# Patient Record
Sex: Female | Born: 1940
Health system: Southern US, Community
[De-identification: ages and names within clinical notes are randomized; demographics above are authoritative.]

## PROBLEM LIST (undated history)

## (undated) DIAGNOSIS — G43909 Migraine, unspecified, not intractable, without status migrainosus: Secondary | ICD-10-CM

## (undated) DIAGNOSIS — E039 Hypothyroidism, unspecified: Secondary | ICD-10-CM

## (undated) DIAGNOSIS — Z7901 Long term (current) use of anticoagulants: Secondary | ICD-10-CM

## (undated) DIAGNOSIS — F329 Major depressive disorder, single episode, unspecified: Secondary | ICD-10-CM

## (undated) DIAGNOSIS — K219 Gastro-esophageal reflux disease without esophagitis: Secondary | ICD-10-CM

## (undated) DIAGNOSIS — I351 Nonrheumatic aortic (valve) insufficiency: Secondary | ICD-10-CM

## (undated) DIAGNOSIS — F32A Depression, unspecified: Secondary | ICD-10-CM

## (undated) DIAGNOSIS — E669 Obesity, unspecified: Secondary | ICD-10-CM

## (undated) DIAGNOSIS — E785 Hyperlipidemia, unspecified: Secondary | ICD-10-CM

## (undated) DIAGNOSIS — Z9889 Other specified postprocedural states: Secondary | ICD-10-CM

## (undated) DIAGNOSIS — I5189 Other ill-defined heart diseases: Secondary | ICD-10-CM

## (undated) DIAGNOSIS — K635 Polyp of colon: Secondary | ICD-10-CM

## (undated) DIAGNOSIS — I10 Essential (primary) hypertension: Secondary | ICD-10-CM

## (undated) DIAGNOSIS — I480 Paroxysmal atrial fibrillation: Secondary | ICD-10-CM

## (undated) DIAGNOSIS — M1990 Unspecified osteoarthritis, unspecified site: Secondary | ICD-10-CM

## (undated) DIAGNOSIS — R296 Repeated falls: Secondary | ICD-10-CM

## (undated) HISTORY — DX: Obesity, unspecified: E66.9

## (undated) HISTORY — DX: Paroxysmal atrial fibrillation: I48.0

## (undated) HISTORY — DX: Hypothyroidism, unspecified: E03.9

## (undated) HISTORY — PX: TUBAL LIGATION: SHX77

## (undated) HISTORY — DX: Unspecified osteoarthritis, unspecified site: M19.90

## (undated) HISTORY — DX: Major depressive disorder, single episode, unspecified: F32.9

## (undated) HISTORY — DX: Polyp of colon: K63.5

## (undated) HISTORY — DX: Hyperlipidemia, unspecified: E78.5

## (undated) HISTORY — DX: Other specified postprocedural states: Z98.890

## (undated) HISTORY — DX: Gastro-esophageal reflux disease without esophagitis: K21.9

## (undated) HISTORY — DX: Other ill-defined heart diseases: I51.89

## (undated) HISTORY — PX: ABDOMINAL HYSTERECTOMY: SHX81

## (undated) HISTORY — DX: Nonrheumatic aortic (valve) insufficiency: I35.1

## (undated) HISTORY — DX: Long term (current) use of anticoagulants: Z79.01

## (undated) HISTORY — DX: Migraine, unspecified, not intractable, without status migrainosus: G43.909

## (undated) HISTORY — DX: Essential (primary) hypertension: I10

## (undated) HISTORY — DX: Depression, unspecified: F32.A

## (undated) HISTORY — PX: REPLACEMENT TOTAL KNEE: SUR1224

## (undated) HISTORY — PX: APPENDECTOMY: SHX54

---

## 1997-10-08 ENCOUNTER — Other Ambulatory Visit: Admission: RE | Admit: 1997-10-08 | Discharge: 1997-10-08 | Payer: Self-pay | Admitting: Obstetrics and Gynecology

## 1997-11-10 ENCOUNTER — Inpatient Hospital Stay (HOSPITAL_COMMUNITY): Admission: RE | Admit: 1997-11-10 | Discharge: 1997-11-13 | Payer: Self-pay | Admitting: Obstetrics and Gynecology

## 1998-04-30 ENCOUNTER — Other Ambulatory Visit: Admission: RE | Admit: 1998-04-30 | Discharge: 1998-04-30 | Payer: Self-pay | Admitting: Obstetrics and Gynecology

## 1998-08-25 ENCOUNTER — Other Ambulatory Visit: Admission: RE | Admit: 1998-08-25 | Discharge: 1998-08-25 | Payer: Self-pay | Admitting: Obstetrics and Gynecology

## 1998-11-12 ENCOUNTER — Other Ambulatory Visit: Admission: RE | Admit: 1998-11-12 | Discharge: 1998-11-12 | Payer: Self-pay | Admitting: Obstetrics and Gynecology

## 1999-03-10 ENCOUNTER — Other Ambulatory Visit: Admission: RE | Admit: 1999-03-10 | Discharge: 1999-03-10 | Payer: Self-pay | Admitting: Obstetrics and Gynecology

## 1999-07-21 ENCOUNTER — Other Ambulatory Visit: Admission: RE | Admit: 1999-07-21 | Discharge: 1999-07-21 | Payer: Self-pay | Admitting: Obstetrics and Gynecology

## 1999-11-18 ENCOUNTER — Other Ambulatory Visit: Admission: RE | Admit: 1999-11-18 | Discharge: 1999-11-18 | Payer: Self-pay | Admitting: Obstetrics and Gynecology

## 2000-05-31 ENCOUNTER — Other Ambulatory Visit: Admission: RE | Admit: 2000-05-31 | Discharge: 2000-05-31 | Payer: Self-pay | Admitting: Obstetrics and Gynecology

## 2000-06-13 ENCOUNTER — Ambulatory Visit (HOSPITAL_COMMUNITY): Admission: RE | Admit: 2000-06-13 | Discharge: 2000-06-13 | Payer: Self-pay | Admitting: Gastroenterology

## 2000-11-22 ENCOUNTER — Other Ambulatory Visit: Admission: RE | Admit: 2000-11-22 | Discharge: 2000-11-22 | Payer: Self-pay | Admitting: Obstetrics and Gynecology

## 2001-04-25 DIAGNOSIS — Z9889 Other specified postprocedural states: Secondary | ICD-10-CM

## 2001-04-25 HISTORY — DX: Other specified postprocedural states: Z98.890

## 2001-12-18 ENCOUNTER — Other Ambulatory Visit: Admission: RE | Admit: 2001-12-18 | Discharge: 2001-12-18 | Payer: Self-pay | Admitting: Obstetrics and Gynecology

## 2002-01-21 ENCOUNTER — Ambulatory Visit (HOSPITAL_COMMUNITY): Admission: RE | Admit: 2002-01-21 | Discharge: 2002-01-21 | Payer: Self-pay | Admitting: *Deleted

## 2003-01-02 ENCOUNTER — Other Ambulatory Visit: Admission: RE | Admit: 2003-01-02 | Discharge: 2003-01-02 | Payer: Self-pay | Admitting: Obstetrics and Gynecology

## 2005-04-25 HISTORY — PX: US ECHOCARDIOGRAPHY: HXRAD669

## 2005-09-06 ENCOUNTER — Ambulatory Visit: Payer: Self-pay | Admitting: Internal Medicine

## 2005-09-09 ENCOUNTER — Ambulatory Visit: Payer: Self-pay | Admitting: Internal Medicine

## 2005-09-16 ENCOUNTER — Ambulatory Visit: Payer: Self-pay | Admitting: Cardiology

## 2005-09-20 ENCOUNTER — Emergency Department (HOSPITAL_COMMUNITY): Admission: EM | Admit: 2005-09-20 | Discharge: 2005-09-20 | Payer: Self-pay | Admitting: Emergency Medicine

## 2005-09-21 ENCOUNTER — Ambulatory Visit (HOSPITAL_COMMUNITY): Admission: RE | Admit: 2005-09-21 | Discharge: 2005-09-21 | Payer: Self-pay | Admitting: Internal Medicine

## 2005-09-28 ENCOUNTER — Ambulatory Visit: Payer: Self-pay | Admitting: Gastroenterology

## 2005-10-12 ENCOUNTER — Ambulatory Visit: Payer: Self-pay | Admitting: Internal Medicine

## 2005-11-08 ENCOUNTER — Encounter (INDEPENDENT_AMBULATORY_CARE_PROVIDER_SITE_OTHER): Payer: Self-pay | Admitting: *Deleted

## 2005-11-08 ENCOUNTER — Ambulatory Visit: Payer: Self-pay | Admitting: Gastroenterology

## 2005-11-15 ENCOUNTER — Ambulatory Visit (HOSPITAL_BASED_OUTPATIENT_CLINIC_OR_DEPARTMENT_OTHER): Admission: RE | Admit: 2005-11-15 | Discharge: 2005-11-15 | Payer: Self-pay | Admitting: Otolaryngology

## 2005-11-15 ENCOUNTER — Encounter (INDEPENDENT_AMBULATORY_CARE_PROVIDER_SITE_OTHER): Payer: Self-pay | Admitting: *Deleted

## 2005-11-21 ENCOUNTER — Ambulatory Visit: Payer: Self-pay | Admitting: Internal Medicine

## 2005-11-25 ENCOUNTER — Ambulatory Visit: Payer: Self-pay | Admitting: Cardiology

## 2005-11-25 ENCOUNTER — Ambulatory Visit: Payer: Self-pay | Admitting: Internal Medicine

## 2005-12-01 ENCOUNTER — Ambulatory Visit: Payer: Self-pay | Admitting: Cardiology

## 2005-12-01 ENCOUNTER — Encounter: Payer: Self-pay | Admitting: Internal Medicine

## 2005-12-01 ENCOUNTER — Ambulatory Visit: Payer: Self-pay

## 2005-12-08 ENCOUNTER — Ambulatory Visit: Payer: Self-pay | Admitting: Cardiology

## 2005-12-08 ENCOUNTER — Ambulatory Visit: Payer: Self-pay | Admitting: Internal Medicine

## 2005-12-15 ENCOUNTER — Ambulatory Visit: Payer: Self-pay | Admitting: Cardiology

## 2005-12-21 ENCOUNTER — Ambulatory Visit: Payer: Self-pay | Admitting: Internal Medicine

## 2005-12-29 ENCOUNTER — Ambulatory Visit: Payer: Self-pay | Admitting: Cardiology

## 2006-01-11 ENCOUNTER — Ambulatory Visit: Payer: Self-pay | Admitting: Cardiology

## 2006-01-26 ENCOUNTER — Ambulatory Visit: Payer: Self-pay | Admitting: Cardiology

## 2006-02-09 ENCOUNTER — Ambulatory Visit: Payer: Self-pay | Admitting: Internal Medicine

## 2006-03-09 ENCOUNTER — Ambulatory Visit: Payer: Self-pay | Admitting: Cardiology

## 2006-03-22 ENCOUNTER — Ambulatory Visit: Payer: Self-pay | Admitting: Cardiovascular Disease

## 2006-04-21 ENCOUNTER — Ambulatory Visit: Payer: Self-pay | Admitting: Cardiology

## 2006-05-05 ENCOUNTER — Ambulatory Visit: Payer: Self-pay | Admitting: Internal Medicine

## 2006-05-26 ENCOUNTER — Ambulatory Visit: Payer: Self-pay | Admitting: Internal Medicine

## 2006-06-16 ENCOUNTER — Ambulatory Visit: Payer: Self-pay | Admitting: Internal Medicine

## 2006-06-22 ENCOUNTER — Ambulatory Visit: Payer: Self-pay | Admitting: Internal Medicine

## 2006-06-22 LAB — CONVERTED CEMR LAB
ALT: 32 units/L (ref 0–40)
AST: 30 units/L (ref 0–37)
Albumin: 3.8 g/dL (ref 3.5–5.2)
Alkaline Phosphatase: 92 units/L (ref 39–117)
BUN: 12 mg/dL (ref 6–23)
Basophils Absolute: 0 10*3/uL (ref 0.0–0.1)
Basophils Relative: 0.5 % (ref 0.0–1.0)
Bilirubin, Direct: 0.1 mg/dL (ref 0.0–0.3)
CO2: 30 meq/L (ref 19–32)
Cholesterol: 145 mg/dL (ref 0–200)
Creatinine, Ser: 0.8 mg/dL (ref 0.4–1.2)
Eosinophils Absolute: 0.2 10*3/uL (ref 0.0–0.6)
Eosinophils Relative: 1.9 % (ref 0.0–5.0)
GFR calc Af Amer: 92 mL/min
GFR calc non Af Amer: 76 mL/min
Glucose, Bld: 114 mg/dL — ABNORMAL HIGH (ref 70–99)
HCT: 37.8 % (ref 36.0–46.0)
HDL: 62.6 mg/dL (ref >39.0)
Hemoglobin: 13.1 g/dL (ref 12.0–15.0)
LDL Cholesterol: 65 mg/dL (ref 0–99)
Lymphocytes Relative: 7.1 % — ABNORMAL LOW (ref 12.0–46.0)
MCHC: 34.7 g/dL (ref 30.0–36.0)
MCV: 88.2 fL (ref 78.0–100.0)
Monocytes Absolute: 0.9 10*3/uL — ABNORMAL HIGH (ref 0.2–0.7)
Neutro Abs: 6.6 10*3/uL (ref 1.4–7.7)
Neutrophils Relative %: 80.2 % — ABNORMAL HIGH (ref 43.0–77.0)
Platelets: 333 10*3/uL (ref 150–400)
Potassium: 3.5 meq/L (ref 3.5–5.1)
RBC: 4.28 M/uL (ref 3.87–5.11)
RDW: 13.8 % (ref 11.5–14.6)
Sodium: 134 meq/L — ABNORMAL LOW (ref 135–145)
TSH: 1.37 microintl units/mL (ref 0.35–5.50)
Total Bilirubin: 0.6 mg/dL (ref 0.3–1.2)
Triglycerides: 85 mg/dL (ref 0–149)
VLDL: 17 mg/dL (ref 0–40)
WBC: 8.3 10*3/uL (ref 4.5–10.5)

## 2006-06-27 ENCOUNTER — Emergency Department (HOSPITAL_COMMUNITY): Admission: EM | Admit: 2006-06-27 | Discharge: 2006-06-27 | Payer: Self-pay | Admitting: Emergency Medicine

## 2006-06-29 ENCOUNTER — Encounter: Admission: RE | Admit: 2006-06-29 | Discharge: 2006-06-29 | Payer: Self-pay | Admitting: Orthopedic Surgery

## 2006-07-04 ENCOUNTER — Ambulatory Visit (HOSPITAL_BASED_OUTPATIENT_CLINIC_OR_DEPARTMENT_OTHER): Admission: RE | Admit: 2006-07-04 | Discharge: 2006-07-04 | Payer: Self-pay | Admitting: Orthopedic Surgery

## 2006-07-13 ENCOUNTER — Ambulatory Visit: Payer: Self-pay | Admitting: Cardiology

## 2006-07-14 ENCOUNTER — Encounter: Payer: Self-pay | Admitting: Vascular Surgery

## 2006-07-14 ENCOUNTER — Ambulatory Visit: Payer: Self-pay | Admitting: Vascular Surgery

## 2006-07-14 ENCOUNTER — Ambulatory Visit: Admission: RE | Admit: 2006-07-14 | Discharge: 2006-07-14 | Payer: Self-pay | Admitting: Orthopedic Surgery

## 2006-08-09 ENCOUNTER — Ambulatory Visit: Payer: Self-pay | Admitting: *Deleted

## 2006-08-23 ENCOUNTER — Ambulatory Visit: Payer: Self-pay | Admitting: Internal Medicine

## 2006-09-20 ENCOUNTER — Ambulatory Visit: Payer: Self-pay | Admitting: Cardiology

## 2006-09-27 ENCOUNTER — Ambulatory Visit (HOSPITAL_COMMUNITY): Admission: RE | Admit: 2006-09-27 | Discharge: 2006-09-27 | Payer: Self-pay | Admitting: Internal Medicine

## 2006-10-02 ENCOUNTER — Ambulatory Visit: Payer: Self-pay | Admitting: Internal Medicine

## 2006-10-17 ENCOUNTER — Ambulatory Visit: Payer: Self-pay | Admitting: Cardiology

## 2006-10-31 ENCOUNTER — Ambulatory Visit: Payer: Self-pay | Admitting: Cardiology

## 2006-11-17 ENCOUNTER — Ambulatory Visit: Payer: Self-pay | Admitting: Cardiology

## 2006-12-06 ENCOUNTER — Encounter: Payer: Self-pay | Admitting: Internal Medicine

## 2006-12-06 DIAGNOSIS — J309 Allergic rhinitis, unspecified: Secondary | ICD-10-CM

## 2006-12-06 DIAGNOSIS — J4520 Mild intermittent asthma, uncomplicated: Secondary | ICD-10-CM

## 2006-12-06 DIAGNOSIS — Z860100 Personal history of colon polyps, unspecified: Secondary | ICD-10-CM | POA: Insufficient documentation

## 2006-12-06 DIAGNOSIS — I10 Essential (primary) hypertension: Secondary | ICD-10-CM | POA: Insufficient documentation

## 2006-12-06 DIAGNOSIS — M1990 Unspecified osteoarthritis, unspecified site: Secondary | ICD-10-CM | POA: Insufficient documentation

## 2006-12-06 DIAGNOSIS — K219 Gastro-esophageal reflux disease without esophagitis: Secondary | ICD-10-CM | POA: Insufficient documentation

## 2006-12-06 DIAGNOSIS — F32A Depression, unspecified: Secondary | ICD-10-CM | POA: Insufficient documentation

## 2006-12-06 DIAGNOSIS — E039 Hypothyroidism, unspecified: Secondary | ICD-10-CM

## 2006-12-06 DIAGNOSIS — F329 Major depressive disorder, single episode, unspecified: Secondary | ICD-10-CM

## 2006-12-06 DIAGNOSIS — I359 Nonrheumatic aortic valve disorder, unspecified: Secondary | ICD-10-CM

## 2006-12-06 DIAGNOSIS — I4891 Unspecified atrial fibrillation: Secondary | ICD-10-CM | POA: Insufficient documentation

## 2006-12-13 ENCOUNTER — Ambulatory Visit: Payer: Self-pay | Admitting: Cardiology

## 2006-12-13 LAB — CONVERTED CEMR LAB
ALT: 21 units/L (ref 0–35)
AST: 22 units/L (ref 0–37)
Albumin: 3.6 g/dL (ref 3.5–5.2)
Alkaline Phosphatase: 95 units/L (ref 39–117)
BUN: 13 mg/dL (ref 6–23)
Basophils Absolute: 0.1 10*3/uL (ref 0.0–0.1)
Basophils Relative: 1.7 % — ABNORMAL HIGH (ref 0.0–1.0)
Bilirubin, Direct: 0.1 mg/dL (ref 0.0–0.3)
CO2: 30 meq/L (ref 19–32)
Chloride: 103 meq/L (ref 96–112)
Cholesterol: 160 mg/dL (ref 0–200)
Creatinine, Ser: 0.8 mg/dL (ref 0.4–1.2)
Eosinophils Absolute: 0.3 10*3/uL (ref 0.0–0.6)
Eosinophils Relative: 4.6 % (ref 0.0–5.0)
GFR calc Af Amer: 92 mL/min
GFR calc non Af Amer: 76 mL/min
Glucose, Bld: 91 mg/dL (ref 70–99)
HCT: 37.1 % (ref 36.0–46.0)
HDL: 61.5 mg/dL (ref >39.0)
Hemoglobin: 13 g/dL (ref 12.0–15.0)
LDL Cholesterol: 77 mg/dL (ref 0–99)
Lymphocytes Relative: 28.8 % (ref 12.0–46.0)
MCHC: 34.9 g/dL (ref 30.0–36.0)
MCV: 87.7 fL (ref 78.0–100.0)
Monocytes Absolute: 0.7 10*3/uL (ref 0.2–0.7)
Monocytes Relative: 9 % (ref 3.0–11.0)
Neutro Abs: 4.2 10*3/uL (ref 1.4–7.7)
Neutrophils Relative %: 55.9 % (ref 43.0–77.0)
Platelets: 355 10*3/uL (ref 150–400)
Potassium: 3.5 meq/L (ref 3.5–5.1)
RBC: 4.23 M/uL (ref 3.87–5.11)
Sodium: 139 meq/L (ref 135–145)
Total Bilirubin: 0.8 mg/dL (ref 0.3–1.2)
Total CHOL/HDL Ratio: 2.6
Total Protein: 6.9 g/dL (ref 6.0–8.3)
Triglycerides: 110 mg/dL (ref 0–149)
VLDL: 22 mg/dL (ref 0–40)
WBC: 7.5 10*3/uL (ref 4.5–10.5)

## 2007-01-10 ENCOUNTER — Ambulatory Visit: Payer: Self-pay | Admitting: Internal Medicine

## 2007-02-07 ENCOUNTER — Ambulatory Visit: Payer: Self-pay | Admitting: Cardiovascular Disease

## 2007-02-28 ENCOUNTER — Ambulatory Visit: Payer: Self-pay | Admitting: Cardiology

## 2007-03-12 ENCOUNTER — Encounter: Payer: Self-pay | Admitting: Internal Medicine

## 2007-03-28 ENCOUNTER — Ambulatory Visit: Payer: Self-pay | Admitting: Internal Medicine

## 2007-04-16 ENCOUNTER — Ambulatory Visit: Payer: Self-pay | Admitting: Cardiology

## 2007-05-07 ENCOUNTER — Ambulatory Visit: Payer: Self-pay | Admitting: Cardiology

## 2007-05-21 ENCOUNTER — Ambulatory Visit: Payer: Self-pay | Admitting: Cardiology

## 2007-05-24 ENCOUNTER — Encounter: Payer: Self-pay | Admitting: Internal Medicine

## 2007-06-01 ENCOUNTER — Encounter: Payer: Self-pay | Admitting: Internal Medicine

## 2007-06-07 ENCOUNTER — Encounter: Payer: Self-pay | Admitting: Internal Medicine

## 2007-06-13 ENCOUNTER — Ambulatory Visit: Payer: Self-pay | Admitting: Cardiology

## 2007-06-13 LAB — CONVERTED CEMR LAB
INR: 5.4 — ABNORMAL HIGH (ref 0.8–1.0)
Prothrombin Time: 30.1 s (ref 10.9–13.3)

## 2007-06-20 ENCOUNTER — Encounter: Payer: Self-pay | Admitting: Internal Medicine

## 2007-06-28 ENCOUNTER — Ambulatory Visit: Payer: Self-pay | Admitting: Cardiovascular Disease

## 2007-07-24 ENCOUNTER — Ambulatory Visit: Payer: Self-pay | Admitting: Internal Medicine

## 2007-08-01 ENCOUNTER — Ambulatory Visit: Payer: Self-pay | Admitting: Cardiovascular Disease

## 2007-08-15 ENCOUNTER — Ambulatory Visit: Payer: Self-pay | Admitting: Cardiology

## 2007-08-29 ENCOUNTER — Ambulatory Visit: Payer: Self-pay | Admitting: Internal Medicine

## 2007-08-29 ENCOUNTER — Encounter: Payer: Self-pay | Admitting: Internal Medicine

## 2007-08-30 ENCOUNTER — Ambulatory Visit: Payer: Self-pay | Admitting: Internal Medicine

## 2007-08-30 DIAGNOSIS — R5383 Other fatigue: Secondary | ICD-10-CM

## 2007-08-30 DIAGNOSIS — E785 Hyperlipidemia, unspecified: Secondary | ICD-10-CM | POA: Insufficient documentation

## 2007-08-30 DIAGNOSIS — R5381 Other malaise: Secondary | ICD-10-CM

## 2007-08-30 LAB — CONVERTED CEMR LAB
ALT: 30 U/L
AST: 25 U/L
Albumin: 3.8 g/dL
Alkaline Phosphatase: 79 U/L
BUN: 17 mg/dL
Basophils Absolute: 0 K/uL
Basophils Relative: 0 %
Bilirubin, Direct: 0.1 mg/dL
CO2: 29 meq/L
Calcium: 9.2 mg/dL
Chloride: 102 meq/L
Cholesterol: 172 mg/dL
Creatinine, Ser: 0.8 mg/dL
Eosinophils Absolute: 0 K/uL
Eosinophils Relative: 0.2 %
GFR calc Af Amer: 92 mL/min
GFR calc non Af Amer: 76 mL/min
Glucose, Bld: 112 mg/dL — ABNORMAL HIGH
HCT: 38.3 %
HDL: 68.7 mg/dL
Hemoglobin: 12.9 g/dL
LDL Cholesterol: 86 mg/dL
Lymphocytes Relative: 22.5 %
MCHC: 33.6 g/dL
MCV: 90 fL
Monocytes Absolute: 0.3 K/uL
Monocytes Relative: 2.2 % — ABNORMAL LOW
Neutro Abs: 8.6 K/uL — ABNORMAL HIGH
Neutrophils Relative %: 75.1 %
Platelets: 375 K/uL
Potassium: 3.6 meq/L
RBC: 4.25 M/uL
RDW: 14.3 %
Sodium: 138 meq/L
TSH: 2.64 u[IU]/mL
Total Bilirubin: 0.6 mg/dL
Total CHOL/HDL Ratio: 2.5
Total Protein: 7.1 g/dL
Triglycerides: 86 mg/dL
VLDL: 17 mg/dL
WBC: 11.5 10*3/microliter — ABNORMAL HIGH

## 2007-08-31 ENCOUNTER — Ambulatory Visit: Payer: Self-pay | Admitting: Internal Medicine

## 2007-08-31 ENCOUNTER — Encounter: Payer: Self-pay | Admitting: Internal Medicine

## 2007-09-03 ENCOUNTER — Ambulatory Visit: Payer: Self-pay | Admitting: Internal Medicine

## 2007-09-03 LAB — CONVERTED CEMR LAB
Basophils Relative: 0.7 % (ref 0.0–1.0)
Eosinophils Absolute: 0.3 10*3/uL (ref 0.0–0.7)
Eosinophils Relative: 2.4 % (ref 0.0–5.0)
HCT: 33.4 % — ABNORMAL LOW (ref 36.0–46.0)
Hemoglobin: 11.2 g/dL — ABNORMAL LOW (ref 12.0–15.0)
Lymphocytes Relative: 19.5 % (ref 12.0–46.0)
MCHC: 33.5 g/dL (ref 30.0–36.0)
MCV: 90.6 fL (ref 78.0–100.0)
Monocytes Absolute: 1.1 10*3/uL — ABNORMAL HIGH (ref 0.1–1.0)
Monocytes Relative: 9.6 % (ref 3.0–12.0)
Neutro Abs: 7.9 10*3/uL — ABNORMAL HIGH (ref 1.4–7.7)
Neutrophils Relative %: 67.8 % (ref 43.0–77.0)
RBC: 3.68 M/uL — ABNORMAL LOW (ref 3.87–5.11)
WBC: 11.7 10*3/uL — ABNORMAL HIGH (ref 4.5–10.5)

## 2007-09-05 ENCOUNTER — Telehealth (INDEPENDENT_AMBULATORY_CARE_PROVIDER_SITE_OTHER): Payer: Self-pay | Admitting: *Deleted

## 2007-09-12 ENCOUNTER — Ambulatory Visit: Payer: Self-pay | Admitting: Cardiology

## 2007-09-19 ENCOUNTER — Ambulatory Visit: Payer: Self-pay | Admitting: Cardiology

## 2007-09-24 ENCOUNTER — Ambulatory Visit: Payer: Self-pay | Admitting: Cardiovascular Disease

## 2007-09-27 ENCOUNTER — Telehealth: Payer: Self-pay | Admitting: Internal Medicine

## 2007-09-28 ENCOUNTER — Encounter: Payer: Self-pay | Admitting: Internal Medicine

## 2007-10-08 ENCOUNTER — Ambulatory Visit: Payer: Self-pay | Admitting: Internal Medicine

## 2007-10-09 ENCOUNTER — Encounter: Payer: Self-pay | Admitting: Internal Medicine

## 2007-10-22 ENCOUNTER — Ambulatory Visit: Payer: Self-pay | Admitting: Cardiology

## 2007-10-31 ENCOUNTER — Ambulatory Visit: Payer: Self-pay | Admitting: Cardiology

## 2007-11-05 ENCOUNTER — Ambulatory Visit: Payer: Self-pay | Admitting: Cardiology

## 2007-11-05 LAB — CONVERTED CEMR LAB
INR: 5.2 — ABNORMAL HIGH (ref 0.8–1.0)
Prothrombin Time: 50.9 s — ABNORMAL HIGH (ref 10.9–13.3)

## 2007-11-16 ENCOUNTER — Inpatient Hospital Stay (HOSPITAL_COMMUNITY): Admission: RE | Admit: 2007-11-16 | Discharge: 2007-11-20 | Payer: Self-pay | Admitting: Orthopedic Surgery

## 2007-11-21 ENCOUNTER — Telehealth (INDEPENDENT_AMBULATORY_CARE_PROVIDER_SITE_OTHER): Payer: Self-pay | Admitting: *Deleted

## 2007-11-21 ENCOUNTER — Ambulatory Visit: Payer: Self-pay | Admitting: Internal Medicine

## 2007-11-21 ENCOUNTER — Inpatient Hospital Stay (HOSPITAL_COMMUNITY): Admission: EM | Admit: 2007-11-21 | Discharge: 2007-11-25 | Payer: Self-pay | Admitting: Emergency Medicine

## 2007-11-29 ENCOUNTER — Encounter: Payer: Self-pay | Admitting: Internal Medicine

## 2007-11-30 ENCOUNTER — Ambulatory Visit: Payer: Self-pay | Admitting: Internal Medicine

## 2007-12-06 ENCOUNTER — Encounter: Payer: Self-pay | Admitting: Internal Medicine

## 2007-12-12 ENCOUNTER — Ambulatory Visit: Payer: Self-pay | Admitting: Cardiology

## 2007-12-21 ENCOUNTER — Ambulatory Visit: Payer: Self-pay | Admitting: Cardiology

## 2007-12-28 ENCOUNTER — Ambulatory Visit: Payer: Self-pay | Admitting: Cardiovascular Disease

## 2008-01-11 ENCOUNTER — Ambulatory Visit: Payer: Self-pay | Admitting: Cardiology

## 2008-01-15 ENCOUNTER — Telehealth: Payer: Self-pay | Admitting: Internal Medicine

## 2008-01-21 ENCOUNTER — Ambulatory Visit: Payer: Self-pay | Admitting: Internal Medicine

## 2008-01-30 ENCOUNTER — Ambulatory Visit: Payer: Self-pay | Admitting: Internal Medicine

## 2008-02-04 ENCOUNTER — Encounter: Payer: Self-pay | Admitting: Internal Medicine

## 2008-02-04 ENCOUNTER — Ambulatory Visit (HOSPITAL_COMMUNITY): Admission: RE | Admit: 2008-02-04 | Discharge: 2008-02-04 | Payer: Self-pay | Admitting: Internal Medicine

## 2008-02-06 ENCOUNTER — Encounter: Payer: Self-pay | Admitting: Internal Medicine

## 2008-02-18 ENCOUNTER — Ambulatory Visit: Payer: Self-pay | Admitting: Internal Medicine

## 2008-02-18 DIAGNOSIS — R609 Edema, unspecified: Secondary | ICD-10-CM

## 2008-02-22 ENCOUNTER — Encounter: Payer: Self-pay | Admitting: Internal Medicine

## 2008-02-22 LAB — CONVERTED CEMR LAB
AST: 24 units/L (ref 0–37)
Bacteria, UA: NEGATIVE
Basophils Absolute: 0.1 10*3/uL (ref 0.0–0.1)
Basophils Relative: 0.8 % (ref 0.0–3.0)
Bilirubin Urine: NEGATIVE
Bilirubin, Direct: 0.1 mg/dL (ref 0.0–0.3)
CO2: 30 meq/L (ref 19–32)
Calcium: 9.1 mg/dL (ref 8.4–10.5)
Cholesterol: 159 mg/dL (ref 0–200)
Creatinine, Ser: 0.7 mg/dL (ref 0.4–1.2)
Crystals: NEGATIVE
Eosinophils Relative: 5.9 % — ABNORMAL HIGH (ref 0.0–5.0)
GFR calc Af Amer: 107 mL/min
GFR calc non Af Amer: 88 mL/min
Glucose, Bld: 112 mg/dL — ABNORMAL HIGH (ref 70–99)
HDL: 59.5 mg/dL (ref >39.0)
Hemoglobin, Urine: NEGATIVE
Hemoglobin: 12.2 g/dL (ref 12.0–15.0)
Hgb A1c MFr Bld: 5.9 % (ref 4.6–6.0)
Ketones, ur: NEGATIVE mg/dL
LDL Cholesterol: 84 mg/dL (ref 0–99)
Leukocytes, UA: NEGATIVE
Lymphocytes Relative: 26.6 % (ref 12.0–46.0)
MCHC: 33.9 g/dL (ref 30.0–36.0)
MCV: 90 fL (ref 78.0–100.0)
Monocytes Absolute: 0.6 10*3/uL (ref 0.1–1.0)
Monocytes Relative: 7.7 % (ref 3.0–12.0)
Mucus, UA: NEGATIVE
Neutro Abs: 4.4 10*3/uL (ref 1.4–7.7)
Neutrophils Relative %: 59 % (ref 43.0–77.0)
Nitrite: NEGATIVE
Platelets: 345 10*3/uL (ref 150–400)
Potassium: 3.7 meq/L (ref 3.5–5.1)
RBC: 3.98 M/uL (ref 3.87–5.11)
RDW: 14.7 % — ABNORMAL HIGH (ref 11.5–14.6)
Sodium: 139 meq/L (ref 135–145)
Squamous Epithelial / HPF: NEGATIVE /lpf
TSH: 3.52 microintl units/mL (ref 0.35–5.50)
Total Bilirubin: 0.6 mg/dL (ref 0.3–1.2)
Total CHOL/HDL Ratio: 2.7
Total Protein, Urine: NEGATIVE mg/dL
Triglycerides: 79 mg/dL (ref 0–149)
Urine Glucose: NEGATIVE mg/dL
VLDL: 16 mg/dL (ref 0–40)
pH: 6.5 (ref 5.0–8.0)

## 2008-02-27 ENCOUNTER — Ambulatory Visit: Payer: Self-pay | Admitting: Cardiovascular Disease

## 2008-03-26 ENCOUNTER — Ambulatory Visit: Payer: Self-pay | Admitting: Internal Medicine

## 2008-04-23 ENCOUNTER — Ambulatory Visit: Payer: Self-pay | Admitting: Internal Medicine

## 2008-05-15 ENCOUNTER — Ambulatory Visit: Payer: Self-pay | Admitting: Cardiology

## 2008-05-21 ENCOUNTER — Ambulatory Visit: Payer: Self-pay | Admitting: Cardiology

## 2008-06-18 ENCOUNTER — Ambulatory Visit: Payer: Self-pay | Admitting: Cardiology

## 2008-07-08 ENCOUNTER — Ambulatory Visit: Payer: Self-pay | Admitting: Internal Medicine

## 2008-07-08 DIAGNOSIS — J0100 Acute maxillary sinusitis, unspecified: Secondary | ICD-10-CM

## 2008-07-08 LAB — CONVERTED CEMR LAB
ALT: 23 units/L (ref 0–35)
AST: 22 units/L (ref 0–37)
Albumin: 3.8 g/dL (ref 3.5–5.2)
Alkaline Phosphatase: 95 units/L (ref 39–117)
BUN: 15 mg/dL (ref 6–23)
Basophils Absolute: 0 10*3/uL (ref 0.0–0.1)
Basophils Relative: 0.2 % (ref 0.0–3.0)
Bilirubin Urine: NEGATIVE
Bilirubin, Direct: 0.1 mg/dL (ref 0.0–0.3)
CO2: 30 meq/L (ref 19–32)
Calcium: 9.3 mg/dL (ref 8.4–10.5)
Chloride: 102 meq/L (ref 96–112)
Cholesterol, target level: 200 mg/dL
Cholesterol: 174 mg/dL (ref 0–200)
Creatinine, Ser: 0.8 mg/dL (ref 0.4–1.2)
Eosinophils Absolute: 0.3 10*3/uL (ref 0.0–0.7)
Eosinophils Relative: 3.5 % (ref 0.0–5.0)
GFR calc non Af Amer: 75.41 mL/min (ref >60)
Glucose, Bld: 127 mg/dL — ABNORMAL HIGH (ref 70–99)
HCT: 37.1 % (ref 36.0–46.0)
HDL goal, serum: 40 mg/dL
HDL: 66.2 mg/dL (ref >39.00)
Hemoglobin, Urine: NEGATIVE
Hemoglobin: 12.7 g/dL (ref 12.0–15.0)
Ketones, ur: NEGATIVE mg/dL
LDL Cholesterol: 91 mg/dL (ref 0–99)
LDL Goal: 130 mg/dL
Leukocytes, UA: NEGATIVE
Lymphocytes Relative: 23.4 % (ref 12.0–46.0)
Lymphs Abs: 2 10*3/uL (ref 0.7–4.0)
MCHC: 34.2 g/dL (ref 30.0–36.0)
MCV: 89.8 fL (ref 78.0–100.0)
Magnesium: 2.3 mg/dL (ref 1.5–2.5)
Monocytes Absolute: 0.6 10*3/uL (ref 0.1–1.0)
Monocytes Relative: 6.6 % (ref 3.0–12.0)
Neutro Abs: 5.5 10*3/uL (ref 1.4–7.7)
Neutrophils Relative %: 66.3 % (ref 43.0–77.0)
Nitrite: NEGATIVE
Platelets: 335 10*3/uL (ref 150.0–400.0)
Potassium: 3.7 meq/L (ref 3.5–5.1)
RBC: 4.13 M/uL (ref 3.87–5.11)
RDW: 13.6 % (ref 11.5–14.6)
Sodium: 139 meq/L (ref 135–145)
Specific Gravity, Urine: 1.01 (ref 1.000–1.030)
TSH: 4.97 microintl units/mL (ref 0.35–5.50)
Total Bilirubin: 0.7 mg/dL (ref 0.3–1.2)
Total CHOL/HDL Ratio: 3
Total Protein, Urine: NEGATIVE mg/dL
Total Protein: 7.2 g/dL (ref 6.0–8.3)
Triglycerides: 84 mg/dL (ref 0.0–149.0)
Urine Glucose: NEGATIVE mg/dL
Urobilinogen, UA: 0.2 (ref 0.0–1.0)
VLDL: 16.8 mg/dL (ref 0.0–40.0)
WBC: 8.4 10*3/uL (ref 4.5–10.5)
pH: 7 (ref 5.0–8.0)

## 2008-07-09 ENCOUNTER — Encounter: Payer: Self-pay | Admitting: Internal Medicine

## 2008-07-16 ENCOUNTER — Ambulatory Visit: Payer: Self-pay | Admitting: Internal Medicine

## 2008-07-18 DIAGNOSIS — G43909 Migraine, unspecified, not intractable, without status migrainosus: Secondary | ICD-10-CM | POA: Insufficient documentation

## 2008-08-06 ENCOUNTER — Ambulatory Visit: Payer: Self-pay | Admitting: Internal Medicine

## 2008-08-20 ENCOUNTER — Ambulatory Visit: Payer: Self-pay | Admitting: Cardiology

## 2008-09-03 ENCOUNTER — Ambulatory Visit: Payer: Self-pay | Admitting: Cardiology

## 2008-09-10 ENCOUNTER — Ambulatory Visit: Payer: Self-pay | Admitting: Internal Medicine

## 2008-09-10 LAB — CONVERTED CEMR LAB
ALT: 22 units/L (ref 0–35)
Alkaline Phosphatase: 103 units/L (ref 39–117)
BUN: 15 mg/dL (ref 6–23)
Bilirubin Urine: NEGATIVE
Bilirubin, Direct: 0.1 mg/dL (ref 0.0–0.3)
CO2: 30 meq/L (ref 19–32)
Chloride: 107 meq/L (ref 96–112)
Creatinine, Ser: 0.8 mg/dL (ref 0.4–1.2)
Eosinophils Relative: 4.4 % (ref 0.0–5.0)
Glucose, Bld: 108 mg/dL — ABNORMAL HIGH (ref 70–99)
HCT: 37.4 % (ref 36.0–46.0)
Hemoglobin, Urine: NEGATIVE
Hemoglobin: 12.9 g/dL (ref 12.0–15.0)
Hgb A1c MFr Bld: 6.2 % (ref 4.6–6.5)
Leukocytes, UA: NEGATIVE
Lymphocytes Relative: 25.1 % (ref 12.0–46.0)
MCV: 88.8 fL (ref 78.0–100.0)
Monocytes Absolute: 0.4 10*3/uL (ref 0.1–1.0)
Monocytes Relative: 4.5 % (ref 3.0–12.0)
Neutrophils Relative %: 66 % (ref 43.0–77.0)
Nitrite: NEGATIVE
Platelets: 295 10*3/uL (ref 150.0–400.0)
Potassium: 3.6 meq/L (ref 3.5–5.1)
RBC: 4.21 M/uL (ref 3.87–5.11)
Sodium: 141 meq/L (ref 135–145)
Specific Gravity, Urine: <=1.005 (ref 1.000–1.030)
TSH: 3 microintl units/mL (ref 0.35–5.50)
Total Bilirubin: 0.5 mg/dL (ref 0.3–1.2)
Total Protein, Urine: NEGATIVE mg/dL
Total Protein: 7.3 g/dL (ref 6.0–8.3)
Urine Glucose: NEGATIVE mg/dL
Urobilinogen, UA: 0.2 (ref 0.0–1.0)
WBC: 8.5 10*3/uL (ref 4.5–10.5)
pH: 6 (ref 5.0–8.0)

## 2008-09-17 ENCOUNTER — Ambulatory Visit: Payer: Self-pay | Admitting: Internal Medicine

## 2008-09-17 LAB — CONVERTED CEMR LAB
INR: 5.4 (ref 0.8–1.0)
Prothrombin Time: 53 s (ref 10.9–13.3)

## 2008-09-23 ENCOUNTER — Encounter: Payer: Self-pay | Admitting: *Deleted

## 2008-10-01 ENCOUNTER — Telehealth: Payer: Self-pay | Admitting: Internal Medicine

## 2008-10-01 ENCOUNTER — Ambulatory Visit: Payer: Self-pay | Admitting: Cardiology

## 2008-10-01 LAB — CONVERTED CEMR LAB
POC INR: 2.2
Protime: 18.2

## 2008-10-22 ENCOUNTER — Encounter (INDEPENDENT_AMBULATORY_CARE_PROVIDER_SITE_OTHER): Payer: Self-pay | Admitting: Pharmacist

## 2008-10-22 ENCOUNTER — Ambulatory Visit: Payer: Self-pay | Admitting: Internal Medicine

## 2008-10-22 LAB — CONVERTED CEMR LAB: Prothrombin Time: 17.8 s

## 2008-10-29 ENCOUNTER — Encounter: Payer: Self-pay | Admitting: *Deleted

## 2008-11-19 ENCOUNTER — Ambulatory Visit: Payer: Self-pay | Admitting: Cardiology

## 2008-11-19 LAB — CONVERTED CEMR LAB
POC INR: 2.3
Prothrombin Time: 18.4 s

## 2008-11-24 ENCOUNTER — Ambulatory Visit: Payer: Self-pay | Admitting: Internal Medicine

## 2008-12-17 ENCOUNTER — Ambulatory Visit: Payer: Self-pay | Admitting: Internal Medicine

## 2008-12-17 LAB — CONVERTED CEMR LAB: POC INR: 3.9

## 2009-01-07 ENCOUNTER — Ambulatory Visit: Payer: Self-pay | Admitting: Internal Medicine

## 2009-01-07 LAB — CONVERTED CEMR LAB: POC INR: 2.6

## 2009-01-27 ENCOUNTER — Telehealth: Payer: Self-pay | Admitting: Internal Medicine

## 2009-02-04 ENCOUNTER — Ambulatory Visit: Payer: Self-pay | Admitting: Internal Medicine

## 2009-02-09 ENCOUNTER — Ambulatory Visit (HOSPITAL_COMMUNITY): Admission: RE | Admit: 2009-02-09 | Discharge: 2009-02-09 | Payer: Self-pay | Admitting: Internal Medicine

## 2009-03-04 ENCOUNTER — Ambulatory Visit: Payer: Self-pay | Admitting: Internal Medicine

## 2009-03-04 LAB — CONVERTED CEMR LAB: POC INR: 2.3

## 2009-04-01 ENCOUNTER — Ambulatory Visit: Payer: Self-pay | Admitting: Cardiology

## 2009-04-01 LAB — CONVERTED CEMR LAB: POC INR: 2.6

## 2009-04-29 ENCOUNTER — Ambulatory Visit: Payer: Self-pay | Admitting: Cardiovascular Disease

## 2009-05-19 ENCOUNTER — Encounter (INDEPENDENT_AMBULATORY_CARE_PROVIDER_SITE_OTHER): Payer: Self-pay | Admitting: *Deleted

## 2009-06-02 ENCOUNTER — Ambulatory Visit: Payer: Self-pay | Admitting: Cardiology

## 2009-06-18 ENCOUNTER — Ambulatory Visit: Payer: Self-pay | Admitting: Cardiology

## 2009-06-23 ENCOUNTER — Encounter (INDEPENDENT_AMBULATORY_CARE_PROVIDER_SITE_OTHER): Payer: Self-pay | Admitting: Cardiology

## 2009-06-23 ENCOUNTER — Ambulatory Visit: Payer: Self-pay | Admitting: Cardiology

## 2009-07-01 ENCOUNTER — Ambulatory Visit: Payer: Self-pay | Admitting: Internal Medicine

## 2009-07-01 LAB — CONVERTED CEMR LAB
ALT: 25 units/L (ref 0–35)
Albumin: 4 g/dL (ref 3.5–5.2)
BUN: 12 mg/dL (ref 6–23)
Basophils Relative: 0.6 % (ref 0.0–3.0)
Chloride: 106 meq/L (ref 96–112)
Cholesterol: 168 mg/dL (ref 0–200)
Eosinophils Relative: 5.3 % — ABNORMAL HIGH (ref 0.0–5.0)
HCT: 39.3 % (ref 36.0–46.0)
LDL Goal: 160 mg/dL
Lymphs Abs: 2 10*3/uL (ref 0.7–4.0)
MCV: 91.4 fL (ref 78.0–100.0)
Monocytes Absolute: 0.6 10*3/uL (ref 0.1–1.0)
Platelets: 302 10*3/uL (ref 150.0–400.0)
Potassium: 3.7 meq/L (ref 3.5–5.1)
RBC: 4.3 M/uL (ref 3.87–5.11)
TSH: 4.06 microintl units/mL (ref 0.35–5.50)
Total Protein: 7.7 g/dL (ref 6.0–8.3)
WBC: 8.7 10*3/uL (ref 4.5–10.5)

## 2009-07-02 ENCOUNTER — Encounter: Payer: Self-pay | Admitting: Internal Medicine

## 2009-07-21 ENCOUNTER — Ambulatory Visit: Payer: Self-pay | Admitting: Internal Medicine

## 2009-08-18 ENCOUNTER — Ambulatory Visit: Payer: Self-pay | Admitting: Cardiovascular Disease

## 2009-08-18 LAB — CONVERTED CEMR LAB: POC INR: 2.6

## 2009-09-15 ENCOUNTER — Ambulatory Visit: Payer: Self-pay | Admitting: Internal Medicine

## 2009-09-15 LAB — CONVERTED CEMR LAB: POC INR: 3

## 2009-10-13 ENCOUNTER — Ambulatory Visit: Payer: Self-pay | Admitting: Cardiology

## 2009-10-20 ENCOUNTER — Telehealth: Payer: Self-pay | Admitting: Internal Medicine

## 2009-11-10 ENCOUNTER — Ambulatory Visit: Payer: Self-pay | Admitting: Internal Medicine

## 2009-11-10 LAB — CONVERTED CEMR LAB: POC INR: 1.8

## 2009-12-08 ENCOUNTER — Ambulatory Visit: Payer: Self-pay | Admitting: Cardiology

## 2009-12-08 LAB — CONVERTED CEMR LAB: POC INR: 2

## 2010-01-06 ENCOUNTER — Ambulatory Visit: Payer: Self-pay | Admitting: Internal Medicine

## 2010-01-26 ENCOUNTER — Encounter: Payer: Self-pay | Admitting: Internal Medicine

## 2010-01-26 ENCOUNTER — Ambulatory Visit: Payer: Self-pay | Admitting: Internal Medicine

## 2010-02-02 ENCOUNTER — Ambulatory Visit: Payer: Self-pay | Admitting: Cardiology

## 2010-02-10 ENCOUNTER — Ambulatory Visit (HOSPITAL_COMMUNITY): Admission: RE | Admit: 2010-02-10 | Discharge: 2010-02-10 | Payer: Self-pay | Admitting: Internal Medicine

## 2010-02-10 LAB — HM MAMMOGRAPHY: HM Mammogram: NORMAL

## 2010-02-23 ENCOUNTER — Ambulatory Visit: Payer: Self-pay | Admitting: Cardiovascular Disease

## 2010-03-23 ENCOUNTER — Ambulatory Visit: Payer: Self-pay | Admitting: Cardiology

## 2010-04-20 ENCOUNTER — Ambulatory Visit: Payer: Self-pay | Admitting: Internal Medicine

## 2010-05-17 ENCOUNTER — Encounter: Payer: Self-pay | Admitting: Internal Medicine

## 2010-05-18 ENCOUNTER — Ambulatory Visit: Admission: RE | Admit: 2010-05-18 | Discharge: 2010-05-18 | Payer: Self-pay | Source: Home / Self Care

## 2010-05-27 NOTE — Medication Information (Signed)
Summary: [name] [name]   Anticoagulant Therapy  Managed by: Weston Brass, PharmD Referring MD: Olga Millers MD PCP: Etta Grandchild MD Supervising MD: Johney Frame MD, Fayrene Fearing Indication 1: Atrial Fibrillation (ICD-427.31) Lab Used: [facility] Site: [facility] INR POC 2.7 INR RANGE 2 - 3  Dietary changes: yes       Details: Less greens  Health status changes: no    Bleeding/hemorrhagic complications: no    Recent/future hospitalizations: no    Any changes in medication regimen? no    Recent/future dental: no  Any missed doses?: no       Is patient compliant with meds? yes       Allergies: 1)  ! Pcn 2)  ! Tetracycline 3)  ! Ceclor 4)  ! Zithromax 5)  ! Trovan 6)  ! Cephalexin 7)  ! * Clindamycin 8)  ! Ibuprofen 9)  ! * Simvastatin 10)  ! Pravachol 11)  ! Sulfa 12)  ! Levaquin (Levofloxacin)  Anticoagulation Management History:      The patient is taking warfarin and comes in today for a routine follow up visit.  Positive risk factors for bleeding include an age of 72 years or older.  Negative risk factors for bleeding include no history of CVA/TIA.  The bleeding index is 'intermediate risk'.  Positive CHADS2 values include History of HTN.  Negative CHADS2 values include Age > 59 years old, History of Diabetes, and Prior Stroke/CVA/TIA.  The start date was 11/15/2005.  Her last INR was 5.4 ratio.  Anticoagulation responsible Antania Hoefling: Allred MD, Fayrene Fearing.  INR POC: 2.7.  Cuvette Lot#: [id number].  Exp: 02/2011.    Anticoagulation Management Assessment/Plan:      The patient's current anticoagulation dose is Warfarin sodium 5 mg  tabs: Take as directed by coumadin clinic..  The target INR is 2 - 3.  The next INR is due 02/03/2010.  Anticoagulation instructions were given to patient.  Results were reviewed/authorized by Weston Brass, PharmD.  She was notified by Harrel Carina, PharmD candidate.         Prior Anticoagulation Instructions: INR 2.0  Continue taking 1.5 tablets (7.5mg )  every day except take 1 tablet (5mg ) on Mondays, Wednesdays, and Fridays.  Recheck in 4 weeks.   Current Anticoagulation Instructions: INR 2.7  Continue taking 1 1/2 tablets everyday except take 1 tablet on Mondays, Wednesdays, and Fridays. Re-check INR in 4 weeks. The patient is to hold four doses of coumadin.  The dosage to be resumed includes:

## 2010-05-27 NOTE — Progress Notes (Signed)
Summary: Refill--Omnaris  Phone Note Refill Request Message from:  Fax from Pharmacy on October 20, 2009 1:42 PM  Refills Requested: Medication #1:  OMNARIS 50 MCG/ACT SUSP 2 puffs each nostril Initial call taken by: Lucious Groves,  October 20, 2009 1:43 PM    Prescriptions: OMNARIS 50 MCG/ACT SUSP (CICLESONIDE) 2 puffs each nostril  #3 inhs x 0   Entered by:   Lucious Groves   Authorized by:   Etta Grandchild MD   Signed by:   Lucious Groves on 10/20/2009   Method used:   Faxed to ...       [facility]  [phone]* (retail)       [address] [place], [place]  [address]       Ph: [phone] or [phone]       Fax: [phone]   RxID:   [id number]

## 2010-05-27 NOTE — Letter (Signed)
Summary: Lipid Letter  [facility]  [address] [place], [place] [address]   Phone: [phone]  Fax: [phone]    07/02/2009  [name] [name] [address] [place], [place]  [address]  Dear Ms. Lozier-arps:  We have carefully reviewed your last lipid profile from 07/01/2009 and the results are noted below with a summary of recommendations for lipid management.    Cholesterol:       168     Goal: <200   HDL "good" Cholesterol:   96.29     Goal: >40   LDL "bad" Cholesterol:   76     Goal: <160   Triglycerides:       99.0     Goal: <150        TLC Diet (Therapeutic Lifestyle Change): Saturated Fats & Transfatty acids should be kept < 7% of total calories ***Reduce Saturated Fats Polyunstaurated Fat can be up to 10% of total calories Monounsaturated Fat Fat can be up to 20% of total calories Total Fat should be no greater than 25-35% of total calories Carbohydrates should be 50-60% of total calories Protein should be approximately 15% of total calories Fiber should be at least 20-30 grams a day ***Increased fiber may help lower LDL Total Cholesterol should be < 200mg /day Consider adding plant stanol/sterols to diet (example: Benacol spread) ***A higher intake of unsaturated fat may reduce Triglycerides and Increase HDL    Adjunctive Measures (may lower LIPIDS and reduce risk of Heart Attack) include: Aerobic Exercise (20-30 minutes 3-4 times a week) Limit Alcohol Consumption Weight Reduction Aspirin 75-81 mg a day by mouth (if not allergic or contraindicated) Dietary Fiber 20-30 grams a day by mouth     Current Medications: 1)    Cartia Xt 240 Mg  Cp24 (Diltiazem hcl coated beads) .Marland Kitchen.. 1 by mouth qd 2)    Advair Diskus 500-50 Mcg/dose  Misc (Fluticasone-salmeterol) .Marland Kitchen.. 1 puff bid 3)    Warfarin Sodium 5 Mg  Tabs (Warfarin sodium) .... Take as directed by coumadin clinic. 4)    Levothyroxine Sodium 25 Mcg Tabs (Levothyroxine sodium) .Marland Kitchen..  1 by mouth qd 5)    Nexium 40 Mg Cpdr (Esomeprazole magnesium) .... Take 1  by mouth bid 6)    Triamterene-hctz 37.5-25 Mg Caps (Triamterene-hctz) .Marland Kitchen.. 1 capsule by mouth once a day 7)    Glucosamine Chondr 1500 Complx   Caps (Glucosamine-chondroit-vit c-mn) .Marland Kitchen.. 1 by mouth bid 8)    Oscal 500/200 D-3 500-200 Mg-unit  Tabs (Calcium-vitamin d) .Marland Kitchen.. 1 by mouth bid 9)    Norco 5-325 Mg Tabs (Hydrocodone-acetaminophen) .Marland Kitchen.. 1 by mouth two times a day as needed for pain 10)    Astepro 0.15 % Soln (Azelastine hcl) .... Two times a day 11)    Crestor 10 Mg Tabs (Rosuvastatin calcium) .... One by mouth once daily 12)    Coenzyme Q10 200 Mg Caps (Coenzyme q10) .... Take 200 mg by mouth daily. 13)    Omnaris 50 Mcg/act Susp (Ciclesonide) .... 2 puffs each nostril 14)    Avelox 400 Mg Tabs (Moxifloxacin hcl) .... One by mouth once daily for 14 days 15)    Claritin 10 Mg Tabs (Loratadine) .... As needed 16)    Ventolin Hfa 108 (90 Base) Mcg/act Aers (Albuterol sulfate) .Marland Kitchen.. 1-2 puffs qid as needed for wheezing  If you have any questions, please call. We appreciate being able to work with you.   Sincerely,    [facility] Etta Grandchild MD

## 2010-05-27 NOTE — Medication Information (Signed)
Summary: [name] [name]   Anticoagulant Therapy  Managed by: Weston Brass, PharmD Referring MD: Olga Millers MD PCP: Etta Grandchild MD Supervising MD: Patty Sermons Indication 1: Atrial Fibrillation (ICD-427.31) Lab Used: [facility] Site: [facility] INR POC 2.5 INR RANGE 2 - 3  Dietary changes: no    Health status changes: yes       Details: Asthma flare typical for this time of year  Bleeding/hemorrhagic complications: no    Recent/future hospitalizations: no    Any changes in medication regimen? yes       Details: Robaxin for pain x2  Recent/future dental: no  Any missed doses?: no       Is patient compliant with meds? yes       Allergies: 1)  ! Pcn 2)  ! Tetracycline 3)  ! Ceclor 4)  ! Zithromax 5)  ! Trovan 6)  ! Cephalexin 7)  ! * Clindamycin 8)  ! Ibuprofen 9)  ! * Simvastatin 10)  ! Pravachol 11)  ! Sulfa 12)  ! Levaquin (Levofloxacin)  Anticoagulation Management History:      The patient is taking warfarin and comes in today for a routine follow up visit.  Positive risk factors for bleeding include an age of 56 years or older.  Negative risk factors for bleeding include no history of CVA/TIA.  The bleeding index is 'intermediate risk'.  Positive CHADS2 values include History of HTN.  Negative CHADS2 values include Age > 76 years old, History of Diabetes, and Prior Stroke/CVA/TIA.  The start date was 11/15/2005.  Her last INR was 5.4 ratio.  Anticoagulation responsible Kyel Purk: Brackbill.  INR POC: 2.5.  Cuvette Lot#: [id number].  Exp: 03/2011.    Anticoagulation Management Assessment/Plan:      The patient's current anticoagulation dose is Warfarin sodium 5 mg  tabs: Take as directed by coumadin clinic..  The target INR is 2 - 3.  The next INR is due 04/20/2010.  Anticoagulation instructions were given to patient.  Results were reviewed/authorized by Weston Brass, PharmD.  She was notified by Hoy Register, PharmD Candd[place].         Prior Anticoagulation  Instructions: INR 2.1  Continue on same dosage 1.5 tablets daily except 1 tablet on Mondays, Wednesdays, and Fridays.  Recheck in 4 weeks.    Current Anticoagulation Instructions: INR 2.5 Continue previous dose of 7.5 mg everyday except 5 mg on Monday, Wednesday, and Friday Recheck INR in 4 weeks

## 2010-05-27 NOTE — Medication Information (Signed)
Summary: ccr/jss  Anticoagulant Therapy  Managed by: Cloyde Reams, RN, BSN Referring MD: Olga Millers MD PCP: Etta Grandchild MD Supervising MD: Riley Kill MD, Maisie Fus Indication 1: Atrial Fibrillation (ICD-427.31) Lab Used: [facility] Site: [facility] INR POC 3.2 INR RANGE 2 - 3  Dietary changes: no    Health status changes: no    Bleeding/hemorrhagic complications: no    Recent/future hospitalizations: no    Any changes in medication regimen? no    Recent/future dental: no  Any missed doses?: no       Is patient compliant with meds? yes       Allergies: 1)  ! Pcn 2)  ! Tetracycline 3)  ! Ceclor 4)  ! Zithromax 5)  ! Trovan 6)  ! Cephalexin 7)  ! * Clindamycin 8)  ! Ibuprofen 9)  ! * Simvastatin 10)  ! Pravachol 11)  ! Sulfa  Anticoagulation Management History:      The patient is taking warfarin and comes in today for a routine follow up visit.  Positive risk factors for bleeding include an age of 72 years or older.  Negative risk factors for bleeding include no history of CVA/TIA.  The bleeding index is 'intermediate risk'.  Positive CHADS2 values include History of HTN.  Negative CHADS2 values include Age > 44 years old, History of Diabetes, and Prior Stroke/CVA/TIA.  The start date was 11/15/2005.  Her last INR was 5.4 ratio.  Anticoagulation responsible provider: Riley Kill MD, Maisie Fus.  INR POC: 3.2.  Cuvette Lot#: [id number].  Exp: 07/2010.    Anticoagulation Management Assessment/Plan:      The patient's current anticoagulation dose is Warfarin sodium 5 mg  tabs: Take as directed by coumadin clinic..  The target INR is 2 - 3.  The next INR is due 06/23/2009.  Anticoagulation instructions were given to patient.  Results were reviewed/authorized by Cloyde Reams, RN, BSN.  She was notified by Bethena Midget, RN, BSN.         Prior Anticoagulation Instructions: INR 3.0  Take 1 tablet tonight only then continue taking 1.5 tablets daily except 1 tablet Mondays and  Fridays. Recheck in 4 weeks.  Current Anticoagulation Instructions: INR 3.2  Start taking 1.5 tablets daily except 1 tablet on Mondays, Wednesdays, and Fridays.  Recheck in 3 weeks.

## 2010-05-27 NOTE — Medication Information (Signed)
Summary: [name] [name]  Anticoagulant Therapy  Managed by: Weston Brass, PharmD Referring MD: Olga Millers MD PCP: Etta Grandchild MD Supervising MD: Graciela Husbands MD, Viviann Spare Indication 1: Atrial Fibrillation (ICD-427.31) Lab Used: [facility] Site: [facility] INR POC 2.6 INR RANGE 2 - 3  Dietary changes: no    Health status changes: no    Bleeding/hemorrhagic complications: no    Recent/future hospitalizations: no    Any changes in medication regimen? yes       Details: Robutussin CF at least once daily   Recent/future dental: no  Any missed doses?: no       Is patient compliant with meds? yes       Allergies: 1)  ! Pcn 2)  ! Tetracycline 3)  ! Ceclor 4)  ! Zithromax 5)  ! Trovan 6)  ! Cephalexin 7)  ! * Clindamycin 8)  ! Ibuprofen 9)  ! * Simvastatin 10)  ! Pravachol 11)  ! Sulfa 12)  ! Levaquin (Levofloxacin)  Anticoagulation Management History:      The patient is taking warfarin and comes in today for a routine follow up visit.  Positive risk factors for bleeding include an age of 5 years or older.  Negative risk factors for bleeding include no history of CVA/TIA.  The bleeding index is 'intermediate risk'.  Positive CHADS2 values include History of HTN.  Negative CHADS2 values include Age > 46 years old, History of Diabetes, and Prior Stroke/CVA/TIA.  The start date was 11/15/2005.  Her last INR was 5.4 ratio.  Anticoagulation responsible provider: Graciela Husbands MD, Viviann Spare.  INR POC: 2.6.  Cuvette Lot#: [id number].  Exp: 04/2011.    Anticoagulation Management Assessment/Plan:      The patient's current anticoagulation dose is Warfarin sodium 5 mg  tabs: Take as directed by coumadin clinic..  The target INR is 2 - 3.  The next INR is due 06/15/2010.  Anticoagulation instructions were given to patient.  Results were reviewed/authorized by Weston Brass, PharmD.  She was notified by Stephannie Peters, PharmD Candidate .         Prior Anticoagulation Instructions: Cont with current  regimen Return to clinic on Jan 24th, at 1000  Current Anticoagulation Instructions: INR 2.6  Coumadin 5 mg tablets - Continue 1.5 tablets every day except 1 tablet on Mondays, Wednesdays and Fridays

## 2010-05-27 NOTE — Letter (Signed)
Summary: Results Follow-up Letter  [facility]  [address] [place], [place] [address]   Phone: [phone]  Fax: [phone]    07/02/2009  [address] [place], [place]  [address]  Dear Ms. LOZIER-ARPS,   The following are the results of your recent test(s):  Test     Result     Kidney/liver   normal CBC       normal Thyroid     normal Blood sugar     103   _________________________________________________________  Please call for an appointment as directed _________________________________________________________ _________________________________________________________ _________________________________________________________  Sincerely,  Sanda Linger MD [facility]

## 2010-05-27 NOTE — Medication Information (Signed)
Summary: rov/ewj   Anticoagulant Therapy  Managed by: Weston Brass, PharmD Referring MD: Olga Millers MD PCP: Etta Grandchild MD Supervising MD: Myrtis Ser MD, Tinnie Gens Indication 1: Atrial Fibrillation (ICD-427.31) Lab Used: [facility] Site: [facility] INR POC 2.0 INR RANGE 2 - 3  Dietary changes: no    Health status changes: no    Bleeding/hemorrhagic complications: no    Recent/future hospitalizations: no    Any changes in medication regimen? no    Recent/future dental: no  Any missed doses?: no       Is patient compliant with meds? yes       Allergies: 1)  ! Pcn 2)  ! Tetracycline 3)  ! Ceclor 4)  ! Zithromax 5)  ! Trovan 6)  ! Cephalexin 7)  ! * Clindamycin 8)  ! Ibuprofen 9)  ! * Simvastatin 10)  ! Pravachol 11)  ! Sulfa 12)  ! Levaquin (Levofloxacin)  Anticoagulation Management History:      The patient is taking warfarin and comes in today for a routine follow up visit.  Positive risk factors for bleeding include an age of 72 years or older.  Negative risk factors for bleeding include no history of CVA/TIA.  The bleeding index is 'intermediate risk'.  Positive CHADS2 values include History of HTN.  Negative CHADS2 values include Age > 61 years old, History of Diabetes, and Prior Stroke/CVA/TIA.  The start date was 11/15/2005.  Her last INR was 5.4 ratio.  Anticoagulation responsible Claborn Janusz: Myrtis Ser MD, Tinnie Gens.  INR POC: 2.0.  Cuvette Lot#: [id number].  Exp: 01/2011.    Anticoagulation Management Assessment/Plan:      The patient's current anticoagulation dose is Warfarin sodium 5 mg  tabs: Take as directed by coumadin clinic..  The target INR is 2 - 3.  The next INR is due 01/06/2010.  Anticoagulation instructions were given to patient.  Results were reviewed/authorized by Weston Brass, PharmD.  She was notified by Gweneth Fritter, PharmD Candidate.         Prior Anticoagulation Instructions: INR 1.8  Take 10mg  today, then resume same dosage 7.5mg  daily except 5mg  on  MOndays, Wednesdays, and Fridays.  Recheck in 3-4 weeks.     Current Anticoagulation Instructions: INR 2.0  Continue taking 1.5 tablets (7.5mg ) every day except take 1 tablet (5mg ) on Mondays, Wednesdays, and Fridays.  Recheck in 4 weeks.

## 2010-05-27 NOTE — Medication Information (Signed)
Summary: [email or web address].mp  Anticoagulant Therapy  Managed by: Cloyde Reams, RN, BSN Referring MD: Olga Millers MD PCP: Etta Grandchild MD Supervising MD: Gala Romney MD, Reuel Boom Indication 1: Atrial Fibrillation (ICD-427.31) Lab Used: [facility] Site: [facility] INR POC 2.3 INR RANGE 2 - 3  Dietary changes: no    Health status changes: no    Bleeding/hemorrhagic complications: yes       Details: Scant amt of hemmroidal bleeding.    Recent/future hospitalizations: no    Any changes in medication regimen? no    Recent/future dental: no  Any missed doses?: no       Is patient compliant with meds? yes       Allergies (verified): 1)  ! Pcn 2)  ! Tetracycline 3)  ! Ceclor 4)  ! Zithromax 5)  ! Trovan 6)  ! Cephalexin 7)  ! * Clindamycin 8)  ! Ibuprofen 9)  ! * Simvastatin 10)  ! Pravachol 11)  ! Sulfa 12)  ! Levaquin (Levofloxacin)  Anticoagulation Management History:      The patient is taking warfarin and comes in today for a routine follow up visit.  Positive risk factors for bleeding include an age of 72 years or older.  Negative risk factors for bleeding include no history of CVA/TIA.  The bleeding index is 'intermediate risk'.  Positive CHADS2 values include History of HTN.  Negative CHADS2 values include Age > 27 years old, History of Diabetes, and Prior Stroke/CVA/TIA.  The start date was 11/15/2005.  Her last INR was 5.4 ratio.  Anticoagulation responsible provider: Bronnie Vasseur MD, Reuel Boom.  INR POC: 2.3.  Cuvette Lot#: [id number].  Exp: 08/2010.    Anticoagulation Management Assessment/Plan:      The patient's current anticoagulation dose is Warfarin sodium 5 mg  tabs: Take as directed by coumadin clinic..  The target INR is 2 - 3.  The next INR is due 08/18/2009.  Anticoagulation instructions were given to patient.  Results were reviewed/authorized by Cloyde Reams, RN, BSN.  She was notified by Cloyde Reams RN.         Prior Anticoagulation Instructions: INR  2.2  Continue 1 tab Monday, Wednesday, Friday and 1.5 tabs on all other days.  Recheck in 4 weeks.   Current Anticoagulation Instructions: INR 2.3  Continue on same dosage 1.5 tablets daily except 1 tablet on Mondays, Wednesdays, and Fridays.  Recheck in 4 weeks.

## 2010-05-27 NOTE — Medication Information (Signed)
Summary: rov/ewj  Anticoagulant Therapy  Managed by: Shelby Dubin, PharmD Referring MD: Olga Millers MD PCP: Etta Grandchild MD Supervising MD: Clifton James MD, Cristal Deer Indication 1: Atrial Fibrillation (ICD-427.31) Lab Used: [facility] Site: [facility] INR POC 3.0 INR RANGE 2 - 3  Dietary changes: no    Health status changes: no    Bleeding/hemorrhagic complications: no    Recent/future hospitalizations: no    Any changes in medication regimen? no    Recent/future dental: no  Any missed doses?: no       Is patient compliant with meds? yes       Allergies (verified): 1)  ! Pcn 2)  ! Tetracycline 3)  ! Ceclor 4)  ! Zithromax 5)  ! Trovan 6)  ! Cephalexin 7)  ! * Clindamycin 8)  ! Ibuprofen 9)  ! * Simvastatin 10)  ! Pravachol 11)  ! Sulfa  Anticoagulation Management History:      The patient is taking warfarin and comes in today for a routine follow up visit.  Positive risk factors for bleeding include an age of 72 years or older.  Negative risk factors for bleeding include no history of CVA/TIA.  The bleeding index is 'intermediate risk'.  Positive CHADS2 values include History of HTN.  Negative CHADS2 values include Age > 72 years old, History of Diabetes, and Prior Stroke/CVA/TIA.  The start date was 11/15/2005.  Her last INR was 5.4 ratio.  Anticoagulation responsible provider: Clifton James MD, Cristal Deer.  INR POC: 3.0.  Cuvette Lot#: [id number].  Exp: 05/2010.    Anticoagulation Management Assessment/Plan:      The patient's current anticoagulation dose is Warfarin sodium 5 mg  tabs: Take as directed by coumadin clinic..  The target INR is 2 - 3.  The next INR is due 05/27/2009.  Anticoagulation instructions were given to patient.  Results were reviewed/authorized by Shelby Dubin, PharmD.  She was notified by Lew Dawes, PharmD Candidate.         Prior Anticoagulation Instructions: INR 2.6  Continue on same dosage 1.5 tablets daily except 1 tablet on Mondays  and Fridays.  Recheck in 4 weeks.    Current Anticoagulation Instructions: INR 3.0  Take 1 tablet tonight only then continue taking 1.5 tablets daily except 1 tablet Mondays and Fridays. Recheck in 4 weeks.

## 2010-05-27 NOTE — Medication Information (Signed)
Summary: rov/ewj  Anticoagulant Therapy  Managed by: Bethena Midget, RN, BSN Referring MD: Olga Millers MD PCP: Etta Grandchild MD Supervising MD: Eden Emms MD, Theron Arista Indication 1: Atrial Fibrillation (ICD-427.31) Lab Used: [facility] Site: [facility] INR POC 2.6 INR RANGE 2 - 3  Dietary changes: no    Health status changes: no    Bleeding/hemorrhagic complications: no    Recent/future hospitalizations: no    Any changes in medication regimen? no    Recent/future dental: no  Any missed doses?: no       Is patient compliant with meds? yes       Allergies: 1)  ! Pcn 2)  ! Tetracycline 3)  ! Ceclor 4)  ! Zithromax 5)  ! Trovan 6)  ! Cephalexin 7)  ! * Clindamycin 8)  ! Ibuprofen 9)  ! * Simvastatin 10)  ! Pravachol 11)  ! Sulfa 12)  ! Levaquin (Levofloxacin)  Anticoagulation Management History:      The patient is taking warfarin and comes in today for a routine follow up visit.  Positive risk factors for bleeding include an age of 33 years or older.  Negative risk factors for bleeding include no history of CVA/TIA.  The bleeding index is 'intermediate risk'.  Positive CHADS2 values include History of HTN.  Negative CHADS2 values include Age > 12 years old, History of Diabetes, and Prior Stroke/CVA/TIA.  The start date was 11/15/2005.  Her last INR was 5.4 ratio.  Anticoagulation responsible provider: Eden Emms MD, Theron Arista.  INR POC: 2.6.  Cuvette Lot#: [id number].  Exp: 09/2010.    Anticoagulation Management Assessment/Plan:      The patient's current anticoagulation dose is Warfarin sodium 5 mg  tabs: Take as directed by coumadin clinic..  The target INR is 2 - 3.  The next INR is due 09/15/2009.  Anticoagulation instructions were given to patient.  Results were reviewed/authorized by Bethena Midget, RN, BSN.  She was notified by Bethena Midget, RN, BSN.         Prior Anticoagulation Instructions: INR 2.3  Continue on same dosage 1.5 tablets daily except 1 tablet on Mondays,  Wednesdays, and Fridays.  Recheck in 4 weeks.    Current Anticoagulation Instructions: INR 2.6 Continue 7.5mg s daily except 5mg s on Mondays, Wednesdays and Fridays. Recheck in 4 weeks.

## 2010-05-27 NOTE — Letter (Signed)
Summary: Appointment - Missed  [facility]     [place], [place]    Phone:   Fax:      May 19, 2009 MRN: [id number]   [name] [name] [address] [place], [place]  [address]   Dear [name] [name],  Our records indicate you missed your appointment on 05-18-2009  with  Dr. Jens Som  It is very important that we reach you to reschedule this appointment. We look forward to participating in your health care needs. Please contact us at the number listed above at your earliest convenience to reschedule this appointment.     Sincerely,     Lorne Skeens  [facility] Scheduling Team

## 2010-05-27 NOTE — Medication Information (Signed)
Summary: rov/jaj  Anticoagulant Therapy  Managed by: Bethena Midget, RN, BSN Referring MD: Olga Millers MD PCP: Etta Grandchild MD Supervising MD: Juanda Chance MD, Bruce Indication 1: Atrial Fibrillation (ICD-427.31) Lab Used: [facility] Site: [facility] INR POC 3.1 INR RANGE 2 - 3  Dietary changes: no    Health status changes: no    Bleeding/hemorrhagic complications: no    Recent/future hospitalizations: no    Any changes in medication regimen? no    Recent/future dental: no  Any missed doses?: no       Is patient compliant with meds? yes       Allergies: 1)  ! Pcn 2)  ! Tetracycline 3)  ! Ceclor 4)  ! Zithromax 5)  ! Trovan 6)  ! Cephalexin 7)  ! * Clindamycin 8)  ! Ibuprofen 9)  ! * Simvastatin 10)  ! Pravachol 11)  ! Sulfa 12)  ! Levaquin (Levofloxacin)  Anticoagulation Management History:      The patient is taking warfarin and comes in today for a routine follow up visit.  Positive risk factors for bleeding include an age of 72 years or older.  Negative risk factors for bleeding include no history of CVA/TIA.  The bleeding index is 'intermediate risk'.  Positive CHADS2 values include History of HTN.  Negative CHADS2 values include Age > 34 years old, History of Diabetes, and Prior Stroke/CVA/TIA.  The start date was 11/15/2005.  Her last INR was 5.4 ratio.  Anticoagulation responsible provider: Juanda Chance MD, Smitty Cords.  INR POC: 3.1.  Cuvette Lot#: [id number].  Exp: 02/2011.    Anticoagulation Management Assessment/Plan:      The patient's current anticoagulation dose is Warfarin sodium 5 mg  tabs: Take as directed by coumadin clinic..  The target INR is 2 - 3.  The next INR is due 02/23/2010.  Anticoagulation instructions were given to patient.  Results were reviewed/authorized by Bethena Midget, RN, BSN.  She was notified by Bethena Midget, RN, BSN.         Prior Anticoagulation Instructions: INR 2.7  Continue taking 1 1/2 tablets everyday except take 1 tablet on  Mondays, Wednesdays, and Fridays. Re-check INR in 4 weeks. The patient is to hold four doses of coumadin.  The dosage to be resumed includes:   Current Anticoagulation Instructions: INR 3.1 Today take only 2.5mg  then resume 7.5mg s daily except 5mg s on Mondays, Wednesdays and Fridays. Recheck in 3 weeks.

## 2010-05-27 NOTE — Medication Information (Signed)
Summary: rov/tm  Anticoagulant Therapy  Managed by: Eda Keys, PharmD Referring MD: Olga Millers MD PCP: Etta Grandchild MD Supervising MD: Gala Romney MD, Reuel Boom Indication 1: Atrial Fibrillation (ICD-427.31) Lab Used: [facility] Site: [facility] INR POC 3.0 INR RANGE 2 - 3  Dietary changes: no    Health status changes: no    Bleeding/hemorrhagic complications: no    Recent/future hospitalizations: no    Any changes in medication regimen? no    Recent/future dental: no  Any missed doses?: no       Is patient compliant with meds? yes       Allergies: 1)  ! Pcn 2)  ! Tetracycline 3)  ! Ceclor 4)  ! Zithromax 5)  ! Trovan 6)  ! Cephalexin 7)  ! * Clindamycin 8)  ! Ibuprofen 9)  ! * Simvastatin 10)  ! Pravachol 11)  ! Sulfa 12)  ! Levaquin (Levofloxacin)  Anticoagulation Management History:      The patient is taking warfarin and comes in today for a routine follow up visit.  Positive risk factors for bleeding include an age of 72 years or older.  Negative risk factors for bleeding include no history of CVA/TIA.  The bleeding index is 'intermediate risk'.  Positive CHADS2 values include History of HTN.  Negative CHADS2 values include Age > 65 years old, History of Diabetes, and Prior Stroke/CVA/TIA.  The start date was 11/15/2005.  Her last INR was 5.4 ratio.  Anticoagulation responsible provider: Nimco Bivens MD, Reuel Boom.  INR POC: 3.0.  Cuvette Lot#: [id number].  Exp: 11/2010.    Anticoagulation Management Assessment/Plan:      The patient's current anticoagulation dose is Warfarin sodium 5 mg  tabs: Take as directed by coumadin clinic..  The target INR is 2 - 3.  The next INR is due 10/13/2009.  Anticoagulation instructions were given to patient.  Results were reviewed/authorized by Eda Keys, PharmD.  She was notified by Eda Keys.         Prior Anticoagulation Instructions: INR 2.6 Continue 7.5mg s daily except 5mg s on Mondays, Wednesdays and  Fridays. Recheck in 4 weeks.   Current Anticoagulation Instructions: INR 3.0  Continue taking 5 mg on Monday, wednesday, and Friday and 7.5 mg all other days.  Return to clinic in 4 weeks.

## 2010-05-27 NOTE — Medication Information (Signed)
Summary: rov/ewj  Anticoagulant Therapy  Managed by: Shelby Dubin, PharmD, BCPS, CPP Referring MD: Olga Millers MD PCP: Etta Grandchild MD Supervising MD: Clifton James MD, Cristal Deer Indication 1: Atrial Fibrillation (ICD-427.31) Lab Used: [facility] Site: [facility] INR POC 2.2 INR RANGE 2 - 3  Dietary changes: no    Health status changes: yes       Details: continues to have sinus infection symptoms  Bleeding/hemorrhagic complications: no    Recent/future hospitalizations: no    Any changes in medication regimen? no    Recent/future dental: no  Any missed doses?: no       Is patient compliant with meds? yes       Current Medications (verified): 1)  Cartia Xt 240 Mg  Cp24 (Diltiazem Hcl Coated Beads) .Marland Kitchen.. 1 By Mouth Qd 2)  Advair Diskus 500-50 Mcg/dose  Misc (Fluticasone-Salmeterol) .Marland Kitchen.. 1 Puff Bid 3)  Warfarin Sodium 5 Mg  Tabs (Warfarin Sodium) .... Take As Directed By Coumadin Clinic. 4)  Levothyroxine Sodium 25 Mcg Tabs (Levothyroxine Sodium) .Marland Kitchen.. 1 By Mouth Qd 5)  Nexium 40 Mg Cpdr (Esomeprazole Magnesium) .... Take 1  By Mouth Bid 6)  Proventil Hfa 108 (90 Base) Mcg/act Aers (Albuterol Sulfate) .... Inhale 2 Puff As Directed Four Times A Day Prn 7)  Triamterene-Hctz 37.5-25 Mg Caps (Triamterene-Hctz) .Marland Kitchen.. 1 Capsule By Mouth Once A Day 8)  Glucosamine Chondr 1500 Complx   Caps (Glucosamine-Chondroit-Vit C-Mn) .Marland Kitchen.. 1 By Mouth Bid 9)  Oscal 500/200 D-3 500-200 Mg-Unit  Tabs (Calcium-Vitamin D) .Marland Kitchen.. 1 By Mouth Bid 10)  Norco 5-325 Mg Tabs (Hydrocodone-Acetaminophen) .Marland Kitchen.. 1 By Mouth Two Times A Day As Needed For Pain 11)  Astepro 0.15 % Soln (Azelastine Hcl) .... Two Times A Day 12)  Crestor 10 Mg Tabs (Rosuvastatin Calcium) .... One By Mouth Once Daily 13)  Coenzyme Q10 200 Mg Caps (Coenzyme Q10) .... Take 200 Mg By Mouth Daily.  Allergies (verified): 1)  ! Pcn 2)  ! Tetracycline 3)  ! Ceclor 4)  ! Zithromax 5)  ! Trovan 6)  ! Cephalexin 7)  ! *  Clindamycin 8)  ! Ibuprofen 9)  ! * Simvastatin 10)  ! Pravachol 11)  ! Sulfa  Anticoagulation Management History:      The patient is taking warfarin and comes in today for a routine follow up visit.  Positive risk factors for bleeding include an age of 50 years or older.  Negative risk factors for bleeding include no history of CVA/TIA.  The bleeding index is 'intermediate risk'.  Positive CHADS2 values include History of HTN.  Negative CHADS2 values include Age > 29 years old, History of Diabetes, and Prior Stroke/CVA/TIA.  The start date was 11/15/2005.  Her last INR was 5.4 ratio.  Anticoagulation responsible provider: Clifton James MD, Cristal Deer.  INR POC: 2.2.  Cuvette Lot#: [id number].  Exp: 08/2010.    Anticoagulation Management Assessment/Plan:      The patient's current anticoagulation dose is Warfarin sodium 5 mg  tabs: Take as directed by coumadin clinic..  The target INR is 2 - 3.  The next INR is due 07/21/2009.  Anticoagulation instructions were given to patient.  Results were reviewed/authorized by Shelby Dubin, PharmD, BCPS, CPP.  She was notified by Shelby Dubin PharmD, BCPS, CPP.         Prior Anticoagulation Instructions: INR 3.2  Start taking 1.5 tablets daily except 1 tablet on Mondays, Wednesdays, and Fridays.  Recheck in 3 weeks.    Current Anticoagulation Instructions:  INR 2.2  Continue 1 tab Monday, Wednesday, Friday and 1.5 tabs on all other days.  Recheck in 4 weeks.

## 2010-05-27 NOTE — Medication Information (Signed)
Summary: rov/nb      Allergies Added:  Anticoagulant Therapy  Managed by: Samantha Crimes, PharmD Referring MD: Olga Millers MD PCP: Etta Grandchild MD Supervising MD: Graciela Husbands MD, Viviann Spare Indication 1: Atrial Fibrillation (ICD-427.31) Lab Used: [facility] Site: [facility] INR POC 2.1 INR RANGE 2 - 3  Dietary changes: no    Health status changes: no    Bleeding/hemorrhagic complications: no    Recent/future hospitalizations: no    Any changes in medication regimen? no    Recent/future dental: no  Any missed doses?: no       Is patient compliant with meds? yes       Current Medications (verified): 1)  Cartia Xt 240 Mg  Cp24 (Diltiazem Hcl Coated Beads) .Marland Kitchen.. 1 By Mouth Qd 2)  Advair Diskus 500-50 Mcg/dose  Misc (Fluticasone-Salmeterol) .Marland Kitchen.. 1 Puff Bid 3)  Warfarin Sodium 5 Mg  Tabs (Warfarin Sodium) .... Take As Directed By Coumadin Clinic. 4)  Levothyroxine Sodium 25 Mcg Tabs (Levothyroxine Sodium) .Marland Kitchen.. 1 By Mouth Qd 5)  Nexium 40 Mg Cpdr (Esomeprazole Magnesium) .... Take 1  By Mouth Bid 6)  Triamterene-Hctz 37.5-25 Mg Caps (Triamterene-Hctz) .Marland Kitchen.. 1 Capsule By Mouth Once A Day 7)  Glucosamine Chondr 1500 Complx   Caps (Glucosamine-Chondroit-Vit C-Mn) .Marland Kitchen.. 1 By Mouth Bid 8)  Oscal 500/200 D-3 500-200 Mg-Unit  Tabs (Calcium-Vitamin D) .Marland Kitchen.. 1 By Mouth Bid 9)  Norco 5-325 Mg Tabs (Hydrocodone-Acetaminophen) .Marland Kitchen.. 1 By Mouth Two Times A Day As Needed For Pain 10)  Astepro 0.15 % Soln (Azelastine Hcl) .... Two Times A Day 11)  Crestor 10 Mg Tabs (Rosuvastatin Calcium) .... One By Mouth Once Daily 12)  Coenzyme Q10 200 Mg Caps (Coenzyme Q10) .... Take 200 Mg By Mouth Daily. 13)  Omnaris 50 Mcg/act Susp (Ciclesonide) .... 2 Puffs Each Nostril 14)  Claritin 10 Mg Tabs (Loratadine) .... As Needed 15)  Ventolin Hfa 108 (90 Base) Mcg/act Aers (Albuterol Sulfate) .Marland Kitchen.. 1-2 Puffs Qid As Needed For Wheezing  Allergies (verified): 1)  ! Pcn 2)  ! Tetracycline 3)  ! Ceclor 4)  !  Zithromax 5)  ! Trovan 6)  ! Cephalexin 7)  ! * Clindamycin 8)  ! Ibuprofen 9)  ! * Simvastatin 10)  ! Pravachol 11)  ! Sulfa 12)  ! Levaquin (Levofloxacin)  Anticoagulation Management History:      Positive risk factors for bleeding include an age of 30 years or older.  Negative risk factors for bleeding include no history of CVA/TIA.  The bleeding index is 'intermediate risk'.  Positive CHADS2 values include History of HTN.  Negative CHADS2 values include Age > 48 years old, History of Diabetes, and Prior Stroke/CVA/TIA.  The start date was 11/15/2005.  Her last INR was 5.4 ratio.  Anticoagulation responsible Allye Hoyos: Graciela Husbands MD, Viviann Spare.  INR POC: 2.1.  Exp: 03/2011.    Anticoagulation Management Assessment/Plan:      The patient's current anticoagulation dose is Warfarin sodium 5 mg  tabs: Take as directed by coumadin clinic..  The target INR is 2 - 3.  The next INR is due 05/18/2010.  Anticoagulation instructions were given to patient.  Results were reviewed/authorized by Samantha Crimes, PharmD.         Prior Anticoagulation Instructions: INR 2.5 Continue previous dose of 7.5 mg everyday except 5 mg on Monday, Wednesday, and Friday Recheck INR in 4 weeks   Current Anticoagulation Instructions: Cont with current regimen Return to clinic on Jan 24th, at 1000

## 2010-05-27 NOTE — Medication Information (Signed)
Summary: rov/tm  Anticoagulant Therapy  Managed by: Cloyde Reams, RN, BSN Referring MD: Olga Millers MD PCP: Etta Grandchild MD Supervising MD: Graciela Husbands MD, Viviann Spare Indication 1: Atrial Fibrillation (ICD-427.31) Lab Used: [facility]  Site: [facility] INR POC 1.8 INR RANGE 2 - 3  Dietary changes: no    Health status changes: no    Bleeding/hemorrhagic complications: no    Recent/future hospitalizations: no    Any changes in medication regimen? no     Any missed doses?: yes     Details: May have missed 1 dose approx 1 week.   Is patient compliant with meds? yes       Allergies: 1)  ! Pcn 2)  ! Tetracycline 3)  ! Ceclor 4)  ! Zithromax 5)  ! Trovan 6)  ! Cephalexin 7)  ! * Clindamycin 8)  ! Ibuprofen 9)  ! * Simvastatin 10)  ! Pravachol 11)  ! Sulfa 12)  ! Levaquin (Levofloxacin)  Anticoagulation Management History:      The patient is taking warfarin and comes in today for a routine follow up visit.  Positive risk factors for bleeding include an age of 57 years or older.  Negative risk factors for bleeding include no history of CVA/TIA.  The bleeding index is 'intermediate risk'.  Positive CHADS2 values include History of HTN.  Negative CHADS2 values include Age > 60 years old, History of Diabetes, and Prior Stroke/CVA/TIA.  The start date was 11/15/2005.  Her last INR was 5.4 ratio.  Anticoagulation responsible provider: Graciela Husbands MD, Viviann Spare.  INR POC: 1.8.  Cuvette Lot#: [id number].  Exp: 01/2011.    Anticoagulation Management Assessment/Plan:      The patient's current anticoagulation dose is Warfarin sodium 5 mg  tabs: Take as directed by coumadin clinic..  The target INR is 2 - 3.  The next INR is due 12/08/2009.  Anticoagulation instructions were given to patient.  Results were reviewed/authorized by Cloyde Reams, RN, BSN.  She was notified by Cloyde Reams RN.         Prior Anticoagulation Instructions: INR 2.3 Continue 7.5mg s daily except 5mg s on Mondays,  Wednesdays, and Fridays. Recheck in 4 weeks.   Current Anticoagulation Instructions: INR 1.8  Take 10mg  today, then resume same dosage 7.5mg  daily except 5mg  on MOndays, Wednesdays, and Fridays.  Recheck in 3-4 weeks.

## 2010-05-27 NOTE — Miscellaneous (Signed)
Summary: BONE DENSITY  Clinical Lists Changes  Orders: Added new Test order of T-Bone Densitometry (77080) - Signed Added new Test order of T-Lumbar Vertebral Assessment (77082) - Signed 

## 2010-05-27 NOTE — Assessment & Plan Note (Signed)
Summary: SINUS INFECTION THAT WONT GO AWAY/CD   Vital Signs:  Patient profile:   70 year old female Height:      68 inches Weight:      217 pounds BMI:     33.11 O2 Sat:      94 % on Room air Temp:     98.0 degrees F oral Pulse rate:   68 / minute Pulse rhythm:   regular Resp:     16 per minute BP sitting:   138 / 84  (left arm) Cuff size:   large  Vitals Entered By: Bill Salinas CMA (July 01, 2009 1:53 PM)  Nutrition Counseling: Patient's BMI is greater than 25 and therefore counseled on weight management options.  O2 Flow:  Room air CC: pt here with complaint of sinus congestion/ ab, URI symptoms, Hypertension Management, Lipid Management   Primary Care Provider:  Etta Grandchild MD  CC:  pt here with complaint of sinus congestion/ ab, URI symptoms, Hypertension Management, and Lipid Management.  History of Present Illness:  URI Symptoms      This is a 70 year old woman who presents with URI symptoms.  The symptoms began 2 weeks ago.  The severity is described as moderate.  The patient reports nasal congestion, purulent nasal discharge, sore throat, and dry cough, but denies productive cough, earache, and sick contacts.  The patient denies fever, stiff neck, dyspnea, wheezing, rash, vomiting, diarrhea, use of an antipyretic, and response to antipyretic.  The patient also reports itchy throat, sneezing, seasonal symptoms, and response to antihistamine.  The patient denies headache, muscle aches, and severe fatigue.  Risk factors for Strep sinusitis include unilateral facial pain, unilateral nasal discharge, poor response to decongestant, and double sickening.  The patient denies the following risk factors for Strep sinusitis: tooth pain, Strep exposure, tender adenopathy, and absence of cough.    Asthma History    Asthma Control Assessment:    Age range: 12+ years    Symptoms: 0-2 days/week    Nighttime Awakenings: 0-2/month    Interferes w/ normal activity: no limitations  SABA use (not for EIB): >2 days/week    Asthma Control Assessment: Not Well Controlled  Hypertension History:      She denies headache, chest pain, palpitations, dyspnea with exertion, orthopnea, PND, peripheral edema, visual symptoms, neurologic problems, syncope, and side effects from treatment.  She notes no problems with any antihypertensive medication side effects.        Positive major cardiovascular risk factors include female age 72 years old or older, hyperlipidemia, and hypertension.  Negative major cardiovascular risk factors include no history of diabetes, negative family history for ischemic heart disease, and non-tobacco-user status.        Further assessment for target organ damage reveals no history of ASHD, cardiac end-organ damage (CHF/LVH), stroke/TIA, peripheral vascular disease, renal insufficiency, or hypertensive retinopathy.    Lipid Management History:      Positive NCEP/ATP III risk factors include female age 72 years old or older and hypertension.  Negative NCEP/ATP III risk factors include non-diabetic, HDL cholesterol greater than 60, no family history for ischemic heart disease, non-tobacco-user status, no ASHD (atherosclerotic heart disease), no prior stroke/TIA, no peripheral vascular disease, and no history of aortic aneurysm.        The patient states that she knows about the "Therapeutic Lifestyle Change" diet.  Her compliance with the TLC diet is not at all.  The patient expresses understanding of adjunctive measures for  cholesterol lowering.  Adjunctive measures started by the patient include fiber, ASA, limit alcohol consumpton, and weight reduction.  She expresses no side effects from her lipid-lowering medication.  The patient denies any symptoms to suggest myopathy or liver disease.       Preventive Screening-Counseling & Management  Alcohol-Tobacco     Alcohol drinks/day: <1     Smoking Status: never     Passive Smoke Exposure:  no  Hep-HIV-STD-Contraception     Hepatitis Risk: no risk noted     HIV Risk: no     STD Risk: no risk noted      Sexual History:  currently monogamous.        Drug Use:  never.        Blood Transfusions:  no.    Clinical Review Panels:  Lipid Management   Cholesterol:  174 (07/08/2008)   LDL (bad choesterol):  91 (07/08/2008)   HDL (good cholesterol):  66.20 (07/08/2008)  Diabetes Management   HgBA1C:  6.2 (09/10/2008)   Creatinine:  0.8 (09/10/2008)   Last Flu Vaccine:  Fluvax 3+ (01/21/2008)   Last Pneumovax:  Pneumovax (08/30/2007)  CBC   WBC:  8.5 (09/10/2008)   RBC:  4.21 (09/10/2008)   Hgb:  12.9 (09/10/2008)   Hct:  37.4 (09/10/2008)   Platelets:  295.0 (09/10/2008)   MCV  88.8 (09/10/2008)   MCHC  34.4 (09/10/2008)   RDW  13.7 (09/10/2008)   PMN:  66.0 (09/10/2008)   Lymphs:  25.1 (09/10/2008)   Monos:  4.5 (09/10/2008)   Eosinophils:  4.4 (09/10/2008)   Basophil:  0.0 (09/10/2008)  Complete Metabolic Panel   Glucose:  108 (09/10/2008)   Sodium:  141 (09/10/2008)   Potassium:  3.6 (09/10/2008)   Chloride:  107 (09/10/2008)   CO2:  30 (09/10/2008)   BUN:  15 (09/10/2008)   Creatinine:  0.8 (09/10/2008)   Albumin:  3.9 (09/10/2008)   Total Protein:  7.3 (09/10/2008)   Calcium:  9.3 (09/10/2008)   Total Bili:  0.5 (09/10/2008)   Alk Phos:  103 (09/10/2008)   SGPT (ALT):  22 (09/10/2008)   SGOT (AST):  22 (09/10/2008)   Medications Prior to Update: 1)  Cartia Xt 240 Mg  Cp24 (Diltiazem Hcl Coated Beads) .Marland Kitchen.. 1 By Mouth Qd 2)  Advair Diskus 500-50 Mcg/dose  Misc (Fluticasone-Salmeterol) .Marland Kitchen.. 1 Puff Bid 3)  Warfarin Sodium 5 Mg  Tabs (Warfarin Sodium) .... Take As Directed By Coumadin Clinic. 4)  Levothyroxine Sodium 25 Mcg Tabs (Levothyroxine Sodium) .Marland Kitchen.. 1 By Mouth Qd 5)  Nexium 40 Mg Cpdr (Esomeprazole Magnesium) .... Take 1  By Mouth Bid 6)  Proventil Hfa 108 (90 Base) Mcg/act Aers (Albuterol Sulfate) .... Inhale 2 Puff As Directed Four Times A  Day Prn 7)  Triamterene-Hctz 37.5-25 Mg Caps (Triamterene-Hctz) .Marland Kitchen.. 1 Capsule By Mouth Once A Day 8)  Glucosamine Chondr 1500 Complx   Caps (Glucosamine-Chondroit-Vit C-Mn) .Marland Kitchen.. 1 By Mouth Bid 9)  Oscal 500/200 D-3 500-200 Mg-Unit  Tabs (Calcium-Vitamin D) .Marland Kitchen.. 1 By Mouth Bid 10)  Norco 5-325 Mg Tabs (Hydrocodone-Acetaminophen) .Marland Kitchen.. 1 By Mouth Two Times A Day As Needed For Pain 11)  Astepro 0.15 % Soln (Azelastine Hcl) .... Two Times A Day 12)  Crestor 10 Mg Tabs (Rosuvastatin Calcium) .... One By Mouth Once Daily 13)  Coenzyme Q10 200 Mg Caps (Coenzyme Q10) .... Take 200 Mg By Mouth Daily.  Current Medications (verified): 1)  Cartia Xt 240 Mg  Cp24 (Diltiazem Hcl Coated Beads) .Marland KitchenMarland KitchenMarland Kitchen  1 By Mouth Qd 2)  Advair Diskus 500-50 Mcg/dose  Misc (Fluticasone-Salmeterol) .Marland Kitchen.. 1 Puff Bid 3)  Warfarin Sodium 5 Mg  Tabs (Warfarin Sodium) .... Take As Directed By Coumadin Clinic. 4)  Levothyroxine Sodium 25 Mcg Tabs (Levothyroxine Sodium) .Marland Kitchen.. 1 By Mouth Qd 5)  Nexium 40 Mg Cpdr (Esomeprazole Magnesium) .... Take 1  By Mouth Bid 6)  Proventil Hfa 108 (90 Base) Mcg/act Aers (Albuterol Sulfate) .... Inhale 2 Puff As Directed Four Times A Day Prn 7)  Triamterene-Hctz 37.5-25 Mg Caps (Triamterene-Hctz) .Marland Kitchen.. 1 Capsule By Mouth Once A Day 8)  Glucosamine Chondr 1500 Complx   Caps (Glucosamine-Chondroit-Vit C-Mn) .Marland Kitchen.. 1 By Mouth Bid 9)  Oscal 500/200 D-3 500-200 Mg-Unit  Tabs (Calcium-Vitamin D) .Marland Kitchen.. 1 By Mouth Bid 10)  Norco 5-325 Mg Tabs (Hydrocodone-Acetaminophen) .Marland Kitchen.. 1 By Mouth Two Times A Day As Needed For Pain 11)  Astepro 0.15 % Soln (Azelastine Hcl) .... Two Times A Day 12)  Crestor 10 Mg Tabs (Rosuvastatin Calcium) .... One By Mouth Once Daily 13)  Coenzyme Q10 200 Mg Caps (Coenzyme Q10) .... Take 200 Mg By Mouth Daily. 14)  Omnaris 50 Mcg/act Susp (Ciclesonide) .... 2 Puffs Each Nostril 15)  Avelox 400 Mg Tabs (Moxifloxacin Hcl) .... One By Mouth Once Daily For 14 Days 16)  Claritin 10 Mg Tabs  (Loratadine) .... As Needed  Allergies: 1)  ! Pcn 2)  ! Tetracycline 3)  ! Ceclor 4)  ! Zithromax 5)  ! Trovan 6)  ! Cephalexin 7)  ! * Clindamycin 8)  ! Ibuprofen 9)  ! * Simvastatin 10)  ! Pravachol 11)  ! Sulfa 12)  ! Levaquin (Levofloxacin)  Past History:  Past Medical History: Reviewed history from 06/18/2009 and no changes required. MIGRAINE HEADACHE (ICD-346.90) HYPERLIPIDEMIA (ICD-272.4) AORTIC INSUFFICIENCY, MILD (ICD-424.1) COUMADIN THERAPY (ICD-V58.61) COLONIC POLYPS, HX OF (ICD-V12.72) HYPOTHYROIDISM (ICD-244.9) GERD (ICD-530.81) OSTEOARTHRITIS (ICD-715.90) HYPERTENSION (ICD-401.9) DEPRESSION (ICD-311) ATRIAL FIBRILLATION (ICD-427.31) ASTHMA (ICD-493.90) ALLERGIC RHINITIS (ICD-477.9)    Past Surgical History: Reviewed history from 12/06/2006 and no changes required. Appendectomy Hysterectomy Tubal ligation  Social History: Hepatitis Risk:  no risk noted STD Risk:  no risk noted  Review of Systems       The patient complains of weight gain.  The patient denies anorexia, fever, weight loss, vision loss, decreased hearing, hoarseness, headaches, hemoptysis, abdominal pain, melena, hematochezia, severe indigestion/heartburn, hematuria, incontinence, difficulty walking, depression, enlarged lymph nodes, and angioedema.   Endo:  Complains of heat intolerance and weight change; denies cold intolerance, excessive hunger, excessive thirst, excessive urination, and polyuria.  Physical Exam  General:  alert, well-developed, well-nourished, well-hydrated, and overweight-appearing.   Head:  normocephalic, atraumatic, no abnormalities observed, and no abnormalities palpated.   Eyes:  vision grossly intact, pupils equal, pupils round, and pupils reactive to light.   Ears:  R ear normal and L ear normal.   Nose:  nasal dischargemucosal pallor, L maxillary sinus tenderness, and R maxillary sinus tenderness.  no airflow obstruction, no intranasal foreign body, no  nasal polyps, no nasal mucosal lesions, no mucosal friability, and no active bleeding or clots.   Mouth:  Oral mucosa and oropharynx without lesions or exudates.  Teeth in good repair. Neck:  No deformities, masses, or tenderness noted. Lungs:  Normal respiratory effort, chest expands symmetrically. Lungs are clear to auscultation, no crackles or wheezes. Heart:  Normal rate and regular rhythm. S1 and S2 normal without gallop, murmur, click, rub or other extra sounds. Abdomen:  Bowel sounds positive,abdomen soft  and non-tender without masses, organomegaly or hernias noted. Msk:  no joint tenderness, no joint swelling, no joint warmth, no redness over joints, enlarged MCP joints, enlarged PIP joints, and enlarged DIP joints.   Pulses:  R and L carotid,radial,femoral,dorsalis pedis and posterior tibial pulses are full and equal bilaterally Extremities:  No clubbing, cyanosis, edema, or deformity noted with normal full range of motion of all joints.   Neurologic:  No cranial nerve deficits noted. Station and gait are normal. Plantar reflexes are down-going bilaterally. DTRs are symmetrical throughout. Sensory, motor and coordinative functions appear intact. Skin:  Intact without suspicious lesions or rashes Cervical Nodes:  no anterior cervical adenopathy and no posterior cervical adenopathy.   Axillary Nodes:  no R axillary adenopathy and no L axillary adenopathy.   Inguinal Nodes:  no R inguinal adenopathy and no L inguinal adenopathy.   Psych:  Cognition and judgment appear intact. Alert and cooperative with normal attention span and concentration. No apparent delusions, illusions, hallucinations   Impression & Recommendations:  Problem # 1:  HYPOTHYROIDISM (ICD-244.9) Assessment Unchanged  Her updated medication list for this problem includes:    Levothyroxine Sodium 25 Mcg Tabs (Levothyroxine sodium) .Marland Kitchen... 1 by mouth qd  Orders: Venipuncture (13244) TLB-BMP (Basic Metabolic Panel-BMET)  (80048-METABOL) TLB-CBC Platelet - w/Differential (85025-CBCD) TLB-Hepatic/Liver Function Pnl (80076-HEPATIC) TLB-TSH (Thyroid Stimulating Hormone) (84443-TSH)  Problem # 2:  ACUTE MAXILLARY SINUSITIS (ICD-461.0)  Her updated medication list for this problem includes:    Astepro 0.15 % Soln (Azelastine hcl) .Marland Kitchen..Marland Kitchen Two times a day    Omnaris 50 Mcg/act Susp (Ciclesonide) .Marland Kitchen... 2 puffs each nostril    Avelox 400 Mg Tabs (Moxifloxacin hcl) ..... One by mouth once daily for 14 days  Problem # 3:  ALLERGIC RHINITIS (ICD-477.9)  Her updated medication list for this problem includes:    Astepro 0.15 % Soln (Azelastine hcl) .Marland Kitchen..Marland Kitchen Two times a day    Omnaris 50 Mcg/act Susp (Ciclesonide) .Marland Kitchen... 2 puffs each nostril    Claritin 10 Mg Tabs (Loratadine) .Marland Kitchen... As needed  Problem # 4:  HYPERLIPIDEMIA (ICD-272.4) Assessment: Unchanged  Her updated medication list for this problem includes:    Crestor 10 Mg Tabs (Rosuvastatin calcium) ..... One by mouth once daily  Labs Reviewed: SGOT: 22 (09/10/2008)   SGPT: 22 (09/10/2008)  Lipid Goals: Chol Goal: 200 (07/01/2009)   HDL Goal: 40 (07/01/2009)   LDL Goal: 160 (07/01/2009)   TG Goal: 150 (07/01/2009)  Prior 10 Yr Risk Heart Disease: 5 % (09/10/2008)   HDL:66.20 (07/08/2008), 59.5 (02/22/2008)  LDL:91 (07/08/2008), 84 (02/22/2008)  Chol:174 (07/08/2008), 159 (02/22/2008)  Trig:84.0 (07/08/2008), 79 (02/22/2008)  Problem # 5:  HYPERTENSION (ICD-401.9) Assessment: Improved  Her updated medication list for this problem includes:    Cartia Xt 240 Mg Cp24 (Diltiazem hcl coated beads) .Marland Kitchen... 1 by mouth qd    Triamterene-hctz 37.5-25 Mg Caps (Triamterene-hctz) .Marland Kitchen... 1 capsule by mouth once a day  Orders: Venipuncture (01027) TLB-BMP (Basic Metabolic Panel-BMET) (80048-METABOL) TLB-CBC Platelet - w/Differential (85025-CBCD) TLB-Hepatic/Liver Function Pnl (80076-HEPATIC) TLB-TSH (Thyroid Stimulating Hormone) (84443-TSH)  BP today: 138/84 Prior BP:  156/74 (06/18/2009)  Prior 10 Yr Risk Heart Disease: 5 % (09/10/2008)  Labs Reviewed: K+: 3.6 (09/10/2008) Creat: : 0.8 (09/10/2008)   Chol: 174 (07/08/2008)   HDL: 66.20 (07/08/2008)   LDL: 91 (07/08/2008)   TG: 84.0 (07/08/2008)  Problem # 6:  ASTHMA (ICD-493.90) Assessment: Unchanged  The following medications were removed from the medication list:    Proventil Hfa 108 (90 Base)  Mcg/act Aers (Albuterol sulfate) ..... Inhale 2 puff as directed four times a day prn Her updated medication list for this problem includes:    Advair Diskus 500-50 Mcg/dose Misc (Fluticasone-salmeterol) .Marland Kitchen... 1 puff bid    Ventolin Hfa 108 (90 Base) Mcg/act Aers (Albuterol sulfate) .Marland Kitchen... 1-2 puffs qid as needed for wheezing  Complete Medication List: 1)  Cartia Xt 240 Mg Cp24 (Diltiazem hcl coated beads) .Marland Kitchen.. 1 by mouth qd 2)  Advair Diskus 500-50 Mcg/dose Misc (Fluticasone-salmeterol) .Marland Kitchen.. 1 puff bid 3)  Warfarin Sodium 5 Mg Tabs (Warfarin sodium) .... Take as directed by coumadin clinic. 4)  Levothyroxine Sodium 25 Mcg Tabs (Levothyroxine sodium) .Marland Kitchen.. 1 by mouth qd 5)  Nexium 40 Mg Cpdr (Esomeprazole magnesium) .... Take 1  by mouth bid 6)  Triamterene-hctz 37.5-25 Mg Caps (Triamterene-hctz) .Marland Kitchen.. 1 capsule by mouth once a day 7)  Glucosamine Chondr 1500 Complx Caps (Glucosamine-chondroit-vit c-mn) .Marland Kitchen.. 1 by mouth bid 8)  Oscal 500/200 D-3 500-200 Mg-unit Tabs (Calcium-vitamin d) .Marland Kitchen.. 1 by mouth bid 9)  Norco 5-325 Mg Tabs (Hydrocodone-acetaminophen) .Marland Kitchen.. 1 by mouth two times a day as needed for pain 10)  Astepro 0.15 % Soln (Azelastine hcl) .... Two times a day 11)  Crestor 10 Mg Tabs (Rosuvastatin calcium) .... One by mouth once daily 12)  Coenzyme Q10 200 Mg Caps (Coenzyme q10) .... Take 200 mg by mouth daily. 13)  Omnaris 50 Mcg/act Susp (Ciclesonide) .... 2 puffs each nostril 14)  Avelox 400 Mg Tabs (Moxifloxacin hcl) .... One by mouth once daily for 14 days 15)  Claritin 10 Mg Tabs (Loratadine) ....  As needed 16)  Ventolin Hfa 108 (90 Base) Mcg/act Aers (Albuterol sulfate) .Marland Kitchen.. 1-2 puffs qid as needed for wheezing  Other Orders: TLB-Lipid Panel (80061-LIPID)  Hypertension Assessment/Plan:      The patient's hypertensive risk group is category B: At least one risk factor (excluding diabetes) with no target organ damage.  Her calculated 10 year risk of coronary heart disease is 5 %.  Today's blood pressure is 138/84.  Her blood pressure goal is < 140/90.  Lipid Assessment/Plan:      Based on NCEP/ATP III, the patient's risk factor category is "0-1 risk factors".  The patient's lipid goals are as follows: Total cholesterol goal is 200; LDL cholesterol goal is 160; HDL cholesterol goal is 40; Triglyceride goal is 150.    Patient Instructions: 1)  Please schedule a follow-up appointment in 1 month. 2)  It is important that you exercise regularly at least 20 minutes 5 times a week. If you develop chest pain, have severe difficulty breathing, or feel very tired , stop exercising immediately and seek medical attention. 3)  You need to lose weight. Consider a lower calorie diet and regular exercise.  4)  Check your Blood Pressure regularly. If it is above 130/80: you should make an appointment. 5)  Take your antibiotic as prescribed until ALL of it is gone, but stop if you develop a rash or swelling and contact our office as soon as possible. 6)  Acute sinusitis symptoms for less than 10 days are not helped by antibiotics.Use warm moist compresses, and over the counter decongestants ( only as directed). Call if no improvement in 5-7 days, sooner if increasing pain, fever, or new symptoms. Prescriptions: VENTOLIN HFA 108 (90 BASE) MCG/ACT AERS (ALBUTEROL SULFATE) 1-2 puffs QID as needed for wheezing  #3 inhs x 0   Entered and Authorized by:   Etta Grandchild MD  Signed by:   Etta Grandchild MD on 07/01/2009   Method used:   Samples Given   RxID:   [id number] AVELOX 400 MG TABS  (MOXIFLOXACIN HCL) one by mouth once daily for 14 days  #14 x 1   Entered and Authorized by:   Etta Grandchild MD   Signed by:   Etta Grandchild MD on 07/01/2009   Method used:   Print then Give to Patient   RxID:   [id number] OMNARIS 50 MCG/ACT SUSP (CICLESONIDE) 2 puffs each nostril  #3 inhs x 0   Entered and Authorized by:   Etta Grandchild MD   Signed by:   Etta Grandchild MD on 07/01/2009   Method used:   Samples Given   RxID:   [id number]

## 2010-05-27 NOTE — Assessment & Plan Note (Signed)
Summary: [id number]    Primary Provider:  Etta Grandchild MD  CC:  sob with exertion.  History of Present Illness: [name] [name] is a pleasant  female who has a history of paroxysmal atrial fibrillation, hypertension, and hyperlipidemia.  She had a catheterization performed by Meade Maw in October 2003.  There was no obstructive coronary disease and her ejection fraction was normal.  Her last echocardiogram was performed on December 01, 2005, which showed normal LV function.  There was no significant valvular abnormalities.  Since I last saw her in January of 2010, the patient has dyspnea with more extreme activities but not with routine activities. It is relieved with rest. It is not associated with chest pain. There is no orthopnea, PND or pedal edema. There is no syncope or palpitations. There is no exertional chest pain.   Current Medications (verified): 1)  Cartia Xt 240 Mg  Cp24 (Diltiazem Hcl Coated Beads) .Marland Kitchen.. 1 By Mouth Qd 2)  Advair Diskus 500-50 Mcg/dose  Misc (Fluticasone-Salmeterol) .Marland Kitchen.. 1 Puff Bid 3)  Warfarin Sodium 5 Mg  Tabs (Warfarin Sodium) .... Take As Directed By Coumadin Clinic. 4)  Levothyroxine Sodium 25 Mcg Tabs (Levothyroxine Sodium) .Marland Kitchen.. 1 By Mouth Qd 5)  Nexium 40 Mg Cpdr (Esomeprazole Magnesium) .... Take 1  By Mouth Bid 6)  Proventil Hfa 108 (90 Base) Mcg/act Aers (Albuterol Sulfate) .... Inhale 2 Puff As Directed Four Times A Day Prn 7)  Triamterene-Hctz 37.5-25 Mg Caps (Triamterene-Hctz) .Marland Kitchen.. 1 Capsule By Mouth Once A Day 8)  Glucosamine Chondr 1500 Complx   Caps (Glucosamine-Chondroit-Vit C-Mn) .Marland Kitchen.. 1 By Mouth Bid 9)  Oscal 500/200 D-3 500-200 Mg-Unit  Tabs (Calcium-Vitamin D) .Marland Kitchen.. 1 By Mouth Bid 10)  Norco 5-325 Mg Tabs (Hydrocodone-Acetaminophen) .Marland Kitchen.. 1 By Mouth Two Times A Day As Needed For Pain 11)  Astepro 0.15 % Soln (Azelastine Hcl) .... Two Times A Day 12)  Crestor 10 Mg Tabs (Rosuvastatin Calcium) .... One By Mouth Once Daily  Allergies: 1)  ! Pcn 2)   ! Tetracycline 3)  ! Ceclor 4)  ! Zithromax 5)  ! Trovan 6)  ! Cephalexin 7)  ! * Clindamycin 8)  ! Ibuprofen 9)  ! * Simvastatin 10)  ! Pravachol 11)  ! Sulfa  Past History:  Past Medical History: MIGRAINE HEADACHE (ICD-346.90) HYPERLIPIDEMIA (ICD-272.4) AORTIC INSUFFICIENCY, MILD (ICD-424.1) COUMADIN THERAPY (ICD-V58.61) COLONIC POLYPS, HX OF (ICD-V12.72) HYPOTHYROIDISM (ICD-244.9) GERD (ICD-530.81) OSTEOARTHRITIS (ICD-715.90) HYPERTENSION (ICD-401.9) DEPRESSION (ICD-311) ATRIAL FIBRILLATION (ICD-427.31) ASTHMA (ICD-493.90) ALLERGIC RHINITIS (ICD-477.9)    Past Surgical History: Reviewed history from 12/06/2006 and no changes required. Appendectomy Hysterectomy Tubal ligation  Social History: Reviewed history from 08/30/2007 and no changes required. 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 Remaining systems are negative.   Vital Signs:  Patient profile:   70 year old female Height:      68 inches Weight:      218 pounds BMI:     33.27 Pulse rate:   69 / minute Resp:     14 per minute BP sitting:   156 / 74  (left arm)  Vitals Entered By: Kem Parkinson (June 18, 2009 9:24 AM)  Physical Exam  General:  Well-developed well-nourished in no acute distress.  Skin is warm  and dry.  HEENT is normal.  Neck is supple. No thyromegaly.  Chest is clear to auscultation with normal expansion.  Cardiovascular exam is regular rate and rhythm.  Abdominal exam nontender or distended. No masses palpated. Extremities show no edema. neuro grossly intact    EKG  Procedure date:  06/18/2009  Findings:      normal sinus rhythm at a rate of 69. Axis normal. No ST  changes.  Impression & Recommendations:  Problem # 1:  HYPERLIPIDEMIA (ICD-272.4)  Continue statin. Lipids and liver followed by her primary care. Her updated medication list for this problem includes:    Crestor 10 Mg Tabs (Rosuvastatin calcium) ..... One by mouth once daily  Her updated medication list for this problem includes:    Crestor 10 Mg Tabs (Rosuvastatin calcium) ..... One by mouth once daily  Problem # 2:  AORTIC INSUFFICIENCY, MILD (ICD-424.1)  Her updated medication list for this problem includes:    Triamterene-hctz 37.5-25 Mg Caps (Triamterene-hctz) .Marland Kitchen... 1 capsule by mouth once a day  Problem # 3:  COUMADIN THERAPY (ICD-V58.61) Followed in the Coumadin clinic. Goal INR 2-3. Hemoglobin monitored by her primary care.  Problem # 4:  ATRIAL FIBRILLATION (ICD-427.31) Patient remains in sinus rhythm. Continue Cardizem and Coumadin. Her updated medication list for this problem includes:    Warfarin Sodium 5 Mg Tabs (Warfarin sodium) .Marland Kitchen... Take as directed by coumadin clinic.  Problem # 5:  HYPERTENSION (ICD-401.9) Blood pressure mildly elevated. I've asked her to follow this at home. If her systolic is over 161 in general then we will increase her Cardizem. Her updated medication list for this problem includes:    Cartia Xt 240 Mg Cp24 (Diltiazem hcl coated beads) .Marland Kitchen... 1 by mouth qd    Triamterene-hctz 37.5-25 Mg Caps (Triamterene-hctz) .Marland Kitchen... 1 capsule by mouth once a day  Problem # 6:  ASTHMA (ICD-493.90)  Her updated medication list for this problem includes:    Advair Diskus 500-50 Mcg/dose Misc (Fluticasone-salmeterol) .Marland Kitchen... 1 puff bid    Proventil Hfa 108 (90 Base) Mcg/act Aers (Albuterol sulfate) ..... Inhale 2 puff as directed four times a day prn  Her updated medication list for this problem includes:    Advair Diskus 500-50 Mcg/dose Misc (Fluticasone-salmeterol) .Marland Kitchen... 1 puff bid    Proventil Hfa 108 (90 Base) Mcg/act Aers (Albuterol sulfate) ..... Inhale 2  puff as directed four times a day prn  Problem # 7:  HYPOTHYROIDISM (ICD-244.9)  Her updated medication list for this problem includes:    Levothyroxine Sodium 25 Mcg Tabs (Levothyroxine sodium) .Marland Kitchen... 1 by mouth qd  Her updated medication list for this problem includes:    Levothyroxine Sodium 25 Mcg Tabs (Levothyroxine sodium) .Marland Kitchen... 1 by mouth qd  Patient Instructions: 1)  Your physician recommends that you schedule a follow-up appointment in: ONE YEAR

## 2010-05-27 NOTE — Letter (Signed)
Summary: Handout Printed  Printed Handout:  - Coumadin Instructions-w/out Meds 

## 2010-05-27 NOTE — Medication Information (Signed)
Summary: [name] [name]  Anticoagulant Therapy  Managed by: Bethena Midget, RN, BSN Referring MD: Olga Millers MD PCP: Etta Grandchild MD Supervising MD: Riley Kill MD, Maisie Fus Indication 1: Atrial Fibrillation (ICD-427.31) Lab Used: [facility] Site: [facility] INR POC 2.3 INR RANGE 2 - 3  Dietary changes: no    Health status changes: no    Bleeding/hemorrhagic complications: no    Recent/future hospitalizations: no    Any changes in medication regimen? no    Recent/future dental: no  Any missed doses?: no       Is patient compliant with meds? yes       Allergies: 1)  ! Pcn 2)  ! Tetracycline 3)  ! Ceclor 4)  ! Zithromax 5)  ! Trovan 6)  ! Cephalexin 7)  ! * Clindamycin 8)  ! Ibuprofen 9)  ! * Simvastatin 10)  ! Pravachol 11)  ! Sulfa 12)  ! Levaquin (Levofloxacin)  Anticoagulation Management History:      The patient is taking warfarin and comes in today for a routine follow up visit.  Positive risk factors for bleeding include an age of 49 years or older.  Negative risk factors for bleeding include no history of CVA/TIA.  The bleeding index is 'intermediate risk'.  Positive CHADS2 values include History of HTN.  Negative CHADS2 values include Age > 64 years old, History of Diabetes, and Prior Stroke/CVA/TIA.  The start date was 11/15/2005.  Her last INR was 5.4 ratio.  Anticoagulation responsible provider: Riley Kill MD, Maisie Fus.  INR POC: 2.3.  Cuvette Lot#: [id number].  Exp: 12/2010.    Anticoagulation Management Assessment/Plan:      The patient's current anticoagulation dose is Warfarin sodium 5 mg  tabs: Take as directed by coumadin clinic..  The target INR is 2 - 3.  The next INR is due 11/10/2009.  Anticoagulation instructions were given to patient.  Results were reviewed/authorized by Bethena Midget, RN, BSN.  She was notified by Bethena Midget, RN, BSN.         Prior Anticoagulation Instructions: INR 3.0  Continue taking 5 mg on Monday, wednesday, and Friday and 7.5 mg  all other days.  Return to clinic in 4 weeks.    Current Anticoagulation Instructions: INR 2.3 Continue 7.5mg s daily except 5mg s on Mondays, Wednesdays, and Fridays. Recheck in 4 weeks.

## 2010-05-27 NOTE — Medication Information (Signed)
Summary: rov/tm  Anticoagulant Therapy  Managed by: Cloyde Reams, RN, BSN Referring MD: Olga Millers MD PCP: Etta Grandchild MD Supervising MD: Clifton James MD, Cristal Deer Indication 1: Atrial Fibrillation (ICD-427.31) Lab Used: [facility] Site: [facility] INR POC 2.1 INR RANGE 2 - 3  Dietary changes: no    Health status changes: no    Bleeding/hemorrhagic complications: yes       Details: Scant amt hemmroidal bleeding  Recent/future hospitalizations: no    Any changes in medication regimen? no    Recent/future dental: no  Any missed doses?: no       Is patient compliant with meds? yes       Allergies: 1)  ! Pcn 2)  ! Tetracycline 3)  ! Ceclor 4)  ! Zithromax 5)  ! Trovan 6)  ! Cephalexin 7)  ! * Clindamycin 8)  ! Ibuprofen 9)  ! * Simvastatin 10)  ! Pravachol 11)  ! Sulfa 12)  ! Levaquin (Levofloxacin)  Anticoagulation Management History:      The patient is taking warfarin and comes in today for a routine follow up visit.  Positive risk factors for bleeding include an age of 72 years or older.  Negative risk factors for bleeding include no history of CVA/TIA.  The bleeding index is 'intermediate risk'.  Positive CHADS2 values include History of HTN.  Negative CHADS2 values include Age > 13 years old, History of Diabetes, and Prior Stroke/CVA/TIA.  The start date was 11/15/2005.  Her last INR was 5.4 ratio.  Anticoagulation responsible provider: Clifton James MD, Cristal Deer.  INR POC: 2.1.  Cuvette Lot#: [id number].  Exp: 02/2011.    Anticoagulation Management Assessment/Plan:      The patient's current anticoagulation dose is Warfarin sodium 5 mg  tabs: Take as directed by coumadin clinic..  The target INR is 2 - 3.  The next INR is due 03/23/2010.  Anticoagulation instructions were given to patient.  Results were reviewed/authorized by Cloyde Reams, RN, BSN.  She was notified by Cloyde Reams RN.         Prior Anticoagulation Instructions: INR 3.1 Today take only  2.5mg  then resume 7.5mg s daily except 5mg s on Mondays, Wednesdays and Fridays. Recheck in 3 weeks.   Current Anticoagulation Instructions: INR 2.1  Continue on same dosage 1.5 tablets daily except 1 tablet on Mondays, Wednesdays, and Fridays.  Recheck in 4 weeks.

## 2010-06-01 ENCOUNTER — Other Ambulatory Visit (HOSPITAL_COMMUNITY): Payer: Self-pay | Admitting: Orthopedic Surgery

## 2010-06-01 DIAGNOSIS — R52 Pain, unspecified: Secondary | ICD-10-CM

## 2010-06-07 ENCOUNTER — Other Ambulatory Visit (HOSPITAL_COMMUNITY): Payer: Self-pay

## 2010-06-07 ENCOUNTER — Encounter (HOSPITAL_COMMUNITY): Payer: Self-pay

## 2010-06-15 ENCOUNTER — Ambulatory Visit (HOSPITAL_COMMUNITY)
Admission: RE | Admit: 2010-06-15 | Discharge: 2010-06-15 | Disposition: A | Discharge status: Home / Self Care | Payer: Medicare Other | Source: Ambulatory Visit | Attending: Orthopedic Surgery | Admitting: Orthopedic Surgery

## 2010-06-15 DIAGNOSIS — M1710 Unilateral primary osteoarthritis, unspecified knee: Secondary | ICD-10-CM | POA: Insufficient documentation

## 2010-06-15 DIAGNOSIS — M25569 Pain in unspecified knee: Secondary | ICD-10-CM | POA: Insufficient documentation

## 2010-06-15 DIAGNOSIS — I4891 Unspecified atrial fibrillation: Secondary | ICD-10-CM

## 2010-06-15 DIAGNOSIS — IMO0002 Reserved for concepts with insufficient information to code with codable children: Secondary | ICD-10-CM | POA: Insufficient documentation

## 2010-06-15 DIAGNOSIS — R52 Pain, unspecified: Secondary | ICD-10-CM

## 2010-06-15 DIAGNOSIS — M7989 Other specified soft tissue disorders: Secondary | ICD-10-CM | POA: Insufficient documentation

## 2010-06-15 MED ORDER — TECHNETIUM TC 99M MEDRONATE IV KIT
25.0000 | PACK | Freq: Once | INTRAVENOUS | Status: AC | PRN
  Administered 2010-06-15: 25 via INTRAVENOUS

## 2010-06-17 ENCOUNTER — Encounter (INDEPENDENT_AMBULATORY_CARE_PROVIDER_SITE_OTHER): Payer: Medicare Other

## 2010-06-17 ENCOUNTER — Encounter: Payer: Self-pay | Admitting: Internal Medicine

## 2010-06-17 DIAGNOSIS — Z7901 Long term (current) use of anticoagulants: Secondary | ICD-10-CM

## 2010-06-17 DIAGNOSIS — I4891 Unspecified atrial fibrillation: Secondary | ICD-10-CM

## 2010-06-17 LAB — CONVERTED CEMR LAB: POC INR: 2

## 2010-06-22 NOTE — Medication Information (Signed)
Summary: rov/ewj pt rsa appt/mt  Anticoagulant Therapy  Managed by: Eda Keys, PharmD Referring MD: Olga Millers MD PCP: Etta Grandchild MD Supervising MD: Ladona Ridgel MD, Sharlot Gowda Indication 1: Atrial Fibrillation (ICD-427.31) Lab Used: [facility] Site: [facility] INR POC 2.0 INR RANGE 2 - 3  Dietary changes: yes       Details: possibly a few less servings of greens  Health status changes: no    Bleeding/hemorrhagic complications: no    Recent/future hospitalizations: no    Any changes in medication regimen? no    Recent/future dental: no  Any missed doses?: no       Is patient compliant with meds? yes       Current Medications (verified): 1)  Cartia Xt 240 Mg  Cp24 (Diltiazem Hcl Coated Beads) .Marland Kitchen.. 1 By Mouth Qd 2)  Advair Diskus 500-50 Mcg/dose  Misc (Fluticasone-Salmeterol) .Marland Kitchen.. 1 Puff Bid 3)  Warfarin Sodium 5 Mg  Tabs (Warfarin Sodium) .... Take As Directed By Coumadin Clinic. 4)  Levothyroxine Sodium 25 Mcg Tabs (Levothyroxine Sodium) .Marland Kitchen.. 1 By Mouth Qd 5)  Nexium 40 Mg Cpdr (Esomeprazole Magnesium) .... Take 1  By Mouth Bid 6)  Triamterene-Hctz 37.5-25 Mg Caps (Triamterene-Hctz) .Marland Kitchen.. 1 Capsule By Mouth Once A Day 7)  Glucosamine Chondr 1500 Complx   Caps (Glucosamine-Chondroit-Vit C-Mn) .Marland Kitchen.. 1 By Mouth Bid 8)  Oscal 500/200 D-3 500-200 Mg-Unit  Tabs (Calcium-Vitamin D) .Marland Kitchen.. 1 By Mouth Bid 9)  Norco 5-325 Mg Tabs (Hydrocodone-Acetaminophen) .Marland Kitchen.. 1 By Mouth Two Times A Day As Needed For Pain 10)  Astepro 0.15 % Soln (Azelastine Hcl) .... Two Times A Day 11)  Crestor 10 Mg Tabs (Rosuvastatin Calcium) .... One By Mouth Once Daily 12)  Coenzyme Q10 200 Mg Caps (Coenzyme Q10) .... Take 200 Mg By Mouth Daily. 13)  Omnaris 50 Mcg/act Susp (Ciclesonide) .... 2 Puffs Each Nostril 14)  Claritin 10 Mg Tabs (Loratadine) .... As Needed 15)  Ventolin Hfa 108 (90 Base) Mcg/act Aers (Albuterol Sulfate) .Marland Kitchen.. 1-2 Puffs Qid As Needed For Wheezing  Allergies (verified): 1)  !  Pcn 2)  ! Tetracycline 3)  ! Ceclor 4)  ! Zithromax 5)  ! Trovan 6)  ! Cephalexin 7)  ! * Clindamycin 8)  ! Ibuprofen 9)  ! * Simvastatin 10)  ! Pravachol 11)  ! Sulfa 12)  ! Levaquin (Levofloxacin)  Anticoagulation Management History:      The patient is taking warfarin and comes in today for a routine follow up visit.  Positive risk factors for bleeding include an age of 72 years or older.  Negative risk factors for bleeding include no history of CVA/TIA.  The bleeding index is 'intermediate risk'.  Positive CHADS2 values include History of HTN.  Negative CHADS2 values include Age > 59 years old, History of Diabetes, and Prior Stroke/CVA/TIA.  The start date was 11/15/2005.  Her last INR was 5.4 ratio.  Anticoagulation responsible provider: Ladona Ridgel MD, Sharlot Gowda.  INR POC: 2.0.  Cuvette Lot#: [id number].  Exp: 04/2011.    Anticoagulation Management Assessment/Plan:      The patient's current anticoagulation dose is Warfarin sodium 5 mg  tabs: Take as directed by coumadin clinic..  The target INR is 2 - 3.  The next INR is due 07/15/2010.  Anticoagulation instructions were given to patient.  Results were reviewed/authorized by Eda Keys, PharmD.  She was notified by Eda Keys.         Prior Anticoagulation Instructions: INR 2.6  Coumadin 5 mg  tablets - Continue 1.5 tablets every day except 1 tablet on Mondays, Wednesdays and Fridays   Current Anticoagulation Instructions: INR 2.0  Take 2 tablets today only, then return to normal dosing schedule of 1 tablet on Monday, Wednesda, and Friday and 1.5 tablets all other days.  Return to clinic in 4 weeks.

## 2010-07-06 ENCOUNTER — Encounter: Payer: Self-pay | Admitting: Cardiology

## 2010-07-16 ENCOUNTER — Ambulatory Visit (INDEPENDENT_AMBULATORY_CARE_PROVIDER_SITE_OTHER): Payer: Medicare Other | Admitting: *Deleted

## 2010-07-16 ENCOUNTER — Other Ambulatory Visit: Payer: Self-pay | Admitting: Internal Medicine

## 2010-07-16 DIAGNOSIS — Z7901 Long term (current) use of anticoagulants: Secondary | ICD-10-CM

## 2010-07-16 DIAGNOSIS — I4891 Unspecified atrial fibrillation: Secondary | ICD-10-CM

## 2010-07-16 NOTE — Patient Instructions (Signed)
INR 2.0 Continue taking 1 1/2 tablets (7.5 mg) daily, except take 1 tablet (5 mg) on Mondays, Wednesdays, and Fridays. Recheck in 4 weeks.

## 2010-07-20 ENCOUNTER — Ambulatory Visit: Payer: Self-pay | Admitting: Cardiology

## 2010-07-21 ENCOUNTER — Encounter: Payer: Self-pay | Admitting: Cardiology

## 2010-07-21 ENCOUNTER — Ambulatory Visit (INDEPENDENT_AMBULATORY_CARE_PROVIDER_SITE_OTHER): Payer: Medicare Other | Admitting: Cardiology

## 2010-07-21 DIAGNOSIS — I10 Essential (primary) hypertension: Secondary | ICD-10-CM

## 2010-07-21 DIAGNOSIS — J309 Allergic rhinitis, unspecified: Secondary | ICD-10-CM

## 2010-07-21 DIAGNOSIS — E7800 Pure hypercholesterolemia, unspecified: Secondary | ICD-10-CM

## 2010-07-21 DIAGNOSIS — I4891 Unspecified atrial fibrillation: Secondary | ICD-10-CM

## 2010-07-21 LAB — CBC WITH DIFFERENTIAL/PLATELET
Basophils Relative: 0.8 % (ref 0.0–3.0)
Eosinophils Absolute: 0.4 10*3/uL (ref 0.0–0.7)
Eosinophils Relative: 5.3 % — ABNORMAL HIGH (ref 0.0–5.0)
HCT: 38.1 % (ref 36.0–46.0)
Lymphs Abs: 1.8 10*3/uL (ref 0.7–4.0)
MCHC: 33.8 g/dL (ref 30.0–36.0)
MCV: 92.5 fl (ref 78.0–100.0)
Monocytes Absolute: 0.7 10*3/uL (ref 0.1–1.0)
Neutro Abs: 4.6 10*3/uL (ref 1.4–7.7)
RBC: 4.12 Mil/uL (ref 3.87–5.11)
WBC: 7.6 10*3/uL (ref 4.5–10.5)

## 2010-07-21 LAB — BASIC METABOLIC PANEL
BUN: 17 mg/dL (ref 6–23)
Calcium: 9.4 mg/dL (ref 8.4–10.5)
GFR: 76.06 mL/min (ref >60.00)
Potassium: 3.9 mEq/L (ref 3.5–5.1)
Sodium: 139 mEq/L (ref 135–145)

## 2010-07-21 MED ORDER — WARFARIN SODIUM 5 MG PO TABS: 5.0000 mg | ORAL_TABLET | ORAL | Status: DC

## 2010-07-21 MED ORDER — ALBUTEROL SULFATE HFA 108 (90 BASE) MCG/ACT IN AERS: 2.0000 | INHALATION_SPRAY | Freq: Four times a day (QID) | RESPIRATORY_TRACT | Status: DC | PRN

## 2010-07-21 MED ORDER — DILTIAZEM HCL ER COATED BEADS 240 MG PO CP24: 240.0000 mg | ORAL_CAPSULE | Freq: Every day | ORAL | Status: DC

## 2010-07-21 MED ORDER — ROSUVASTATIN CALCIUM 10 MG PO TABS: 10.0000 mg | ORAL_TABLET | Freq: Every day | ORAL | Status: DC

## 2010-07-21 MED ORDER — TRIAMTERENE-HCTZ 37.5-25 MG PO CAPS: 1.0000 | ORAL_CAPSULE | ORAL | Status: DC

## 2010-07-21 NOTE — Patient Instructions (Signed)
Your physician recommends that you schedule a follow-up appointment in: ONE YEAR LABS TODAY

## 2010-07-21 NOTE — Assessment & Plan Note (Signed)
Patient remains in sinus rhythm. Continue Cardizem and Coumadin. Check CBC.

## 2010-07-21 NOTE — Assessment & Plan Note (Signed)
Blood pressure controlled In a. Continue present medications. Check potassium and renal function.

## 2010-07-21 NOTE — Assessment & Plan Note (Signed)
We will most likely repeat her echocardiogram when she returns in one year.

## 2010-07-21 NOTE — Assessment & Plan Note (Addendum)
Management per primary care. 

## 2010-07-21 NOTE — Progress Notes (Signed)
HPI: Mrs. Kovich is a pleasant  female who has a history of paroxysmal atrial fibrillation, hypertension, and hyperlipidemia.  She had a catheterization performed by Meade Maw in October 2003.  There was no obstructive coronary disease and her ejection fraction was normal.  Her last echocardiogram was performed on December 01, 2005, which showed normal LV function.  There was no significant valvular abnormalities.  Since I last saw her in Feb 2011, the patient has dyspnea with more extreme activities but not with routine activities. It is relieved with rest. It is not associated with chest pain. There is no orthopnea, PND or pedal edema. There is no syncope or palpitations. There is no exertional chest pain.  Current Outpatient Prescriptions  Medication Sig Dispense Refill  . albuterol (VENTOLIN HFA) 108 (90 BASE) MCG/ACT inhaler Inhale 2 puffs into the lungs every 6 (six) hours as needed.        . calcium-vitamin D (OSCAL WITH D 500-200) 500-200 MG-UNIT per tablet Take 1 tablet by mouth 2 (two) times daily.        . ciclesonide (OMNARIS) 50 MCG/ACT nasal spray 2 sprays by Each Nare route daily.        Marland Kitchen diltiazem (CARDIZEM CD) 240 MG 24 hr capsule TAKE 1 CAPSULE BY MOUTH EVERY DAY  30 capsule  9  . esomeprazole (NEXIUM) 40 MG capsule Take 40 mg by mouth 2 (two) times daily.        . Fluticasone-Salmeterol (ADVAIR DISKUS) 500-50 MCG/DOSE AEPB Inhale 1 puff into the lungs every 12 (twelve) hours.        . Glucosamine-Chondroit-Vit C-Mn (GLUCOSAMINE 1500 COMPLEX PO) Take by mouth 2 (two) times daily.        Marland Kitchen levothyroxine (SYNTHROID, LEVOTHROID) 25 MCG tablet Take 25 mcg by mouth daily.        Marland Kitchen loratadine (CLARITIN) 10 MG tablet Take 10 mg by mouth daily.        Marland Kitchen triamterene-hydrochlorothiazide (DYAZIDE) 37.5-25 MG per capsule Take 1 capsule by mouth every morning.        . warfarin (COUMADIN) 5 MG tablet Take by mouth as directed.        Marland Kitchen DISCONTD: azelastine (ASTELIN) 137 MCG/SPRAY nasal  spray 1 spray by Nasal route 2 (two) times daily. Use in each nostril as directed       . DISCONTD: HYDROcodone-acetaminophen (NORCO) 5-325 MG per tablet Take 1 tablet by mouth 2 (two) times daily as needed.           Past Medical History  Diagnosis Date  . Migraine, unspecified, without mention of intractable migraine without mention of status migrainosus   . HLD (hyperlipidemia)   . Aortic insufficiency   . Anticoagulants causing adverse effect in therapeutic use   . Colon polyps   . Hypothyroidism   . GERD (gastroesophageal reflux disease)   . Osteoarthritis   . HTN (hypertension)   . Depression   . Atrial fibrillation   . Asthma   . Allergic rhinitis     Past Surgical History  Procedure Date  . Appendectomy   . Abdominal hysterectomy   . Tubal ligation     History   Social History  . Marital Status: Married    Spouse Name: N/A    Number of Children: 3  . Years of Education: N/A   Occupational History  . retired Charity fundraiser    Social History Main Topics  . Smoking status: Never Smoker   . Smokeless tobacco: Not on file  .  Alcohol Use: No  . Drug Use: Not on file  . Sexually Active: Not on file   Other Topics Concern  . Not on file   Social History Narrative  . No narrative on file    ROS: no fevers or chills, productive cough, hemoptysis, dysphasia, odynophagia, melena, hematochezia, dysuria, hematuria, rash, seizure activity, orthopnea, PND, pedal edema, claudication. Remaining systems are negative.  Physical Exam: Well-developed well-nourished in no acute distress.  Skin is warm and dry.  HEENT is normal.  Neck is supple. No thyromegaly.  Chest is clear to auscultation with normal expansion.  Cardiovascular exam is regular rate and rhythm.  Abdominal exam nontender or distended. No masses palpated. Extremities show no edema. neuro grossly intact  ECG Normal sinus rhythm at a rate of 67. No ST changes.

## 2010-07-22 ENCOUNTER — Telehealth: Payer: Self-pay | Admitting: *Deleted

## 2010-07-22 NOTE — Telephone Encounter (Signed)
pt aware of lab results  

## 2010-07-22 NOTE — Telephone Encounter (Signed)
Message copied by Deliah Goody on Thu Jul 22, 2010  5:27 PM ------      Message from: Olga Millers      Created: Wed Jul 21, 2010  2:03 PM       ok

## 2010-08-13 ENCOUNTER — Ambulatory Visit (INDEPENDENT_AMBULATORY_CARE_PROVIDER_SITE_OTHER): Payer: Medicare Other | Admitting: *Deleted

## 2010-08-13 DIAGNOSIS — I4891 Unspecified atrial fibrillation: Secondary | ICD-10-CM

## 2010-08-13 LAB — POCT INR: INR: 2

## 2010-09-07 NOTE — Assessment & Plan Note (Signed)
[facility]                            CARDIOLOGY OFFICE NOTE   [name] [name]               MRN:          [id number]  DATE:05/15/2008                            DOB:          06-21-1940    [name] [name] is a pleasant 70 year old female who I last saw on October 31, 2007.  She has a history of paroxysmal atrial fibrillation,  hypertension, and hyperlipidemia.  She had a catheterization performed  by Meade Maw in October 2003.  There was no obstructive coronary  disease and her ejection fraction was normal.  Her last echocardiogram  was performed on December 01, 2005, which showed normal LV function.  There  was no significant valvular abnormalities.  Since I last saw her, she  has had a knee replacement.  She does have recent sinusitis, but  otherwise has not had dyspnea, chest pain, syncope, or pedal edema.  She  occasionally has very brief palpitations that are not sustained.   MEDICATIONS:  1. Loratadine 10 mg p.o. daily.  2. She is on Coumadin as directed.  3. Multivitamin.  4. Triamterene and hydrochlorothiazide 37.5 mg p.o. daily.  5. Nexium 40 mg p.o. b.i.d.  6. Levothyroxine 25 mcg p.o. daily.  7. Cardia XT 240 mg p.o. daily.  8. Crestor 5 mg p.o. daily.  9. Calcium and vitamin D.  10.Glucosamine chondroitin.  11.Advair.  12.OsteoPro.  13.Omnaris.   PHYSICAL EXAMINATION:  VITAL SIGNS:  Today shows a blood pressure of  142/80 and pulse is 64.  HEENT:  Normal.  NECK:  Supple.  CHEST:  Clear.  CARDIOVASCULAR:  Regular rate and rhythm.  ABDOMEN:  No tenderness.  EXTREMITIES:  No edema.   Her electrocardiogram shows sinus rhythm at a rate of 64.  Prior septal  infarct cannot be excluded.   DIAGNOSES:  1. Paroxysmal atrial fibrillation - the patient remains in sinus      rhythm today.  She will continue on her Cardizem for rate control      if her atrial fibrillation recurs.  She also continue on Coumadin      with  goal INR of 2-3, as she has embolic risk factors of age      greater than 53 as well as hypertension.  She is also a female sex,      which would increase her risk.  2. Coumadin therapy - this is being monitored in our Coumadin Clinic.      Dr. Yetta Barre is following her CBCs.  3. History of mild aortic insufficiency.  4. Hypertension - her blood pressure is adequately controlled.  5. Hyperlipidemia - she will continue on her statin and this is being      monitored by Dr. Yetta Barre.  6. History of asthma.  7. Multiple medication intolerances.  8. Gastroesophageal reflux disease.   We will see her back in approximately 12 months.     [name] [name] Jens Som, MD, [facility]  Electronically Signed    BSC/MedQ  DD: 05/15/2008  DT: 05/16/2008  Job #: [id number]

## 2010-09-07 NOTE — Op Note (Signed)
[name] [name], NIZIOLEK        ACCOUNT NO.:  [id number]   MEDICAL RECORD NO.:  [id number]          PATIENT TYPE:  INP   LOCATION:  5011                         FACILITY:  [facility]   PHYSICIAN:  Dyke Brackett, M.D.    DATE OF BIRTH:  [birth year]   DATE OF PROCEDURE:  11/16/2007  DATE OF DISCHARGE:                               OPERATIVE REPORT   INDICATIONS:  This is a 70 year old with severe valgus osteoarthritic  right knee, thought be amenable to overnight hospitalization for  surgery.   PREOPERATIVE DIAGNOSIS:  Osteoarthritis, right knee with valgus  deformity.   POSTOPERATIVE DIAGNOSIS:  Osteoarthritis, right knee with valgus  deformity.   OPERATION:  1. Right total knee replacement.  2. Lateral release (LCS standard femurs, size 3 tibia, 12.5-mm bearing      standard three-peg patella).   SURGEON:  Dyke Brackett, MD   ASSISTANT:  Sharol Given, PA   TOURNIQUET TIME:  1 hour 20 minutes.   PROCEDURE:  Sterile prep and drape, exsanguination of the leg, inflation  to 350, straight skin incision medial parapatellar approach to the knee  made, identification of the most diseased valgus compartment with a cut  about 2.5 to 3 mm below the lateral compartment with external guide.  This was followed by anteroposterior femoral cut and flexion gap  eventually measured at 12.5 mm followed by 4 degree distal valgus cut  and a finishing guide cut as well.  Excess portion of the menisci were  removed as well as recessing release of the popliteus due to the valgus  deformity.  Attention was next directed to the tibia.  A key hole was  cut through the tibia and then it was sized before that to be a size 3  tibia, trial bearing placed.  Patella was cut leaving about 14-15 mm of  native three-peg patella.  Patellar tracking was noticeably lateralized,  and therefore lateral release was accomplished from inside the knee.  Eventually, the geniculate vessel after the tourniquet was  released was  seen as it was bleeding and was coagulated.  The trial components were  inserted.  Excellent stability was noted, full extension with slight  hyperextension, flexion with a slightly positive drawer, but good  bearing stability.  Trial components were removed.  Final components  inserted with 2 bags of cement without antibiotics due to multiple  antibiotic allergies on part of the patient.  Cement was allowed to  harden and trial bearing had been placed.  This was removed.  The  tourniquet was released.  No excess bleeding was noted at the posterior  aspect of the knee.  Small portions of cement were removed and the final  12.5 mm bearing was gunned and stability again deemed to be excellent.  Closure of the  capsule was effected with #1 Ethibond and then 2-0 Vicryl and skin  clips.  Hemovac drain was placed.  Marcaine with epinephrine infiltrated  in the skin.  Light  compressive sterile dressing, knee immobilizer  applied, taken to recovery room in stable condition.       Dyke Brackett, M.D.  Electronically Signed  WDC/MEDQ  D:  11/16/2007  T:  11/16/2007  Job:  [id number]

## 2010-09-07 NOTE — Assessment & Plan Note (Signed)
[facility]                            CARDIOLOGY OFFICE NOTE   NAME:[name] [name] HECKART               MRN:          [id number]  DATE:12/13/2006                            DOB:          1940-07-27    The patient is a very pleasant female who I have seen in the past for  paroxysmal atrial fibrillation.  When I last saw her, we did repeat her  echocardiogram secondary to aortic insufficiency.  Her left ventricular  function was normal and there was mild aortic insufficiency.  There was  no other abnormality noted.  Since I last saw her, she did fracture her  ankle, but has, otherwise, done well.  She denies any dyspnea, chest  pain, or syncope.  She has brief palpitations at times, but these are  not sustained.   Her medications include Coumadin as directed.  She also takes  glucosamine, a multivitamin, Astelin, Symbicort, triamterine  hydrochlorothiazide 37.5 tablets daily, Nexium, levothyroxine 25 mcg  p.o. daily, Cartia XT 240 mg p.o. daily, Clarinex, Crestor 10 mg p.o.  daily, calcium and vitamin D, glucosamine and Rhinocort.   PHYSICAL EXAMINATION:  Shows a blood pressure of 144/82 and the pulse is  67.  She weighs 210 pounds.  Her HEENT is normal.  Her neck is supple with no bruits.  Her chest is clear.  CARDIOVASCULAR EXAM:  Regular rate and rhythm.  ABDOMINAL EXAM:  Benign.  EXTREMITIES:  Show no edema.   Her electrocardiogram shows a sinus rhythm at a rate of 63.  The axis is  normal.  There are no ST changes noted.   DIAGNOSES:  1. History of paroxysmal atrial fibrillation:  The patient remains in      sinus rhythm today, and will continue with Cardizem with rate      control if her atrial fibrillation recurs.  She will also continue      on Coumadin with a goal INR of 2-3.  This is being followed in our      Coumadin clinic.  She has embolic risk factors of hypertension and      age greater than 23.  2. Coumadin therapy:  As per  above.  3. History of mild aortic insufficiency:  Her most recent      echocardiogram showed no change.  4. Hypertension:  Her blood pressure is well controlled.  5. Hyperlipidemia:  We will check lipids and liver today and adjust as      indicated.  I will also check a BMET and followup potassium and      renal function given her diuretic use.  We will check a CBC today      as well given her Coumadin use.  6. History of asthma.  7. Multiple medication allergies.  8. History of gastroesophageal reflux disease.   We will see her back in 12 months or sooner if necessary.     [name] Frieze Jens Som, MD, [facility]  Electronically Signed    BSC/MedQ  DD: 12/13/2006  DT: 12/13/2006  Job #: [id number]

## 2010-09-07 NOTE — Discharge Summary (Signed)
[name] [name], [name] [name]        ACCOUNT NO.:  [id number]   MEDICAL RECORD NO.:  [id number]          PATIENT TYPE:  INP   LOCATION:  6743                         FACILITY:  [facility]   PHYSICIAN:  Dyke Brackett, M.D.    DATE OF BIRTH:  [birth year]   DATE OF ADMISSION:  11/21/2007  DATE OF DISCHARGE:  11/25/2007                               DISCHARGE SUMMARY   ADMITTING DIAGNOSIS:  Pneumonia.   FINAL DIAGNOSIS:  Pneumonia.   HOSPITAL COURSE:  On November 21, 2007, [name] [name], a 70 year old  female, comes in and is admitted by Dr. Dion Saucier to Dr. Candise Bowens service  for elevated temperature following total knee replacement and concerns  for pneumonia.  The patient had internal medicine consult and started on  antibiotics.  She was continued on Coumadin per protocol following a  total knee replacement, started out on vancomycin for antibiotics by  Internal Medicine.  Second day in the hospital was November 22, 2007, the  patient's temperature max was 100 at that point, and vital signs  otherwise were stable.  Hemoglobin was 8, hematocrit 26, white blood  cell count 8, INR was at 1.5.  She is status post right total knee  replacement in the hospital for pneumonia, is currently on vancomycin  followed by Internal Medicine.  Third day in the hospital was November 23, 2007, continued to be followed by [facility].  The  patient was doing well.  INR was 1.4.  Vital signs were stable and  afebrile.  Oxygen saturation 93% on room air, doing well 0-90 on CPM  machine.  Recent chest x-ray showing pneumonia resolved.  Awaiting  discharge from Internal Medicine.  They will allow to go home possibly  on p.o. Avelox or if she can get a PICC line.  Internal Medicine did  determine the patient would be able to go home eventually on p.o. Avelox  400 mg daily pending a repeat chest x-ray and blood cultures.  November 24, 2007, was the eighth postoperative day from a right total knee  replacement.  Hemoglobin was 8.5, hematocrit 24.6, white blood cell  count 8.5.  Vital signs were stable.  She had hospital-acquired  pneumonia.  She was given 1 unit of packed red blood cells and  transfused to the lower hemoglobin and possibility of Internal Medicine  at that point to discharge when felt safe on p.o. Avelox.  Continue on  Coumadin protocol for postop right total knee replacement.  November 25, 2007, was postop day #9 from right total knee replacement.  The patient  still in the hospital for pneumonia.  Hemoglobin was 9.9, hematocrit 30,  white blood cell count 8.4, INR was 1.8.  Urinalysis was negative.  Lungs were clear to auscultation on exam, regular rate and rhythm.  Pneumonia was resolving, and Internal Medicine allowed discharge to home  on p.o. Avelox 400 mg daily where they expect her to be on 5 more days  of antibiotics.   DISCHARGE MEDICATIONS:  1. Multivitamin.  2. Calcium per day.  3. Nexium 40 mg daily.  4. Clarinex 5 mg daily.  5. Triamterene/hydrochlorothiazide 37.5/25  mg daily.  6. Crestor 10 mg at bedtime.  7. Levothyroxine 25 mcg daily.  8. Omnaris inhaler daily.  9. Astepro inhaler b.i.d.  10.Advair Diskus 500/50 b.i.d.  11.Cartia XT 240 mg daily.  12.Xopenex 2 puffs every 4-6 hours p.r.n.  13.Avelox 400 mg daily for another 5 days.  14.Percocet 5/325 take 1 tablet to 2 tablets every 4-6 hours p.r.n.      pain.  15.Robaxin 500 mg p.o. q.6-8 h. p.r.n. pain.   ASSESSMENT AND PLAN:  1. The patient's final diagnosis is with discharged home pneumonia,      p.o. Avelox.  2. Postoperative day #9, discharged home from right total knee      replacement.  Follow up in our office for staple removal in 5 days      and wound check.  The patient discharged home, 50% weightbearing on      the right lower extremity.  3. The patient's diet was regular.  4. The patient was in stable condition.      Sharol Given, PA      Dyke Brackett, M.D.   Electronically Signed    JBS/MEDQ  D:  01/02/2008  T:  01/03/2008  Job:  [id number]
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
## 2010-09-07 NOTE — H&P (Signed)
[name] [name], [name] [name]        ACCOUNT NO.:  [id number]   MEDICAL RECORD NO.:  [id number]          PATIENT TYPE:  INP   LOCATION:  6743                         FACILITY:  [facility]   PHYSICIAN:  Eulas Post, MD    DATE OF BIRTH:  04-Dec-1940   DATE OF ADMISSION:  11/21/2007  DATE OF DISCHARGE:                              HISTORY & PHYSICAL   CHIEF COMPLAINT:  Fevers and cough.   HISTORY OF PRESENT ILLNESS:  [name] [name] is a 70 year old woman who  is recently status post total knee arthroplasty performed by Dr. Madelon Lips  on November 16, 2007.  She is discharged on November 20, 2007.  She has a  history of multiple episodes of recurrent bronchitis as well as asthma.  She says that over the past 48 hours, she has had increasing cough that  has been productive with green sputum that has been somewhat thin.  She  also complains of a sore throat and chest congestion.  She has had  fevers of 102 degrees Fahrenheit.  She says that her knee has been doing  reasonably well and she has been using a CPM at home.  She came back to  the hospital because of the recurrent fevers.  She has also had some  shaking chills.   PAST MEDICAL HISTORY:  Asthma and recurrent bronchitis as well as a  total knee arthroplasty and a history of atrial fibrillation,  hypertension, and hyperlipidemia.   FAMILY HISTORY:  Significant for diabetes as well as coronary artery  disease in her mother and her mother also had multiple episodes of  pneumonia.   Allergies include PENICILLIN, TETRACYCLINE, CECLOR, ZITHROMAX,  CEPHALEXIN, CLINDAMYCIN, IBUPROFEN, ZOCOR, PRAVACHOL, TYLENOL, AND  TROVAN.   SOCIAL HISTORY:  She is a nonsmoker.   REVIEW OF SYSTEMS:  Positive for constipation, easy bruising, and recent  pulmonary infections as well as asthma.  A 14-point review of systems  was performed and was otherwise negative with the exception of  orthopedic musculoskeletal complaints around the knee as per normal  postoperative course.   PHYSICAL EXAMINATION:  GENERAL:  She is lying in no acute distress on a  gurney.  NECK:  Full range of motion.  She has some mild cervical  lymphadenopathy.  She has a midline trachea.  No masses.  CARDIOVASCULAR:  She has trace peripheral edema.  RESPIRATORY:  She has no increasing respiratory efforts, but she does  have a coarse voice.  She has no cyanosis.  GI:  Her abdomen is soft and nontender.  There is no hepatosplenomegaly.  PSYCHIATRIC:  Her mood and affect are appropriate and her judgment is  intact.  NEUROLOGIC:  Her sensation is intact throughout both feet.  MUSCULOSKELETAL:  Her right knee has an incision of her total knee  arthroplasty which is clean with no evidence of infection.  There is  some slight erythema around the skin edges, but this is consistent with  a normal postoperative appearance.   LABORATORY VALUES:  Laboratories on today's admission are pending at  this time.  On the November 20, 2007, she had a white count of 9.3 and an  INR of 1.8 with a creatinine of 0.7.   Per the emergency room report, she has a chest film that demonstrates a  small focal opacity in the left lower lobe that may be consistent with  pneumonia or aspiration.   IMPRESSION:  1. Acute postoperative pneumonia.  2. Status post right total knee arthroplasty.  3. Recurrent bronchitis and asthma.   PLAN:  We are going to admit her for management of her pneumonia.  We  have discussed the case with the [facility] Service, and we  would like her to be on the Orthopedic Service.  Therefore, she will be  admitted to Orthopedics with the [facility] assisting [place] with  the management of her pneumonia.  She will have the CPM for her knee and  will be resuming physical therapy per standard postop total knee  arthroplasty protocol.  Antibiotic management will be decided upon by  the [facility].  I will discuss her care with Dr. Madelon Lips in the   morning.      Eulas Post, MD  Electronically Signed     JPL/MEDQ  D:  11/22/2007  T:  11/22/2007  Job:  [id number]

## 2010-09-07 NOTE — Discharge Summary (Signed)
NAMELENIYAH, MARTELL        ACCOUNT NO.:  [id number]   MEDICAL RECORD NO.:  [id number]          PATIENT TYPE:  INP   LOCATION:  5507                         FACILITY:  [facility]   PHYSICIAN:  Dyke Brackett, M.D.    DATE OF BIRTH:  [birth year]   DATE OF ADMISSION:  11/16/2007  DATE OF DISCHARGE:  11/20/2007                               DISCHARGE SUMMARY   PROCEDURE TYPE:  Right total knee replacement.   ADMITTING DIAGNOSIS:  Right knee osteoarthritis.   DISCHARGE DIAGNOSIS:  Right knee osteoarthritis.   HOSPITAL COURSE:  Ms. Badalamenti is a 70 year old female who came to  the hospital with history of right knee osteoarthritis failed with  conservative treatment, therefore wanting to go forward with a right  total knee replacement.  When she is admitted to the hospital on November 16, 2007, for right total knee replacement, successfully went through  the procedure and then was transferred to the PACU in stable condition,  went up to 5000 before but she was later transferred to 5500 due to air  conditioning problems in 5000.   The patient's first postop day following right total knee replacement  was on November 17, 2007.  The patient was doing well on first postop day;  had 50% weightbearing.  Labs and vitals were within normal limits.  The  patient had some mild itching, so was given some Benadryl and the PCA  was discontinued at that point.  She was set to be in the CPM from 0 to  90 degrees.  Doing well on first postop day on November 17, 2007.  Second  postop day was November 18, 2007.  Vitals were still stable.  Lab work  within normal limit.  The patient's dressing was changed, continued  weightbearing at 50% on the right lower extremity.  Postoperative day  #3, was on November 19, 2007.  The patient passed gas.  No bowel movement.  Denied any issues with near syncopal episodes or dizziness.  Hemoglobin  was at 8.8, suspected from acute anemia blood loss.  Otherwise, exam was  completely  normal.  The patient may get up for physical therapy.  The  patient opted for physical therapy on November 19, 2007, and occupational  therapy one time during the day.  According to the nursing staff, has  refused second visit with physical therapy.  The patient was able to  ambulate 50 feet with a rolling walker and was able to transfer with  some cues for technique, and per patient did overall well with therapy.  Reviewed therapy note and noted the plan, the patient was progressing  towards goals.  Postoperative day #4 was on November 20, 2007.  The patient  still had not had a bowel movement, but passing gas and belly was not  hurting her.  Denied any chest pain or trouble breathing.  Hemoglobin  was at 9.3, and it was elevated from 8.8 and INR was at 1.8.  Vital  signs were within normal limits.  Wound looked good with no drainage;  mild redness surrounding the incision but no signs of infection.  There  was no  erythema or purulent drainage or red streaks.  The patient was in  CPM and wished to continue 0 to 90 degrees 6-8 hours per day.  Still 50%  weightbearing in the right lower extremity.  Plan on postop day #4 was  to discharge home following physical therapy.  Just wanted to get her  one more visit with physical therapy here in the hospital before  assuming she will be safe to go home.  Nursing staff instructed her to  call if she has any issues.   ASSESSMENT AND PLAN:  She is status post right total knee replacement 4  days postoperative:  1. She is in stable condition.  2. Diet should be as tolerated.  3. Plan for followup in the office in 10 days with Dr. Madelon Lips for      staple removal and wound check.  4. Lovenox will be discontinued after today's amount at the hospital,      use just the Coumadin.  5. Therapy will be held as directed by [place].  6. Monitor Coumadin level from her pharmacy and also do home health      physical therapy.  7. She will be still 50% weightbearing  in the right lower extremity.  8. Follow up in the office.   DISCHARGE MEDICATIONS:  1. Coumadin 7.5 mg currently, be dosed by pharmacy.  2. Robaxin 500 mg 1 tablet p.o. q.6-8 hours p.r.n. pain, spasm.  3. Percocet 5/325 mg 1-2 tablets p.o. q.4-6 hours p.r.n. pain.  4. On Spiriva inhaler.  5. Xopenex inhaler.  6. Advair inhaler, be done twice a day.  7. Aspira twice a day.  8. Fluconazole, be done daily inhaler.  9. Omnaris daily inhaler.  10.Levothyroxine 25 mcg daily in the morning.  11.Crestor 10 mg daily.  12.Triamterene and hydrochlorothiazide, which is 37.5/25 mg daily.  13.Cartia XT 250 mg daily.  14.Nexium 40 mg b.i.d.  15.Clarinex 5 mg daily.  16.Multivitamin daily.  17.Os-Cal 600 plus 500 vitamin D twice a day.      Sharol Given, PA      Dyke Brackett, M.D.  Electronically Signed    JBS/MEDQ  D:  11/20/2007  T:  11/20/2007  Job:  [id number]

## 2010-09-08 ENCOUNTER — Ambulatory Visit (INDEPENDENT_AMBULATORY_CARE_PROVIDER_SITE_OTHER): Payer: Medicare Other | Admitting: *Deleted

## 2010-09-08 DIAGNOSIS — I4891 Unspecified atrial fibrillation: Secondary | ICD-10-CM

## 2010-09-10 NOTE — Op Note (Signed)
[name], [name]        ACCOUNT NO.:  [id number]   MEDICAL RECORD NO.:  [id number]          PATIENT TYPE:  AMB   LOCATION:  DSC                          FACILITY:  [facility]   PHYSICIAN:  [name] [name], M.D.DATE OF BIRTH:  03-13-1941   DATE OF PROCEDURE:  11/15/2005  DATE OF DISCHARGE:                                 OPERATIVE REPORT   PREOPERATIVE DIAGNOSIS:  Chronic pan sinus disease.   POSTOPERATIVE DIAGNOSIS:  Chronic pan sinus disease.   PROCEDURE PERFORMED:  Functional endoscopic sinus surgery with bilateral  total ethmoidectomies, bilateral maxillary ostial enlargement, left  sphenoidotomy.  Bilateral inferior turbinate reductions.   SURGEON:  Kristine Garbe. Ezzard [name], M.D.   ANESTHESIA:  General endotracheal.   COMPLICATIONS:  None.   BRIEF CLINICAL NOTE:  The patient is a 70 year old female who has had a long  history of chronic sinus disease.  Her main symptoms seem to be chronic  nasal obstruction as well as chronic postnasal drainage.  She has been  treated with several rounds of antibiotics including Biaxin, Septra and  Levaquin.  She has had reaction to several different antibiotics, allergic  to penicillin, Ceclor, tetracycline, Z-Pak and clindamycin.  She has history  of intermittent A. Fib and has been on Coumadin.  She is recently undergoing  colonoscopy and had to stop her Coumadin and has elected to have the sinus  surgery following a colonoscopy as she needs to stay off Coumadin for the  sinus surgery.  Review of her CT scan shows complete opacification of both  maxillary sinuses and opacification of most of the ethmoid sinuses as well  as opacification of the left sphenoid sinus, partial opacification of the  right sphenoid sinus and opacification of the left frontal sinus.  She was  taken to the operating room at this time for functional endoscopic sinus  surgery and turb reduction to help improve her nasal airway.  She has  minimal septal  deformity.   PROCEDURE IN DETAIL:  After adequate endotracheal anesthesia, the patient  received one gram vancomycin IV preoperatively.  The nose was prepped with  Betadine solution, draped with sterile towels.  The nose was then injected  with Xylocaine with epinephrine for hemostasis.  First the right side was  approached.  The insinuate process was incised and removed.  The maxillary  ostia was identified on the right side.  There was mucopurulent discharge  from the maxillary sinus.  The maxillary sinus ostia was enlarged with  straight through cups and back-cutting forceps.  There was a large amount of  purulence within the right maxillary sinus.  This was evacuated with suction  and then irrigated.  The anterior and posterior ethmoid areas were opened up  with a microdebrider.  There was thickened polypoid tissue but minimal  mucopurulent discharge within the ethmoid area.  The sphenoid sinus on the  right side was not opened up as the sinus was mostly air containing although  there was some moderate amount of mucoperiosteal thickening.  Next, the left  side was approached in similar fashion.  The insinuate process was incised  and removed.  The maxillary ostia  was identified on the left side and  enlarged with the straight through cups and back-cutting forceps.  The left  maxillary sinus had some thickened mucosa but had minimal mucopurulent  discharge.  There was some polypoid disease within the ethmoid area. The  anterior and posterior ethmoid areas were opened up with straight through  cup forceps as well as a microdebrider.  On the left side because of the CT  scan showing complete opacification of left sphenoid sinus, sphenoidotomy  was performed through the posterior ethmoid area medially and inferiorly.  The sphenoid sinus had some thickened mucosa but no gross mucopurulent  discharge within the left sphenoid sinus.  Microdebrider was used to enlarge  the sphenoidotomy.   Following this bipolar cauterization was used to produce  submucosal cauterization of the inferior turbinates bilaterally and the  turbinates were out-fractured. This completed the procedure, Kennedy sinus  packs were placed within the ethmoid areas bilaterally and hydrated with  Xylocaine with epinephrine.  The patient was awakened from anesthesia and  transferred to the recovery room post-op doing well.   DISPOSITION:  The patient is discharged home, later this morning on Septra  DS.  She is able to tolerate this antibiotic without difficulty for one week  and also Tylenol and Vicodin prn pain.  I will have her follow up in my  office in three days to have her Kyung Rudd sinus packs removed and we will  restart her on Coumadin in three days.           ______________________________  Kristine Garbe Ezzard [name], M.D.     CEN/MEDQ  D:  11/15/2005  T:  11/16/2005  Job:  [id number]   cc:   Corwin Levins, M.D. [facility]  [address]  [place]  [place] [address]   Meade Maw, M.D.  Fax: [phone]

## 2010-09-10 NOTE — Op Note (Signed)
NAME:  [name] [name], [name] [name]        ACCOUNT NO.:  [id number]   MEDICAL RECORD NO.:  [id number]          PATIENT TYPE:  AMB   LOCATION:  DSC                          FACILITY:  [facility]   PHYSICIAN:  Dyke Brackett, M.D.    DATE OF BIRTH:  [birth year]   DATE OF PROCEDURE:  07/04/2006  DATE OF DISCHARGE:                               OPERATIVE REPORT   INDICATIONS:  This is a 70 year old female who fractured lateral ankle  from a fall, lateral malleolus, thought to be amenable to outpatient  surgery.   PREOPERATIVE DIAGNOSIS:  Displaced Weber type B lateral malleolus  fracture.   POSTOPERATIVE DIAGNOSIS:  Displaced Weber type B lateral malleolus  fracture.   OPERATION PERFORMED:  Open reduction internal fixation of lateral ankle  fracture with Accumed 7-hole plate.   SURGEON:  Dyke Brackett, M.D.   ASSISTANTChestine Spore, PA   TOURNIQUET TIME:  Approximately 40 minutes.   DESCRIPTION OF PROCEDURE:  Sterile prep and drape.  Exsanguination of  the leg placing the tourniquet to 375.  A longitudinal skin incision was  made over the fibula.  The fracture was identified, clean of early  callus, reduced with a clamp followed by fixation with a 7-hole  precontoured plate with excellent purchase of all screws.  Three screws  distally, three proximally with the proximal most screw left open.  X-  ray in AP, lateral and mortise views confirmed an excellent reduction.  The wound was closed with inverted Vicryl and skin clips.  Marcaine with  epinephrine was infiltrated into the skin.  Lightly compressive sterile  dressing and plaster splint applied.  Taken to recovery room in stable  condition.      Dyke Brackett, M.D.  Electronically Signed     WDC/MEDQ  D:  07/04/2006  T:  07/04/2006  Job:  [id number]

## 2010-09-10 NOTE — Assessment & Plan Note (Signed)
[facility]                              CARDIOLOGY OFFICE NOTE   JEARLENE, BRIDWELL               MRN:          [id number]  DATE:11/25/2005                            DOB:          10/26/1940    [name] is a 70 year old female with a past medical history of  atrial fibrillation, hypertension, hyperlipidemia who presents for  evaluation of her atrial fibrillation.  She has previously been followed by  Dr. Meade Maw at [facility], but is now transferring her care to  [facility].  She apparently has had two previous episodes of atrial  fibrillation, both occurring in 2003.  It was a sudden onset of palpitations  associated with a feeling of anxiety.  There was no shortness of breath,  nausea, vomiting, diaphoresis, syncope or chest pain.  She has been treated  with Cardizem and Coumadin.  She did have a previous nuclear study at [facility] that suggested the possibility of anterior ischemia; however,  follow-up catheterization was performed on January 21, 2002.  She was  found to have no significant coronary disease.  Her ejection fraction was  65%.  I do have an echocardiogram from September 2003 that showed normal LV  function, mild left atrial enlargement, and mild aortic insufficiency.  The  patient typically does have some dyspnea on exertion, but ordinarily there  is no orthopnea, PND, palpitations, presyncope, syncope or exertional chest  pain.  There can be occasional mild pedal edema.   MEDICATIONS:  Coumadin as directed, Cartia XT 240 mg p.o. daily, Advair  250/500 one puff b.i.d., triamterene/hydrochlorothiazide 37.5/25 mg tablets  one p.o. daily, Nexium 40 mg p.o. daily, albuterol inhaler as needed,  Rhinocort, calcium, multivitamins, Tylenol as needed, levothyroxine 25  microgram p.o. daily, and glucosamine.   ALLERGIES:  She has multiple allergies including PENICILLIN, TETRACYCLINE,  CECLOR, ZITHROMAX,  CEPHALEXIN, TROVAN, CLINDAMYCIN, IBUPROFEN, SIMVASTATIN,  PRAVACHOL, LEVAQUIN.   SOCIAL HISTORY:  She does not smoke and only rarely consumes alcohol.   FAMILY HISTORY:  Negative for coronary artery disease.  Her mother died of  esophageal varices.   PAST MEDICAL HISTORY:  Significant for hypertension and hyperlipidemia, but  there is no diabetes mellitus.  She has hypothyroidism.  She also has a  history of asthma.  She has a history of atrial fibrillation as described in  the HPI.  She also has gastroesophageal reflux disease.  She has had a prior  hysterectomy, tubal ligation, and appendectomy.  She has had recent polyp  removal at the time of colonoscopy.  There is also a history of arthritis  and depression.  She also has allergies and has previous sinus surgery.   REVIEW OF SYSTEMS:  She denies any headaches, fever, chills.  There is no  productive cough or hemoptysis.  There is no dysphagia, odynphagia, melena,  hematochezia.  There is no dysuria, hematuria.  There is no rash or seizure  activity.  There is no orthopnea or PND, but there is occasional mild pedal  edema.  There is no claudication.  The remainder of review of systems is  negative.   PHYSICAL EXAMINATION:  VITALS:  She weighs 206 pounds.  Blood pressure  152/86, pulse 85.  GENERAL:  She is well developed and well nourished in no acute distress.  She is somewhat anxious but does not appear to be depressed.  There is no  peripheral clubbing.Marland Kitchen  SKIN:  Warm and dry.  HEENT:  Unremarkable __________.  NECK:  Supple with normal upstroke bilaterally and I cannot appreciate  bruits.  There is no jugulovenous distention and no thyromegaly noted.  CHEST:  Clear to auscultation.  Normal expansion.Marland Kitchen  CARDIOVASCULAR:  There is a regular rate and rhythm with normal S1 and S2.  There is a soft 1/6 systolic ejection murmur at the left sternal border.  There is no diastolic murmur noted.  There is no S3 or S4.  ABDOMEN:   Nontender.  Positive bowel sounds noted.  No hepatosplenomegaly.  No mass appreciated.  There is no abdominal bruit.  She has 2+ femoral  pulses bilaterally.  No bruits.  EXTREMITIES:  No edema that I can palpate.  No cords.  She has 2+ dorsalis  pedis pulses bilaterally.  NEUROLOGIC:  Grossly intact.   I do have an electrocardiogram dated Sep 09, 2005, that showed a normal  sinus rhythm at a rate of 64.  There were no ST changes noted.   DIAGNOSES:  1.  History of paroxysmal atrial fibrillation.  2.  Coumadin therapy.  3.  History of mild aortic insufficiency.  4.  Hypertension.  5.  Hyperlipidemia.  6.  History of asthma.  7.  Multiple medication allergies.  8.  Gastroesophageal reflux disease.   PLAN:  Mrs. Pederson appears to be doing well from a symptomatic  standpoint.  She does have a history of atrial fibrillation and has embolic  risk factors of age greater than 46 and also hypertension.  We will plan to  continue with her Coumadin with a goal INR of  2 through 3.  She will be  followed in our Coumadin Clinic.  She will also continue on her Cardizem for  rate control of her atrial fibrillation should it recur.  Of note, her blood  pressure is mildly elevated today but we will continue to follow this and we  can adjust her medications as indicated.  Her most recent cholesterol panel  showed an LDL that was elevated at 161.  She was hesitant to begin the  Crestor.  She has had myalgias with previous statins, however, she  apparently is going to try this medication and she will follow up with Dr.  Jonny Ruiz concerning this issue.  I have instructed her to inform the Coumadin  Clinic as there is a potential of interaction between Crestor and Coumadin.  We will plan to schedule her to have an echocardiogram to requantify her  left  ventricular function, to follow up on her aortic insufficiency.  I will otherwise see her back in 12 months.                              Madolyn Frieze Jens Som, MD, [facility]    BSC/MedQ  DD:  11/25/2005  DT:  11/25/2005  Job #:  [id number]

## 2010-09-10 NOTE — Cardiovascular Report (Signed)
[name] [name], [name] [name]                    ACCOUNT NO.:  [id number]   MEDICAL RECORD NO.:  [id number]                   PATIENT TYPE:  OIB   LOCATION:  2899                                 FACILITY:  [facility]   PHYSICIAN:  Meade Maw, M.D.                 DATE OF BIRTH:  Jun 02, 1940   DATE OF PROCEDURE:  DATE OF DISCHARGE:                              CARDIAC CATHETERIZATION   PROCEDURE PERFORMED:  Left heart catheterization, coronary angiography.   INDICATIONS FOR PROCEDURE:  Reversible ischemia in the anterior and apical  region and atrial fibrillation.   DESCRIPTION OF PROCEDURE:  After obtaining written informed consent, the  patient was brought to the cardiac catheterization laboratory in the  postabsorptive state. Preoperative sedation was achieved using IV Versed.  The right groin was prepped and draped in the usual sterile fashion. Local  anesthesia was achieved using 1% Xylocaine. A 6 French hemostasis sheath was  placed into the right femoral artery using the modified Seldinger technique.  Selective coronary angiography was performed using JL4, JR4 Judkins  catheter. Single plane ventriculogram was performed in the RAO projection.  All catheter exchange were made over a guide wire.  The hemostasis sheath  was flushed following each engagement.   FINDINGS:  There was no gradient noted on pullback.  Aortic pressure 107/58,  LV pressure 106 with an EDP of 4,  normal wall motion, ejection fraction of  65%.   CORONARY ANGIOGRAPHY:  Left main coronary artery:  The left main coronary artery bifurcates into  the left anterior descending and circumflex vessel. There is no disease in  the left main coronary artery.   Left anterior descending:  The left anterior descending gives rise to a  large first diagonal, a small diagonal #2, small diagonal #3, goes on to end  as an apical recurrent branch.  There is no disease in the left anterior  descending nor its branches.   Circumflex vessel:  The circumflex vessel gives rise to a small OM-1, large  trifurcating OM-2 and a large OM-3.  There is no disease in the circumflex  or its branches.   Right coronary artery:  The right coronary artery gives rise to several  small RV marginals.  It is dominant for the posterior circumflex giving rise  to a PDA and a large PL branch.  There is no disease in the right coronary  artery or its branches.   FINAL IMPRESSION:  False-positive stress Cardiolite.   RECOMMENDATIONS:  Medical management of her atrial fibrillation.                                                                Meade Maw, M.D.    HP/MEDQ  D:  01/21/2002  T:  01/24/2002  Job:  [id number]   cc:   Oley Balm. Georgina Pillion, M.D.  [address]  [place]  [place] [address]  Fax: [phone]

## 2010-09-10 NOTE — Procedures (Signed)
[facility]  Patient:    [name] [name], [name] [name]                 MRN: [id number] Proc. Date: 06/13/00 Adm. Date:  [id number] Attending:  Louie Bun CC:         Lenoard Aden, M.D.   Procedure Report  PROCEDURE:  Esophagogastroduodenoscopy.  INDICATIONS FOR PROCEDURE:  A patient with chronic reflux symptoms of several years duration, with occasional breakthrough symptoms despite full dose of a proton pump inhibitor.  Given her age of 66 and no prior examination by endoscopy or upper GI barium study, it was elected to pursue EGD -- primarily to rule out Barretts esophagus.  DESCRIPTION OF PROCEDURE:  The patient was placed in the left lateral decubitus position and placed on the pulse monitor with continuous low-flow oxygen delivered by nasal cannula.  She was sedated with 70 mg IV Demerol and 7 mg IV Versed.  The Olympus video endoscope was advanced under direct vision into the oropharynx and esophagus.  The esophagus was straight and of normal caliber, with the squamocolumnar line at approximately 37 cm.  It was somewhat irregular, with a sawtoothed configuration, but no discrete erosions or exudate and no stricture or ring.  There appeared to be an approximately 1 cm sliding hiatal hernia with a somewhat patchulous gastroesophageal junction beyond the Z-line.  The stomach was entered and a small amount of liquid secretions were suctioned from the fundus.  Retroflexed view of the cardia was unremarkable.  The fundus body, antrum and pylorus all appeared normal.  Duodenum was entered in both the bulb and second portion were well inspected and appeared to be within normal limits.  The endoscope was withdrawn and the patient returned to the recovery room in stable condition.  She tolerated the procedure well and there were no immediate complications.  IMPRESSION:  Small hiatal hernia with mild esophagitis and patchulous gastroesophageal  junction.  PLAN:  Continue Nexium and dietary and lifestyle modifications for reflux. Will follow up in the office in six months. DD:  06/13/00 TD:  06/14/00 Job: [id number]

## 2010-09-27 ENCOUNTER — Other Ambulatory Visit: Payer: Self-pay | Admitting: Internal Medicine

## 2010-10-06 ENCOUNTER — Ambulatory Visit (INDEPENDENT_AMBULATORY_CARE_PROVIDER_SITE_OTHER): Payer: Medicare Other | Admitting: *Deleted

## 2010-10-06 DIAGNOSIS — I4891 Unspecified atrial fibrillation: Secondary | ICD-10-CM

## 2010-10-06 LAB — POCT INR: INR: 2

## 2010-10-22 ENCOUNTER — Encounter: Payer: Self-pay | Admitting: Gastroenterology

## 2010-11-03 ENCOUNTER — Ambulatory Visit (INDEPENDENT_AMBULATORY_CARE_PROVIDER_SITE_OTHER): Payer: Medicare Other | Admitting: *Deleted

## 2010-11-03 DIAGNOSIS — I4891 Unspecified atrial fibrillation: Secondary | ICD-10-CM

## 2010-11-03 LAB — POCT INR: INR: 2.5

## 2010-11-23 ENCOUNTER — Other Ambulatory Visit: Payer: Self-pay | Admitting: Internal Medicine

## 2010-11-29 ENCOUNTER — Other Ambulatory Visit: Payer: Self-pay | Admitting: Internal Medicine

## 2010-12-01 ENCOUNTER — Ambulatory Visit (INDEPENDENT_AMBULATORY_CARE_PROVIDER_SITE_OTHER): Payer: Medicare Other | Admitting: Internal Medicine

## 2010-12-01 ENCOUNTER — Other Ambulatory Visit (INDEPENDENT_AMBULATORY_CARE_PROVIDER_SITE_OTHER): Payer: Medicare Other

## 2010-12-01 ENCOUNTER — Encounter: Payer: Self-pay | Admitting: Internal Medicine

## 2010-12-01 ENCOUNTER — Ambulatory Visit (INDEPENDENT_AMBULATORY_CARE_PROVIDER_SITE_OTHER): Payer: Medicare Other | Admitting: *Deleted

## 2010-12-01 DIAGNOSIS — E785 Hyperlipidemia, unspecified: Secondary | ICD-10-CM

## 2010-12-01 DIAGNOSIS — E039 Hypothyroidism, unspecified: Secondary | ICD-10-CM

## 2010-12-01 DIAGNOSIS — J0100 Acute maxillary sinusitis, unspecified: Secondary | ICD-10-CM

## 2010-12-01 DIAGNOSIS — I4891 Unspecified atrial fibrillation: Secondary | ICD-10-CM

## 2010-12-01 DIAGNOSIS — J309 Allergic rhinitis, unspecified: Secondary | ICD-10-CM

## 2010-12-01 DIAGNOSIS — I10 Essential (primary) hypertension: Secondary | ICD-10-CM

## 2010-12-01 DIAGNOSIS — J45909 Unspecified asthma, uncomplicated: Secondary | ICD-10-CM

## 2010-12-01 LAB — CBC WITH DIFFERENTIAL/PLATELET
Eosinophils Relative: 7.8 % — ABNORMAL HIGH (ref 0.0–5.0)
Lymphocytes Relative: 23.6 % (ref 12.0–46.0)
Monocytes Absolute: 0.7 10*3/uL (ref 0.1–1.0)
Monocytes Relative: 7.9 % (ref 3.0–12.0)
Neutrophils Relative %: 60 % (ref 43.0–77.0)
Platelets: 328 10*3/uL (ref 150.0–400.0)
WBC: 8.5 10*3/uL (ref 4.5–10.5)

## 2010-12-01 LAB — COMPREHENSIVE METABOLIC PANEL
Albumin: 4 g/dL (ref 3.5–5.2)
BUN: 18 mg/dL (ref 6–23)
CO2: 29 mEq/L (ref 19–32)
GFR: 72.79 mL/min (ref >60.00)
Glucose, Bld: 107 mg/dL — ABNORMAL HIGH (ref 70–99)
Potassium: 3.6 mEq/L (ref 3.5–5.1)
Sodium: 140 mEq/L (ref 135–145)
Total Bilirubin: 0.4 mg/dL (ref 0.3–1.2)
Total Protein: 7.7 g/dL (ref 6.0–8.3)

## 2010-12-01 LAB — LIPID PANEL
Cholesterol: 166 mg/dL (ref 0–200)
LDL Cholesterol: 67 mg/dL (ref 0–99)
Triglycerides: 147 mg/dL (ref 0.0–149.0)
VLDL: 29.4 mg/dL (ref 0.0–40.0)

## 2010-12-01 LAB — POCT INR: INR: 2

## 2010-12-01 MED ORDER — BUDESONIDE-FORMOTEROL FUMARATE 160-4.5 MCG/ACT IN AERO: 2.0000 | INHALATION_SPRAY | Freq: Two times a day (BID) | RESPIRATORY_TRACT | Status: DC

## 2010-12-01 MED ORDER — CEFUROXIME AXETIL 500 MG PO TABS: 500.0000 mg | ORAL_TABLET | Freq: Two times a day (BID) | ORAL | Status: AC

## 2010-12-01 NOTE — Assessment & Plan Note (Signed)
BP is well controlled, I will check her lytes and renal function today 

## 2010-12-01 NOTE — Progress Notes (Signed)
Subjective:    Patient ID: [name] [name], female    DOB: May 09, 1940, 70 y.o.   MRN: [id number]  URI  This is a new problem. The current episode started 1 to 4 weeks ago. The problem has been gradually worsening. There has been no fever. Associated symptoms include congestion, rhinorrhea, sinus pain, sneezing, a sore throat and wheezing. Pertinent negatives include no abdominal pain, chest pain, coughing, diarrhea, dysuria, ear pain, headaches, joint swelling, nausea, neck pain, plugged ear sensation, rash, swollen glands or vomiting. She has tried inhaler use for the symptoms. The treatment provided mild relief.  Hypertension This is a chronic problem. The current episode started more than 1 year ago. The problem has been gradually improving since onset. The problem is controlled. Pertinent negatives include no anxiety, blurred vision, chest pain, headaches, malaise/fatigue, neck pain, palpitations, peripheral edema, PND, shortness of breath or sweats. There are no associated agents to hypertension. Past treatments include calcium channel blockers and diuretics. The current treatment provides moderate improvement. Compliance problems include exercise and diet.       Review of Systems  Constitutional: Negative for fever, chills, malaise/fatigue, diaphoresis, activity change, appetite change, fatigue and unexpected weight change.  HENT: Positive for congestion, sore throat, rhinorrhea, sneezing, postnasal drip and sinus pressure. Negative for hearing loss, ear pain, nosebleeds, facial swelling, drooling, mouth sores, trouble swallowing, neck pain, neck stiffness, dental problem, voice change, tinnitus and ear discharge.   Eyes: Negative for blurred vision, photophobia, pain, discharge, redness, itching and visual disturbance.  Respiratory: Positive for wheezing. Negative for apnea, cough, choking, chest tightness, shortness of breath and stridor.   Cardiovascular: Negative for chest pain,  palpitations, leg swelling and PND.  Gastrointestinal: Negative for nausea, vomiting, abdominal pain, diarrhea and blood in stool.  Genitourinary: Negative for dysuria, urgency, frequency, hematuria, flank pain, decreased urine volume, enuresis, difficulty urinating and dyspareunia.  Musculoskeletal: Positive for arthralgias (knees and hips). Negative for myalgias, back pain, joint swelling and gait problem.  Skin: Negative for color change, pallor and rash.  Neurological: Negative for dizziness, tremors, seizures, syncope, facial asymmetry, speech difficulty, weakness, light-headedness, numbness and headaches.  Hematological: Negative for adenopathy. Does not bruise/bleed easily.  Psychiatric/Behavioral: Negative.        Objective:   Physical Exam  Vitals reviewed. Constitutional: She is oriented to person, place, and time. She appears well-developed and well-nourished. No distress.  HENT:  Head: Normocephalic and atraumatic. No trismus in the jaw.  Right Ear: Hearing, tympanic membrane, external ear and ear canal normal. Tympanic membrane is not injected and not erythematous.  Left Ear: Hearing, tympanic membrane, external ear and ear canal normal. Tympanic membrane is not injected and not erythematous.  Nose: Nose normal. No mucosal edema, rhinorrhea, nose lacerations, sinus tenderness, nasal deformity, septal deviation or nasal septal hematoma. No epistaxis.  No foreign bodies. Right sinus exhibits no maxillary sinus tenderness and no frontal sinus tenderness. Left sinus exhibits no maxillary sinus tenderness and no frontal sinus tenderness.  Mouth/Throat: Oropharynx is clear and moist and mucous membranes are normal. Mucous membranes are not pale, not dry and not cyanotic. No uvula swelling. No oropharyngeal exudate, posterior oropharyngeal edema, posterior oropharyngeal erythema or tonsillar abscesses.  Eyes: Conjunctivae and EOM are normal. Pupils are equal, round, and reactive to light.  Right eye exhibits no discharge. Left eye exhibits no discharge. No scleral icterus.  Neck: Normal range of motion. Neck supple. No JVD present. No tracheal deviation present. No thyromegaly present.  Cardiovascular: Normal rate, regular rhythm,  normal heart sounds and intact distal pulses.  Exam reveals no gallop and no friction rub.   No murmur heard. Pulmonary/Chest: Effort normal. No stridor. Not tachypneic. No respiratory distress. She has no decreased breath sounds. She has wheezes in the right middle field and the left middle field. She has no rhonchi. She has no rales. She exhibits no tenderness.  Abdominal: Soft. Bowel sounds are normal. She exhibits no distension and no mass. There is no tenderness. There is no rebound and no guarding.  Musculoskeletal: Normal range of motion. She exhibits no edema and no tenderness.  Lymphadenopathy:    She has no cervical adenopathy.  Neurological: She is alert and oriented to person, place, and time. She has normal reflexes. She displays normal reflexes. No cranial nerve deficit. She exhibits normal muscle tone. Coordination normal.  Skin: Skin is warm and dry. No rash noted. She is not diaphoretic. No erythema. No pallor.  Psychiatric: She has a normal mood and affect. Her behavior is normal. Judgment and thought content normal.     Lab Results  Component Value Date   WBC 7.6 07/21/2010   HGB 12.9 07/21/2010   HCT 38.1 07/21/2010   PLT 285.0 07/21/2010   CHOL 168 07/01/2009   TRIG 99.0 07/01/2009   HDL 72.50 07/01/2009   ALT 25 07/01/2009   AST 25 07/01/2009   NA 139 07/21/2010   K 3.9 07/21/2010   CL 105 07/21/2010   CREATININE 0.8 07/21/2010   BUN 17 07/21/2010   CO2 30 07/21/2010   TSH 4.06 07/01/2009   INR 2.0 12/01/2010   HGBA1C 6.2 09/10/2008       Assessment & Plan:

## 2010-12-01 NOTE — Patient Instructions (Signed)
Hypertension (High Blood Pressure) As your heart beats, it forces blood through your arteries. This force is your blood pressure. If the pressure is too high, it is called hypertension (HTN) or high blood pressure. HTN is dangerous because you may have it and not know it. High blood pressure may mean that your heart has to work harder to pump blood. Your arteries may be narrow or stiff. The extra work puts you at risk for heart disease, stroke, and other problems.  Blood pressure consists of two numbers, a higher number over a lower, 110/72, for example. It is stated as "110 over 72." The ideal is below 120 for the top number (systolic) and under 80 for the bottom (diastolic). Write down your blood pressure today. You should pay close attention to your blood pressure if you have certain conditions such as:  Heart failure.  Prior heart attack.   Diabetes   Chronic kidney disease.   Prior stroke.   Multiple risk factors for heart disease.   To see if you have HTN, your blood pressure should be measured while you are seated with your arm held at the level of the heart. It should be measured at least twice. A one-time elevated blood pressure reading (especially in the Emergency Department) does not mean that you need treatment. There may be conditions in which the blood pressure is different between your right and left arms. It is important to see your caregiver soon for a recheck. Most people have essential hypertension which means that there is not a specific cause. This type of high blood pressure may be lowered by changing lifestyle factors such as:  Stress.  Smoking.   Lack of exercise.   Excessive weight.  Drug/tobacco/alcohol use.   Eating less salt.   Most people do not have symptoms from high blood pressure until it has caused damage to the body. Effective treatment can often prevent, delay or reduce that damage. TREATMENT Treatment for high blood pressure, when a cause has been  identified, is directed at the cause. There are a large number of medications to treat HTN. These fall into several categories, and your caregiver will help you select the medicines that are best for you. Medications may have side effects. You should review side effects with your caregiver. If your blood pressure stays high after you have made lifestyle changes or started on medicines,   Your medication(s) may need to be changed.   Other problems may need to be addressed.   Be certain you understand your prescriptions, and know how and when to take your medicine.   Be sure to follow up with your caregiver within the time frame advised (usually within two weeks) to have your blood pressure rechecked and to review your medications.   If you are taking more than one medicine to lower your blood pressure, make sure you know how and at what times they should be taken. Taking two medicines at the same time can result in blood pressure that is too low.  SEEK IMMEDIATE MEDICAL CARE IF YOU DEVELOP:  A severe headache, blurred or changing vision, or confusion.   Unusual weakness or numbness, or a faint feeling.   Severe chest or abdominal pain, vomiting, or breathing problems.  MAKE SURE YOU:   Understand these instructions.   Will watch your condition.   Will get help right away if you are not doing well or get worse.  Document Released: 04/11/2005 Document Re-Released: 09/29/2009 [employer] Patient Information 2011 [employer].Asthma, Adult Asthma is caused by narrowing of the air passages in the lungs. It may be triggered by pollen, dust, animal dander, molds, some foods, respiratory infections, exposure to smoke, exercise, emotional stress or other allergens (things that cause allergic reactions or allergies). Repeat attacks are common. HOME CARE INSTRUCTIONS  Use prescription medications as ordered by your caregiver.   Avoid pollen, dust, animal dander, molds, smoke and other things  that cause attacks at home and at work.   You may have fewer attacks if you decrease dust in your home. Electrostatic air cleaners may help.   It may help to replace your pillows or mattress with materials less likely to cause allergies.   Talk to your caregiver about an action plan for managing asthma attacks at home, including, the use of a peak flow meter which measures the severity of your asthma attack. An action plan can help minimize or stop the attack without having to seek medical care.   If you are not on a fluid restriction, drink 8 to 10 glasses of water each day.   Always have a plan prepared for seeking medical attention, including, calling your physician, accessing local emergency care, and calling 911 (in the [place] for a severe attack.   Discuss possible exercise routines with your caregiver.   If animal dander is the cause of asthma, you may need to get rid of pets.  SEEK MEDICAL CARE IF:  You have wheezing and shortness of breath even if taking medicine to prevent attacks.   An oral temperature above 100.5 develops.   You have muscle aches, chest pain or thickening of sputum.   Your sputum changes from clear or white to yellow, green, gray or bloody.   You have any problems that may be related to the medicine you are taking (such as a rash, itching, swelling or trouble breathing).  SEEK IMMEDIATE MEDICAL CARE IF:  Your usual medicines do not stop your wheezing or there is increased coughing and/or shortness of breath.   You have increased difficulty breathing.   You have an oral temperature above 100.5, not controlled by medicine.  MAKE SURE YOU:  Understand these instructions.   Will watch your condition.   Will get help right away if you are not doing well or get worse.  Document Released: 04/11/2005 Document Re-Released: 05/03/2009 [facility] [place] Patient Information 2011 [place], [place].

## 2010-12-01 NOTE — Assessment & Plan Note (Signed)
I will check her TSH level

## 2010-12-01 NOTE — Assessment & Plan Note (Signed)
Start ceftin for the infection 

## 2010-12-01 NOTE — Assessment & Plan Note (Signed)
She got an injection of depo-medrol IM for sympt relief and will try symbicort to see if it controls her symptoms better than the advair

## 2010-12-02 ENCOUNTER — Encounter: Payer: Self-pay | Admitting: Internal Medicine

## 2010-12-02 MED ORDER — METHYLPREDNISOLONE ACETATE 80 MG/ML IJ SUSP
120.0000 mg | Freq: Once | INTRAMUSCULAR | Status: AC
  Administered 2010-12-02: 120 mg via INTRAMUSCULAR

## 2010-12-02 NOTE — Progress Notes (Signed)
Addended by: Rock Nephew T on: 12/02/2010 08:49 AM   Modules accepted: Orders

## 2010-12-29 ENCOUNTER — Ambulatory Visit (INDEPENDENT_AMBULATORY_CARE_PROVIDER_SITE_OTHER): Payer: Medicare Other | Admitting: *Deleted

## 2010-12-29 DIAGNOSIS — I4891 Unspecified atrial fibrillation: Secondary | ICD-10-CM

## 2010-12-29 LAB — POCT INR: INR: 2.2

## 2011-01-21 LAB — DIFFERENTIAL
Basophils Absolute: 0
Basophils Absolute: 0
Basophils Relative: 0
Eosinophils Absolute: 0
Eosinophils Relative: 0
Lymphocytes Relative: 10 — ABNORMAL LOW
Monocytes Absolute: 0.6
Neutro Abs: 6
Neutrophils Relative %: 67

## 2011-01-21 LAB — CBC
HCT: 26 — ABNORMAL LOW
HCT: 26.3 — ABNORMAL LOW
HCT: 26.5 — ABNORMAL LOW
HCT: 27.6 — ABNORMAL LOW
HCT: 38.5
HCT: 30 — ABNORMAL LOW
Hemoglobin: 13.1
Hemoglobin: 8.8 — ABNORMAL LOW
Hemoglobin: 9.1 — ABNORMAL LOW
Hemoglobin: 9.8 — ABNORMAL LOW
Hemoglobin: 8.5 — ABNORMAL LOW
MCHC: 33.1
MCHC: 33.6
MCHC: 33.6
MCHC: 34
MCHC: 33.1
MCV: 89.1
MCV: 89.2
MCV: 90.6
MCV: 90.8
Platelets: 212
Platelets: 259
Platelets: 321
Platelets: 422 — ABNORMAL HIGH
RBC: 2.9 — ABNORMAL LOW
RBC: 2.91 — ABNORMAL LOW
RBC: 2.94 — ABNORMAL LOW
RBC: 2.97 — ABNORMAL LOW
RBC: 3.04 — ABNORMAL LOW
RBC: 3.22 — ABNORMAL LOW
RBC: 2.81 — ABNORMAL LOW
RDW: 14.8
RDW: 15.2
RDW: 14.9
WBC: 10
WBC: 7.9
WBC: 8.1
WBC: 9.3
WBC: 9.6
WBC: 8.5

## 2011-01-21 LAB — URINALYSIS, MICROSCOPIC ONLY
Glucose, UA: NEGATIVE
Hgb urine dipstick: NEGATIVE
Ketones, ur: >80 — AB
Leukocytes, UA: NEGATIVE
Nitrite: NEGATIVE
Protein, ur: 30 — AB
Specific Gravity, Urine: 1.029
Urobilinogen, UA: 1
pH: 6.5

## 2011-01-21 LAB — PROTIME-INR
INR: 1.1
INR: 1.3
INR: 1.4
INR: 1.6 — ABNORMAL HIGH
INR: 1.6 — ABNORMAL HIGH
Prothrombin Time: 17.7 — ABNORMAL HIGH
Prothrombin Time: 21.7 — ABNORMAL HIGH

## 2011-01-21 LAB — BASIC METABOLIC PANEL
BUN: 7
BUN: 8
BUN: 9
CO2: 25
CO2: 29
CO2: 29
Calcium: 8.2 — ABNORMAL LOW
Calcium: 8 — ABNORMAL LOW
Chloride: 101
Chloride: 102
Chloride: 102
Chloride: 96
Chloride: 98
Creatinine, Ser: 0.7
GFR calc Af Amer: >60
GFR calc Af Amer: >60
GFR calc Af Amer: >60
GFR calc non Af Amer: >60
GFR calc non Af Amer: >60
Glucose, Bld: 111 — ABNORMAL HIGH
Potassium: 3.1 — ABNORMAL LOW
Potassium: 3.5
Potassium: 3.5
Potassium: 3.7
Sodium: 131 — ABNORMAL LOW
Sodium: 134 — ABNORMAL LOW
Sodium: 135
Sodium: 134 — ABNORMAL LOW

## 2011-01-21 LAB — URINALYSIS, ROUTINE W REFLEX MICROSCOPIC
Bilirubin Urine: NEGATIVE
Glucose, UA: NEGATIVE
Ketones, ur: NEGATIVE
Ketones, ur: NEGATIVE
Nitrite: NEGATIVE
Protein, ur: NEGATIVE
Urobilinogen, UA: 0.2

## 2011-01-21 LAB — COMPREHENSIVE METABOLIC PANEL
ALT: 17
AST: 17
Albumin: 2.7 — ABNORMAL LOW
Alkaline Phosphatase: 61
Alkaline Phosphatase: 81
BUN: 12
CO2: 26
Chloride: 105
Chloride: 99
Creatinine, Ser: 0.72
GFR calc Af Amer: >60
GFR calc non Af Amer: >60
Glucose, Bld: 108 — ABNORMAL HIGH
Potassium: 3.7
Potassium: 4
Sodium: 133 — ABNORMAL LOW
Total Bilirubin: 0.8
Total Bilirubin: 0.9
Total Protein: 6.6

## 2011-01-21 LAB — APTT
aPTT: 23 — ABNORMAL LOW
aPTT: 41 — ABNORMAL HIGH

## 2011-01-21 LAB — CULTURE, BLOOD (ROUTINE X 2): Culture: NO GROWTH

## 2011-01-21 LAB — CROSSMATCH: Antibody Screen: NEGATIVE

## 2011-01-21 LAB — URINE CULTURE
Colony Count: 8000
Colony Count: NO GROWTH
Colony Count: NO GROWTH
Culture: NO GROWTH
Special Requests: NEGATIVE

## 2011-01-21 LAB — OCCULT BLOOD X 1 CARD TO LAB, STOOL: Fecal Occult Bld: NEGATIVE

## 2011-01-24 ENCOUNTER — Other Ambulatory Visit: Payer: Self-pay | Admitting: Cardiology

## 2011-01-24 DIAGNOSIS — I4891 Unspecified atrial fibrillation: Secondary | ICD-10-CM

## 2011-01-24 MED ORDER — WARFARIN SODIUM 5 MG PO TABS: ORAL_TABLET | ORAL | Status: DC

## 2011-01-26 ENCOUNTER — Ambulatory Visit (INDEPENDENT_AMBULATORY_CARE_PROVIDER_SITE_OTHER): Payer: Medicare Other | Admitting: *Deleted

## 2011-01-26 DIAGNOSIS — I4891 Unspecified atrial fibrillation: Secondary | ICD-10-CM

## 2011-02-02 ENCOUNTER — Ambulatory Visit (INDEPENDENT_AMBULATORY_CARE_PROVIDER_SITE_OTHER): Payer: Medicare Other | Admitting: Internal Medicine

## 2011-02-02 ENCOUNTER — Encounter: Payer: Self-pay | Admitting: Internal Medicine

## 2011-02-02 ENCOUNTER — Other Ambulatory Visit (INDEPENDENT_AMBULATORY_CARE_PROVIDER_SITE_OTHER): Payer: Medicare Other

## 2011-02-02 VITALS — BP 130/64 | HR 62 | Temp 98.6°F | Resp 16 | Wt 229.0 lb

## 2011-02-02 DIAGNOSIS — R739 Hyperglycemia, unspecified: Secondary | ICD-10-CM

## 2011-02-02 DIAGNOSIS — E039 Hypothyroidism, unspecified: Secondary | ICD-10-CM

## 2011-02-02 DIAGNOSIS — R7309 Other abnormal glucose: Secondary | ICD-10-CM

## 2011-02-02 DIAGNOSIS — I4891 Unspecified atrial fibrillation: Secondary | ICD-10-CM

## 2011-02-02 DIAGNOSIS — I10 Essential (primary) hypertension: Secondary | ICD-10-CM

## 2011-02-02 DIAGNOSIS — J45909 Unspecified asthma, uncomplicated: Secondary | ICD-10-CM

## 2011-02-02 DIAGNOSIS — J0100 Acute maxillary sinusitis, unspecified: Secondary | ICD-10-CM

## 2011-02-02 LAB — COMPREHENSIVE METABOLIC PANEL
Albumin: 4 g/dL (ref 3.5–5.2)
BUN: 19 mg/dL (ref 6–23)
CO2: 29 mEq/L (ref 19–32)
Calcium: 8.7 mg/dL (ref 8.4–10.5)
Chloride: 101 mEq/L (ref 96–112)
GFR: 95.12 mL/min (ref >60.00)
Glucose, Bld: 111 mg/dL — ABNORMAL HIGH (ref 70–99)
Potassium: 3.8 mEq/L (ref 3.5–5.1)

## 2011-02-02 LAB — TSH: TSH: 4.55 u[IU]/mL (ref 0.35–5.50)

## 2011-02-02 MED ORDER — CEFUROXIME AXETIL 500 MG PO TABS: 500.0000 mg | ORAL_TABLET | Freq: Two times a day (BID) | ORAL | Status: AC

## 2011-02-02 MED ORDER — CICLESONIDE 50 MCG/ACT NA SUSP: 2.0000 | Freq: Every day | NASAL | Status: DC

## 2011-02-02 MED ORDER — BUDESONIDE-FORMOTEROL FUMARATE 160-4.5 MCG/ACT IN AERO: 2.0000 | INHALATION_SPRAY | Freq: Two times a day (BID) | RESPIRATORY_TRACT | Status: DC

## 2011-02-02 NOTE — Assessment & Plan Note (Signed)
Restart ceftin

## 2011-02-02 NOTE — Assessment & Plan Note (Signed)
Her BP is well controlled 

## 2011-02-02 NOTE — Patient Instructions (Signed)
Asthma, Adult Asthma is caused by narrowing of the air passages in the lungs. It may be triggered by pollen, dust, animal dander, molds, some foods, respiratory infections, exposure to smoke, exercise, emotional stress or other allergens (things that cause allergic reactions or allergies). Repeat attacks are common. HOME CARE INSTRUCTIONS  Use prescription medications as ordered by your caregiver.   Avoid pollen, dust, animal dander, molds, smoke and other things that cause attacks at home and at work.   You may have fewer attacks if you decrease dust in your home. Electrostatic air cleaners may help.   It may help to replace your pillows or mattress with materials less likely to cause allergies.   Talk to your caregiver about an action plan for managing asthma attacks at home, including, the use of a peak flow meter which measures the severity of your asthma attack. An action plan can help minimize or stop the attack without having to seek medical care.   If you are not on a fluid restriction, drink 8 to 10 glasses of water each day.   Always have a plan prepared for seeking medical attention, including, calling your physician, accessing local emergency care, and calling 911 (in the [place] for a severe attack.   Discuss possible exercise routines with your caregiver.   If animal dander is the cause of asthma, you may need to get rid of pets.  SEEK MEDICAL CARE IF:  You have wheezing and shortness of breath even if taking medicine to prevent attacks.   An oral temperature above 100.5 develops.   You have muscle aches, chest pain or thickening of sputum.   Your sputum changes from clear or white to yellow, green, gray or bloody.   You have any problems that may be related to the medicine you are taking (such as a rash, itching, swelling or trouble breathing).  SEEK IMMEDIATE MEDICAL CARE IF:  Your usual medicines do not stop your wheezing or there is increased coughing and/or  shortness of breath.   You have increased difficulty breathing.   You have an oral temperature above 100.5, not controlled by medicine.  MAKE SURE YOU:  Understand these instructions.   Will watch your condition.   Will get help right away if you are not doing well or get worse.  Document Released: 04/11/2005 Document Re-Released: 05/03/2009 [facility] Patient Information 2011 [place], [place].Hypothyroidism The thyroid is a large gland located in the lower front of your neck. The thyroid gland helps control metabolism. Metabolism is how your body handles food. It controls metabolism with the hormone thyroxine. When this gland is underactive (hypothyroid), it produces too little hormone.  SYMPTOMS OF HYPOTHYROIDISM  Lethargy (feeling as though you have no energy)   Cold intolerance   Weight gain (in spite of normal food intake)   Dry skin   Coarse hair   Menstrual irregularity (if severe, may lead to infertility)   Slowing of thought processes  Cardiac problems are also caused by insufficient amounts of thyroid hormone. Hypothyroidism in the newborn is cretinism, and is an extreme form. It is important that this form be treated adequately and immediately or it will lead rapidly to retarded physical and mental development. CAUSES OF HYPOTHYROIDISM These include:   Absence or destruction of thyroid tissue.  Goiter due to iodine deficiency.   Goiter due to medications.   Congenital defects (since birth).  Problems with the pituitary. This causes a lack of TSH (thyroid stimulating hormone). This hormone tells the thyroid to  turn out more hormone.   DIAGNOSIS To prove hypothyroidism, your caregiver may do blood tests and ultrasound tests. Sometimes the signs are hidden. It may be necessary for your caregiver to watch this illness with blood tests either before or after diagnosis and treatment. TREATMENT  Low levels of thyroid hormone are increased by using synthetic thyroid  hormone. This is a safe, effective treatment. It usually takes about four weeks to gain the full effects of the medication. After you have the full effect of the medication, it will generally take another four weeks for problems to leave. Your caregiver may start you on low doses. If you have had heart problems the dose may be gradually increased. It is generally not an emergency to get rapidly to normal. HOME CARE INSTRUCTIONS  Take your medications as your caregiver suggests. Let your caregiver know of any medications you are taking or start taking. Your caregiver will help you with dosage schedules.   As your condition improves, your dosage needs may increase. It will be necessary to have continuing blood tests as suggested by your caregiver.   Report all suspected medication side effects to your caregiver.  SEEK MEDICAL CARE IF YOU DEVELOP:  Sweating.  Tremulousness (tremors).   Anxiety.   Rapid weight loss.   Heat intolerance.  Emotional swings.   Diarrhea.   Weakness.   SEEK IMMEDIATE MEDICAL CARE IF: You develop chest pain, an irregular heart beat (palpitations), or a rapid heart beat. MAKE SURE YOU:   Understand these instructions.   Will watch your condition.   Will get help right away if you are not doing well or get worse.  Document Released: 04/11/2005 Document Re-Released: 03/24/2008 [facility] Patient Information 2011 [place], [place].

## 2011-02-02 NOTE — Assessment & Plan Note (Signed)
She has good rate and rhythm control 

## 2011-02-02 NOTE — Assessment & Plan Note (Signed)
I will check her A1C, she may need meds for DM

## 2011-02-02 NOTE — Progress Notes (Signed)
Subjective:    Patient ID: [name] [name], female    DOB: September 24, 1940, 70 y.o.   MRN: [id number]  URI  This is a new problem. The current episode started 1 to 4 weeks ago. The problem has been gradually worsening. There has been no fever. Associated symptoms include congestion, coughing, sinus pain, sneezing and a sore throat. Pertinent negatives include no abdominal pain, chest pain, diarrhea, dysuria, ear pain, headaches, joint pain, joint swelling, nausea, neck pain, plugged ear sensation, rash, rhinorrhea, swollen glands, vomiting or wheezing. She has tried nothing for the symptoms. The treatment provided no relief.  Thyroid Problem Presents for follow-up visit. Symptoms include fatigue. Patient reports no anxiety, cold intolerance, constipation, depressed mood, diaphoresis, diarrhea, dry skin, hair loss, heat intolerance, hoarse voice, leg swelling, menstrual problem, nail problem, palpitations, tremors, visual change, weight gain or weight loss. The symptoms have been stable.      Review of Systems  Constitutional: Positive for fatigue. Negative for fever, chills, weight loss, weight gain, diaphoresis, activity change, appetite change and unexpected weight change.  HENT: Positive for congestion, sore throat, sneezing, postnasal drip and sinus pressure. Negative for hearing loss, ear pain, nosebleeds, hoarse voice, facial swelling, rhinorrhea, drooling, trouble swallowing, neck pain, neck stiffness, dental problem, voice change, tinnitus and ear discharge.   Eyes: Negative.   Respiratory: Positive for cough. Negative for apnea, choking, chest tightness, shortness of breath, wheezing and stridor.   Cardiovascular: Negative for chest pain, palpitations and leg swelling.  Gastrointestinal: Negative for nausea, vomiting, abdominal pain, diarrhea, constipation and blood in stool.  Genitourinary: Negative for dysuria, urgency, frequency, hematuria, enuresis, difficulty urinating, menstrual  problem and dyspareunia.  Musculoskeletal: Negative for myalgias, back pain, joint pain, joint swelling, arthralgias and gait problem.  Skin: Negative for color change, pallor, rash and wound.  Neurological: Negative.  Negative for tremors and headaches.  Hematological: Negative for cold intolerance, heat intolerance and adenopathy. Does not bruise/bleed easily.  Psychiatric/Behavioral: Negative.        Objective:   Physical Exam  Vitals reviewed. Constitutional: She is oriented to person, place, and time. She appears well-developed and well-nourished. No distress.  HENT:  Mouth/Throat: Oropharynx is clear and moist. No oropharyngeal exudate.  Eyes: Conjunctivae are normal. Right eye exhibits no discharge. Left eye exhibits no discharge. No scleral icterus.  Neck: Normal range of motion. Neck supple. No JVD present. No tracheal deviation present. No thyromegaly present.  Cardiovascular: Normal rate, regular rhythm, normal heart sounds and intact distal pulses.  Exam reveals no gallop and no friction rub.   No murmur heard. Pulmonary/Chest: Effort normal and breath sounds normal. No stridor. No respiratory distress. She has no wheezes. She has no rales. She exhibits no tenderness.  Abdominal: Soft. Bowel sounds are normal. She exhibits no distension and no mass. There is no tenderness. There is no rebound and no guarding.  Musculoskeletal: Normal range of motion. She exhibits no edema and no tenderness.  Lymphadenopathy:    She has no cervical adenopathy.  Neurological: She is oriented to person, place, and time. She displays normal reflexes. No cranial nerve deficit. She exhibits normal muscle tone. Coordination normal.  Skin: Skin is warm and dry. No rash noted. She is not diaphoretic. No erythema. No pallor.  Psychiatric: She has a normal mood and affect. Her behavior is normal. Judgment and thought content normal.     Lab Results  Component Value Date   WBC 8.5 12/01/2010   HGB 13.3  12/01/2010   HCT 39.2  12/01/2010   PLT 328.0 12/01/2010   GLUCOSE 107* 12/01/2010   CHOL 166 12/01/2010   TRIG 147.0 12/01/2010   HDL 69.90 12/01/2010   LDLCALC 67 12/01/2010   ALT 24 12/01/2010   AST 22 12/01/2010   NA 140 12/01/2010   K 3.6 12/01/2010   CL 102 12/01/2010   CREATININE 0.8 12/01/2010   BUN 18 12/01/2010   CO2 29 12/01/2010   TSH 4.19 12/01/2010   INR 1.9 01/26/2011   HGBA1C 6.2 09/10/2008       Assessment & Plan:

## 2011-02-02 NOTE — Assessment & Plan Note (Signed)
I will check her TSH today 

## 2011-02-02 NOTE — Assessment & Plan Note (Signed)
Continue symbicort 

## 2011-02-11 ENCOUNTER — Telehealth: Payer: Self-pay

## 2011-02-11 NOTE — Telephone Encounter (Signed)
Patient called [name] [name] c/o continued URI symptoms after completing first round of antibiotic. She would like to know if MD suggest she completes another round (1 refill left) or what he recommends. Thanks

## 2011-02-11 NOTE — Telephone Encounter (Signed)
yes

## 2011-02-11 NOTE — Telephone Encounter (Signed)
Patient notified per MD.

## 2011-02-23 ENCOUNTER — Ambulatory Visit (INDEPENDENT_AMBULATORY_CARE_PROVIDER_SITE_OTHER): Payer: Medicare Other | Admitting: *Deleted

## 2011-02-23 DIAGNOSIS — I4891 Unspecified atrial fibrillation: Secondary | ICD-10-CM

## 2011-02-23 LAB — POCT INR: INR: 2

## 2011-03-16 ENCOUNTER — Ambulatory Visit (INDEPENDENT_AMBULATORY_CARE_PROVIDER_SITE_OTHER): Payer: Medicare Other | Admitting: *Deleted

## 2011-03-16 DIAGNOSIS — I4891 Unspecified atrial fibrillation: Secondary | ICD-10-CM

## 2011-04-13 ENCOUNTER — Ambulatory Visit (INDEPENDENT_AMBULATORY_CARE_PROVIDER_SITE_OTHER): Payer: Medicare Other | Admitting: *Deleted

## 2011-04-13 DIAGNOSIS — I4891 Unspecified atrial fibrillation: Secondary | ICD-10-CM

## 2011-05-04 ENCOUNTER — Encounter: Payer: Self-pay | Admitting: Internal Medicine

## 2011-05-04 ENCOUNTER — Ambulatory Visit (INDEPENDENT_AMBULATORY_CARE_PROVIDER_SITE_OTHER): Payer: Medicare Other | Admitting: Internal Medicine

## 2011-05-04 ENCOUNTER — Other Ambulatory Visit (INDEPENDENT_AMBULATORY_CARE_PROVIDER_SITE_OTHER): Payer: Medicare Other

## 2011-05-04 DIAGNOSIS — E039 Hypothyroidism, unspecified: Secondary | ICD-10-CM

## 2011-05-04 DIAGNOSIS — I10 Essential (primary) hypertension: Secondary | ICD-10-CM

## 2011-05-04 DIAGNOSIS — J45909 Unspecified asthma, uncomplicated: Secondary | ICD-10-CM

## 2011-05-04 DIAGNOSIS — Z23 Encounter for immunization: Secondary | ICD-10-CM | POA: Diagnosis not present

## 2011-05-04 DIAGNOSIS — E118 Type 2 diabetes mellitus with unspecified complications: Secondary | ICD-10-CM | POA: Insufficient documentation

## 2011-05-04 DIAGNOSIS — E785 Hyperlipidemia, unspecified: Secondary | ICD-10-CM

## 2011-05-04 DIAGNOSIS — I4891 Unspecified atrial fibrillation: Secondary | ICD-10-CM

## 2011-05-04 DIAGNOSIS — IMO0001 Reserved for inherently not codable concepts without codable children: Secondary | ICD-10-CM | POA: Diagnosis not present

## 2011-05-04 LAB — CBC WITH DIFFERENTIAL/PLATELET
Basophils Absolute: 0 10*3/uL (ref 0.0–0.1)
Basophils Relative: 0.3 % (ref 0.0–3.0)
Eosinophils Absolute: 0.4 10*3/uL (ref 0.0–0.7)
Hemoglobin: 12.9 g/dL (ref 12.0–15.0)
Lymphocytes Relative: 22.1 % (ref 12.0–46.0)
MCHC: 33.9 g/dL (ref 30.0–36.0)
Monocytes Relative: 5.8 % (ref 3.0–12.0)
Neutro Abs: 5.6 10*3/uL (ref 1.4–7.7)
Neutrophils Relative %: 67.4 % (ref 43.0–77.0)
RBC: 4.15 Mil/uL (ref 3.87–5.11)
WBC: 8.3 10*3/uL (ref 4.5–10.5)

## 2011-05-04 LAB — COMPREHENSIVE METABOLIC PANEL
AST: 26 U/L (ref 0–37)
Albumin: 3.9 g/dL (ref 3.5–5.2)
Alkaline Phosphatase: 85 U/L (ref 39–117)
Calcium: 8.8 mg/dL (ref 8.4–10.5)
Chloride: 104 mEq/L (ref 96–112)
Glucose, Bld: 99 mg/dL (ref 70–99)
Potassium: 3.6 mEq/L (ref 3.5–5.1)
Sodium: 141 mEq/L (ref 135–145)
Total Protein: 7.3 g/dL (ref 6.0–8.3)

## 2011-05-04 MED ORDER — MONTELUKAST SODIUM 10 MG PO TABS: 10.0000 mg | ORAL_TABLET | Freq: Every day | ORAL | Status: DC

## 2011-05-04 MED ORDER — LEVOTHYROXINE SODIUM 25 MCG PO TABS: 25.0000 ug | ORAL_TABLET | Freq: Every day | ORAL | Status: DC

## 2011-05-04 NOTE — Assessment & Plan Note (Signed)
I will check her a1c today and see if she needs meds to treat DM II

## 2011-05-04 NOTE — Assessment & Plan Note (Signed)
She has not taken the synthroid for 3 months, she tells me that she ran out and did not inquire about a refill, I will check her TSH today

## 2011-05-04 NOTE — Assessment & Plan Note (Signed)
She has mild persistent symptoms so I have added singulair to her inhalers

## 2011-05-04 NOTE — Assessment & Plan Note (Signed)
She has good rate and rhythm control 

## 2011-05-04 NOTE — Assessment & Plan Note (Signed)
She is doing well on crestor, I will check her labs today 

## 2011-05-04 NOTE — Progress Notes (Signed)
Subjective:    Patient ID: [name] [name], female    DOB: 12-26-1940, 71 y.o.   MRN: [id number]  Asthma She complains of wheezing. There is no chest tightness, cough, difficulty breathing, frequent throat clearing, hemoptysis, hoarse voice, shortness of breath or sputum production. This is a chronic problem. The current episode started more than 1 year ago. The problem occurs intermittently. The problem has been unchanged. Pertinent negatives include no appetite change, chest pain, dyspnea on exertion, ear congestion, ear pain, fever, headaches, heartburn, malaise/fatigue, myalgias, nasal congestion, orthopnea, PND, postnasal drip, rhinorrhea, sneezing, sore throat, sweats, trouble swallowing or weight loss. Her symptoms are aggravated by nothing. Her symptoms are alleviated by beta-agonist and steroid inhaler. She reports significant improvement on treatment. Her past medical history is significant for asthma.  Hyperlipidemia This is a chronic problem. The current episode started more than 1 year ago. The problem is controlled. Recent lipid tests were reviewed and are variable. Exacerbating diseases include diabetes, hypothyroidism and obesity. She has no history of chronic renal disease, liver disease or nephrotic syndrome. Factors aggravating her hyperlipidemia include fatty foods. Pertinent negatives include no chest pain, focal sensory loss, focal weakness, leg pain, myalgias or shortness of breath. Current antihyperlipidemic treatment includes statins. Compliance problems include adherence to exercise and adherence to diet.       Review of Systems  Constitutional: Positive for unexpected weight change (weight gain). Negative for fever, chills, weight loss, malaise/fatigue, diaphoresis, activity change, appetite change and fatigue.  HENT: Negative for ear pain, congestion, sore throat, hoarse voice, facial swelling, rhinorrhea, sneezing, trouble swallowing, neck pain, neck stiffness,  postnasal drip and sinus pressure.   Eyes: Negative.   Respiratory: Positive for wheezing. Negative for apnea, cough, hemoptysis, sputum production, choking, chest tightness, shortness of breath and stridor.   Cardiovascular: Negative for chest pain, dyspnea on exertion, palpitations, leg swelling and PND.  Gastrointestinal: Negative.  Negative for heartburn.  Genitourinary: Negative for dysuria, urgency, frequency, hematuria, decreased urine volume, enuresis, difficulty urinating and dyspareunia.  Musculoskeletal: Negative for myalgias, back pain, joint swelling, arthralgias and gait problem.  Skin: Negative for color change, pallor, rash and wound.  Neurological: Negative for dizziness, tremors, focal weakness, seizures, syncope, facial asymmetry, speech difficulty, weakness, light-headedness, numbness and headaches.  Hematological: Negative for adenopathy.  Psychiatric/Behavioral: Negative.        Objective:   Physical Exam  Vitals reviewed. Constitutional: She is oriented to person, place, and time. She appears well-developed and well-nourished. No distress.  HENT:  Head: Normocephalic and atraumatic.  Mouth/Throat: Oropharynx is clear and moist. No oropharyngeal exudate.  Eyes: Conjunctivae are normal. Right eye exhibits no discharge. Left eye exhibits no discharge. No scleral icterus.  Neck: Normal range of motion. Neck supple. No JVD present. No tracheal deviation present. No thyromegaly present.  Cardiovascular: Normal rate, regular rhythm and intact distal pulses.  Exam reveals no gallop and no friction rub.   Murmur heard. Pulmonary/Chest: Effort normal and breath sounds normal. No stridor. No respiratory distress. She has no wheezes. She has no rales. She exhibits no tenderness.  Abdominal: Soft. Bowel sounds are normal. She exhibits no distension and no mass. There is no tenderness. There is no rebound and no guarding.  Musculoskeletal: Normal range of motion. She exhibits  edema (trace edema in both legs). She exhibits no tenderness.  Lymphadenopathy:    She has no cervical adenopathy.  Neurological: She is oriented to person, place, and time.  Skin: Skin is warm and dry. No rash noted.  She is not diaphoretic. No erythema. No pallor.  Psychiatric: She has a normal mood and affect. Her behavior is normal. Judgment and thought content normal.      Lab Results  Component Value Date   WBC 8.5 12/01/2010   HGB 13.3 12/01/2010   HCT 39.2 12/01/2010   PLT 328.0 12/01/2010   GLUCOSE 111* 02/02/2011   CHOL 166 12/01/2010   TRIG 147.0 12/01/2010   HDL 69.90 12/01/2010   LDLCALC 67 12/01/2010   ALT 21 02/02/2011   AST 21 02/02/2011   NA 138 02/02/2011   K 3.8 02/02/2011   CL 101 02/02/2011   CREATININE 0.7 02/02/2011   BUN 19 02/02/2011   CO2 29 02/02/2011   TSH 4.55 02/02/2011   INR 2.7 04/13/2011   HGBA1C 6.4 02/02/2011      Assessment & Plan:

## 2011-05-04 NOTE — Patient Instructions (Signed)
Diabetes, Type 2 Diabetes is a long-lasting (chronic) disease. In type 2 diabetes, the pancreas does not make enough insulin (a hormone), and the body does not respond normally to the insulin that is made. This type of diabetes was also previously called adult-onset diabetes. It usually occurs after the age of 91, but it can occur at any age.  CAUSES  Type 2 diabetes happens because the pancreasis not making enough insulin or your body has trouble using the insulin that your pancreas does make properly. SYMPTOMS   Drinking more than usual.   Urinating more than usual.   Blurred vision.   Dry, itchy skin.   Frequent infections.   Feeling more tired than usual (fatigue).  DIAGNOSIS The diagnosis of type 2 diabetes is usually made by one of the following tests:  Fasting blood glucose test. You will not eat for at least 8 hours and then take a blood test.   Random blood glucose test. Your blood glucose (sugar) is checked at any time of the day regardless of when you ate.   Oral glucose tolerance test (OGTT). Your blood glucose is measured after you have not eaten (fasted) and then after you drink a glucose containing beverage.  TREATMENT   Healthy eating.   Exercise.   Medicine, if needed.   Monitoring blood glucose.   Seeing your caregiver regularly.  HOME CARE INSTRUCTIONS   Check your blood glucose at least once a day. More frequent monitoring may be necessary, depending on your medicines and on how well your diabetes is controlled. Your caregiver will advise you.   Take your medicine as directed by your caregiver.   Do not smoke.   Make wise food choices. Ask your caregiver for information. Weight loss can improve your diabetes.   Learn about low blood glucose (hypoglycemia) and how to treat it.   Get your eyes checked regularly.   Have a yearly physical exam. Have your blood pressure checked and your blood and urine tested.   Wear a pendant or bracelet saying  that you have diabetes.   Check your feet every night for cuts, sores, blisters, and redness. Let your caregiver know if you have any problems.  SEEK MEDICAL CARE IF:   You have problems keeping your blood glucose in target range.   You have problems with your medicines.   You have symptoms of an illness that do not improve after 24 hours.   You have a sore or wound that is not healing.   You notice a change in vision or a new problem with your vision.   You have a fever.  MAKE SURE YOU:  Understand these instructions.   Will watch your condition.   Will get help right away if you are not doing well or get worse.  Document Released: 04/11/2005 Document Revised: 12/23/2010 Document Reviewed: 09/27/2010 [facility] Patient Information 2012 [place], [place].Asthma, Adult Asthma is caused by narrowing of the air passages in the lungs. It may be triggered by pollen, dust, animal dander, molds, some foods, respiratory infections, exposure to smoke, exercise, emotional stress or other allergens (things that cause allergic reactions or allergies). Repeat attacks are common. HOME CARE INSTRUCTIONS   Use prescription medications as ordered by your caregiver.   Avoid pollen, dust, animal dander, molds, smoke and other things that cause attacks at home and at work.   You may have fewer attacks if you decrease dust in your home. Electrostatic air cleaners may help.   It may help  to replace your pillows or mattress with materials less likely to cause allergies.   Talk to your caregiver about an action plan for managing asthma attacks at home, including, the use of a peak flow meter which measures the severity of your asthma attack. An action plan can help minimize or stop the attack without having to seek medical care.   If you are not on a fluid restriction, drink 8 to 10 glasses of water each day.   Always have a plan prepared for seeking medical attention, including, calling your physician,  accessing local emergency care, and calling 911 (in the [place] for a severe attack.   Discuss possible exercise routines with your caregiver.   If animal dander is the cause of asthma, you may need to get rid of pets.  SEEK MEDICAL CARE IF:   You have wheezing and shortness of breath even if taking medicine to prevent attacks.   You have muscle aches, chest pain or thickening of sputum.   Your sputum changes from clear or white to yellow, green, gray, or bloody.   You have any problems that may be related to the medicine you are taking (such as a rash, itching, swelling or trouble breathing).  SEEK IMMEDIATE MEDICAL CARE IF:   Your usual medicines do not stop your wheezing or there is increased coughing and/or shortness of breath.   You have increased difficulty breathing.   You have a fever.  MAKE SURE YOU:   Understand these instructions.   Will watch your condition.   Will get help right away if you are not doing well or get worse.  Document Released: 04/11/2005 Document Revised: 12/22/2010 Document Reviewed: 11/28/2007 [facility] Patient Information 2012 [place], [place].

## 2011-05-04 NOTE — Assessment & Plan Note (Signed)
Her BP is well controlled, I will check her lytes and renal function today 

## 2011-05-05 ENCOUNTER — Encounter: Payer: Self-pay | Admitting: Internal Medicine

## 2011-05-11 ENCOUNTER — Ambulatory Visit (INDEPENDENT_AMBULATORY_CARE_PROVIDER_SITE_OTHER): Payer: Medicare Other | Admitting: *Deleted

## 2011-05-11 DIAGNOSIS — I4891 Unspecified atrial fibrillation: Secondary | ICD-10-CM

## 2011-05-11 NOTE — Progress Notes (Signed)
Addended by: Weston Brass R on: 05/11/2011 11:33 AM   Modules accepted: Orders

## 2011-05-27 ENCOUNTER — Telehealth: Payer: Self-pay | Admitting: Cardiology

## 2011-05-27 NOTE — Telephone Encounter (Signed)
New Problem   Please return call to patient who is experiencing medical issues and would like to come in for ck up before 06/27/11 ROV.  Patient is extremely aggravated, she can be reached on hm#

## 2011-05-27 NOTE — Telephone Encounter (Signed)
SPOKE WITH PT HAS SEVERAL COMPLAINTS  INCREASE IN ASTHMA (SOB),PRODUCTIVE COUGH (YELLOW SPUTUM) , HX OF AFIB FEELS LIKE MAY BE GOING IN AND OUT MORE FREQUENTLY , AND PEDAL EDEMA  OF WHICH  PT STATED DR Yetta Barre  DID NOTE  AND MADE NO CHANGES AT THIS TIME PT WOULD LIKE EARLIER APPT .NOTHING AVAILABLE ANY EARLIER WITH DR CRENSHAW  APPT MADE WITH LORI GERHARDT NP ON MON 05-30-11 AT 9:15 AM. INFORMED PT IF S/S INCREASE OVER WEEKEND TO GO TO ER FOR EVAL AND TX.PT VERBALIZED UNDERSTANDING./CY

## 2011-05-29 NOTE — Telephone Encounter (Signed)
Ok  [name] [name]  

## 2011-05-30 ENCOUNTER — Encounter: Payer: Self-pay | Admitting: Nurse Practitioner

## 2011-05-30 ENCOUNTER — Ambulatory Visit (INDEPENDENT_AMBULATORY_CARE_PROVIDER_SITE_OTHER): Payer: Medicare Other | Admitting: Nurse Practitioner

## 2011-05-30 VITALS — BP 118/70 | HR 73 | Ht 68.0 in | Wt 227.0 lb

## 2011-05-30 DIAGNOSIS — I359 Nonrheumatic aortic valve disorder, unspecified: Secondary | ICD-10-CM

## 2011-05-30 DIAGNOSIS — R609 Edema, unspecified: Secondary | ICD-10-CM

## 2011-05-30 DIAGNOSIS — I10 Essential (primary) hypertension: Secondary | ICD-10-CM

## 2011-05-30 DIAGNOSIS — I4891 Unspecified atrial fibrillation: Secondary | ICD-10-CM

## 2011-05-30 DIAGNOSIS — R0609 Other forms of dyspnea: Secondary | ICD-10-CM | POA: Diagnosis not present

## 2011-05-30 DIAGNOSIS — R0600 Dyspnea, unspecified: Secondary | ICD-10-CM

## 2011-05-30 DIAGNOSIS — R0989 Other specified symptoms and signs involving the circulatory and respiratory systems: Secondary | ICD-10-CM | POA: Diagnosis not present

## 2011-05-30 LAB — BRAIN NATRIURETIC PEPTIDE: Pro B Natriuretic peptide (BNP): 18 pg/mL (ref 0.0–100.0)

## 2011-05-30 NOTE — Assessment & Plan Note (Signed)
Last echo was in 2007. She is now complaining of more arrhythmia and swelling. We will check a BNP today and update her echo. I have left her medicines as they are for now. She is to see Dr. Jens Som next month. Patient is agreeable to this plan and will call if any problems develop in the interim.

## 2011-05-30 NOTE — Assessment & Plan Note (Signed)
Has a history of PAF. She is on coumadin and her INR's have been satisfactory. Currently in sinus rhythm today.

## 2011-05-30 NOTE — Progress Notes (Signed)
[name] [name] Date of Birth: Jul 25, 1940 Medical Record #[id number]  History of Present Illness: Patient is seen today as a work in visit for Dr. Jens Som. She has a history of PAF and is on chronic coumadin therapy. Other problems include obesity,  HTN and hyperlipidemia. She comes in after requesting an earlier appointment due to multiple complaints. She notes that she has had more issues with her asthma. Cough is more productive. Dr. Yetta Barre has just added Singulair to her regimen. She notes that her feet are swelling and that she is having more atrial fibrillation. Her last echo dates back to 2007 and showed mild aortic regurgitation.   She comes in today. She notes some swelling in her left foot over the past month. It will go down overnight. She has used some support stockings. Salt use is questionable. She is on diuretic therapy. She has been eating canned soup. No chest pain. Heart feels "fluttery" at times. Rates in the 60's. Blood pressure has been ok at home. She is more wheezy and says she keeps a chronic sinusitis. She has multiple allergies reported. Not lightheaded or dizzy. Does have dyspnea with exertion that seems to be at her baseline. Does not exercise on a regular basis.   Current Outpatient Prescriptions on File Prior to Visit  Medication Sig Dispense Refill  . albuterol (VENTOLIN HFA) 108 (90 BASE) MCG/ACT inhaler Inhale 2 puffs into the lungs every 6 (six) hours as needed.  1 Inhaler  6  . budesonide-formoterol (SYMBICORT) 160-4.5 MCG/ACT inhaler Inhale 2 puffs into the lungs 2 (two) times daily.  1 Inhaler  11  . calcium-vitamin D (OSCAL WITH D 500-200) 500-200 MG-UNIT per tablet Take 2 tablets by mouth daily.       Marland Kitchen diltiazem (CARDIZEM CD) 240 MG 24 hr capsule Take 1 capsule (240 mg total) by mouth daily.  30 capsule  12  . Glucosamine-Chondroit-Vit C-Mn (GLUCOSAMINE 1500 COMPLEX PO) Take by mouth daily.       Marland Kitchen levothyroxine (SYNTHROID, LEVOTHROID) 25 MCG tablet  Take 1 tablet (25 mcg total) by mouth daily.  90 tablet  3  . loratadine (CLARITIN) 10 MG tablet Take 10 mg by mouth daily.        . montelukast (SINGULAIR) 10 MG tablet Take 1 tablet (10 mg total) by mouth at bedtime.  30 tablet  11  . NEXIUM 40 MG capsule TAKE 1 CAPSULE BY MOUTH TWICE A DAY  60 capsule  10  . triamterene-hydrochlorothiazide (DYAZIDE) 37.5-25 MG per capsule Take 1 capsule by mouth every morning.  30 capsule  12  . warfarin (COUMADIN) 5 MG tablet Take as directed by the Anticoagulation Clinic.  135 tablet  1  . DISCONTD: CRESTOR 10 MG tablet TAKE 1 TABLET EVERY DAY  30 tablet  5    Allergies  Allergen Reactions  . Azithromycin   . Cefaclor   . Cephalexin   . Clindamycin   . Ibuprofen   . Levofloxacin   . Nsaids     Respiratory-Asthma  . Penicillins   . Pravastatin Sodium   . Simvastatin   . Sulfonamide Derivatives   . Tetracycline     Past Medical History  Diagnosis Date  . Migraine, unspecified, without mention of intractable migraine without mention of status migrainosus   . HLD (hyperlipidemia)   . Aortic insufficiency   . Colon polyps   . Hypothyroidism   . GERD (gastroesophageal reflux disease)   . Osteoarthritis   . HTN (hypertension)   .  Depression   . PAF (paroxysmal atrial fibrillation)     on coumadin and Cardiazem  . Asthma   . Allergic rhinitis   . Chronic anticoagulation   . Obesity     Past Surgical History  Procedure Date  . Appendectomy   . Abdominal hysterectomy   . Tubal ligation   . US echocardiography 2007    History  Smoking status  . Never Smoker   Smokeless tobacco  . Not on file    History  Alcohol Use No    Family History  Problem Relation Age of Onset  . Heart disease    . Hypertension    . COPD    . Crohn's disease    . Diabetes    . Depression    . Dementia      Review of Systems: The review of systems is per the HP.  All other systems were reviewed and are negative.  Physical Exam: BP 118/70   Pulse 73  Ht 5\' 8"  (1.727 m)  Wt 227 lb (102.967 kg)  BMI 34.52 kg/m2  SpO2 95%  LMP 12/01/2010 Patient is pleasant and in no acute distress. She is obese. She does have a congested cough. Skin is warm and dry. Color is normal.  HEENT is unremarkable. Normocephalic/atraumatic. PERRL. Sclera are nonicteric. Neck is supple. No masses. No JVD. Lungs show scattered expiratory wheezes. Cardiac exam shows a regular rate and rhythm. No murmur. Heart tones are distant.  Abdomen is obese but soft. Extremities are with trace edema, left greater than right.  Gait and ROM are intact. No gross neurologic deficits noted.   LABORATORY DATA: EKG shows sinus rhythm with PAC's. Tracing was reviewed with Dr. Elease Hashimoto.    Assessment / Plan:

## 2011-05-30 NOTE — Assessment & Plan Note (Signed)
She does have some swelling in her feet. Salt use is questionable. I have encouraged her to keep using the support stockings, elevate her legs and minimize her salt. She is already taking Maxzide. I have left her medicines as they are.

## 2011-05-30 NOTE — Patient Instructions (Addendum)
We are going to get an ultrasound of your heart to look at your heart function.  We are going to check some labs today.  Keep your appointment with Dr. Jens Som for early next month.  Stay on your current medicines  Minimize your salt. Wear your support stockings. Elevate your legs.  Call the [facility] office at [phone] if you have any questions, problems or concerns.

## 2011-05-30 NOTE — Assessment & Plan Note (Signed)
Blood pressure is ok. No change in her current medicines.

## 2011-06-02 ENCOUNTER — Ambulatory Visit (HOSPITAL_COMMUNITY): Payer: Medicare Other | Attending: Cardiovascular Disease

## 2011-06-02 DIAGNOSIS — E785 Hyperlipidemia, unspecified: Secondary | ICD-10-CM | POA: Insufficient documentation

## 2011-06-02 DIAGNOSIS — R0600 Dyspnea, unspecified: Secondary | ICD-10-CM

## 2011-06-02 DIAGNOSIS — R0989 Other specified symptoms and signs involving the circulatory and respiratory systems: Secondary | ICD-10-CM | POA: Insufficient documentation

## 2011-06-02 DIAGNOSIS — R0609 Other forms of dyspnea: Secondary | ICD-10-CM | POA: Diagnosis not present

## 2011-06-02 DIAGNOSIS — R609 Edema, unspecified: Secondary | ICD-10-CM | POA: Diagnosis not present

## 2011-06-02 DIAGNOSIS — I4891 Unspecified atrial fibrillation: Secondary | ICD-10-CM | POA: Diagnosis not present

## 2011-06-02 DIAGNOSIS — I10 Essential (primary) hypertension: Secondary | ICD-10-CM | POA: Insufficient documentation

## 2011-06-02 DIAGNOSIS — I359 Nonrheumatic aortic valve disorder, unspecified: Secondary | ICD-10-CM

## 2011-06-02 DIAGNOSIS — E669 Obesity, unspecified: Secondary | ICD-10-CM | POA: Diagnosis not present

## 2011-06-17 ENCOUNTER — Ambulatory Visit
Admission: RE | Admit: 2011-06-17 | Discharge: 2011-06-17 | Disposition: A | Discharge status: Home / Self Care | Payer: Medicare Other | Source: Ambulatory Visit | Attending: Allergy | Admitting: Allergy

## 2011-06-17 ENCOUNTER — Other Ambulatory Visit: Payer: Self-pay | Admitting: Internal Medicine

## 2011-06-17 ENCOUNTER — Other Ambulatory Visit: Payer: Self-pay | Admitting: Allergy

## 2011-06-17 DIAGNOSIS — J309 Allergic rhinitis, unspecified: Secondary | ICD-10-CM | POA: Diagnosis not present

## 2011-06-17 DIAGNOSIS — J45909 Unspecified asthma, uncomplicated: Secondary | ICD-10-CM

## 2011-06-17 DIAGNOSIS — R05 Cough: Secondary | ICD-10-CM | POA: Diagnosis not present

## 2011-06-17 DIAGNOSIS — R0602 Shortness of breath: Secondary | ICD-10-CM | POA: Diagnosis not present

## 2011-06-17 DIAGNOSIS — R062 Wheezing: Secondary | ICD-10-CM | POA: Diagnosis not present

## 2011-06-20 ENCOUNTER — Encounter: Payer: Self-pay | Admitting: Cardiology

## 2011-06-27 ENCOUNTER — Ambulatory Visit (INDEPENDENT_AMBULATORY_CARE_PROVIDER_SITE_OTHER): Payer: Medicare Other | Admitting: Pharmacist

## 2011-06-27 ENCOUNTER — Ambulatory Visit (INDEPENDENT_AMBULATORY_CARE_PROVIDER_SITE_OTHER): Payer: Medicare Other | Admitting: Cardiology

## 2011-06-27 ENCOUNTER — Encounter: Payer: Self-pay | Admitting: Cardiology

## 2011-06-27 ENCOUNTER — Ambulatory Visit: Payer: Medicare Other | Admitting: Cardiology

## 2011-06-27 DIAGNOSIS — I4891 Unspecified atrial fibrillation: Secondary | ICD-10-CM

## 2011-06-27 DIAGNOSIS — I10 Essential (primary) hypertension: Secondary | ICD-10-CM

## 2011-06-27 DIAGNOSIS — E785 Hyperlipidemia, unspecified: Secondary | ICD-10-CM | POA: Diagnosis not present

## 2011-06-27 DIAGNOSIS — I359 Nonrheumatic aortic valve disorder, unspecified: Secondary | ICD-10-CM | POA: Diagnosis not present

## 2011-06-27 NOTE — Assessment & Plan Note (Signed)
Management per primary care. 

## 2011-06-27 NOTE — Assessment & Plan Note (Signed)
Patient remains in sinus rhythm on examination. Continue Cardizem and Coumadin.

## 2011-06-27 NOTE — Assessment & Plan Note (Signed)
Blood pressure controlled. Continue present medications. 

## 2011-06-27 NOTE — Assessment & Plan Note (Signed)
Mild aortic stenosis/aortic insufficiency on recent echo. Plan followup studies in the future.

## 2011-06-27 NOTE — Progress Notes (Signed)
HPI: [name] [name] is a pleasant female who has a history of paroxysmal atrial fibrillation, hypertension, and hyperlipidemia. She had a catheterization performed by Meade Maw in October 2003. There was no obstructive coronary disease and her ejection fraction was normal. Her last echocardiogram was performed on December 01, 2005, which showed normal LV function. There was no significant valvular abnormalities. Patient recently seen by Norma Fredrickson for edema and palpitations. Repeat echocardiogram in February of 2013 showed an ejection fraction of 55-65%, grade 1 diastolic dysfunction, mild aortic stenosis and regurgitation, mild left atrial enlargement. BNP 18. Also note TSH was normal in January 2013. Since that time, the patient has dyspnea with more extreme activities but not with routine activities. It is relieved with rest. It is not associated with chest pain. There is no orthopnea, PND or pedal edema. There is no syncope. There is no exertional chest pain. Palpitations have improved. She has seen an allergist for a cough.   Current Outpatient Prescriptions  Medication Sig Dispense Refill  . albuterol (PROAIR HFA) 108 (90 BASE) MCG/ACT inhaler Inhale 2 puffs into the lungs every 6 (six) hours as needed.      . calcium-vitamin D (OSCAL WITH D 500-200) 500-200 MG-UNIT per tablet Take 2 tablets by mouth daily.       . cetirizine (ZYRTEC) 10 MG tablet Take 10 mg by mouth daily.      Marland Kitchen diltiazem (CARDIZEM CD) 240 MG 24 hr capsule TAKE 1 CAPSULE BY MOUTH EVERY DAY  30 capsule  9  . Glucosamine-Chondroit-Vit C-Mn (GLUCOSAMINE 1500 COMPLEX PO) Take by mouth daily.       Marland Kitchen levothyroxine (SYNTHROID, LEVOTHROID) 25 MCG tablet Take 1 tablet (25 mcg total) by mouth daily.  90 tablet  3  . mometasone-formoterol (DULERA) 100-5 MCG/ACT AERO Inhale 2 puffs into the lungs as needed.      . montelukast (SINGULAIR) 10 MG tablet Take 1 tablet (10 mg total) by mouth at bedtime.  30 tablet  11  . NEXIUM 40 MG  capsule TAKE 1 CAPSULE BY MOUTH TWICE A DAY  60 capsule  10  . rosuvastatin (CRESTOR) 10 MG tablet Take 5 mg by mouth daily.       Marland Kitchen triamterene-hydrochlorothiazide (DYAZIDE) 37.5-25 MG per capsule Take 1 capsule by mouth every morning.  30 capsule  12  . warfarin (COUMADIN) 5 MG tablet Take as directed by the Anticoagulation Clinic.  135 tablet  1  . DISCONTD: diltiazem (CARDIZEM CD) 240 MG 24 hr capsule Take 1 capsule (240 mg total) by mouth daily.  30 capsule  12     Past Medical History  Diagnosis Date  . Migraine, unspecified, without mention of intractable migraine without mention of status migrainosus   . HLD (hyperlipidemia)   . Aortic insufficiency   . Colon polyps   . Hypothyroidism   . GERD (gastroesophageal reflux disease)   . Osteoarthritis   . HTN (hypertension)   . Depression   . PAF (paroxysmal atrial fibrillation)     on coumadin and Cardiazem  . Asthma   . Allergic rhinitis   . Chronic anticoagulation   . Obesity     Past Surgical History  Procedure Date  . Appendectomy   . Abdominal hysterectomy   . Tubal ligation   . US echocardiography 2007    History   Social History  . Marital Status: Married    Spouse Name: N/A    Number of Children: 3  . Years of Education: N/A  Occupational History  . retired [employer] fundraiser    Social History Main Topics  . Smoking status: Never Smoker   . Smokeless tobacco: Not on file  . Alcohol Use: No  . Drug Use: No  . Sexually Active: Not Currently   Other Topics Concern  . Not on file   Social History Narrative  . No narrative on file    ROS: no fevers or chills, productive cough, hemoptysis, dysphasia, odynophagia, melena, hematochezia, dysuria, hematuria, rash, seizure activity, orthopnea, PND, pedal edema, claudication. Remaining systems are negative.  Physical Exam: Well-developed well-nourished in no acute distress.  Skin is warm and dry.  HEENT is normal.  Neck is supple. No thyromegaly.  Chest is clear to  auscultation with normal expansion.  Cardiovascular exam is regular rate and rhythm. 2/6 systolic murmur left sternal border. No diastolic murmur. S2 is not diminished. Abdominal exam nontender or distended. No masses palpated. Extremities show trace ankle edema. neuro grossly intact

## 2011-06-27 NOTE — Patient Instructions (Signed)
Your physician wants you to follow-up in: 1 year. You will receive a reminder letter in the mail two months in advance. If you don't receive a letter, please call our office to schedule the follow-up appointment.  

## 2011-07-06 LAB — HM COLONOSCOPY: HM Colonoscopy: NORMAL

## 2011-07-23 ENCOUNTER — Other Ambulatory Visit: Payer: Self-pay | Admitting: Cardiology

## 2011-07-25 ENCOUNTER — Ambulatory Visit (INDEPENDENT_AMBULATORY_CARE_PROVIDER_SITE_OTHER): Payer: Medicare Other | Admitting: *Deleted

## 2011-07-25 DIAGNOSIS — I4891 Unspecified atrial fibrillation: Secondary | ICD-10-CM

## 2011-08-03 ENCOUNTER — Other Ambulatory Visit: Payer: Self-pay | Admitting: Cardiology

## 2011-08-15 ENCOUNTER — Ambulatory Visit (INDEPENDENT_AMBULATORY_CARE_PROVIDER_SITE_OTHER): Payer: Medicare Other | Admitting: *Deleted

## 2011-08-15 DIAGNOSIS — I4891 Unspecified atrial fibrillation: Secondary | ICD-10-CM | POA: Diagnosis not present

## 2011-09-01 ENCOUNTER — Ambulatory Visit (INDEPENDENT_AMBULATORY_CARE_PROVIDER_SITE_OTHER): Payer: Medicare Other | Admitting: Internal Medicine

## 2011-09-01 ENCOUNTER — Other Ambulatory Visit (INDEPENDENT_AMBULATORY_CARE_PROVIDER_SITE_OTHER): Payer: Medicare Other

## 2011-09-01 ENCOUNTER — Encounter: Payer: Self-pay | Admitting: Internal Medicine

## 2011-09-01 ENCOUNTER — Other Ambulatory Visit: Payer: Self-pay

## 2011-09-01 VITALS — BP 138/56 | HR 80 | Temp 97.4°F | Resp 16 | Wt 234.0 lb

## 2011-09-01 DIAGNOSIS — I10 Essential (primary) hypertension: Secondary | ICD-10-CM | POA: Diagnosis not present

## 2011-09-01 DIAGNOSIS — E039 Hypothyroidism, unspecified: Secondary | ICD-10-CM | POA: Diagnosis not present

## 2011-09-01 DIAGNOSIS — E785 Hyperlipidemia, unspecified: Secondary | ICD-10-CM

## 2011-09-01 DIAGNOSIS — E876 Hypokalemia: Secondary | ICD-10-CM

## 2011-09-01 DIAGNOSIS — R609 Edema, unspecified: Secondary | ICD-10-CM

## 2011-09-01 DIAGNOSIS — Z1231 Encounter for screening mammogram for malignant neoplasm of breast: Secondary | ICD-10-CM

## 2011-09-01 DIAGNOSIS — I4891 Unspecified atrial fibrillation: Secondary | ICD-10-CM

## 2011-09-01 DIAGNOSIS — I359 Nonrheumatic aortic valve disorder, unspecified: Secondary | ICD-10-CM

## 2011-09-01 DIAGNOSIS — IMO0001 Reserved for inherently not codable concepts without codable children: Secondary | ICD-10-CM

## 2011-09-01 LAB — CBC WITH DIFFERENTIAL/PLATELET
Basophils Absolute: 0 10*3/uL (ref 0.0–0.1)
Eosinophils Absolute: 0.4 10*3/uL (ref 0.0–0.7)
HCT: 37.3 % (ref 36.0–46.0)
Hemoglobin: 12.5 g/dL (ref 12.0–15.0)
Lymphs Abs: 2 10*3/uL (ref 0.7–4.0)
MCHC: 33.5 g/dL (ref 30.0–36.0)
MCV: 91.4 fl (ref 78.0–100.0)
Monocytes Absolute: 0.6 10*3/uL (ref 0.1–1.0)
Neutro Abs: 6.2 10*3/uL (ref 1.4–7.7)
Platelets: 338 10*3/uL (ref 150.0–400.0)
RDW: 15.6 % — ABNORMAL HIGH (ref 11.5–14.6)

## 2011-09-01 LAB — COMPREHENSIVE METABOLIC PANEL
ALT: 20 U/L (ref 0–35)
AST: 25 U/L (ref 0–37)
Albumin: 3.6 g/dL (ref 3.5–5.2)
CO2: 27 mEq/L (ref 19–32)
Calcium: 9 mg/dL (ref 8.4–10.5)
Chloride: 102 mEq/L (ref 96–112)
Creatinine, Ser: 0.8 mg/dL (ref 0.4–1.2)
GFR: 70.64 mL/min (ref >60.00)
Potassium: 3.2 mEq/L — ABNORMAL LOW (ref 3.5–5.1)

## 2011-09-01 LAB — TSH: TSH: 3.95 u[IU]/mL (ref 0.35–5.50)

## 2011-09-01 MED ORDER — OLMESARTAN MEDOXOMIL 40 MG PO TABS: 40.0000 mg | ORAL_TABLET | Freq: Every day | ORAL | Status: DC

## 2011-09-01 MED ORDER — ESOMEPRAZOLE MAGNESIUM 40 MG PO CPDR: DELAYED_RELEASE_CAPSULE | ORAL | Status: DC

## 2011-09-01 MED ORDER — FUROSEMIDE 40 MG PO TABS: 40.0000 mg | ORAL_TABLET | Freq: Two times a day (BID) | ORAL | Status: DC

## 2011-09-01 MED ORDER — POTASSIUM CHLORIDE CRYS ER 20 MEQ PO TBCR: 20.0000 meq | EXTENDED_RELEASE_TABLET | Freq: Two times a day (BID) | ORAL | Status: DC

## 2011-09-01 NOTE — Patient Instructions (Signed)
Edema Edema is an abnormal build-up of fluids in tissues. Because this is partly dependent on gravity (water flows to the lowest place), it is more common in the leg sand thighs (lower extremities). It is also common in the looser tissues, like around the eyes. Painless swelling of the feet and ankles is common and increases as a person ages. It may affect both legs and may include the calves or even thighs. When squeezed, the fluid may move out of the affected area and may leave a dent for a few moments. CAUSES   Prolonged standing or sitting in one place for extended periods of time. Movement helps pump tissue fluid into the veins, and absence of movement prevents this, resulting in edema.   Varicose veins. The valves in the veins do not work as well as they should. This causes fluid to leak into the tissues.   Fluid and salt overload.   Injury, burn, or surgery to the leg, ankle, or foot, may damage veins and allow fluid to leak out.   Sunburn damages vessels. Leaky vessels allow fluid to go out into the sunburned tissues.   Allergies (from insect bites or stings, medications or chemicals) cause swelling by allowing vessels to become leaky.   Protein in the blood helps keep fluid in your vessels. Low protein, as in malnutrition, allows fluid to leak out.   Hormonal changes, including pregnancy and menstruation, cause fluid retention. This fluid may leak out of vessels and cause edema.   Medications that cause fluid retention. Examples are sex hormones, blood pressure medications, steroid treatment, or anti-depressants.   Some illnesses cause edema, especially heart failure, kidney disease, or liver disease.   Surgery that cuts veins or lymph nodes, such as surgery done for the heart or for breast cancer, may result in edema.  DIAGNOSIS  Your caregiver is usually easily able to determine what is causing your swelling (edema) by simply asking what is wrong (getting a history) and examining  you (doing a physical). Sometimes x-rays, EKG (electrocardiogram or heart tracing), and blood work may be done to evaluate for underlying medical illness. TREATMENT  General treatment includes:  Leg elevation (or elevation of the affected body part).   Restriction of fluid intake.   Prevention of fluid overload.   Compression of the affected body part. Compression with elastic bandages or support stockings squeezes the tissues, preventing fluid from entering and forcing it back into the blood vessels.   Diuretics (also called water pills or fluid pills) pull fluid out of your body in the form of increased urination. These are effective in reducing the swelling, but can have side effects and must be used only under your caregiver's supervision. Diuretics are appropriate only for some types of edema.  The specific treatment can be directed at any underlying causes discovered. Heart, liver, or kidney disease should be treated appropriately. HOME CARE INSTRUCTIONS   Elevate the legs (or affected body part) above the level of the heart, while lying down.   Avoid sitting or standing still for prolonged periods of time.   Avoid putting anything directly under the knees when lying down, and do not wear constricting clothing or garters on the upper legs.   Exercising the legs causes the fluid to work back into the veins and lymphatic channels. This may help the swelling go down.   The pressure applied by elastic bandages or support stockings can help reduce ankle swelling.   A low-salt diet may help reduce fluid  retention and decrease the ankle swelling.   Take any medications exactly as prescribed.  SEEK MEDICAL CARE IF:  Your edema is not responding to recommended treatments. SEEK IMMEDIATE MEDICAL CARE IF:   You develop shortness of breath or chest pain.   You cannot breathe when you lay down; or if, while lying down, you have to get up and go to the window to get your breath.   You  are having increasing swelling without relief from treatment.   You develop a fever over 102 F (38.9 C).   You develop pain or redness in the areas that are swollen.   Tell your caregiver right away if you have gained 3 lb/1.4 kg in 1 day or 5 lb/2.3 kg in a week.  MAKE SURE YOU:   Understand these instructions.   Will watch your condition.   Will get help right away if you are not doing well or get worse.  Document Released: 04/11/2005 Document Revised: 03/31/2011 Document Reviewed: 11/28/2007 [facility] Patient Information 2012 [place], [place].Diabetes, Type 2 Diabetes is a long-lasting (chronic) disease. In type 2 diabetes, the pancreas does not make enough insulin (a hormone), and the body does not respond normally to the insulin that is made. This type of diabetes was also previously called adult-onset diabetes. It usually occurs after the age of 46, but it can occur at any age.  CAUSES  Type 2 diabetes happens because the pancreasis not making enough insulin or your body has trouble using the insulin that your pancreas does make properly. SYMPTOMS   Drinking more than usual.   Urinating more than usual.   Blurred vision.   Dry, itchy skin.   Frequent infections.   Feeling more tired than usual (fatigue).  DIAGNOSIS The diagnosis of type 2 diabetes is usually made by one of the following tests:  Fasting blood glucose test. You will not eat for at least 8 hours and then take a blood test.   Random blood glucose test. Your blood glucose (sugar) is checked at any time of the day regardless of when you ate.   Oral glucose tolerance test (OGTT). Your blood glucose is measured after you have not eaten (fasted) and then after you drink a glucose containing beverage.  TREATMENT   Healthy eating.   Exercise.   Medicine, if needed.   Monitoring blood glucose.   Seeing your caregiver regularly.  HOME CARE INSTRUCTIONS   Check your blood glucose at least once a day.  More frequent monitoring may be necessary, depending on your medicines and on how well your diabetes is controlled. Your caregiver will advise you.   Take your medicine as directed by your caregiver.   Do not smoke.   Make wise food choices. Ask your caregiver for information. Weight loss can improve your diabetes.   Learn about low blood glucose (hypoglycemia) and how to treat it.   Get your eyes checked regularly.   Have a yearly physical exam. Have your blood pressure checked and your blood and urine tested.   Wear a pendant or bracelet saying that you have diabetes.   Check your feet every night for cuts, sores, blisters, and redness. Let your caregiver know if you have any problems.  SEEK MEDICAL CARE IF:   You have problems keeping your blood glucose in target range.   You have problems with your medicines.   You have symptoms of an illness that do not improve after 24 hours.   You have a sore  or wound that is not healing.   You notice a change in vision or a new problem with your vision.   You have a fever.  MAKE SURE YOU:  Understand these instructions.   Will watch your condition.   Will get help right away if you are not doing well or get worse.  Document Released: 04/11/2005 Document Revised: 03/31/2011 Document Reviewed: 09/27/2010 [facility] Patient Information 2012 [place], [place].

## 2011-09-01 NOTE — Assessment & Plan Note (Signed)
She is doing well on crestor, I will check her CK today

## 2011-09-01 NOTE — Progress Notes (Signed)
Subjective:    Patient ID: [name] [name], female    DOB: 06/22/1940, 71 y.o.   MRN: [id number]  Hypertension This is a chronic problem. The current episode started more than 1 year ago. The problem is unchanged. The problem is controlled. Associated symptoms include malaise/fatigue and peripheral edema. Pertinent negatives include no anxiety, blurred vision, chest pain, headaches, neck pain, orthopnea, palpitations, PND, shortness of breath or sweats. Past treatments include calcium channel blockers and diuretics. The current treatment provides moderate improvement. Compliance problems include exercise, diet and medication side effects.  Hypertensive end-organ damage includes a thyroid problem. Identifiable causes of hypertension include sleep apnea.      Review of Systems  Constitutional: Positive for malaise/fatigue, activity change, fatigue and unexpected weight change (weight gain). Negative for fever, chills, diaphoresis and appetite change.  HENT: Negative.  Negative for neck pain.   Eyes: Negative.  Negative for blurred vision.  Respiratory: Negative for apnea, cough, choking, chest tightness, shortness of breath, wheezing and stridor.   Cardiovascular: Positive for leg swelling. Negative for chest pain, palpitations, orthopnea and PND.  Gastrointestinal: Negative for nausea, vomiting, abdominal pain, diarrhea, constipation, abdominal distention and anal bleeding.  Genitourinary: Positive for urgency. Negative for dysuria, frequency, flank pain, decreased urine volume, enuresis, difficulty urinating and dyspareunia.  Musculoskeletal: Positive for arthralgias (both knees). Negative for myalgias, back pain, joint swelling and gait problem.  Skin: Negative.   Neurological: Negative for dizziness, tremors, seizures, syncope, facial asymmetry, speech difficulty, weakness, light-headedness, numbness and headaches.  Hematological: Negative for adenopathy. Does not bruise/bleed easily.    Psychiatric/Behavioral: Negative.        Objective:   Physical Exam  Vitals reviewed. Constitutional: She is oriented to person, place, and time. She appears well-developed and well-nourished. No distress.  HENT:  Head: Normocephalic and atraumatic.  Mouth/Throat: Oropharynx is clear and moist. No oropharyngeal exudate.  Eyes: Conjunctivae are normal. Right eye exhibits no discharge. Left eye exhibits no discharge. No scleral icterus.  Neck: Normal range of motion. Neck supple. No JVD present. No tracheal deviation present. No thyromegaly present.  Cardiovascular: Normal rate, regular rhythm, S1 normal and S2 normal.  PMI is not displaced.  Exam reveals no gallop.   Murmur heard.  Decrescendo diastolic murmur is present with a grade of 1/6  Pulses:      Carotid pulses are 1+ on the right side, and 1+ on the left side.      Radial pulses are 1+ on the right side, and 1+ on the left side.       Femoral pulses are 1+ on the right side, and 1+ on the left side.      Popliteal pulses are 1+ on the right side, and 1+ on the left side.       Dorsalis pedis pulses are 1+ on the right side, and 1+ on the left side.       Posterior tibial pulses are 1+ on the right side, and 1+ on the left side.  Pulmonary/Chest: Effort normal and breath sounds normal. No stridor. No respiratory distress. She has no wheezes. She has no rales. She exhibits no tenderness.  Abdominal: Soft. Bowel sounds are normal. She exhibits no distension and no mass. There is no tenderness. There is no rebound and no guarding.  Musculoskeletal: Normal range of motion. She exhibits edema (3+ edema in BLE). She exhibits no tenderness.  Lymphadenopathy:    She has no cervical adenopathy.  Neurological: She is oriented to person, place, and  time.  Skin: Skin is warm and dry. No rash noted. She is not diaphoretic. No erythema. No pallor.  Psychiatric: She has a normal mood and affect. Her behavior is normal. Judgment and thought  content normal.      Lab Results  Component Value Date   WBC 8.3 05/04/2011   HGB 12.9 05/04/2011   HCT 38.0 05/04/2011   PLT 311.0 05/04/2011   GLUCOSE 99 05/04/2011   CHOL 166 12/01/2010   TRIG 147.0 12/01/2010   HDL 69.90 12/01/2010   LDLCALC 67 12/01/2010   ALT 24 05/04/2011   AST 26 05/04/2011   NA 141 05/04/2011   K 3.6 05/04/2011   CL 104 05/04/2011   CREATININE 0.7 05/04/2011   BUN 17 05/04/2011   CO2 29 05/04/2011   TSH 4.76 05/04/2011   INR 2.1 08/15/2011   HGBA1C 6.2 05/04/2011      Assessment & Plan:

## 2011-09-01 NOTE — Assessment & Plan Note (Signed)
I will check her TSH today 

## 2011-09-01 NOTE — Assessment & Plan Note (Addendum)
She will stop the CCB, I will change her diuretic to lasix for fluid mobilization, I tried to order a BNP to assess for CHF but the software refused the request, I will check for other causes of edema, she will see cardiology

## 2011-09-01 NOTE — Assessment & Plan Note (Signed)
I have asked her to have a card f/up about this

## 2011-09-01 NOTE — Assessment & Plan Note (Signed)
Due to edema she has been asked to stop the CCB, I have asked her to start Benicar

## 2011-09-01 NOTE — Assessment & Plan Note (Signed)
She needs to start a K+ replacement

## 2011-09-01 NOTE — Assessment & Plan Note (Signed)
I will check her a1c and her renal function 

## 2011-09-01 NOTE — Assessment & Plan Note (Signed)
I have asked her to have a f/up with cardiology

## 2011-09-07 ENCOUNTER — Ambulatory Visit (INDEPENDENT_AMBULATORY_CARE_PROVIDER_SITE_OTHER): Payer: Medicare Other | Admitting: Nurse Practitioner

## 2011-09-07 ENCOUNTER — Encounter: Payer: Self-pay | Admitting: Nurse Practitioner

## 2011-09-07 VITALS — BP 120/70 | HR 82 | Ht 68.0 in | Wt 231.0 lb

## 2011-09-07 DIAGNOSIS — R609 Edema, unspecified: Secondary | ICD-10-CM

## 2011-09-07 DIAGNOSIS — I10 Essential (primary) hypertension: Secondary | ICD-10-CM | POA: Diagnosis not present

## 2011-09-07 DIAGNOSIS — I519 Heart disease, unspecified: Secondary | ICD-10-CM

## 2011-09-07 DIAGNOSIS — E876 Hypokalemia: Secondary | ICD-10-CM | POA: Diagnosis not present

## 2011-09-07 DIAGNOSIS — I4891 Unspecified atrial fibrillation: Secondary | ICD-10-CM

## 2011-09-07 DIAGNOSIS — I5189 Other ill-defined heart diseases: Secondary | ICD-10-CM

## 2011-09-07 LAB — BASIC METABOLIC PANEL
BUN: 16 mg/dL (ref 6–23)
CO2: 28 mEq/L (ref 19–32)
Calcium: 9 mg/dL (ref 8.4–10.5)
Chloride: 101 mEq/L (ref 96–112)
Creatinine, Ser: 0.7 mg/dL (ref 0.4–1.2)
GFR: 85.77 mL/min (ref >60.00)
Glucose, Bld: 108 mg/dL — ABNORMAL HIGH (ref 70–99)
Potassium: 3.6 mEq/L (ref 3.5–5.1)
Sodium: 139 mEq/L (ref 135–145)

## 2011-09-07 MED ORDER — FUROSEMIDE 40 MG PO TABS: 40.0000 mg | ORAL_TABLET | Freq: Every day | ORAL | Status: DC

## 2011-09-07 NOTE — Patient Instructions (Addendum)
Cut the Lasix back to just one a day  We are going to recheck some lab today  Keep wearing the support stockings; elevate your legs and minimize your salt  Get back to the pool.  I want to see you in 3 months.  Call the [facility] office at [phone] if you have any questions, problems or concerns.

## 2011-09-07 NOTE — Progress Notes (Signed)
[name] [name] Date of Birth: [birth year] Medical Record #[id number]  History of Present Illness: [name] [name] is seen back today for a work in visit. She is seen for Dr. Jens Som. She has been referred back here from her PCP. She has PAF, HTN, HLD, obesity and diastolic dysfunction. She remains on chronic coumadin. She was seen back in February for edema and palpitations.  She had an echo at that time in February of 2013 which showed an EF of 55 to 65% with grade 1 diastolic dysfunction, mild AS and regurgitation, mild LAD. She had a follow up visit with Dr. Jens Som in March and was felt to be stable. She has no known CAD. She has had remote cath by Dr. Fraser Din back in 2003 showing no obstructive disease. Last week she was at her primary care's office. She continued to complain of edema. Her CCB was stopped. Benicar was added. She comes here for follow up.  She is seen today. She is here alone. She is now on Lasix. CCB has been switched to Benicar. She is wearing support stockings. Salt use is questionable. She is not exercising due to a painful knee. She used to go to the pool. No chest pain. She says her rhythm has been ok. She admits to feeling tense and anxious. She doesn't like taking all the Lasix. Potassium was low last week and she is now on replacement. This will need to be rechecked. Her BNP was 18.   Current Outpatient Prescriptions on File Prior to Visit  Medication Sig Dispense Refill  . albuterol (PROAIR HFA) 108 (90 BASE) MCG/ACT inhaler Inhale 2 puffs into the lungs every 6 (six) hours as needed.      . calcium-vitamin D (OSCAL WITH D 500-200) 500-200 MG-UNIT per tablet Take 2 tablets by mouth daily.       Marland Kitchen esomeprazole (NEXIUM) 40 MG capsule TAKE 1 CAPSULE BY MOUTH TWICE A DAY  60 capsule  11  . furosemide (LASIX) 40 MG tablet Take 1 tablet (40 mg total) by mouth 2 (two) times daily.  60 tablet  3  . Glucosamine-Chondroit-Vit C-Mn (GLUCOSAMINE 1500 COMPLEX PO) Take by mouth  daily.       Marland Kitchen levothyroxine (SYNTHROID, LEVOTHROID) 25 MCG tablet Take 1 tablet (25 mcg total) by mouth daily.  90 tablet  3  . Loratadine (CLARITIN PO) Take by mouth.      . mometasone-formoterol (DULERA) 100-5 MCG/ACT AERO Inhale 2 puffs into the lungs as needed.      . montelukast (SINGULAIR) 10 MG tablet Take 1 tablet (10 mg total) by mouth at bedtime.  30 tablet  11  . olmesartan (BENICAR) 40 MG tablet Take 1 tablet (40 mg total) by mouth daily.  56 tablet  0  . potassium chloride SA (K-DUR,KLOR-CON) 20 MEQ tablet Take 1 tablet (20 mEq total) by mouth 2 (two) times daily.  60 tablet  3  . rosuvastatin (CRESTOR) 10 MG tablet Take 5 mg by mouth daily.       Marland Kitchen warfarin (COUMADIN) 5 MG tablet TAKE AS DIRECTED BY THE ANTICOAGULATION CLINIC.  135 tablet  1    Allergies  Allergen Reactions  . Cardizem (Diltiazem Hcl)     edema  . Acetaminophen   . Azithromycin   . Cefaclor   . Cephalexin   . Clindamycin   . Ibuprofen   . Levofloxacin   . Nsaids     Respiratory-Asthma  . Penicillins   . Pravastatin Sodium   .  Simvastatin   . Sulfonamide Derivatives   . Tetracycline     Past Medical History  Diagnosis Date  . Migraine, unspecified, without mention of intractable migraine without mention of status migrainosus   . HLD (hyperlipidemia)   . Aortic insufficiency   . Colon polyps   . Hypothyroidism   . GERD (gastroesophageal reflux disease)   . Osteoarthritis   . HTN (hypertension)   . Depression   . PAF (paroxysmal atrial fibrillation)     on coumadin and Cardiazem  . Asthma   . Allergic rhinitis   . Chronic anticoagulation   . Obesity     Past Surgical History  Procedure Date  . Appendectomy   . Abdominal hysterectomy   . Tubal ligation   . US echocardiography 2007    History  Smoking status  . Never Smoker   Smokeless tobacco  . Not on file    History  Alcohol Use No    Family History  Problem Relation Age of Onset  . Heart disease    . Hypertension     . COPD    . Crohn's disease    . Diabetes    . Depression    . Dementia      Review of Systems: The review of systems is per the HPI.  All other systems were reviewed and are negative.  Physical Exam: BP 120/70  Pulse 82  Ht 5\' 8"  (1.727 m)  Wt 231 lb (104.781 kg)  BMI 35.12 kg/m2  LMP 12/01/2010 Patient is very pleasant and in no acute distress. She is obese. Her weight is up another 10 pounds since her visit in March. Skin is warm and dry. Color is normal.  HEENT is unremarkable. Normocephalic/atraumatic. PERRL. Sclera are nonicteric. Neck is supple. No masses. No JVD. Lungs are clear. Cardiac exam shows a regular rate and rhythm. Abdomen is soft. Extremities are with 1+ edema. Gait and ROM are intact. No gross neurologic deficits noted.   LABORATORY DATA: Repeat BMET is pending.   Lab Results  Component Value Date   WBC 9.3 09/01/2011   HGB 12.5 09/01/2011   HCT 37.3 09/01/2011   PLT 338.0 09/01/2011   GLUCOSE 133* 09/01/2011   CHOL 166 12/01/2010   TRIG 147.0 12/01/2010   HDL 69.90 12/01/2010   LDLCALC 67 12/01/2010   ALT 20 09/01/2011   AST 25 09/01/2011   NA 139 09/01/2011   K 3.2* 09/01/2011   CL 102 09/01/2011   CREATININE 0.8 09/01/2011   BUN 13 09/01/2011   CO2 27 09/01/2011   TSH 3.95 09/01/2011   INR 2.1 08/15/2011   HGBA1C 6.2 09/01/2011     Assessment / Plan:

## 2011-09-07 NOTE — Assessment & Plan Note (Signed)
She remains in sinus by exam. She is on coumadin as well for stroke prevention.

## 2011-09-07 NOTE — Assessment & Plan Note (Signed)
Patient has grade 1 diastolic dysfunction. She remains obese as well. Salt use is questionable. She is now on Lasix for her swelling. She is not short of breath. Her last BNP was only 18. She has normal LV function. I have tried to emphasize the need for weight loss and exercise. A water program would be beneficial for her. We have cut the Lasix back to just one a day. We are rechecking labs today. She is to minimize her salt, continue with support stockings and elevation of her legs. Would like to see her back in 3 months. Weight loss will be crucial for her long term prognosis. Patient is agreeable to this plan and will call if any problems develop in the interim.

## 2011-09-07 NOTE — Assessment & Plan Note (Signed)
Rechecking labs today.

## 2011-09-08 ENCOUNTER — Ambulatory Visit: Payer: Medicare Other | Admitting: Cardiology

## 2011-09-12 ENCOUNTER — Ambulatory Visit (INDEPENDENT_AMBULATORY_CARE_PROVIDER_SITE_OTHER): Payer: Medicare Other | Admitting: Pharmacist

## 2011-09-12 DIAGNOSIS — I4891 Unspecified atrial fibrillation: Secondary | ICD-10-CM

## 2011-09-23 ENCOUNTER — Ambulatory Visit (INDEPENDENT_AMBULATORY_CARE_PROVIDER_SITE_OTHER): Payer: Medicare Other | Admitting: Internal Medicine

## 2011-09-23 ENCOUNTER — Encounter: Payer: Self-pay | Admitting: Internal Medicine

## 2011-09-23 ENCOUNTER — Other Ambulatory Visit (INDEPENDENT_AMBULATORY_CARE_PROVIDER_SITE_OTHER): Payer: Medicare Other

## 2011-09-23 VITALS — BP 128/70 | HR 88 | Temp 98.1°F | Resp 16 | Wt 224.0 lb

## 2011-09-23 DIAGNOSIS — I10 Essential (primary) hypertension: Secondary | ICD-10-CM

## 2011-09-23 DIAGNOSIS — E876 Hypokalemia: Secondary | ICD-10-CM | POA: Diagnosis not present

## 2011-09-23 DIAGNOSIS — I519 Heart disease, unspecified: Secondary | ICD-10-CM

## 2011-09-23 DIAGNOSIS — I4891 Unspecified atrial fibrillation: Secondary | ICD-10-CM | POA: Diagnosis not present

## 2011-09-23 DIAGNOSIS — I872 Venous insufficiency (chronic) (peripheral): Secondary | ICD-10-CM

## 2011-09-23 DIAGNOSIS — I8310 Varicose veins of unspecified lower extremity with inflammation: Secondary | ICD-10-CM | POA: Diagnosis not present

## 2011-09-23 DIAGNOSIS — E039 Hypothyroidism, unspecified: Secondary | ICD-10-CM | POA: Diagnosis not present

## 2011-09-23 DIAGNOSIS — I5189 Other ill-defined heart diseases: Secondary | ICD-10-CM

## 2011-09-23 DIAGNOSIS — R609 Edema, unspecified: Secondary | ICD-10-CM

## 2011-09-23 DIAGNOSIS — IMO0001 Reserved for inherently not codable concepts without codable children: Secondary | ICD-10-CM

## 2011-09-23 LAB — BASIC METABOLIC PANEL
BUN: 15 mg/dL (ref 6–23)
CO2: 29 mEq/L (ref 19–32)
Calcium: 9 mg/dL (ref 8.4–10.5)
Creatinine, Ser: 0.8 mg/dL (ref 0.4–1.2)
GFR: 79.28 mL/min (ref >60.00)
Glucose, Bld: 109 mg/dL — ABNORMAL HIGH (ref 70–99)
Sodium: 140 mEq/L (ref 135–145)

## 2011-09-23 NOTE — Assessment & Plan Note (Signed)
Her recent TSH was normal 

## 2011-09-23 NOTE — Assessment & Plan Note (Signed)
Unchanged to maybe worse, she is not compliant

## 2011-09-23 NOTE — Assessment & Plan Note (Signed)
Her BP is well controlled 

## 2011-09-23 NOTE — Assessment & Plan Note (Signed)
She still has signs of excess fluid

## 2011-09-23 NOTE — Assessment & Plan Note (Signed)
I will recheck her K+ level today 

## 2011-09-23 NOTE — Assessment & Plan Note (Signed)
She will start diabetic education

## 2011-09-23 NOTE — Assessment & Plan Note (Signed)
She was advised on measures to reduce the edema, rash, and itching, she will try benadryl po and cortaid cream as needed for rash and itching

## 2011-09-23 NOTE — Progress Notes (Signed)
  Subjective:    Patient ID: [name] [name], female    DOB: March 03, 1941, 71 y.o.   MRN: [id number]  HPI She returns c/o rash and itching on both legs, her edema is not much better. She is only taking lasix once a day because she saw someone in cardiology and they agreed that twice a day "was just too much for her."   Review of Systems  Constitutional: Negative for fever, chills, diaphoresis, activity change, appetite change, fatigue and unexpected weight change.  HENT: Negative.   Eyes: Negative.   Respiratory: Negative for apnea, cough, chest tightness, shortness of breath, wheezing and stridor.   Cardiovascular: Positive for leg swelling. Negative for chest pain and palpitations.  Gastrointestinal: Negative for abdominal pain, diarrhea, constipation, blood in stool and abdominal distention.  Genitourinary: Negative.   Musculoskeletal: Negative for myalgias, back pain, joint swelling, arthralgias and gait problem.  Skin: Positive for rash. Negative for color change, pallor and wound.  Neurological: Negative.  Negative for dizziness, tremors, seizures, syncope, facial asymmetry, speech difficulty, weakness, light-headedness, numbness and headaches.  Hematological: Negative for adenopathy. Does not bruise/bleed easily.  Psychiatric/Behavioral: Negative.        Objective:   Physical Exam  Vitals reviewed. Constitutional: She is oriented to person, place, and time. She appears well-developed and well-nourished. No distress.  HENT:  Head: Normocephalic and atraumatic.  Mouth/Throat: Oropharynx is clear and moist. No oropharyngeal exudate.  Eyes: Conjunctivae are normal. Right eye exhibits no discharge. Left eye exhibits no discharge. No scleral icterus.  Neck: Normal range of motion. Neck supple. No JVD present. No tracheal deviation present. No thyromegaly present.  Cardiovascular: Normal rate, regular rhythm, normal heart sounds and intact distal pulses.  Exam reveals no gallop  and no friction rub.   No murmur heard. Pulmonary/Chest: Effort normal and breath sounds normal. No stridor. No respiratory distress. She has no wheezes. She has no rales. She exhibits no tenderness.  Abdominal: Soft. Bowel sounds are normal. She exhibits no distension and no mass. There is no tenderness. There is no rebound and no guarding.  Musculoskeletal: Normal range of motion. She exhibits edema (3+ pitting edema in BLE). She exhibits no tenderness.  Lymphadenopathy:    She has no cervical adenopathy.  Neurological: She is oriented to person, place, and time.  Skin: Skin is warm and dry. Rash (over BLE she has scattered areas of papular erythema and scale) noted. She is not diaphoretic. No erythema. No pallor.  Psychiatric: She has a normal mood and affect. Her behavior is normal. Judgment and thought content normal.      Lab Results  Component Value Date   WBC 9.3 09/01/2011   HGB 12.5 09/01/2011   HCT 37.3 09/01/2011   PLT 338.0 09/01/2011   GLUCOSE 108* 09/07/2011   CHOL 166 12/01/2010   TRIG 147.0 12/01/2010   HDL 69.90 12/01/2010   LDLCALC 67 12/01/2010   ALT 20 09/01/2011   AST 25 09/01/2011   NA 139 09/07/2011   K 3.6 09/07/2011   CL 101 09/07/2011   CREATININE 0.7 09/07/2011   BUN 16 09/07/2011   CO2 28 09/07/2011   TSH 3.95 09/01/2011   INR 2.3 09/12/2011   HGBA1C 6.2 09/01/2011      Assessment & Plan:

## 2011-09-23 NOTE — Patient Instructions (Signed)
Edema Edema is an abnormal build-up of fluids in tissues. Because this is partly dependent on gravity (water flows to the lowest place), it is more common in the leg sand thighs (lower extremities). It is also common in the looser tissues, like around the eyes. Painless swelling of the feet and ankles is common and increases as a person ages. It may affect both legs and may include the calves or even thighs. When squeezed, the fluid may move out of the affected area and may leave a dent for a few moments. CAUSES   Prolonged standing or sitting in one place for extended periods of time. Movement helps pump tissue fluid into the veins, and absence of movement prevents this, resulting in edema.   Varicose veins. The valves in the veins do not work as well as they should. This causes fluid to leak into the tissues.   Fluid and salt overload.   Injury, burn, or surgery to the leg, ankle, or foot, may damage veins and allow fluid to leak out.   Sunburn damages vessels. Leaky vessels allow fluid to go out into the sunburned tissues.   Allergies (from insect bites or stings, medications or chemicals) cause swelling by allowing vessels to become leaky.   Protein in the blood helps keep fluid in your vessels. Low protein, as in malnutrition, allows fluid to leak out.   Hormonal changes, including pregnancy and menstruation, cause fluid retention. This fluid may leak out of vessels and cause edema.   Medications that cause fluid retention. Examples are sex hormones, blood pressure medications, steroid treatment, or anti-depressants.   Some illnesses cause edema, especially heart failure, kidney disease, or liver disease.   Surgery that cuts veins or lymph nodes, such as surgery done for the heart or for breast cancer, may result in edema.  DIAGNOSIS  Your caregiver is usually easily able to determine what is causing your swelling (edema) by simply asking what is wrong (getting a history) and examining  you (doing a physical). Sometimes x-rays, EKG (electrocardiogram or heart tracing), and blood work may be done to evaluate for underlying medical illness. TREATMENT  General treatment includes:  Leg elevation (or elevation of the affected body part).   Restriction of fluid intake.   Prevention of fluid overload.   Compression of the affected body part. Compression with elastic bandages or support stockings squeezes the tissues, preventing fluid from entering and forcing it back into the blood vessels.   Diuretics (also called water pills or fluid pills) pull fluid out of your body in the form of increased urination. These are effective in reducing the swelling, but can have side effects and must be used only under your caregiver's supervision. Diuretics are appropriate only for some types of edema.  The specific treatment can be directed at any underlying causes discovered. Heart, liver, or kidney disease should be treated appropriately. HOME CARE INSTRUCTIONS   Elevate the legs (or affected body part) above the level of the heart, while lying down.   Avoid sitting or standing still for prolonged periods of time.   Avoid putting anything directly under the knees when lying down, and do not wear constricting clothing or garters on the upper legs.   Exercising the legs causes the fluid to work back into the veins and lymphatic channels. This may help the swelling go down.   The pressure applied by elastic bandages or support stockings can help reduce ankle swelling.   A low-salt diet may help reduce fluid  retention and decrease the ankle swelling.   Take any medications exactly as prescribed.  SEEK MEDICAL CARE IF:  Your edema is not responding to recommended treatments. SEEK IMMEDIATE MEDICAL CARE IF:   You develop shortness of breath or chest pain.   You cannot breathe when you lay down; or if, while lying down, you have to get up and go to the window to get your breath.   You  are having increasing swelling without relief from treatment.   You develop a fever over 102 F (38.9 C).   You develop pain or redness in the areas that are swollen.   Tell your caregiver right away if you have gained 3 lb/1.4 kg in 1 day or 5 lb/2.3 kg in a week.  MAKE SURE YOU:   Understand these instructions.   Will watch your condition.   Will get help right away if you are not doing well or get worse.  Document Released: 04/11/2005 Document Revised: 03/31/2011 Document Reviewed: 11/28/2007 [facility] Patient Information 2012 [place], [place].Stasis Dermatitis Stasis dermatitis occurs when veins lose the ability to pump blood back to the heart (poor venous circulation). It causes a reddish-purple to brownish scaly, itchy rash on the legs. The rash comes from pooling of blood (stasis). CAUSES  This occurs because the veins do not work very well anymore or because pressure may be increased in the veins due to other conditions. With blood pooling, the increased pressure in the tiny blood vessels (capillaries) causes fluid to leak out of the capillaries into the tissue. The extra fluid makes it harder for the blood to feed the cells and get rid of waste products. SYMPTOMS  Stasis dermatitis appears as red, scaly, itchy patches on the legs. A yellowish or light brown discoloration is also present. Due to scratching or other injury, these patches can become an ulcer. This ulcer may remain for long periods of time. The ulcer can also become infected. Swelling of the legs is often present with stasis dermatitis. If the leg is swollen, this increases the risk of infection and further damage to the skin. Sometimes, intense itching, tingling, and burning occurs before signs of stasis dermatitis appear. You may find yourself scratching the insides of your ankles or rubbing your ankles together before the rash appears. After healing, there are often brown spots on the affected skin. DIAGNOSIS  Your  caregiver makes this diagnosis based on an exam. Other tests may be done to better understand the cause. TREATMENT  If underlying conditions are present, they must be treated. Some of these conditions are heart failure, thyroid problems, poor nutrition, and varicose veins.  Cortisone creams and ointments applied to the skin (topically) may be needed, as well as medicine to reduce swelling in the legs (diuretics).   Compression stockings or an elastic wrap may also be needed to reduce swelling.   If there is an infection, antibiotic medicines may also be used.  HOME CARE INSTRUCTIONS   Try to rest and raise (elevate) the affected leg above the level of the heart, if possible.   Burow's solution wet packs applied for 30 minutes, 3 times daily, will help the weepy rash. Stop using the packs before your skin gets too dry. You can also use a mixture of 3 parts white vinegar to 1 quart water.   Grease your legs daily with ointments, such as petroleum jelly, to fight dryness.   Avoid scratching or injuring the area.  SEEK IMMEDIATE MEDICAL CARE IF:  Your rash gets worse.   An ulcer forms.   You have an oral temperature above 102 F (38.9 C), not controlled by medicine.   You have any other severe symptoms.  Document Released: 07/21/2005 Document Revised: 03/31/2011 Document Reviewed: 08/31/2009 [facility] Patient Information 2012 [place], [place].

## 2011-10-04 ENCOUNTER — Encounter: Payer: Self-pay | Admitting: Endocrinology

## 2011-10-04 ENCOUNTER — Ambulatory Visit (INDEPENDENT_AMBULATORY_CARE_PROVIDER_SITE_OTHER): Payer: Medicare Other | Admitting: Endocrinology

## 2011-10-04 ENCOUNTER — Ambulatory Visit (HOSPITAL_COMMUNITY): Payer: Medicare Other

## 2011-10-04 VITALS — BP 112/64 | HR 73 | Temp 98.2°F | Ht 68.0 in | Wt 223.0 lb

## 2011-10-04 MED ORDER — METFORMIN HCL ER 500 MG PO TB24: 500.0000 mg | ORAL_TABLET | Freq: Every day | ORAL | Status: DC

## 2011-10-04 NOTE — Patient Instructions (Addendum)
good diet and exercise habits significanly improve the control of your diabetes.  please let me know if you wish to be referred to a dietician.  high blood sugar is very risky to your health.  you should see an eye doctor every year. controlling your blood pressure and cholesterol drastically reduces the damage diabetes does to your body.  this also applies to quitting smoking.  please discuss these with your doctor.  you should take an aspirin every day, unless you have been advised by a doctor not to. check your blood sugar once a day.  vary the time of day when you check, between before the 3 meals, and at bedtime.  also check if you have symptoms of your blood sugar being too high or too low.  please keep a record of the readings and bring it to your next appointment here.  please call us sooner if your blood sugar goes below 70, or if it stays over 200. Please come back for a follow-up appointment in 6 months. i have sent a prescription to your pharmacy, for "metformin."

## 2011-10-04 NOTE — Progress Notes (Signed)
Subjective:    Patient ID: [name] [name], female    DOB: [birth year], 71 y.o.   MRN: [id number]  HPI pt has had elev a1c x 4 years.  she is unaware of any chronic complications of dm. she has never been on medication for dm.  pt says her diet is improved, and exercise is limited by health probs. Pt states 1 year of moderate edema of the legs, and assoc weight gain.   Past Medical History  Diagnosis Date  . Migraine, unspecified, without mention of intractable migraine without mention of status migrainosus   . HLD (hyperlipidemia)   . Aortic insufficiency   . Colon polyps   . Hypothyroidism   . GERD (gastroesophageal reflux disease)   . Osteoarthritis   . HTN (hypertension)   . Depression   . PAF (paroxysmal atrial fibrillation)     on coumadin and Cardiazem  . Asthma   . Allergic rhinitis   . Chronic anticoagulation   . Obesity     Past Surgical History  Procedure Date  . Appendectomy   . Abdominal hysterectomy   . Tubal ligation   . US echocardiography 2007    History   Social History  . Marital Status: Married    Spouse Name: N/A    Number of Children: 3  . Years of Education: N/A   Occupational History  . retired [employer] fundraiser    Social History Main Topics  . Smoking status: Never Smoker   . Smokeless tobacco: Never Used  . Alcohol Use: Not on file  . Drug Use: No  . Sexually Active: Not Currently   Other Topics Concern  . Not on file   Social History Narrative  . No narrative on file    Current Outpatient Prescriptions on File Prior to Visit  Medication Sig Dispense Refill  . albuterol (PROAIR HFA) 108 (90 BASE) MCG/ACT inhaler Inhale 2 puffs into the lungs every 6 (six) hours as needed.      . calcium-vitamin D (OSCAL WITH D 500-200) 500-200 MG-UNIT per tablet Take 2 tablets by mouth daily.       Marland Kitchen esomeprazole (NEXIUM) 40 MG capsule TAKE 1 CAPSULE BY MOUTH TWICE A DAY  60 capsule  11  . furosemide (LASIX) 40 MG tablet Take 1 tablet (40 mg total) by  mouth daily.  60 tablet  3  . Glucosamine-Chondroit-Vit C-Mn (GLUCOSAMINE 1500 COMPLEX PO) Take by mouth daily.       Marland Kitchen levothyroxine (SYNTHROID, LEVOTHROID) 25 MCG tablet Take 1 tablet (25 mcg total) by mouth daily.  90 tablet  3  . Loratadine (CLARITIN PO) Take by mouth.      . mometasone-formoterol (DULERA) 100-5 MCG/ACT AERO Inhale 2 puffs into the lungs as needed.      . montelukast (SINGULAIR) 10 MG tablet Take 1 tablet (10 mg total) by mouth at bedtime.  30 tablet  11  . olmesartan (BENICAR) 40 MG tablet Take 1 tablet (40 mg total) by mouth daily.  56 tablet  0  . potassium chloride SA (K-DUR,KLOR-CON) 20 MEQ tablet Take 20 mEq by mouth daily.      . rosuvastatin (CRESTOR) 10 MG tablet Take 5 mg by mouth daily.       Marland Kitchen warfarin (COUMADIN) 5 MG tablet TAKE AS DIRECTED BY THE ANTICOAGULATION CLINIC.  135 tablet  1    Allergies  Allergen Reactions  . Cardizem (Diltiazem Hcl)     edema  . Acetaminophen   . Azithromycin   .  Cefaclor   . Cephalexin   . Clindamycin   . Ibuprofen   . Levofloxacin   . Nsaids     Respiratory-Asthma  . Penicillins   . Pravastatin Sodium   . Simvastatin   . Sulfonamide Derivatives   . Tetracycline     Family History  Problem Relation Age of Onset  . Heart disease    . Hypertension    . COPD    . Crohn's disease    . Diabetes    . Depression    . Dementia    DM: mother. BP 112/64  Pulse 73  Temp(Src) 98.2 F (36.8 C) (Oral)  Ht 5\' 8"  (1.727 m)  Wt 223 lb (101.152 kg)  BMI 33.91 kg/m2  SpO2 94%  LMP 12/01/2010  Review of Systems denies fever, blurry vision, headache, chest pain, n/v, urinary frequency, cramps, excessive diaphoresis, memory loss, depression, and hypoglycemia. She attributes doe to asthma, easy bruising, rhinorrhea, and chronic arthralgias.     Objective:   Physical Exam VS: see vs page GEN: no distress.  obese HEAD: head: no deformity eyes: no periorbital swelling, no proptosis external nose and ears are  normal mouth: no lesion seen NECK: supple, thyroid is not enlarged CHEST WALL: no deformity LUNGS:  Clear to auscultation. CV: reg rate and rhythm, no murmur ABD: abdomen is soft, nontender.  no hepatosplenomegaly.  not distended.  no hernia MUSCULOSKELETAL: muscle bulk and strength are grossly normal.  no obvious joint swelling.  gait is normal and steady EXTEMITIES: no deformity.  no ulcer on the feet.  feet are of normal color and temp.  1+ bilat leg edema. There is bilateral onychomycosis PULSES: dorsalis pedis intact bilat.  no carotid bruit NEURO:  cn 2-12 grossly intact.   readily moves all 4's.  sensation is intact to touch on the feet SKIN:  Normal texture and temperature.  No rash or suspicious lesion is visible.   NODES:  None palpable at the neck PSYCH: alert, oriented x3.  Does not appear anxious nor depressed. Lab Results  Component Value Date   HGBA1C 6.2 09/01/2011      Assessment & Plan:  DM.  Metformin has been found to slow the progression of this dz Edema.  This is a relative contraindication to actos.  Depression.  This complicates the rx of DM. HTN.  well-controlled

## 2011-10-05 ENCOUNTER — Other Ambulatory Visit: Payer: Self-pay

## 2011-10-05 DIAGNOSIS — IMO0001 Reserved for inherently not codable concepts without codable children: Secondary | ICD-10-CM

## 2011-10-05 DIAGNOSIS — I10 Essential (primary) hypertension: Secondary | ICD-10-CM

## 2011-10-05 MED ORDER — OLMESARTAN MEDOXOMIL 40 MG PO TABS: 40.0000 mg | ORAL_TABLET | Freq: Every day | ORAL | Status: DC

## 2011-10-07 ENCOUNTER — Ambulatory Visit (HOSPITAL_COMMUNITY)
Admission: RE | Admit: 2011-10-07 | Discharge: 2011-10-07 | Disposition: A | Discharge status: Home / Self Care | Payer: Medicare Other | Source: Ambulatory Visit | Attending: Internal Medicine | Admitting: Internal Medicine

## 2011-10-07 DIAGNOSIS — Z1231 Encounter for screening mammogram for malignant neoplasm of breast: Secondary | ICD-10-CM

## 2011-10-10 ENCOUNTER — Ambulatory Visit (INDEPENDENT_AMBULATORY_CARE_PROVIDER_SITE_OTHER): Payer: Medicare Other | Admitting: *Deleted

## 2011-10-10 DIAGNOSIS — I4891 Unspecified atrial fibrillation: Secondary | ICD-10-CM | POA: Diagnosis not present

## 2011-10-10 LAB — HM MAMMOGRAPHY
HM Mammogram: NORMAL
HM Mammogram: NORMAL

## 2011-10-31 ENCOUNTER — Other Ambulatory Visit: Payer: Self-pay

## 2011-10-31 MED ORDER — ROSUVASTATIN CALCIUM 10 MG PO TABS: 5.0000 mg | ORAL_TABLET | Freq: Every day | ORAL | Status: DC

## 2011-11-04 ENCOUNTER — Other Ambulatory Visit: Payer: Self-pay | Admitting: Internal Medicine

## 2011-11-04 MED ORDER — ROSUVASTATIN CALCIUM 10 MG PO TABS: 5.0000 mg | ORAL_TABLET | Freq: Every day | ORAL | Status: DC

## 2011-11-07 ENCOUNTER — Ambulatory Visit (INDEPENDENT_AMBULATORY_CARE_PROVIDER_SITE_OTHER): Payer: Medicare Other | Admitting: *Deleted

## 2011-11-07 DIAGNOSIS — I4891 Unspecified atrial fibrillation: Secondary | ICD-10-CM | POA: Diagnosis not present

## 2011-11-07 LAB — POCT INR: INR: 2.2

## 2011-12-08 ENCOUNTER — Ambulatory Visit (INDEPENDENT_AMBULATORY_CARE_PROVIDER_SITE_OTHER): Payer: Medicare Other

## 2011-12-08 ENCOUNTER — Encounter: Payer: Self-pay | Admitting: Nurse Practitioner

## 2011-12-08 ENCOUNTER — Ambulatory Visit (INDEPENDENT_AMBULATORY_CARE_PROVIDER_SITE_OTHER): Payer: Medicare Other | Admitting: Nurse Practitioner

## 2011-12-08 VITALS — BP 132/70 | HR 73 | Ht 68.0 in | Wt 208.0 lb

## 2011-12-08 DIAGNOSIS — R609 Edema, unspecified: Secondary | ICD-10-CM

## 2011-12-08 DIAGNOSIS — I4891 Unspecified atrial fibrillation: Secondary | ICD-10-CM

## 2011-12-08 DIAGNOSIS — I519 Heart disease, unspecified: Secondary | ICD-10-CM | POA: Diagnosis not present

## 2011-12-08 DIAGNOSIS — I5189 Other ill-defined heart diseases: Secondary | ICD-10-CM

## 2011-12-08 LAB — POCT INR: INR: 1.5

## 2011-12-08 NOTE — Assessment & Plan Note (Signed)
Her swelling has improved. I have given her the ok to cut her Lasix back to a half a tablet. She is to continue with her support stockings.

## 2011-12-08 NOTE — Progress Notes (Signed)
[name] [name] Date of Birth: 1940/08/26 Medical Record #[id number]  History of Present Illness: [name] [name] is seen back today for a 3 month check. She is seen for Dr. Jens Som. She has HTN, PAF, HLD, obesity and diastolic dysfunction. She is on chronic coumadin. Her EF is normal at 55 to 65% but she does have grade 1 diastolic dysfunction. She has no known CAD and has had remote cath by Dr. Fraser Din back in 2003 showing no obstructive disease. I saw her back in May with complaints of edema and being anxious. Her medicines had been adjusted. Exercise was strongly encouraged.   She comes in today. She is here alone. She is doing pretty good. She has lost weight. She is walking more. Not going to the pool but is more active. Her swelling is under control with support stockings. She will sometime skip her Lasix. No chest pain. Not short of breath.   Current Outpatient Prescriptions on File Prior to Visit  Medication Sig Dispense Refill  . albuterol (PROAIR HFA) 108 (90 BASE) MCG/ACT inhaler Inhale 2 puffs into the lungs every 6 (six) hours as needed.      . calcium-vitamin D (OSCAL WITH D 500-200) 500-200 MG-UNIT per tablet Take 2 tablets by mouth daily.       Marland Kitchen esomeprazole (NEXIUM) 40 MG capsule TAKE 1 CAPSULE BY MOUTH TWICE A DAY  60 capsule  11  . furosemide (LASIX) 40 MG tablet Take 1 tablet (40 mg total) by mouth daily.  60 tablet  3  . Glucosamine-Chondroit-Vit C-Mn (GLUCOSAMINE 1500 COMPLEX PO) Take by mouth daily.       Marland Kitchen levothyroxine (SYNTHROID, LEVOTHROID) 25 MCG tablet Take 1 tablet (25 mcg total) by mouth daily.  90 tablet  3  . Loratadine (CLARITIN PO) Take by mouth.      . metFORMIN (GLUCOPHAGE-XR) 500 MG 24 hr tablet Take 1 tablet (500 mg total) by mouth daily with breakfast.  30 tablet  11  . mometasone-formoterol (DULERA) 100-5 MCG/ACT AERO Inhale 2 puffs into the lungs as needed.      . montelukast (SINGULAIR) 10 MG tablet Take 1 tablet (10 mg total) by mouth at bedtime.   30 tablet  11  . olmesartan (BENICAR) 40 MG tablet Take 1 tablet (40 mg total) by mouth daily.  90 tablet  1  . potassium chloride SA (K-DUR,KLOR-CON) 20 MEQ tablet Take 20 mEq by mouth daily.      . rosuvastatin (CRESTOR) 10 MG tablet Take 0.5 tablets (5 mg total) by mouth daily.  30 tablet  11  . warfarin (COUMADIN) 5 MG tablet TAKE AS DIRECTED BY THE ANTICOAGULATION CLINIC.  135 tablet  1    Allergies  Allergen Reactions  . Cardizem (Diltiazem Hcl)     edema  . Acetaminophen   . Azithromycin   . Cefaclor   . Cephalexin   . Clindamycin   . Ibuprofen   . Levofloxacin   . Nsaids     Respiratory-Asthma  . Penicillins   . Pravastatin Sodium   . Simvastatin   . Sulfonamide Derivatives   . Tetracycline     Past Medical History  Diagnosis Date  . Migraine, unspecified, without mention of intractable migraine without mention of status migrainosus   . HLD (hyperlipidemia)   . Aortic insufficiency   . Colon polyps   . Hypothyroidism   . GERD (gastroesophageal reflux disease)   . Osteoarthritis   . HTN (hypertension)   . Depression   .  PAF (paroxysmal atrial fibrillation)     on coumadin and Cardiazem  . Asthma   . Allergic rhinitis   . Chronic anticoagulation     on coumadin  . Obesity   . Diastolic dysfunction   . S/P cardiac cath 2003    no obstructive disease     Past Surgical History  Procedure Date  . Appendectomy   . Abdominal hysterectomy   . Tubal ligation   . US echocardiography 2007    History  Smoking status  . Never Smoker   Smokeless tobacco  . Never Used    History  Alcohol Use: Not on file    Family History  Problem Relation Age of Onset  . Heart disease    . Hypertension    . COPD    . Crohn's disease    . Diabetes    . Depression    . Dementia      Review of Systems: The review of systems is per the HPI.  All other systems were reviewed and are negative.  Physical Exam: BP 132/70  Pulse 73  Ht 5\' 8"  (1.727 m)  Wt 208 lb  (94.348 kg)  BMI 31.63 kg/m2  SpO2 95%  LMP 12/01/2010 Her weight is down 23 pounds.  Patient is very pleasant and in no acute distress. She is obese but thinner today. Skin is warm and dry. Color is normal.  HEENT is unremarkable. Normocephalic/atraumatic. PERRL. Sclera are nonicteric. Neck is supple. No masses. No JVD. Lungs are clear. Cardiac exam shows a regular rate and rhythm. Abdomen is soft. Extremities are with just trace edema. She has her support stockings in place. Gait and ROM are intact. No gross neurologic deficits noted.   LABORATORY DATA:  Lab Results  Component Value Date   WBC 9.3 09/01/2011   HGB 12.5 09/01/2011   HCT 37.3 09/01/2011   PLT 338.0 09/01/2011   GLUCOSE 109* 09/23/2011   CHOL 166 12/01/2010   TRIG 147.0 12/01/2010   HDL 69.90 12/01/2010   LDLCALC 67 12/01/2010   ALT 20 09/01/2011   AST 25 09/01/2011   NA 140 09/23/2011   K 3.6 09/23/2011   CL 104 09/23/2011   CREATININE 0.8 09/23/2011   BUN 15 09/23/2011   CO2 29 09/23/2011   TSH 3.95 09/01/2011   INR 2.2 11/07/2011   HGBA1C 6.2 09/01/2011   Lab Results  Component Value Date   INR 2.2 11/07/2011   INR 2.1 10/10/2011   INR 2.3 09/12/2011   PROTIME 18.2 10/01/2008     Assessment / Plan:

## 2011-12-08 NOTE — Patient Instructions (Addendum)
You can cut your Lasix in half. May increase it back if your swelling.  I think you are doing well.  Come see me in 4 months  Stay active  Call the [facility] office at [phone] if you have any questions, problems or concerns.

## 2011-12-08 NOTE — Assessment & Plan Note (Addendum)
She is doing better. Swelling has improved. BP looks good. She has lost weight. Rhythm has been stable. I am quite pleased with her progress. I have encouraged her to stay on track with her weight and exercise. I think if she continues with this kind of progress, then she may be able to get off some medicines in the future.  We will see her back in about 4 months. Patient is agreeable to this plan and will call if any problems develop in the interim.

## 2011-12-27 ENCOUNTER — Ambulatory Visit (INDEPENDENT_AMBULATORY_CARE_PROVIDER_SITE_OTHER): Payer: Medicare Other | Admitting: Pharmacist

## 2011-12-27 DIAGNOSIS — I4891 Unspecified atrial fibrillation: Secondary | ICD-10-CM | POA: Diagnosis not present

## 2011-12-27 LAB — POCT INR: INR: 1.5

## 2011-12-28 ENCOUNTER — Encounter: Payer: Self-pay | Admitting: Pharmacist

## 2012-01-10 ENCOUNTER — Ambulatory Visit (INDEPENDENT_AMBULATORY_CARE_PROVIDER_SITE_OTHER): Payer: Medicare Other | Admitting: Pharmacist

## 2012-01-10 DIAGNOSIS — I4891 Unspecified atrial fibrillation: Secondary | ICD-10-CM

## 2012-01-20 ENCOUNTER — Encounter: Payer: Self-pay | Admitting: Gastroenterology

## 2012-01-24 ENCOUNTER — Ambulatory Visit (INDEPENDENT_AMBULATORY_CARE_PROVIDER_SITE_OTHER): Payer: Medicare Other | Admitting: General Practice

## 2012-01-24 ENCOUNTER — Ambulatory Visit (INDEPENDENT_AMBULATORY_CARE_PROVIDER_SITE_OTHER): Payer: Medicare Other | Admitting: Internal Medicine

## 2012-01-24 ENCOUNTER — Other Ambulatory Visit (INDEPENDENT_AMBULATORY_CARE_PROVIDER_SITE_OTHER): Payer: Medicare Other

## 2012-01-24 ENCOUNTER — Encounter: Payer: Self-pay | Admitting: Internal Medicine

## 2012-01-24 VITALS — BP 130/70 | HR 71 | Temp 98.6°F | Resp 16 | Wt 205.5 lb

## 2012-01-24 DIAGNOSIS — IMO0001 Reserved for inherently not codable concepts without codable children: Secondary | ICD-10-CM

## 2012-01-24 DIAGNOSIS — I10 Essential (primary) hypertension: Secondary | ICD-10-CM | POA: Diagnosis not present

## 2012-01-24 DIAGNOSIS — N318 Other neuromuscular dysfunction of bladder: Secondary | ICD-10-CM | POA: Diagnosis not present

## 2012-01-24 DIAGNOSIS — E039 Hypothyroidism, unspecified: Secondary | ICD-10-CM

## 2012-01-24 DIAGNOSIS — I4891 Unspecified atrial fibrillation: Secondary | ICD-10-CM | POA: Diagnosis not present

## 2012-01-24 DIAGNOSIS — Z23 Encounter for immunization: Secondary | ICD-10-CM

## 2012-01-24 DIAGNOSIS — E785 Hyperlipidemia, unspecified: Secondary | ICD-10-CM | POA: Diagnosis not present

## 2012-01-24 DIAGNOSIS — J45909 Unspecified asthma, uncomplicated: Secondary | ICD-10-CM

## 2012-01-24 DIAGNOSIS — N3281 Overactive bladder: Secondary | ICD-10-CM

## 2012-01-24 LAB — COMPREHENSIVE METABOLIC PANEL
ALT: 19 U/L (ref 0–35)
AST: 23 U/L (ref 0–37)
Alkaline Phosphatase: 81 U/L (ref 39–117)
Potassium: 3.9 mEq/L (ref 3.5–5.1)
Sodium: 137 mEq/L (ref 135–145)
Total Bilirubin: 0.7 mg/dL (ref 0.3–1.2)
Total Protein: 7.2 g/dL (ref 6.0–8.3)

## 2012-01-24 LAB — LIPID PANEL
HDL: 58.6 mg/dL (ref >39.00)
LDL Cholesterol: 73 mg/dL (ref 0–99)
Total CHOL/HDL Ratio: 3
VLDL: 22.8 mg/dL (ref 0.0–40.0)

## 2012-01-24 LAB — TSH: TSH: 5.08 u[IU]/mL (ref 0.35–5.50)

## 2012-01-24 LAB — HEMOGLOBIN A1C: Hgb A1c MFr Bld: 6 % (ref 4.6–6.5)

## 2012-01-24 MED ORDER — BUDESONIDE-FORMOTEROL FUMARATE 160-4.5 MCG/ACT IN AERO: 2.0000 | INHALATION_SPRAY | Freq: Two times a day (BID) | RESPIRATORY_TRACT | Status: DC

## 2012-01-24 MED ORDER — MIRABEGRON ER 25 MG PO TB24: 25.0000 mg | ORAL_TABLET | Freq: Every day | ORAL | Status: DC

## 2012-01-24 MED ORDER — ALBUTEROL SULFATE HFA 108 (90 BASE) MCG/ACT IN AERS: 2.0000 | INHALATION_SPRAY | Freq: Four times a day (QID) | RESPIRATORY_TRACT | Status: DC | PRN

## 2012-01-24 NOTE — Assessment & Plan Note (Signed)
I will check her a1c today and will monitor her renal function 

## 2012-01-24 NOTE — Assessment & Plan Note (Signed)
FLP today 

## 2012-01-24 NOTE — Progress Notes (Signed)
  Subjective:    Patient ID: [name] [name], female    DOB: 01-28-1941, 71 y.o.   MRN: [id number]  Hypertension This is a chronic problem. The current episode started more than 1 year ago. The problem has been gradually improving since onset. The problem is controlled. Pertinent negatives include no anxiety, blurred vision, chest pain, headaches, malaise/fatigue, neck pain, orthopnea, palpitations, peripheral edema, PND, shortness of breath or sweats. There are no associated agents to hypertension. Risk factors for coronary artery disease include obesity. Past treatments include angiotensin blockers. The current treatment provides moderate improvement. Compliance problems include exercise.       Review of Systems  Constitutional: Negative for fever, chills, malaise/fatigue, diaphoresis, activity change, appetite change, fatigue and unexpected weight change.  HENT: Negative.  Negative for neck pain.   Eyes: Negative.  Negative for blurred vision.  Respiratory: Negative for cough, choking, chest tightness, shortness of breath, wheezing and stridor.   Cardiovascular: Negative for chest pain, palpitations, orthopnea, leg swelling and PND.  Gastrointestinal: Negative for nausea, vomiting, abdominal pain, diarrhea, constipation and blood in stool.  Genitourinary: Positive for urgency and difficulty urinating (frequent urinations with urgency, leaking, accidents, incontinence). Negative for dysuria, frequency, hematuria, flank pain, decreased urine volume and enuresis.  Musculoskeletal: Negative.   Skin: Negative.   Neurological: Negative for headaches.  Hematological: Negative for adenopathy. Does not bruise/bleed easily.  Psychiatric/Behavioral: Negative.        Objective:   Physical Exam  Vitals reviewed. Constitutional: She is oriented to person, place, and time. She appears well-developed and well-nourished. No distress.  HENT:  Head: Normocephalic and atraumatic.  Mouth/Throat:  Oropharynx is clear and moist. No oropharyngeal exudate.  Eyes: Conjunctivae normal are normal. Right eye exhibits no discharge. Left eye exhibits no discharge. No scleral icterus.  Neck: Normal range of motion. Neck supple. No JVD present. No tracheal deviation present. No thyromegaly present.  Cardiovascular: Normal rate, regular rhythm, normal heart sounds and intact distal pulses.  Exam reveals no gallop and no friction rub.   No murmur heard. Pulmonary/Chest: Effort normal and breath sounds normal. No stridor. No respiratory distress. She has no wheezes. She has no rales. She exhibits no tenderness.  Abdominal: Soft. Bowel sounds are normal. She exhibits no distension and no mass. There is no tenderness. There is no rebound and no guarding.  Musculoskeletal: Normal range of motion. She exhibits no edema and no tenderness.  Lymphadenopathy:    She has no cervical adenopathy.  Neurological: She is oriented to person, place, and time.  Skin: Skin is warm and dry. No rash noted. She is not diaphoretic. No erythema. No pallor.  Psychiatric: She has a normal mood and affect. Her behavior is normal. Judgment and thought content normal.      Lab Results  Component Value Date   WBC 9.3 09/01/2011   HGB 12.5 09/01/2011   HCT 37.3 09/01/2011   PLT 338.0 09/01/2011   GLUCOSE 109* 09/23/2011   CHOL 166 12/01/2010   TRIG 147.0 12/01/2010   HDL 69.90 12/01/2010   LDLCALC 67 12/01/2010   ALT 20 09/01/2011   AST 25 09/01/2011   NA 140 09/23/2011   K 3.6 09/23/2011   CL 104 09/23/2011   CREATININE 0.8 09/23/2011   BUN 15 09/23/2011   CO2 29 09/23/2011   TSH 3.95 09/01/2011   INR 2.1 01/24/2012   HGBA1C 6.2 09/01/2011      Assessment & Plan:

## 2012-01-24 NOTE — Assessment & Plan Note (Signed)
She will try myrbetriq 

## 2012-01-24 NOTE — Assessment & Plan Note (Signed)
She has good rate and rhythm control 

## 2012-01-24 NOTE — Assessment & Plan Note (Signed)
I will check her TSH today and will adjust the dose if needed 

## 2012-01-24 NOTE — Telephone Encounter (Signed)
This encounter was created in error - please disregard.

## 2012-01-24 NOTE — Patient Instructions (Signed)

## 2012-01-24 NOTE — Assessment & Plan Note (Signed)
She tells me that symbicort works better than dulera for her

## 2012-01-24 NOTE — Assessment & Plan Note (Signed)
Her BP is well controlled 

## 2012-01-25 ENCOUNTER — Encounter: Payer: Self-pay | Admitting: Internal Medicine

## 2012-02-08 LAB — HM DIABETES EYE EXAM: HM Diabetic Eye Exam: NORMAL

## 2012-02-14 ENCOUNTER — Ambulatory Visit (INDEPENDENT_AMBULATORY_CARE_PROVIDER_SITE_OTHER): Payer: Medicare Other | Admitting: General Practice

## 2012-02-14 DIAGNOSIS — I4891 Unspecified atrial fibrillation: Secondary | ICD-10-CM

## 2012-02-15 DIAGNOSIS — H2510 Age-related nuclear cataract, unspecified eye: Secondary | ICD-10-CM | POA: Diagnosis not present

## 2012-02-27 ENCOUNTER — Other Ambulatory Visit: Payer: Self-pay | Admitting: General Practice

## 2012-02-27 ENCOUNTER — Other Ambulatory Visit: Payer: Self-pay | Admitting: Cardiology

## 2012-02-27 MED ORDER — WARFARIN SODIUM 5 MG PO TABS: 5.0000 mg | ORAL_TABLET | Freq: Every day | ORAL | Status: DC

## 2012-03-06 ENCOUNTER — Other Ambulatory Visit: Payer: Self-pay | Admitting: Cardiology

## 2012-03-13 ENCOUNTER — Ambulatory Visit (INDEPENDENT_AMBULATORY_CARE_PROVIDER_SITE_OTHER): Payer: Medicare Other | Admitting: General Practice

## 2012-03-13 DIAGNOSIS — I4891 Unspecified atrial fibrillation: Secondary | ICD-10-CM

## 2012-04-04 ENCOUNTER — Ambulatory Visit (INDEPENDENT_AMBULATORY_CARE_PROVIDER_SITE_OTHER): Payer: Medicare Other | Admitting: Endocrinology

## 2012-04-04 ENCOUNTER — Encounter: Payer: Self-pay | Admitting: Endocrinology

## 2012-04-04 VITALS — BP 136/78 | HR 78 | Temp 98.5°F | Wt 204.0 lb

## 2012-04-04 NOTE — Progress Notes (Signed)
Subjective:    Patient ID: [name] [name], female    DOB: 09-21-1940, 71 y.o.   MRN: [id number]  HPI pt has had elev a1c x 4 years.  she is unaware of any chronic complications of dm.  pt states she feels well in general. Past Medical History  Diagnosis Date  . Migraine, unspecified, without mention of intractable migraine without mention of status migrainosus   . HLD (hyperlipidemia)   . Aortic insufficiency   . Colon polyps   . Hypothyroidism   . GERD (gastroesophageal reflux disease)   . Osteoarthritis   . HTN (hypertension)   . Depression   . PAF (paroxysmal atrial fibrillation)     on coumadin and Cardiazem  . Asthma   . Allergic rhinitis   . Chronic anticoagulation     on coumadin  . Obesity   . Diastolic dysfunction   . S/P cardiac cath 2003    no obstructive disease     Past Surgical History  Procedure Date  . Appendectomy   . Abdominal hysterectomy   . Tubal ligation   . US echocardiography 2007    History   Social History  . Marital Status: Married    Spouse Name: N/A    Number of Children: 3  . Years of Education: N/A   Occupational History  . retired Charity fundraiser    Social History Main Topics  . Smoking status: Never Smoker   . Smokeless tobacco: Never Used  . Alcohol Use: 3.0 oz/week    5 Glasses of wine per week  . Drug Use: No  . Sexually Active: Not Currently   Other Topics Concern  . Not on file   Social History Narrative  . No narrative on file    Current Outpatient Prescriptions on File Prior to Visit  Medication Sig Dispense Refill  . albuterol (PROAIR HFA) 108 (90 BASE) MCG/ACT inhaler Inhale 2 puffs into the lungs every 6 (six) hours as needed.  1 Inhaler  11  . budesonide-formoterol (SYMBICORT) 160-4.5 MCG/ACT inhaler Inhale 2 puffs into the lungs 2 (two) times daily.  1 Inhaler  11  . calcium-vitamin D (OSCAL WITH D 500-200) 500-200 MG-UNIT per tablet Take 2 tablets by mouth daily.       Marland Kitchen esomeprazole (NEXIUM) 40 MG capsule  TAKE 1 CAPSULE BY MOUTH TWICE A DAY  60 capsule  11  . Glucosamine-Chondroit-Vit C-Mn (GLUCOSAMINE 1500 COMPLEX PO) Take by mouth daily.       Marland Kitchen levothyroxine (SYNTHROID, LEVOTHROID) 25 MCG tablet Take 1 tablet (25 mcg total) by mouth daily.  90 tablet  3  . Loratadine (CLARITIN PO) Take by mouth.      . metFORMIN (GLUCOPHAGE-XR) 500 MG 24 hr tablet Take 1 tablet (500 mg total) by mouth daily with breakfast.  30 tablet  11  . mirabegron ER (MYRBETRIQ) 25 MG TB24 Take 1 tablet (25 mg total) by mouth daily.  30 tablet  11  . montelukast (SINGULAIR) 10 MG tablet Take 1 tablet (10 mg total) by mouth at bedtime.  30 tablet  11  . olmesartan (BENICAR) 40 MG tablet Take 1 tablet (40 mg total) by mouth daily.  90 tablet  1  . rosuvastatin (CRESTOR) 10 MG tablet Take 0.5 tablets (5 mg total) by mouth daily.  30 tablet  11  . warfarin (COUMADIN) 5 MG tablet Take 1 tablet (5 mg total) by mouth daily. Take as directed by coumadin clinic.  135 tablet  0  Allergies  Allergen Reactions  . Cardizem (Diltiazem Hcl)     edema  . Acetaminophen   . Azithromycin   . Cefaclor   . Cephalexin   . Clindamycin   . Ibuprofen   . Levofloxacin   . Nsaids     Respiratory-Asthma  . Penicillins   . Pravastatin Sodium   . Simvastatin   . Sulfonamide Derivatives   . Tetracycline     Family History  Problem Relation Age of Onset  . Heart disease    . Hypertension    . COPD    . Crohn's disease    . Diabetes    . Depression    . Dementia      BP 136/78  Pulse 78  Temp 98.5 F (36.9 C) (Oral)  Wt 204 lb (92.534 kg)  SpO2 93%  LMP 12/01/2010   Review of Systems She has lost weight, due to her efforts.      Objective:   Physical Exam VITAL SIGNS:  See vs page GENERAL: no distress Ext: trace bilat leg edema   Lab Results  Component Value Date   HGBA1C 6.0 01/24/2012      Assessment & Plan:  DM: well-controlled

## 2012-04-04 NOTE — Patient Instructions (Addendum)
Please continue the same medications Please return in 6 months.

## 2012-04-10 ENCOUNTER — Ambulatory Visit (INDEPENDENT_AMBULATORY_CARE_PROVIDER_SITE_OTHER): Payer: Medicare Other | Admitting: General Practice

## 2012-04-10 DIAGNOSIS — I4891 Unspecified atrial fibrillation: Secondary | ICD-10-CM | POA: Diagnosis not present

## 2012-04-11 ENCOUNTER — Ambulatory Visit (INDEPENDENT_AMBULATORY_CARE_PROVIDER_SITE_OTHER): Payer: Medicare Other | Admitting: Nurse Practitioner

## 2012-04-11 ENCOUNTER — Encounter: Payer: Self-pay | Admitting: Nurse Practitioner

## 2012-04-11 VITALS — BP 178/74 | HR 60 | Ht 68.0 in | Wt 201.8 lb

## 2012-04-11 DIAGNOSIS — I10 Essential (primary) hypertension: Secondary | ICD-10-CM | POA: Diagnosis not present

## 2012-04-11 NOTE — Patient Instructions (Addendum)
Keep up the good work with your diet and weight loss  Use Coricidin HBP for cold symptoms  Monitor your blood pressure at home especially for the next few days - let us know if your BP is staying above 140 to 150 consistently because if so, we will need to add more medicines.  See Dr. Jens Som in 6 months  Call the [facility] office at [phone] if you have any questions, problems or concerns.

## 2012-04-11 NOTE — Progress Notes (Signed)
[name] [name] Date of Birth: 11-Feb-1941 Medical Record #[id number]  History of Present Illness: [name] [name] is seen back today for a 4 month check. She is seen for Dr. Jens Som. She has HTN, PAF, HLD, obesity and diastolic dysfunction. She is on chronic coumadin. EF is normal at 55 to 65% but she does have grade 1 diastolic dysfunction. Has no known CAD and has had remote cath by Dr. Fraser Din dating back to 2003 showing no obstructive disease.   She was last seen in August. Felt to be doing well. Was losing weight. We cut her Lasix back at that visit.   She comes in today. She is here alone. She is doing ok. She continues to lose weight. Is down to 197 on her scales at home. BP yesterday at her coumadin check was 123/80. Today it is high. She has been taking pseudophed products today. Has had chest congestion/sinusitis/asthma for 6 weeks and is frustrated. She has multiple antibiotic allergies/sensitivities. Says she is not able to take SBE (but does not have an indication for SBE). No chest pain. Rhythm has been ok she thinks. Coumadin has been going well. More stress with an unemployed son. Not short of breath.   Current Outpatient Prescriptions on File Prior to Visit  Medication Sig Dispense Refill  . albuterol (PROAIR HFA) 108 (90 BASE) MCG/ACT inhaler Inhale 2 puffs into the lungs every 6 (six) hours as needed.  1 Inhaler  11  . budesonide-formoterol (SYMBICORT) 160-4.5 MCG/ACT inhaler Inhale 2 puffs into the lungs 2 (two) times daily.  1 Inhaler  11  . calcium-vitamin D (OSCAL WITH D 500-200) 500-200 MG-UNIT per tablet Take 2 tablets by mouth daily.       Marland Kitchen esomeprazole (NEXIUM) 40 MG capsule TAKE 1 CAPSULE BY MOUTH TWICE A DAY  60 capsule  11  . Glucosamine-Chondroit-Vit C-Mn (GLUCOSAMINE 1500 COMPLEX PO) Take by mouth daily.       Marland Kitchen levothyroxine (SYNTHROID, LEVOTHROID) 25 MCG tablet Take 1 tablet (25 mcg total) by mouth daily.  90 tablet  3  . Loratadine (CLARITIN PO) Take by  mouth.      . metFORMIN (GLUCOPHAGE-XR) 500 MG 24 hr tablet Take 1 tablet (500 mg total) by mouth daily with breakfast.  30 tablet  11  . mirabegron ER (MYRBETRIQ) 25 MG TB24 Take 1 tablet (25 mg total) by mouth daily.  30 tablet  11  . montelukast (SINGULAIR) 10 MG tablet Take 1 tablet (10 mg total) by mouth at bedtime.  30 tablet  11  . olmesartan (BENICAR) 40 MG tablet Take 1 tablet (40 mg total) by mouth daily.  90 tablet  1  . rosuvastatin (CRESTOR) 10 MG tablet Take 0.5 tablets (5 mg total) by mouth daily.  30 tablet  11  . warfarin (COUMADIN) 5 MG tablet Take 1 tablet (5 mg total) by mouth daily. Take as directed by coumadin clinic.  135 tablet  0    Allergies  Allergen Reactions  . Cardizem (Diltiazem Hcl)     edema  . Acetaminophen   . Azithromycin   . Cefaclor   . Cephalexin   . Clindamycin   . Ibuprofen   . Levofloxacin   . Nsaids     Respiratory-Asthma  . Penicillins   . Pravastatin Sodium   . Simvastatin   . Sulfonamide Derivatives   . Tetracycline     Past Medical History  Diagnosis Date  . Migraine, unspecified, without mention of intractable migraine without mention of  status migrainosus   . HLD (hyperlipidemia)   . Aortic insufficiency   . Colon polyps   . Hypothyroidism   . GERD (gastroesophageal reflux disease)   . Osteoarthritis   . HTN (hypertension)   . Depression   . PAF (paroxysmal atrial fibrillation)     on coumadin and Cardiazem  . Asthma   . Allergic rhinitis   . Chronic anticoagulation     on coumadin  . Obesity   . Diastolic dysfunction   . S/P cardiac cath 2003    no obstructive disease     Past Surgical History  Procedure Date  . Appendectomy   . Abdominal hysterectomy   . Tubal ligation   . US echocardiography 2007    History  Smoking status  . Never Smoker   Smokeless tobacco  . Never Used    History  Alcohol Use  . 3.0 oz/week  . 5 Glasses of wine per week    Family History  Problem Relation Age of Onset  .  Heart disease    . Hypertension    . COPD    . Crohn's disease    . Diabetes    . Depression    . Dementia      Review of Systems: The review of systems is per the HPI.  All other systems were reviewed and are negative.  Physical Exam: BP 178/74  Pulse 60  Ht 5\' 8"  (1.727 m)  Wt 201 lb 12.8 oz (91.536 kg)  BMI 30.68 kg/m2  LMP 12/01/2010 Weight is down 7 more pounds. Repeat BP by me is still elevated at 180/80.  Patient is very pleasant and in no acute distress. She is obese. Skin is warm and dry. Color is normal.  HEENT is unremarkable. Normocephalic/atraumatic. PERRL. Sclera are nonicteric. Neck is supple. No masses. No JVD. Lungs are clear. Cardiac exam shows a regular rate and rhythm today. Abdomen is soft. Extremities are without edema. Gait and ROM are intact. No gross neurologic deficits noted.  LABORATORY DATA: N/A  Lab Results  Component Value Date   WBC 9.3 09/01/2011   HGB 12.5 09/01/2011   HCT 37.3 09/01/2011   PLT 338.0 09/01/2011   GLUCOSE 105* 01/24/2012   CHOL 154 01/24/2012   TRIG 114.0 01/24/2012   HDL 58.60 01/24/2012   LDLCALC 73 01/24/2012   ALT 19 01/24/2012   AST 23 01/24/2012   NA 137 01/24/2012   K 3.9 01/24/2012   CL 104 01/24/2012   CREATININE 0.8 01/24/2012   BUN 16 01/24/2012   CO2 27 01/24/2012   TSH 5.08 01/24/2012   INR 2.4 04/10/2012   HGBA1C 6.0 01/24/2012    Assessment / Plan: 1. HTN - blood pressure is up and I suspect it is due to the cold medicine she took earlier this morning. I have asked her to monitor her blood pressure especially over the next few days and let us know if she is staying above 140-150 consistently.   2. PAF - in sinus on physical exam today.   3. Obesity - actively losing weight. I have encouraged her to continue.   4. Diastolic dysfunction - looks compensated.   5. Sinusitis - I have suggested Coricidin HBP for her cold symptoms. I was reluctant to give her any antibiotics. I asked her to follow up with her PCP as  needed.  Patient is agreeable to this plan and will call if any problems develop in the interim.

## 2012-04-19 ENCOUNTER — Other Ambulatory Visit: Payer: Self-pay | Admitting: Internal Medicine

## 2012-05-11 ENCOUNTER — Other Ambulatory Visit: Payer: Self-pay | Admitting: Internal Medicine

## 2012-05-22 ENCOUNTER — Ambulatory Visit (INDEPENDENT_AMBULATORY_CARE_PROVIDER_SITE_OTHER): Payer: Medicare Other | Admitting: General Practice

## 2012-05-22 DIAGNOSIS — Z7901 Long term (current) use of anticoagulants: Secondary | ICD-10-CM

## 2012-05-22 DIAGNOSIS — I4891 Unspecified atrial fibrillation: Secondary | ICD-10-CM

## 2012-05-22 LAB — POCT INR: INR: 2.2

## 2012-05-30 ENCOUNTER — Encounter: Payer: Self-pay | Admitting: Internal Medicine

## 2012-05-30 ENCOUNTER — Ambulatory Visit (INDEPENDENT_AMBULATORY_CARE_PROVIDER_SITE_OTHER): Payer: Medicare Other | Admitting: Internal Medicine

## 2012-05-30 ENCOUNTER — Other Ambulatory Visit (INDEPENDENT_AMBULATORY_CARE_PROVIDER_SITE_OTHER): Payer: Medicare Other

## 2012-05-30 VITALS — BP 148/72 | HR 72 | Temp 98.6°F | Resp 16 | Wt 198.0 lb

## 2012-05-30 DIAGNOSIS — I10 Essential (primary) hypertension: Secondary | ICD-10-CM

## 2012-05-30 DIAGNOSIS — E039 Hypothyroidism, unspecified: Secondary | ICD-10-CM

## 2012-05-30 DIAGNOSIS — J309 Allergic rhinitis, unspecified: Secondary | ICD-10-CM | POA: Diagnosis not present

## 2012-05-30 DIAGNOSIS — IMO0001 Reserved for inherently not codable concepts without codable children: Secondary | ICD-10-CM

## 2012-05-30 LAB — COMPREHENSIVE METABOLIC PANEL
BUN: 15 mg/dL (ref 6–23)
CO2: 29 mEq/L (ref 19–32)
Calcium: 9.1 mg/dL (ref 8.4–10.5)
Creatinine, Ser: 0.7 mg/dL (ref 0.4–1.2)
GFR: 82.89 mL/min (ref >60.00)
Glucose, Bld: 104 mg/dL — ABNORMAL HIGH (ref 70–99)
Total Bilirubin: 0.7 mg/dL (ref 0.3–1.2)

## 2012-05-30 LAB — TSH: TSH: 3.56 u[IU]/mL (ref 0.35–5.50)

## 2012-05-30 MED ORDER — FLUTICASONE PROPIONATE 50 MCG/ACT NA SUSP: 2.0000 | Freq: Every day | NASAL | Status: DC

## 2012-05-30 NOTE — Patient Instructions (Signed)

## 2012-05-30 NOTE — Progress Notes (Signed)
Subjective:    Patient ID: [name] [name], female    DOB: [birth year], 72 y.o.   MRN: [id number]  Thyroid Problem Presents for follow-up visit. Symptoms include fatigue and weight gain. Patient reports no anxiety, cold intolerance, constipation, depressed mood, diaphoresis, diarrhea, dry skin, heat intolerance, hoarse voice, leg swelling, nail problem, palpitations, tremors, visual change or weight loss.      Review of Systems  Constitutional: Positive for weight gain and fatigue. Negative for fever, chills, weight loss, diaphoresis, activity change, appetite change and unexpected weight change.  HENT: Positive for congestion, rhinorrhea, sneezing and postnasal drip. Negative for ear pain, nosebleeds, sore throat, hoarse voice, facial swelling, trouble swallowing, neck pain, voice change, sinus pressure and tinnitus.   Eyes: Negative.   Respiratory: Negative for cough, chest tightness, shortness of breath, wheezing and stridor.   Cardiovascular: Negative for chest pain, palpitations and leg swelling.  Gastrointestinal: Negative for nausea, vomiting, abdominal pain, diarrhea, constipation and blood in stool.  Genitourinary: Negative.   Musculoskeletal: Negative.  Negative for myalgias, back pain, joint swelling, arthralgias and gait problem.  Skin: Negative for color change, pallor, rash and wound.  Neurological: Negative for dizziness, tremors, speech difficulty, weakness and light-headedness.  Hematological: Negative for cold intolerance, heat intolerance and adenopathy. Does not bruise/bleed easily.  Psychiatric/Behavioral: Negative.        Objective:   Physical Exam  Vitals reviewed. Constitutional: She is oriented to person, place, and time. She appears well-developed and well-nourished. No distress.  HENT:  Head: Normocephalic and atraumatic.  Right Ear: Hearing, tympanic membrane, external ear and ear canal normal.  Left Ear: Hearing, tympanic membrane, external ear and  ear canal normal.  Nose: Mucosal edema and rhinorrhea present. No nose lacerations, sinus tenderness, nasal deformity, septal deviation or nasal septal hematoma. No epistaxis.  No foreign bodies. Right sinus exhibits no maxillary sinus tenderness and no frontal sinus tenderness. Left sinus exhibits no maxillary sinus tenderness and no frontal sinus tenderness.  Mouth/Throat: Oropharynx is clear and moist and mucous membranes are normal. Mucous membranes are not pale, not dry and not cyanotic. No oropharyngeal exudate, posterior oropharyngeal edema, posterior oropharyngeal erythema or tonsillar abscesses.  Eyes: Conjunctivae normal are normal. Right eye exhibits no discharge. Left eye exhibits no discharge. No scleral icterus.  Neck: Normal range of motion. Neck supple. No JVD present. No tracheal deviation present. No thyromegaly present.  Cardiovascular: Normal rate, regular rhythm, normal heart sounds and intact distal pulses.  Exam reveals no gallop and no friction rub.   No murmur heard. Pulmonary/Chest: Effort normal and breath sounds normal. No stridor. No respiratory distress. She has no wheezes. She has no rales. She exhibits no tenderness.  Abdominal: Soft. Bowel sounds are normal. She exhibits no distension and no mass. There is no tenderness. There is no rebound and no guarding.  Musculoskeletal: Normal range of motion. She exhibits no edema and no tenderness.  Lymphadenopathy:    She has no cervical adenopathy.  Neurological: She is oriented to person, place, and time.  Skin: Skin is warm and dry. No rash noted. She is not diaphoretic. No erythema. No pallor.  Psychiatric: She has a normal mood and affect. Her behavior is normal. Judgment and thought content normal.      Lab Results  Component Value Date   WBC 9.3 09/01/2011   HGB 12.5 09/01/2011   HCT 37.3 09/01/2011   PLT 338.0 09/01/2011   GLUCOSE 105* 01/24/2012   CHOL 154 01/24/2012   TRIG 114.0 01/24/2012  HDL 58.60 01/24/2012    LDLCALC 73 01/24/2012   ALT 19 01/24/2012   AST 23 01/24/2012   NA 137 01/24/2012   K 3.9 01/24/2012   CL 104 01/24/2012   CREATININE 0.8 01/24/2012   BUN 16 01/24/2012   CO2 27 01/24/2012   TSH 5.08 01/24/2012   INR 2.2 05/22/2012   HGBA1C 6.0 01/24/2012      Assessment & Plan:

## 2012-05-31 NOTE — Assessment & Plan Note (Signed)
She has adequate BP control 

## 2012-05-31 NOTE — Assessment & Plan Note (Signed)
I will check her TSH and will change her dose if needed

## 2012-05-31 NOTE — Assessment & Plan Note (Signed)
She will restart flonase

## 2012-05-31 NOTE — Assessment & Plan Note (Signed)
I will check her A1C and will address if needed I will also monitor her renal function

## 2012-06-04 ENCOUNTER — Other Ambulatory Visit: Payer: Self-pay | Admitting: Internal Medicine

## 2012-06-05 ENCOUNTER — Telehealth: Payer: Self-pay

## 2012-06-05 MED ORDER — WARFARIN SODIUM 5 MG PO TABS: 5.0000 mg | ORAL_TABLET | Freq: Every day | ORAL | Status: DC

## 2012-06-05 NOTE — Telephone Encounter (Signed)
Received refill request for warfarin 5mg   1 tab daily as directed by coumadin clinic.

## 2012-06-12 ENCOUNTER — Ambulatory Visit: Payer: Medicare Other | Admitting: Cardiology

## 2012-07-03 ENCOUNTER — Ambulatory Visit (INDEPENDENT_AMBULATORY_CARE_PROVIDER_SITE_OTHER): Payer: Medicare Other | Admitting: General Practice

## 2012-07-03 DIAGNOSIS — I4891 Unspecified atrial fibrillation: Secondary | ICD-10-CM | POA: Diagnosis not present

## 2012-08-13 ENCOUNTER — Other Ambulatory Visit: Payer: Self-pay | Admitting: Internal Medicine

## 2012-08-14 ENCOUNTER — Ambulatory Visit (INDEPENDENT_AMBULATORY_CARE_PROVIDER_SITE_OTHER): Payer: Medicare Other | Admitting: General Practice

## 2012-08-14 DIAGNOSIS — I4891 Unspecified atrial fibrillation: Secondary | ICD-10-CM | POA: Diagnosis not present

## 2012-08-14 LAB — POCT INR: INR: 2.3

## 2012-09-26 ENCOUNTER — Encounter: Payer: Self-pay | Admitting: Endocrinology

## 2012-09-26 ENCOUNTER — Ambulatory Visit (INDEPENDENT_AMBULATORY_CARE_PROVIDER_SITE_OTHER): Payer: Medicare Other | Admitting: Endocrinology

## 2012-09-26 VITALS — BP 136/74 | HR 70 | Ht 66.0 in | Wt 199.0 lb

## 2012-09-26 NOTE — Progress Notes (Signed)
Subjective:    Patient ID: [name] [name], female    DOB: 1941-04-20, 72 y.o.   MRN: [id number]  HPI pt returns for f/u of type 2 DM (dx'ed 2010; she has mild if any neuropathy of the lower extremities; she has no associated chronic complications of dm).  pt states she feels well in general.  no cbg record, but states cbg's are well-controlled. Past Medical History  Diagnosis Date  . Migraine, unspecified, without mention of intractable migraine without mention of status migrainosus   . HLD (hyperlipidemia)   . Aortic insufficiency   . Colon polyps   . Hypothyroidism   . GERD (gastroesophageal reflux disease)   . Osteoarthritis   . HTN (hypertension)   . Depression   . PAF (paroxysmal atrial fibrillation)     on coumadin and Cardiazem  . Asthma   . Allergic rhinitis   . Chronic anticoagulation     on coumadin  . Obesity   . Diastolic dysfunction   . S/P cardiac cath 2003    no obstructive disease     Past Surgical History  Procedure Laterality Date  . Appendectomy    . Abdominal hysterectomy    . Tubal ligation    . US echocardiography  2007    History   Social History  . Marital Status: Married    Spouse Name: N/A    Number of Children: 3  . Years of Education: N/A   Occupational History  . retired Char[employer] fundraiser    Social History Main Topics  . Smoking status: Never Smoker   . Smokeless tobacco: Never Used  . Alcohol Use: 3.0 oz/week    5 Glasses of wine per week  . Drug Use: No  . Sexually Active: Not Currently   Other Topics Concern  . Not on file   Social History Narrative  . No narrative on file    Current Outpatient Prescriptions on File Prior to Visit  Medication Sig Dispense Refill  . albuterol (PROAIR HFA) 108 (90 BASE) MCG/ACT inhaler Inhale 2 puffs into the lungs every 6 (six) hours as needed.  1 Inhaler  11  . BENICAR 40 MG tablet TAKE 1 TABLET BY MOUTH EVERY DAY  90 tablet  1  . budesonide-formoterol (SYMBICORT) 160-4.5 MCG/ACT inhaler  Inhale 2 puffs into the lungs 2 (two) times daily.  1 Inhaler  11  . calcium-vitamin D (OSCAL WITH D 500-200) 500-200 MG-UNIT per tablet Take 2 tablets by mouth daily.       Marland Kitchen esomeprazole (NEXIUM) 40 MG capsule TAKE 1 CAPSULE BY MOUTH TWICE A DAY  60 capsule  11  . fluticasone (FLONASE) 50 MCG/ACT nasal spray Place 2 sprays into the nose daily.  16 g  11  . Glucosamine-Chondroit-Vit C-Mn (GLUCOSAMINE 1500 COMPLEX PO) Take by mouth daily.       Marland Kitchen levothyroxine (SYNTHROID, LEVOTHROID) 25 MCG tablet TAKE 1 TABLET BY MOUTH EVERY DAY  90 tablet  0  . Loratadine (CLARITIN PO) Take by mouth.      . metFORMIN (GLUCOPHAGE-XR) 500 MG 24 hr tablet Take 1 tablet (500 mg total) by mouth daily with breakfast.  30 tablet  11  . mirabegron ER (MYRBETRIQ) 25 MG TB24 Take 1 tablet (25 mg total) by mouth daily.  30 tablet  11  . montelukast (SINGULAIR) 10 MG tablet TAKE 1 TABLET BY MOUTH EVERY DAY AT BEDTIME  90 tablet  3  . rosuvastatin (CRESTOR) 10 MG tablet Take 0.5 tablets (5 mg total)  by mouth daily.  30 tablet  11  . warfarin (COUMADIN) 5 MG tablet TAKE 1 TABLET (5 MG TOTAL) BY MOUTH DAILY. TAKE AS DIRECTED BY COUMADIN CLINIC.  140 tablet  0  . montelukast (SINGULAIR) 10 MG tablet Take 10 mg by mouth at bedtime.       No current facility-administered medications on file prior to visit.    Allergies  Allergen Reactions  . Cardizem (Diltiazem Hcl)     edema  . Acetaminophen   . Azithromycin   . Cefaclor   . Cephalexin   . Clindamycin   . Ibuprofen   . Levofloxacin   . Nsaids     Respiratory-Asthma  . Penicillins   . Pravastatin Sodium   . Simvastatin   . Sulfonamide Derivatives   . Tetracycline     Family History  Problem Relation Age of Onset  . Heart disease    . Hypertension    . COPD    . Crohn's disease    . Diabetes    . Depression    . Dementia     BP 136/74  Pulse 70  Ht 5\' 6"  (1.676 m)  Wt 199 lb (90.266 kg)  BMI 32.13 kg/m2  SpO2 98%  LMP 12/01/2010  Review of  Systems She has lost a few lbs, due to her efforts.  She denies nausea.    Objective:   Physical Exam VITAL SIGNS:  See vs page GENERAL: no distress  Lab Results  Component Value Date   HGBA1C 5.9 05/30/2012      Assessment & Plan:  DM: this is the best of the nine oral agents available for type 2 diabetes.  metformin gives the best risk-benefit ratio.

## 2012-09-26 NOTE — Patient Instructions (Addendum)
blood tests are being requested for you today.  We'll contact you with results. Please come back for a follow-up appointment in 6 months  

## 2012-09-28 ENCOUNTER — Ambulatory Visit (INDEPENDENT_AMBULATORY_CARE_PROVIDER_SITE_OTHER): Payer: Medicare Other | Admitting: Internal Medicine

## 2012-09-28 ENCOUNTER — Encounter: Payer: Self-pay | Admitting: Internal Medicine

## 2012-09-28 ENCOUNTER — Other Ambulatory Visit: Payer: Self-pay

## 2012-09-28 ENCOUNTER — Ambulatory Visit (INDEPENDENT_AMBULATORY_CARE_PROVIDER_SITE_OTHER): Payer: Medicare Other | Admitting: Family Medicine

## 2012-09-28 ENCOUNTER — Other Ambulatory Visit (INDEPENDENT_AMBULATORY_CARE_PROVIDER_SITE_OTHER): Payer: Medicare Other

## 2012-09-28 ENCOUNTER — Ambulatory Visit: Payer: Medicare Other

## 2012-09-28 VITALS — BP 132/78 | HR 70 | Temp 98.7°F | Resp 16 | Wt 201.2 lb

## 2012-09-28 DIAGNOSIS — E039 Hypothyroidism, unspecified: Secondary | ICD-10-CM

## 2012-09-28 DIAGNOSIS — I10 Essential (primary) hypertension: Secondary | ICD-10-CM

## 2012-09-28 DIAGNOSIS — I4891 Unspecified atrial fibrillation: Secondary | ICD-10-CM

## 2012-09-28 DIAGNOSIS — Z23 Encounter for immunization: Secondary | ICD-10-CM | POA: Diagnosis not present

## 2012-09-28 DIAGNOSIS — E119 Type 2 diabetes mellitus without complications: Secondary | ICD-10-CM

## 2012-09-28 DIAGNOSIS — J45909 Unspecified asthma, uncomplicated: Secondary | ICD-10-CM | POA: Diagnosis not present

## 2012-09-28 DIAGNOSIS — J4520 Mild intermittent asthma, uncomplicated: Secondary | ICD-10-CM

## 2012-09-28 LAB — BASIC METABOLIC PANEL
Calcium: 9 mg/dL (ref 8.4–10.5)
GFR: 91.43 mL/min (ref >60.00)
Glucose, Bld: 109 mg/dL — ABNORMAL HIGH (ref 70–99)
Potassium: 4.2 mEq/L (ref 3.5–5.1)
Sodium: 139 mEq/L (ref 135–145)

## 2012-09-28 LAB — MICROALBUMIN / CREATININE URINE RATIO
Creatinine,U: 30.2 mg/dL
Microalb Creat Ratio: 4 mg/g (ref 0.0–30.0)
Microalb, Ur: 1.2 mg/dL (ref 0.0–1.9)

## 2012-09-28 LAB — HEMOGLOBIN A1C
Hgb A1c MFr Bld: 5.8 % (ref 4.6–6.5)
Hgb A1c MFr Bld: 5.9 % (ref 4.6–6.5)

## 2012-09-28 NOTE — Patient Instructions (Signed)

## 2012-09-28 NOTE — Assessment & Plan Note (Signed)
I will recheck her TSH today and will adjust her dose if needed 

## 2012-09-28 NOTE — Assessment & Plan Note (Signed)
I will check her A1C and renal function and will make adjustments if needed

## 2012-09-28 NOTE — Assessment & Plan Note (Signed)
Cont symbicort  

## 2012-09-28 NOTE — Progress Notes (Signed)
  Subjective:    Patient ID: [name] [name], female    DOB: 1940-09-26, 72 y.o.   MRN: [id number]  Thyroid Problem Presents for follow-up visit. Symptoms include fatigue. Patient reports no anxiety, cold intolerance, constipation, depressed mood, diaphoresis, diarrhea, dry skin, hair loss, heat intolerance, hoarse voice, leg swelling, nail problem, palpitations, tremors, visual change, weight gain or weight loss. The symptoms have been stable.      Review of Systems  Constitutional: Positive for fatigue. Negative for fever, chills, weight loss, weight gain, diaphoresis, activity change, appetite change and unexpected weight change.  HENT: Negative.  Negative for hoarse voice.   Eyes: Negative.   Respiratory: Positive for shortness of breath (rarely) and wheezing (rarely). Negative for apnea, cough, choking, chest tightness and stridor.   Cardiovascular: Negative.  Negative for chest pain, palpitations and leg swelling.  Gastrointestinal: Negative.  Negative for nausea, vomiting, abdominal pain, diarrhea and constipation.  Endocrine: Negative.  Negative for cold intolerance and heat intolerance.  Genitourinary: Negative.   Musculoskeletal: Negative.  Negative for myalgias, back pain, joint swelling and gait problem.  Skin: Negative.   Allergic/Immunologic: Negative.   Neurological: Negative.  Negative for dizziness, tremors, weakness and light-headedness.  Hematological: Negative.  Negative for adenopathy. Does not bruise/bleed easily.  Psychiatric/Behavioral: Negative.        Objective:   Physical Exam  Vitals reviewed. Constitutional: She is oriented to person, place, and time. She appears well-developed and well-nourished. No distress.  HENT:  Head: Normocephalic and atraumatic.  Mouth/Throat: Oropharynx is clear and moist. No oropharyngeal exudate.  Eyes: Conjunctivae are normal. Right eye exhibits no discharge. Left eye exhibits no discharge. No scleral icterus.  Neck:  Normal range of motion. Neck supple. No JVD present. No tracheal deviation present. No thyromegaly present.  Cardiovascular: Normal rate, regular rhythm, normal heart sounds and intact distal pulses.  Exam reveals no gallop and no friction rub.   No murmur heard. Pulmonary/Chest: Effort normal. No accessory muscle usage or stridor. Not tachypneic. No respiratory distress. She has no decreased breath sounds. She has wheezes in the right lower field. She has no rhonchi. She has no rales. She exhibits no tenderness.  Abdominal: Soft. Bowel sounds are normal. She exhibits no distension and no mass. There is no tenderness. There is no rebound and no guarding.  Musculoskeletal: Normal range of motion. She exhibits no edema and no tenderness.  Lymphadenopathy:    She has no cervical adenopathy.  Neurological: She is oriented to person, place, and time.  Skin: Skin is warm and dry. No rash noted. She is not diaphoretic. No erythema. No pallor.  Psychiatric: She has a normal mood and affect. Her behavior is normal. Judgment and thought content normal.     Lab Results  Component Value Date   WBC 9.3 09/01/2011   HGB 12.5 09/01/2011   HCT 37.3 09/01/2011   PLT 338.0 09/01/2011   GLUCOSE 104* 05/30/2012   CHOL 154 01/24/2012   TRIG 114.0 01/24/2012   HDL 58.60 01/24/2012   LDLCALC 73 01/24/2012   ALT 19 05/30/2012   AST 20 05/30/2012   NA 139 05/30/2012   K 3.9 05/30/2012   CL 104 05/30/2012   CREATININE 0.7 05/30/2012   BUN 15 05/30/2012   CO2 29 05/30/2012   TSH 3.56 05/30/2012   INR 2.6 09/28/2012   HGBA1C 5.9 05/30/2012       Assessment & Plan:

## 2012-10-01 ENCOUNTER — Encounter: Payer: Self-pay | Admitting: Internal Medicine

## 2012-10-17 ENCOUNTER — Other Ambulatory Visit: Payer: Self-pay | Admitting: Internal Medicine

## 2012-10-25 ENCOUNTER — Other Ambulatory Visit: Payer: Self-pay | Admitting: *Deleted

## 2012-10-25 MED ORDER — METFORMIN HCL ER 500 MG PO TB24: 500.0000 mg | ORAL_TABLET | Freq: Every day | ORAL | Status: DC

## 2012-11-09 ENCOUNTER — Ambulatory Visit (INDEPENDENT_AMBULATORY_CARE_PROVIDER_SITE_OTHER): Payer: Medicare Other | Admitting: General Practice

## 2012-11-09 DIAGNOSIS — I4891 Unspecified atrial fibrillation: Secondary | ICD-10-CM

## 2012-11-10 ENCOUNTER — Other Ambulatory Visit: Payer: Self-pay | Admitting: Internal Medicine

## 2012-11-27 ENCOUNTER — Other Ambulatory Visit: Payer: Self-pay | Admitting: Internal Medicine

## 2012-11-27 DIAGNOSIS — Z1231 Encounter for screening mammogram for malignant neoplasm of breast: Secondary | ICD-10-CM

## 2012-11-29 ENCOUNTER — Ambulatory Visit (HOSPITAL_COMMUNITY)
Admission: RE | Admit: 2012-11-29 | Discharge: 2012-11-29 | Disposition: A | Discharge status: Home / Self Care | Payer: Medicare Other | Source: Ambulatory Visit | Attending: Internal Medicine | Admitting: Internal Medicine

## 2012-11-29 DIAGNOSIS — Z1231 Encounter for screening mammogram for malignant neoplasm of breast: Secondary | ICD-10-CM | POA: Diagnosis not present

## 2012-12-05 ENCOUNTER — Other Ambulatory Visit: Payer: Self-pay

## 2012-12-05 MED ORDER — METFORMIN HCL ER 500 MG PO TB24: 500.0000 mg | ORAL_TABLET | Freq: Every day | ORAL | Status: DC

## 2012-12-06 ENCOUNTER — Other Ambulatory Visit: Payer: Self-pay

## 2012-12-06 DIAGNOSIS — J309 Allergic rhinitis, unspecified: Secondary | ICD-10-CM

## 2012-12-06 MED ORDER — FLUTICASONE PROPIONATE 50 MCG/ACT NA SUSP: 2.0000 | Freq: Every day | NASAL | Status: DC

## 2012-12-07 ENCOUNTER — Ambulatory Visit (INDEPENDENT_AMBULATORY_CARE_PROVIDER_SITE_OTHER): Payer: Medicare Other | Admitting: General Practice

## 2012-12-07 DIAGNOSIS — I4891 Unspecified atrial fibrillation: Secondary | ICD-10-CM

## 2012-12-22 ENCOUNTER — Other Ambulatory Visit: Payer: Self-pay | Admitting: Endocrinology

## 2012-12-25 ENCOUNTER — Other Ambulatory Visit: Payer: Self-pay | Admitting: *Deleted

## 2012-12-25 MED ORDER — METFORMIN HCL ER 500 MG PO TB24: 500.0000 mg | ORAL_TABLET | Freq: Every day | ORAL | Status: DC

## 2013-01-11 ENCOUNTER — Ambulatory Visit (INDEPENDENT_AMBULATORY_CARE_PROVIDER_SITE_OTHER): Payer: Medicare Other | Admitting: General Practice

## 2013-01-11 DIAGNOSIS — I4891 Unspecified atrial fibrillation: Secondary | ICD-10-CM

## 2013-01-29 ENCOUNTER — Ambulatory Visit (INDEPENDENT_AMBULATORY_CARE_PROVIDER_SITE_OTHER): Payer: Medicare Other | Admitting: Internal Medicine

## 2013-01-29 ENCOUNTER — Other Ambulatory Visit (INDEPENDENT_AMBULATORY_CARE_PROVIDER_SITE_OTHER): Payer: Medicare Other

## 2013-01-29 ENCOUNTER — Encounter: Payer: Self-pay | Admitting: Internal Medicine

## 2013-01-29 VITALS — BP 132/84 | HR 78 | Temp 98.3°F | Resp 16 | Ht 66.0 in | Wt 201.0 lb

## 2013-01-29 DIAGNOSIS — E039 Hypothyroidism, unspecified: Secondary | ICD-10-CM

## 2013-01-29 DIAGNOSIS — I10 Essential (primary) hypertension: Secondary | ICD-10-CM

## 2013-01-29 DIAGNOSIS — IMO0001 Reserved for inherently not codable concepts without codable children: Secondary | ICD-10-CM

## 2013-01-29 DIAGNOSIS — E785 Hyperlipidemia, unspecified: Secondary | ICD-10-CM | POA: Diagnosis not present

## 2013-01-29 LAB — BASIC METABOLIC PANEL
BUN: 15 mg/dL (ref 6–23)
Calcium: 9 mg/dL (ref 8.4–10.5)
GFR: 77.8 mL/min (ref >60.00)
Glucose, Bld: 87 mg/dL (ref 70–99)

## 2013-01-29 LAB — LIPID PANEL
Cholesterol: 144 mg/dL (ref 0–200)
HDL: 63.5 mg/dL (ref >39.00)
VLDL: 12.2 mg/dL (ref 0.0–40.0)

## 2013-01-29 LAB — HEMOGLOBIN A1C: Hgb A1c MFr Bld: 6.2 % (ref 4.6–6.5)

## 2013-01-29 NOTE — Patient Instructions (Signed)

## 2013-01-29 NOTE — Progress Notes (Signed)
  Subjective:    Patient ID: [name] [name], female    DOB: [birth year], 72 y.o.   MRN: [id number]  Thyroid Problem Presents for follow-up visit. Symptoms include fatigue. Patient reports no anxiety, cold intolerance, constipation, depressed mood, diaphoresis, diarrhea, dry skin, hair loss, heat intolerance, hoarse voice, leg swelling, nail problem, palpitations, tremors, visual change, weight gain or weight loss. The symptoms have been stable. Past treatments include levothyroxine. The treatment provided moderate relief. Her past medical history is significant for atrial fibrillation and obesity.      Review of Systems  Constitutional: Positive for fatigue. Negative for fever, chills, weight loss, weight gain, diaphoresis, activity change, appetite change and unexpected weight change.  HENT: Positive for postnasal drip and rhinorrhea. Negative for congestion, facial swelling, hoarse voice, nosebleeds, sinus pressure, sneezing, sore throat, tinnitus and voice change.   Eyes: Negative.   Respiratory: Negative.  Negative for apnea, cough, choking, chest tightness, shortness of breath, wheezing and stridor.   Cardiovascular: Negative.  Negative for chest pain, palpitations and leg swelling.  Gastrointestinal: Negative.  Negative for nausea, vomiting, abdominal pain, diarrhea and constipation.  Endocrine: Negative.  Negative for cold intolerance and heat intolerance.  Genitourinary: Negative.   Musculoskeletal: Negative.   Allergic/Immunologic: Negative.   Neurological: Negative.  Negative for dizziness, tremors, weakness and light-headedness.  Hematological: Negative.  Negative for adenopathy. Does not bruise/bleed easily.  Psychiatric/Behavioral: Negative.        Objective:   Physical Exam  Vitals reviewed. Constitutional: She is oriented to person, place, and time. She appears well-developed and well-nourished. No distress.  HENT:  Head: Normocephalic and atraumatic.   Mouth/Throat: Oropharynx is clear and moist. No oropharyngeal exudate.  Eyes: Conjunctivae are normal. Right eye exhibits no discharge. Left eye exhibits no discharge. No scleral icterus.  Neck: Normal range of motion. Neck supple. No JVD present. No tracheal deviation present. No thyromegaly present.  Cardiovascular: Normal rate, regular rhythm, normal heart sounds and intact distal pulses.  Exam reveals no gallop and no friction rub.   No murmur heard. Pulmonary/Chest: Effort normal and breath sounds normal. No stridor. No respiratory distress. She has no wheezes. She has no rales. She exhibits no tenderness.  Abdominal: Soft. Bowel sounds are normal. She exhibits no distension and no mass. There is no tenderness. There is no rebound and no guarding.  Musculoskeletal: Normal range of motion. She exhibits no edema and no tenderness.  Lymphadenopathy:    She has no cervical adenopathy.  Neurological: She is oriented to person, place, and time.  Skin: Skin is warm and dry. No rash noted. She is not diaphoretic. No erythema. No pallor.  Psychiatric: She has a normal mood and affect. Her behavior is normal. Judgment and thought content normal.     Lab Results  Component Value Date   WBC 9.3 09/01/2011   HGB 12.5 09/01/2011   HCT 37.3 09/01/2011   PLT 338.0 09/01/2011   GLUCOSE 109* 09/28/2012   CHOL 154 01/24/2012   TRIG 114.0 01/24/2012   HDL 58.60 01/24/2012   LDLCALC 73 01/24/2012   ALT 19 05/30/2012   AST 20 05/30/2012   NA 139 09/28/2012   K 4.2 09/28/2012   CL 105 09/28/2012   CREATININE 0.7 09/28/2012   BUN 17 09/28/2012   CO2 29 09/28/2012   TSH 2.53 09/28/2012   INR 1.7 01/11/2013   HGBA1C 5.9 09/28/2012   MICROALBUR 1.2 09/28/2012       Assessment & Plan:

## 2013-02-01 ENCOUNTER — Encounter: Payer: Self-pay | Admitting: Internal Medicine

## 2013-02-01 NOTE — Assessment & Plan Note (Signed)
Her BP is well controlled I will check her lytes and renal function 

## 2013-02-01 NOTE — Assessment & Plan Note (Signed)
Repeat FLP today.

## 2013-02-01 NOTE — Assessment & Plan Note (Signed)
Her blood sugars are well controlled 

## 2013-02-01 NOTE — Assessment & Plan Note (Signed)
TSh is in the normal range, no dose adjustment needed

## 2013-02-08 ENCOUNTER — Ambulatory Visit (INDEPENDENT_AMBULATORY_CARE_PROVIDER_SITE_OTHER): Payer: Medicare Other | Admitting: General Practice

## 2013-02-08 DIAGNOSIS — I4891 Unspecified atrial fibrillation: Secondary | ICD-10-CM

## 2013-02-13 ENCOUNTER — Other Ambulatory Visit: Payer: Self-pay | Admitting: Internal Medicine

## 2013-02-19 DIAGNOSIS — Z23 Encounter for immunization: Secondary | ICD-10-CM | POA: Diagnosis not present

## 2013-02-28 DIAGNOSIS — H2510 Age-related nuclear cataract, unspecified eye: Secondary | ICD-10-CM | POA: Diagnosis not present

## 2013-03-07 ENCOUNTER — Other Ambulatory Visit: Payer: Self-pay | Admitting: Internal Medicine

## 2013-03-08 ENCOUNTER — Ambulatory Visit (INDEPENDENT_AMBULATORY_CARE_PROVIDER_SITE_OTHER): Payer: Medicare Other | Admitting: General Practice

## 2013-03-08 DIAGNOSIS — I4891 Unspecified atrial fibrillation: Secondary | ICD-10-CM

## 2013-03-08 LAB — POCT INR: INR: 2.7

## 2013-03-08 NOTE — Progress Notes (Signed)
Pre-visit discussion using our clinic review tool. No additional management support is needed unless otherwise documented below in the visit note.  

## 2013-03-25 ENCOUNTER — Other Ambulatory Visit: Payer: Self-pay | Admitting: Internal Medicine

## 2013-04-03 ENCOUNTER — Ambulatory Visit (INDEPENDENT_AMBULATORY_CARE_PROVIDER_SITE_OTHER): Payer: Medicare Other | Admitting: Endocrinology

## 2013-04-03 ENCOUNTER — Encounter: Payer: Self-pay | Admitting: Endocrinology

## 2013-04-03 VITALS — BP 122/70 | HR 88 | Temp 97.8°F | Ht 66.0 in | Wt 206.0 lb

## 2013-04-03 NOTE — Patient Instructions (Addendum)
Please continue the same metformin. I would be happy to see you back here whenever you want.

## 2013-04-03 NOTE — Progress Notes (Signed)
Subjective:    Patient ID: [name] [name], female    DOB: Jan 19, 1941, 72 y.o.   MRN: [id number]  HPI pt returns for f/u of type 2 DM (dx'ed 2010, on a routine blood test; she has mild if any neuropathy of the lower extremities; she has no associated chronic complications of dm; she has never been on insulin).  pt states she feels well in general.  no cbg record, but states cbg's are well-controlled. Past Medical History  Diagnosis Date  . Migraine, unspecified, without mention of intractable migraine without mention of status migrainosus   . HLD (hyperlipidemia)   . Aortic insufficiency   . Colon polyps   . Hypothyroidism   . GERD (gastroesophageal reflux disease)   . Osteoarthritis   . HTN (hypertension)   . Depression   . PAF (paroxysmal atrial fibrillation)     on coumadin and Cardiazem  . Asthma   . Allergic rhinitis   . Chronic anticoagulation     on coumadin  . Obesity   . Diastolic dysfunction   . S/P cardiac cath 2003    no obstructive disease     Past Surgical History  Procedure Laterality Date  . Appendectomy    . Abdominal hysterectomy    . Tubal ligation    . US echocardiography  2007    History   Social History  . Marital Status: Married    Spouse Name: N/A    Number of Children: 3  . Years of Education: N/A   Occupational History  . retired [employer] fundraiser    Social History Main Topics  . Smoking status: Never Smoker   . Smokeless tobacco: Never Used  . Alcohol Use: 3.0 oz/week    5 Glasses of wine per week  . Drug Use: No  . Sexual Activity: Not Currently   Other Topics Concern  . Not on file   Social History Narrative  . No narrative on file    Current Outpatient Prescriptions on File Prior to Visit  Medication Sig Dispense Refill  . BENICAR 40 MG tablet TAKE 1 TABLET BY MOUTH EVERY DAY  90 tablet  3  . calcium-vitamin D (OSCAL WITH D 500-200) 500-200 MG-UNIT per tablet Take 2 tablets by mouth daily.       . CRESTOR 10 MG tablet TAKE 1/2  TABLET ONCE DAILY  30 tablet  5  . esomeprazole (NEXIUM) 40 MG capsule TAKE 1 CAPSULE BY MOUTH TWICE A DAY  60 capsule  11  . fluticasone (FLONASE) 50 MCG/ACT nasal spray Place 2 sprays into the nose daily.  48 g  4  . Glucosamine-Chondroit-Vit C-Mn (GLUCOSAMINE 1500 COMPLEX PO) Take by mouth daily.       Marland Kitchen levothyroxine (SYNTHROID, LEVOTHROID) 25 MCG tablet TAKE 1 TABLET BY MOUTH EVERY DAY  90 tablet  1  . Loratadine (CLARITIN PO) Take by mouth.      . metFORMIN (GLUCOPHAGE-XR) 500 MG 24 hr tablet Take 1 tablet (500 mg total) by mouth daily with breakfast.  90 tablet  1  . montelukast (SINGULAIR) 10 MG tablet TAKE 1 TABLET BY MOUTH EVERY DAY AT BEDTIME  90 tablet  3  . MYRBETRIQ 25 MG TB24 tablet TAKE 1 TABLET EVERY DAY  30 tablet  9  . PROAIR HFA 108 (90 BASE) MCG/ACT inhaler INHALE 2 PUFFS EVERY 6 HOURS AS NEEDED  8.5 each  11  . SYMBICORT 160-4.5 MCG/ACT inhaler INHALE 2 PUFFS TWICE DAILY  10.2 g  11  .  warfarin (COUMADIN) 5 MG tablet TAKE 1 TABLET BY MOUTH ONCE DAILY OR AS DIRECTED BY COUMADIN CLINIC  140 tablet  0   No current facility-administered medications on file prior to visit.    Allergies  Allergen Reactions  . Cardizem [Diltiazem Hcl]     edema  . Acetaminophen   . Azithromycin   . Cefaclor   . Cephalexin   . Clindamycin   . Ibuprofen   . Levofloxacin   . Nsaids     Respiratory-Asthma  . Penicillins   . Pravastatin Sodium   . Simvastatin   . Sulfonamide Derivatives   . Tetracycline     Family History  Problem Relation Age of Onset  . Heart disease    . Hypertension    . COPD    . Crohn's disease    . Diabetes    . Depression    . Dementia      BP 122/70  Pulse 88  Temp(Src) 97.8 F (36.6 C) (Oral)  Ht 5\' 6"  (1.676 m)  Wt 206 lb (93.441 kg)  BMI 33.27 kg/m2  SpO2 97%  LMP 12/01/2010  Review of Systems denies hypoglycemia.  She has lost a few lbs.      Objective:   Physical Exam VITAL SIGNS:  See vs page GENERAL: no distress  Lab Results    Component Value Date   HGBA1C 6.2 01/29/2013      Assessment & Plan:  DM: well-controlled AF: in this context, i agree with the medication that does not cause hypoglycemia.

## 2013-04-05 ENCOUNTER — Ambulatory Visit (INDEPENDENT_AMBULATORY_CARE_PROVIDER_SITE_OTHER): Payer: Medicare Other | Admitting: General Practice

## 2013-04-05 DIAGNOSIS — I4891 Unspecified atrial fibrillation: Secondary | ICD-10-CM

## 2013-04-05 LAB — POCT INR: INR: 2.1

## 2013-04-05 NOTE — Progress Notes (Signed)
Pre-visit discussion using our clinic review tool. No additional management support is needed unless otherwise documented below in the visit note.  

## 2013-04-10 ENCOUNTER — Encounter: Payer: Self-pay | Admitting: Internal Medicine

## 2013-04-10 ENCOUNTER — Ambulatory Visit (INDEPENDENT_AMBULATORY_CARE_PROVIDER_SITE_OTHER): Payer: Medicare Other | Admitting: Internal Medicine

## 2013-04-10 VITALS — BP 140/96 | HR 79 | Temp 98.1°F | Resp 16 | Ht 66.0 in | Wt 202.0 lb

## 2013-04-10 DIAGNOSIS — J45901 Unspecified asthma with (acute) exacerbation: Secondary | ICD-10-CM | POA: Diagnosis not present

## 2013-04-10 DIAGNOSIS — J45909 Unspecified asthma, uncomplicated: Secondary | ICD-10-CM | POA: Diagnosis not present

## 2013-04-10 DIAGNOSIS — J209 Acute bronchitis, unspecified: Secondary | ICD-10-CM

## 2013-04-10 DIAGNOSIS — J4520 Mild intermittent asthma, uncomplicated: Secondary | ICD-10-CM

## 2013-04-10 MED ORDER — METHYLPREDNISOLONE ACETATE 80 MG/ML IJ SUSP
120.0000 mg | Freq: Once | INTRAMUSCULAR | Status: AC
  Administered 2013-04-10: 120 mg via INTRAMUSCULAR

## 2013-04-10 MED ORDER — HYDROCODONE-HOMATROPINE 5-1.5 MG/5ML PO SYRP: 5.0000 mL | ORAL_SOLUTION | Freq: Three times a day (TID) | ORAL | Status: DC | PRN

## 2013-04-10 MED ORDER — MOXIFLOXACIN HCL 400 MG PO TABS: 400.0000 mg | ORAL_TABLET | Freq: Every day | ORAL | Status: DC

## 2013-04-10 NOTE — Progress Notes (Signed)
Pre visit review using our clinic review tool, if applicable. No additional management support is needed unless otherwise documented below in the visit note. 

## 2013-04-10 NOTE — Progress Notes (Signed)
Subjective:    Patient ID: [name] [name], female    DOB: Dec 25, 1940, 72 y.o.   MRN: [id number]  Cough This is a recurrent problem. The current episode started 1 to 4 weeks ago. The problem has been gradually worsening. The problem occurs every few hours. The cough is productive of purulent sputum. Associated symptoms include sweats and wheezing. Pertinent negatives include no chest pain, chills, ear congestion, ear pain, fever, headaches, heartburn, hemoptysis, myalgias, nasal congestion, postnasal drip, rash, rhinorrhea, sore throat or shortness of breath. She has tried a beta-agonist inhaler and steroid inhaler for the symptoms. The treatment provided moderate relief. Her past medical history is significant for asthma and environmental allergies. There is no history of bronchiectasis, bronchitis, COPD, emphysema or pneumonia.      Review of Systems  Constitutional: Positive for fatigue. Negative for fever, chills, activity change, appetite change and unexpected weight change.  HENT: Positive for sinus pressure. Negative for congestion, ear pain, facial swelling, nosebleeds, postnasal drip, rhinorrhea, sore throat, tinnitus, trouble swallowing and voice change.   Eyes: Negative.   Respiratory: Positive for cough and wheezing. Negative for hemoptysis and shortness of breath.   Cardiovascular: Negative.  Negative for chest pain, palpitations and leg swelling.  Gastrointestinal: Negative.  Negative for heartburn.  Endocrine: Negative.   Genitourinary: Negative.   Musculoskeletal: Negative.  Negative for myalgias.  Skin: Negative.  Negative for rash.  Allergic/Immunologic: Positive for environmental allergies.  Neurological: Negative.  Negative for headaches.  Hematological: Negative.  Negative for adenopathy. Does not bruise/bleed easily.  Psychiatric/Behavioral: Negative.        Objective:   Physical Exam  Vitals reviewed. Constitutional: She is oriented to person, place, and  time. She appears well-developed and well-nourished.  Non-toxic appearance. She does not have a sickly appearance. She does not appear ill. No distress.  HENT:  Head: Normocephalic and atraumatic.  Right Ear: Tympanic membrane normal.  Left Ear: Tympanic membrane normal.  Nose: No mucosal edema or rhinorrhea. Right sinus exhibits no maxillary sinus tenderness and no frontal sinus tenderness. Left sinus exhibits no maxillary sinus tenderness and no frontal sinus tenderness.  Mouth/Throat: Oropharynx is clear and moist and mucous membranes are normal. Mucous membranes are not pale, not dry and not cyanotic. No oral lesions. No trismus in the jaw. No oropharyngeal exudate, posterior oropharyngeal edema or posterior oropharyngeal erythema.  Eyes: Conjunctivae are normal. Right eye exhibits no discharge. Left eye exhibits no discharge. No scleral icterus.  Neck: Normal range of motion. Neck supple. No JVD present. No tracheal deviation present. No thyromegaly present.  Cardiovascular: Normal rate, regular rhythm, normal heart sounds and intact distal pulses.  Exam reveals no gallop and no friction rub.   No murmur heard. Pulmonary/Chest: Effort normal. No accessory muscle usage or stridor. Not tachypneic. No respiratory distress. She has no decreased breath sounds. She has wheezes in the right middle field and the left middle field. She has no rhonchi. She has no rales. She exhibits no tenderness.  Abdominal: Soft. Bowel sounds are normal. She exhibits no distension and no mass. There is no tenderness. There is no rebound and no guarding.  Musculoskeletal: Normal range of motion. She exhibits no edema and no tenderness.  Lymphadenopathy:    She has no cervical adenopathy.  Neurological: She is oriented to person, place, and time.  Skin: Skin is warm and dry. No rash noted. She is not diaphoretic. No erythema. No pallor.     Lab Results  Component Value Date  WBC 9.3 09/01/2011   HGB 12.5 09/01/2011     HCT 37.3 09/01/2011   PLT 338.0 09/01/2011   GLUCOSE 87 01/29/2013   CHOL 144 01/29/2013   TRIG 61.0 01/29/2013   HDL 63.50 01/29/2013   LDLCALC 68 01/29/2013   ALT 19 05/30/2012   AST 20 05/30/2012   NA 139 01/29/2013   K 3.9 01/29/2013   CL 103 01/29/2013   CREATININE 0.8 01/29/2013   BUN 15 01/29/2013   CO2 31 01/29/2013   TSH 4.34 01/29/2013   INR 2.1 04/05/2013   HGBA1C 6.2 01/29/2013   MICROALBUR 1.2 09/28/2012       Assessment & Plan:

## 2013-04-10 NOTE — Patient Instructions (Signed)
Acute Bronchitis Bronchitis is inflammation of the airways that extend from the windpipe into the lungs (bronchi). The inflammation often causes mucus to develop. This leads to a cough, which is the most common symptom of bronchitis.  In acute bronchitis, the condition usually develops suddenly and goes away over time, usually in a couple weeks. Smoking, allergies, and asthma can make bronchitis worse. Repeated episodes of bronchitis may cause further lung problems.  CAUSES Acute bronchitis is most often caused by the same virus that causes a cold. The virus can spread from person to person (contagious).  SIGNS AND SYMPTOMS   Cough.   Fever.   Coughing up mucus.   Body aches.   Chest congestion.   Chills.   Shortness of breath.   Sore throat.  DIAGNOSIS  Acute bronchitis is usually diagnosed through a physical exam. Tests, such as chest X-rays, are sometimes done to rule out other conditions.  TREATMENT  Acute bronchitis usually goes away in a couple weeks. Often times, no medical treatment is necessary. Medicines are sometimes given for relief of fever or cough. Antibiotics are usually not needed but may be prescribed in certain situations. In some cases, an inhaler may be recommended to help reduce shortness of breath and control the cough. A cool mist vaporizer may also be used to help thin bronchial secretions and make it easier to clear the chest.  HOME CARE INSTRUCTIONS  Get plenty of rest.   Drink enough fluids to keep your urine clear or pale yellow (unless you have a medical condition that requires fluid restriction). Increasing fluids may help thin your secretions and will prevent dehydration.   Only take over-the-counter or prescription medicines as directed by your health care provider.   Avoid smoking and secondhand smoke. Exposure to cigarette smoke or irritating chemicals will make bronchitis worse. If you are a smoker, consider using nicotine gum or skin  patches to help control withdrawal symptoms. Quitting smoking will help your lungs heal faster.   Reduce the chances of another bout of acute bronchitis by washing your hands frequently, avoiding people with cold symptoms, and trying not to touch your hands to your mouth, nose, or eyes.   Follow up with your health care provider as directed.  SEEK MEDICAL CARE IF: Your symptoms do not improve after 1 week of treatment.  SEEK IMMEDIATE MEDICAL CARE IF:  You develop an increased fever or chills.   You have chest pain.   You have severe shortness of breath.  You have bloody sputum.   You develop dehydration.  You develop fainting.  You develop repeated vomiting.  You develop a severe headache. MAKE SURE YOU:   Understand these instructions.  Will watch your condition.  Will get help right away if you are not doing well or get worse. Document Released: 05/19/2004 Document Revised: 12/12/2012 Document Reviewed: 10/02/2012 [employer] Patient Information 2014 [employer].  

## 2013-04-11 ENCOUNTER — Telehealth: Payer: Self-pay | Admitting: *Deleted

## 2013-04-11 NOTE — Telephone Encounter (Signed)
Pt called states as per the pharmacy there is an interaction between the Avelox and Coumadin.  Please advise alternative.

## 2013-04-11 NOTE — Telephone Encounter (Signed)
Spoke with pt advised of MDs message 

## 2013-04-11 NOTE — Telephone Encounter (Signed)
She can come in for coumadin check if she is concerned about that

## 2013-04-12 NOTE — Assessment & Plan Note (Signed)
She is having a flare of her symptoms so I gave her an injection of depo-medrol IM 

## 2013-04-12 NOTE — Assessment & Plan Note (Signed)
Steroids today

## 2013-04-12 NOTE — Assessment & Plan Note (Addendum)
I will treat the infection with avelox and will control the cough with hycodan 

## 2013-05-01 ENCOUNTER — Encounter: Payer: Self-pay | Admitting: Internal Medicine

## 2013-05-01 ENCOUNTER — Other Ambulatory Visit (INDEPENDENT_AMBULATORY_CARE_PROVIDER_SITE_OTHER): Payer: Medicare Other

## 2013-05-01 ENCOUNTER — Ambulatory Visit (INDEPENDENT_AMBULATORY_CARE_PROVIDER_SITE_OTHER): Payer: Medicare Other | Admitting: Internal Medicine

## 2013-05-01 VITALS — BP 150/70 | HR 80 | Temp 98.1°F | Resp 16 | Wt 204.0 lb

## 2013-05-01 DIAGNOSIS — J45909 Unspecified asthma, uncomplicated: Secondary | ICD-10-CM | POA: Diagnosis not present

## 2013-05-01 DIAGNOSIS — I10 Essential (primary) hypertension: Secondary | ICD-10-CM

## 2013-05-01 DIAGNOSIS — IMO0001 Reserved for inherently not codable concepts without codable children: Secondary | ICD-10-CM

## 2013-05-01 DIAGNOSIS — I4891 Unspecified atrial fibrillation: Secondary | ICD-10-CM

## 2013-05-01 DIAGNOSIS — J209 Acute bronchitis, unspecified: Secondary | ICD-10-CM

## 2013-05-01 DIAGNOSIS — E1165 Type 2 diabetes mellitus with hyperglycemia: Secondary | ICD-10-CM

## 2013-05-01 DIAGNOSIS — J4520 Mild intermittent asthma, uncomplicated: Secondary | ICD-10-CM

## 2013-05-01 DIAGNOSIS — E039 Hypothyroidism, unspecified: Secondary | ICD-10-CM

## 2013-05-01 DIAGNOSIS — E119 Type 2 diabetes mellitus without complications: Secondary | ICD-10-CM

## 2013-05-01 LAB — CBC WITH DIFFERENTIAL/PLATELET
BASOS PCT: 0.4 % (ref 0.0–3.0)
Basophils Absolute: 0 10*3/uL (ref 0.0–0.1)
EOS PCT: 4.4 % (ref 0.0–5.0)
Eosinophils Absolute: 0.4 10*3/uL (ref 0.0–0.7)
HCT: 39 % (ref 36.0–46.0)
HEMOGLOBIN: 13.3 g/dL (ref 12.0–15.0)
Lymphocytes Relative: 19.7 % (ref 12.0–46.0)
Lymphs Abs: 1.9 10*3/uL (ref 0.7–4.0)
MCHC: 34 g/dL (ref 30.0–36.0)
MCV: 89.2 fl (ref 78.0–100.0)
MONOS PCT: 6.4 % (ref 3.0–12.0)
Monocytes Absolute: 0.6 10*3/uL (ref 0.1–1.0)
NEUTROS ABS: 6.7 10*3/uL (ref 1.4–7.7)
NEUTROS PCT: 69.1 % (ref 43.0–77.0)
Platelets: 319 10*3/uL (ref 150.0–400.0)
RBC: 4.37 Mil/uL (ref 3.87–5.11)
RDW: 14.9 % — ABNORMAL HIGH (ref 11.5–14.6)
WBC: 9.7 10*3/uL (ref 4.5–10.5)

## 2013-05-01 LAB — TSH: TSH: 4.21 u[IU]/mL (ref 0.35–5.50)

## 2013-05-01 LAB — HEMOGLOBIN A1C: Hgb A1c MFr Bld: 6.1 % (ref 4.6–6.5)

## 2013-05-01 LAB — BASIC METABOLIC PANEL
BUN: 14 mg/dL (ref 6–23)
CO2: 30 meq/L (ref 19–32)
CREATININE: 0.8 mg/dL (ref 0.4–1.2)
Calcium: 9.1 mg/dL (ref 8.4–10.5)
Chloride: 104 mEq/L (ref 96–112)
GFR: 75.48 mL/min (ref >60.00)
GLUCOSE: 94 mg/dL (ref 70–99)
Potassium: 4 mEq/L (ref 3.5–5.1)
Sodium: 140 mEq/L (ref 135–145)

## 2013-05-01 MED ORDER — HYDROCODONE-HOMATROPINE 5-1.5 MG/5ML PO SYRP: 5.0000 mL | ORAL_SOLUTION | Freq: Three times a day (TID) | ORAL | Status: DC | PRN

## 2013-05-01 NOTE — Progress Notes (Signed)
Subjective:    Patient ID: [name] [name], female    DOB: Jun 04, 1940, 73 y.o.   MRN: [id number]  Asthma She complains of wheezing. There is no chest tightness, cough, difficulty breathing, frequent throat clearing, hemoptysis, hoarse voice, shortness of breath or sputum production. This is a chronic problem. The current episode started more than 1 year ago. The problem occurs intermittently. The problem has been gradually improving. Associated symptoms include rhinorrhea. Pertinent negatives include no appetite change, chest pain, dyspnea on exertion, ear congestion, ear pain, fever, headaches, heartburn, malaise/fatigue, myalgias, nasal congestion, orthopnea, PND, postnasal drip, sneezing, sore throat, sweats, trouble swallowing or weight loss. Her symptoms are alleviated by steroid inhaler and beta-agonist. She reports significant improvement on treatment. Her past medical history is significant for asthma.      Review of Systems  Constitutional: Negative.  Negative for fever, chills, weight loss, malaise/fatigue, diaphoresis, appetite change and fatigue.  HENT: Positive for rhinorrhea. Negative for congestion, ear pain, facial swelling, hoarse voice, nosebleeds, postnasal drip, sinus pressure, sneezing, sore throat, tinnitus, trouble swallowing and voice change.   Eyes: Negative.   Respiratory: Positive for wheezing. Negative for apnea, cough, hemoptysis, sputum production, choking, chest tightness, shortness of breath and stridor.   Cardiovascular: Negative.  Negative for chest pain, dyspnea on exertion, palpitations, leg swelling and PND.  Gastrointestinal: Negative.  Negative for heartburn, nausea, vomiting, abdominal pain, diarrhea, constipation and blood in stool.  Endocrine: Negative.   Genitourinary: Negative.   Musculoskeletal: Negative.  Negative for myalgias.  Skin: Negative.   Allergic/Immunologic: Negative.   Neurological: Negative.  Negative for headaches.    Hematological: Negative.  Negative for adenopathy. Does not bruise/bleed easily.  Psychiatric/Behavioral: Negative.        Objective:   Physical Exam  Vitals reviewed. Constitutional: She is oriented to person, place, and time. She appears well-developed and well-nourished. No distress.  HENT:  Head: Normocephalic and atraumatic.  Mouth/Throat: Oropharynx is clear and moist. No oropharyngeal exudate.  Eyes: Conjunctivae are normal. Right eye exhibits no discharge. Left eye exhibits no discharge. No scleral icterus.  Neck: Normal range of motion. Neck supple. No JVD present. No tracheal deviation present. No thyromegaly present.  Cardiovascular: Normal rate, regular rhythm, normal heart sounds and intact distal pulses.  Exam reveals no gallop and no friction rub.   No murmur heard. Pulmonary/Chest: No accessory muscle usage or stridor. Not tachypneic. No respiratory distress. She has no decreased breath sounds. She has wheezes in the right upper field and the left upper field. She has no rhonchi. She has no rales. She exhibits no tenderness.  Abdominal: Soft. Bowel sounds are normal. She exhibits no distension and no mass. There is no tenderness. There is no rebound and no guarding.  Musculoskeletal: Normal range of motion. She exhibits no edema and no tenderness.  Lymphadenopathy:    She has no cervical adenopathy.  Neurological: She is oriented to person, place, and time.  Skin: Skin is warm and dry. No rash noted. She is not diaphoretic. No erythema. No pallor.     Lab Results  Component Value Date   WBC 9.3 09/01/2011   HGB 12.5 09/01/2011   HCT 37.3 09/01/2011   PLT 338.0 09/01/2011   GLUCOSE 87 01/29/2013   CHOL 144 01/29/2013   TRIG 61.0 01/29/2013   HDL 63.50 01/29/2013   LDLCALC 68 01/29/2013   ALT 19 05/30/2012   AST 20 05/30/2012   NA 139 01/29/2013   K 3.9 01/29/2013   CL 103  01/29/2013   CREATININE 0.8 01/29/2013   BUN 15 01/29/2013   CO2 31 01/29/2013   TSH 4.34 01/29/2013   INR  2.1 04/05/2013   HGBA1C 6.2 01/29/2013   MICROALBUR 1.2 09/28/2012       Assessment & Plan:

## 2013-05-01 NOTE — Patient Instructions (Signed)
Type 2 Diabetes Mellitus, Adult Type 2 diabetes mellitus, often simply referred to as type 2 diabetes, is a long-lasting (chronic) disease. In type 2 diabetes, the pancreas does not make enough insulin (a hormone), the cells are less responsive to the insulin that is made (insulin resistance), or both. Normally, insulin moves sugars from food into the tissue cells. The tissue cells use the sugars for energy. The lack of insulin or the lack of normal response to insulin causes excess sugars to build up in the blood instead of going into the tissue cells. As a result, high blood sugar (hyperglycemia) develops. The effect of high sugar (glucose) levels can cause many complications. Type 2 diabetes was also previously called adult-onset diabetes but it can occur at any age.  RISK FACTORS  A person is predisposed to developing type 2 diabetes if someone in the family has the disease and also has one or more of the following primary risk factors:  Overweight.  An inactive lifestyle.  A history of consistently eating high-calorie foods. Maintaining a normal weight and regular physical activity can reduce the chance of developing type 2 diabetes. SYMPTOMS  A person with type 2 diabetes may not show symptoms initially. The symptoms of type 2 diabetes appear slowly. The symptoms include:  Increased thirst (polydipsia).  Increased urination (polyuria).  Increased urination during the night (nocturia).  Weight loss. This weight loss may be rapid.  Frequent, recurring infections.  Tiredness (fatigue).  Weakness.  Vision changes, such as blurred vision.  Fruity smell to your breath.  Abdominal pain.  Nausea or vomiting.  Cuts or bruises which are slow to heal.  Tingling or numbness in the hands or feet. DIAGNOSIS Type 2 diabetes is frequently not diagnosed until complications of diabetes are present. Type 2 diabetes is diagnosed when symptoms or complications are present and when blood  glucose levels are increased. Your blood glucose level may be checked by one or more of the following blood tests:  A fasting blood glucose test. You will not be allowed to eat for at least 8 hours before a blood sample is taken.  A random blood glucose test. Your blood glucose is checked at any time of the day regardless of when you ate.  A hemoglobin A1c blood glucose test. A hemoglobin A1c test provides information about blood glucose control over the previous 3 months.  An oral glucose tolerance test (OGTT). Your blood glucose is measured after you have not eaten (fasted) for 2 hours and then after you drink a glucose-containing beverage. TREATMENT   You may need to take insulin or diabetes medicine daily to keep blood glucose levels in the desired range.  You will need to match insulin dosing with exercise and healthy food choices. The treatment goal is to maintain the before meal blood sugar (preprandial glucose) level at 70 130 mg/dL. HOME CARE INSTRUCTIONS   Have your hemoglobin A1c level checked twice a year.  Perform daily blood glucose monitoring as directed by your caregiver.  Monitor urine ketones when you are ill and as directed by your caregiver.  Take your diabetes medicine or insulin as directed by your caregiver to maintain your blood glucose levels in the desired range.  Never run out of diabetes medicine or insulin. It is needed every day.  Adjust insulin based on your intake of carbohydrates. Carbohydrates can raise blood glucose levels but need to be included in your diet. Carbohydrates provide vitamins, minerals, and fiber which are an essential part of   a healthy diet. Carbohydrates are found in fruits, vegetables, whole grains, dairy products, legumes, and foods containing added sugars.    Eat healthy foods. Alternate 3 meals with 3 snacks.  Lose weight if overweight.  Carry a medical alert card or wear your medical alert jewelry.  Carry a 15 gram  carbohydrate snack with you at all times to treat low blood glucose (hypoglycemia). Some examples of 15 gram carbohydrate snacks include:  Glucose tablets, 3 or 4   Glucose gel, 15 gram tube  Raisins, 2 tablespoons (24 grams)  Jelly beans, 6  Animal crackers, 8  Regular pop, 4 ounces (120 mL)  Gummy treats, 9  Recognize hypoglycemia. Hypoglycemia occurs with blood glucose levels of 70 mg/dL and below. The risk for hypoglycemia increases when fasting or skipping meals, during or after intense exercise, and during sleep. Hypoglycemia symptoms can include:  Tremors or shakes.  Decreased ability to concentrate.  Sweating.  Increased heart rate.  Headache.  Dry mouth.  Hunger.  Irritability.  Anxiety.  Restless sleep.  Altered speech or coordination.  Confusion.  Treat hypoglycemia promptly. If you are alert and able to safely swallow, follow the 15:15 rule:  Take 15 20 grams of rapid-acting glucose or carbohydrate. Rapid-acting options include glucose gel, glucose tablets, or 4 ounces (120 mL) of fruit juice, regular soda, or low fat milk.  Check your blood glucose level 15 minutes after taking the glucose.  Take 15 20 grams more of glucose if the repeat blood glucose level is still 70 mg/dL or below.  Eat a meal or snack within 1 hour once blood glucose levels return to normal.    Be alert to polyuria and polydipsia which are early signs of hyperglycemia. An early awareness of hyperglycemia allows for prompt treatment. Treat hyperglycemia as directed by your caregiver.  Engage in at least 150 minutes of moderate-intensity physical activity a week, spread over at least 3 days of the week or as directed by your caregiver. In addition, you should engage in resistance exercise at least 2 times a week or as directed by your caregiver.  Adjust your medicine and food intake as needed if you start a new exercise or sport.  Follow your sick day plan at any time you  are unable to eat or drink as usual.  Avoid tobacco use.  Limit alcohol intake to no more than 1 drink per day for nonpregnant women and 2 drinks per day for men. You should drink alcohol only when you are also eating food. Talk with your caregiver whether alcohol is safe for you. Tell your caregiver if you drink alcohol several times a week.  Follow up with your caregiver regularly.  Schedule an eye exam soon after the diagnosis of type 2 diabetes and then annually.  Perform daily skin and foot care. Examine your skin and feet daily for cuts, bruises, redness, nail problems, bleeding, blisters, or sores. A foot exam by a caregiver should be done annually.  Brush your teeth and gums at least twice a day and floss at least once a day. Follow up with your dentist regularly.  Share your diabetes management plan with your workplace or school.  Stay up-to-date with immunizations.  Learn to manage stress.  Obtain ongoing diabetes education and support as needed.  Participate in, or seek rehabilitation as needed to maintain or improve independence and quality of life. Request a physical or occupational therapy referral if you are having foot or hand numbness or difficulties with grooming,   dressing, eating, or physical activity. SEEK MEDICAL CARE IF:   You are unable to eat food or drink fluids for more than 6 hours.  You have nausea and vomiting for more than 6 hours.  Your blood glucose level is over 240 mg/dL.  There is a change in mental status.  You develop an additional serious illness.  You have diarrhea for more than 6 hours.  You have been sick or have had a fever for a couple of days and are not getting better.  You have pain during any physical activity.  SEEK IMMEDIATE MEDICAL CARE IF:  You have difficulty breathing.  You have moderate to large ketone levels. MAKE SURE YOU:  Understand these instructions.  Will watch your condition.  Will get help right away if  you are not doing well or get worse. Document Released: 04/11/2005 Document Revised: 01/04/2012 Document Reviewed: 11/08/2011 [employer] Patient Information 2014 [employer].  

## 2013-05-01 NOTE — Progress Notes (Signed)
Pre visit review using our clinic review tool, if applicable. No additional management support is needed unless otherwise documented below in the visit note. 

## 2013-05-03 ENCOUNTER — Ambulatory Visit (INDEPENDENT_AMBULATORY_CARE_PROVIDER_SITE_OTHER): Payer: Medicare Other | Admitting: General Practice

## 2013-05-03 DIAGNOSIS — I4891 Unspecified atrial fibrillation: Secondary | ICD-10-CM | POA: Diagnosis not present

## 2013-05-03 LAB — POCT INR: INR: 4.6

## 2013-05-03 NOTE — Assessment & Plan Note (Signed)
She has good rate and rhythm control today 

## 2013-05-03 NOTE — Assessment & Plan Note (Signed)
Her BP is well controlled 

## 2013-05-03 NOTE — Progress Notes (Signed)
Pre-visit discussion using our clinic review tool. No additional management support is needed unless otherwise documented below in the visit note.  

## 2013-05-03 NOTE — Assessment & Plan Note (Signed)
Improvement noted 

## 2013-05-06 ENCOUNTER — Encounter: Payer: Self-pay | Admitting: Internal Medicine

## 2013-05-17 ENCOUNTER — Ambulatory Visit (INDEPENDENT_AMBULATORY_CARE_PROVIDER_SITE_OTHER): Payer: Medicare Other | Admitting: General Practice

## 2013-05-17 DIAGNOSIS — Z5181 Encounter for therapeutic drug level monitoring: Secondary | ICD-10-CM

## 2013-05-17 DIAGNOSIS — I4891 Unspecified atrial fibrillation: Secondary | ICD-10-CM | POA: Diagnosis not present

## 2013-05-17 LAB — POCT INR: INR: 2.1

## 2013-05-17 NOTE — Progress Notes (Signed)
Pre-visit discussion using our clinic review tool. No additional management support is needed unless otherwise documented below in the visit note.  

## 2013-05-21 ENCOUNTER — Other Ambulatory Visit: Payer: Self-pay | Admitting: Internal Medicine

## 2013-06-04 ENCOUNTER — Encounter: Payer: Self-pay | Admitting: Internal Medicine

## 2013-06-04 ENCOUNTER — Ambulatory Visit (INDEPENDENT_AMBULATORY_CARE_PROVIDER_SITE_OTHER): Payer: Medicare Other | Admitting: Internal Medicine

## 2013-06-04 VITALS — BP 124/78 | HR 72 | Temp 97.5°F | Resp 16 | Ht 66.0 in | Wt 200.8 lb

## 2013-06-04 DIAGNOSIS — J45909 Unspecified asthma, uncomplicated: Secondary | ICD-10-CM | POA: Diagnosis not present

## 2013-06-04 DIAGNOSIS — J4520 Mild intermittent asthma, uncomplicated: Secondary | ICD-10-CM

## 2013-06-04 DIAGNOSIS — J209 Acute bronchitis, unspecified: Secondary | ICD-10-CM | POA: Diagnosis not present

## 2013-06-04 DIAGNOSIS — J45901 Unspecified asthma with (acute) exacerbation: Secondary | ICD-10-CM

## 2013-06-04 MED ORDER — ALBUTEROL SULFATE HFA 108 (90 BASE) MCG/ACT IN AERS: 2.0000 | INHALATION_SPRAY | Freq: Four times a day (QID) | RESPIRATORY_TRACT | Status: DC | PRN

## 2013-06-04 MED ORDER — MONTELUKAST SODIUM 10 MG PO TABS: 10.0000 mg | ORAL_TABLET | Freq: Every day | ORAL | Status: DC

## 2013-06-04 MED ORDER — METHYLPREDNISOLONE ACETATE 80 MG/ML IJ SUSP
120.0000 mg | Freq: Once | INTRAMUSCULAR | Status: AC
  Administered 2013-06-04: 120 mg via INTRAMUSCULAR

## 2013-06-04 MED ORDER — BUDESONIDE-FORMOTEROL FUMARATE 160-4.5 MCG/ACT IN AERO: 2.0000 | INHALATION_SPRAY | Freq: Two times a day (BID) | RESPIRATORY_TRACT | Status: DC

## 2013-06-04 MED ORDER — MOXIFLOXACIN HCL 400 MG PO TABS: 400.0000 mg | ORAL_TABLET | Freq: Every day | ORAL | Status: DC

## 2013-06-04 NOTE — Assessment & Plan Note (Signed)
She was given an injection of depo-medrol IM

## 2013-06-04 NOTE — Assessment & Plan Note (Signed)
I have asked her to restart singulair and to be more compliant with the symbicort use She is having a flare of s/s so I gave her an injection of depo-medrol IM

## 2013-06-04 NOTE — Assessment & Plan Note (Signed)
She appears to have a recurrence of infection so I have asked her to take another course of avelox

## 2013-06-04 NOTE — Patient Instructions (Signed)
Asthma, Adult Asthma is a recurring condition in which the airways tighten and narrow. Asthma can make it difficult to breathe. It can cause coughing, wheezing, and shortness of breath. Asthma episodes (also called asthma attacks) range from minor to life-threatening. Asthma cannot be cured, but medicines and lifestyle changes can help control it. CAUSES Asthma is believed to be caused by inherited (genetic) and environmental factors, but its exact cause is unknown. Asthma may be triggered by allergens, lung infections, or irritants in the air. Asthma triggers are different for each person. Common triggers include:   Animal dander.  Dust mites.  Cockroaches.  Pollen from trees or grass.  Mold.  Smoke.  Air pollutants such as dust, household cleaners, hair sprays, aerosol sprays, paint fumes, strong chemicals, or strong odors.  Cold air, weather changes, and winds (which increase molds and pollens in the air).  Strong emotional expressions such as crying or laughing hard.  Stress.  Certain medicines (such as aspirin) or types of drugs (such as beta-blockers).  Sulfites in foods and drinks. Foods and drinks that may contain sulfites include dried fruit, potato chips, and sparkling grape juice.  Infections or inflammatory conditions such as the flu, a cold, or an inflammation of the nasal membranes (rhinitis).  Gastroesophageal reflux disease (GERD).  Exercise or strenuous activity. SYMPTOMS Symptoms may occur immediately after asthma is triggered or many hours later. Symptoms include:  Wheezing.  Excessive nighttime or early morning coughing.  Frequent or severe coughing with a common cold.  Chest tightness.  Shortness of breath. DIAGNOSIS  The diagnosis of asthma is made by a review of your medical history and a physical exam. Tests may also be performed. These may include:  Lung function studies. These tests show how much air you breath in and out.  Allergy  tests.  Imaging tests such as X-rays. TREATMENT  Asthma cannot be cured, but it can usually be controlled. Treatment involves identifying and avoiding your asthma triggers. It also involves medicines. There are 2 classes of medicine used for asthma treatment:   Controller medicines. These prevent asthma symptoms from occurring. They are usually taken every day.  Reliever or rescue medicines. These quickly relieve asthma symptoms. They are used as needed and provide short-term relief. Your health care provider will help you create an asthma action plan. An asthma action plan is a written plan for managing and treating your asthma attacks. It includes a list of your asthma triggers and how they may be avoided. It also includes information on when medicines should be taken and when their dosage should be changed. An action plan may also involve the use of a device called a peak flow meter. A peak flow meter measures how well the lungs are working. It helps you monitor your condition. HOME CARE INSTRUCTIONS   Take medicine as directed by your health care provider. Speak with your health care provider if you have questions about how or when to take the medicines.  Use a peak flow meter as directed by your health care provider. Record and keep track of readings.  Understand and use the action plan to help minimize or stop an asthma attack without needing to seek medical care.  Control your home environment in the following ways to help prevent asthma attacks:  Do not smoke. Avoid being exposed to secondhand smoke.  Change your heating and air conditioning filter regularly.  Limit your use of fireplaces and wood stoves.  Get rid of pests (such as roaches and   mice) and their droppings.  Throw away plants if you see mold on them.  Clean your floors and dust regularly. Use unscented cleaning products.  Try to have someone else vacuum for you regularly. Stay out of rooms while they are being  vacuumed and for a short while afterward. If you vacuum, use a dust mask from a hardware store, a double-layered or microfilter vacuum cleaner bag, or a vacuum cleaner with a HEPA filter.  Replace carpet with wood, tile, or vinyl flooring. Carpet can trap dander and dust.  Use allergy-proof pillows, mattress covers, and box spring covers.  Wash bed sheets and blankets every week in hot water and dry them in a dryer.  Use blankets that are made of polyester or cotton.  Clean bathrooms and kitchens with bleach. If possible, have someone repaint the walls in these rooms with mold-resistant paint. Keep out of the rooms that are being cleaned and painted.  Wash hands frequently. SEEK MEDICAL CARE IF:   You have wheezing, shortness of breath, or a cough even if taking medicine to prevent attacks.  The colored mucus you cough up (sputum) is thicker than usual.  Your sputum changes from clear or white to yellow, green, gray, or bloody.  You have any problems that may be related to the medicines you are taking (such as a rash, itching, swelling, or trouble breathing).  You are using a reliever medicine more than 2 3 times per week.  Your peak flow is still at 50 79% of you personal best after following your action plan for 1 hour. SEEK IMMEDIATE MEDICAL CARE IF:   You seem to be getting worse and are unresponsive to treatment during an asthma attack.  You are short of breath even at rest.  You get short of breath when doing very little physical activity.  You have difficulty eating, drinking, or talking due to asthma symptoms.  You develop chest pain.  You develop a fast heartbeat.  You have a bluish color to your lips or fingernails.  You are lightheaded, dizzy, or faint.  Your peak flow is less than 50% of your personal best.  You have a fever or persistent symptoms for more than 2 3 days.  You have a fever and symptoms suddenly get worse. MAKE SURE YOU:   Understand these  instructions.  Will watch your condition.  Will get help right away if you are not doing well or get worse. Document Released: 04/11/2005 Document Revised: 12/12/2012 Document Reviewed: 11/08/2012 [employer] Patient Information 2014 [employer].  

## 2013-06-04 NOTE — Progress Notes (Signed)
   Subjective:    Patient ID: [name] [name], female    DOB: 1940-08-14, 73 y.o.   MRN: [id number]  Cough This is a recurrent problem. The current episode started more than 1 month ago. The problem has been gradually worsening. The problem occurs every few hours. The cough is productive of purulent sputum. Associated symptoms include rhinorrhea, shortness of breath and wheezing. Pertinent negatives include no chest pain, chills, ear congestion, ear pain, fever, headaches, heartburn, hemoptysis, myalgias, nasal congestion, postnasal drip, rash, sore throat, sweats or weight loss. The symptoms are aggravated by cold air. She has tried a beta-agonist inhaler, prescription cough suppressant and steroid inhaler (she has not been using the symbicort inh very consistently) for the symptoms. The treatment provided moderate relief. Her past medical history is significant for asthma and bronchitis. There is no history of bronchiectasis, COPD, emphysema, environmental allergies or pneumonia.      Review of Systems  Constitutional: Negative.  Negative for fever, chills, weight loss, diaphoresis, activity change, appetite change, fatigue and unexpected weight change.  HENT: Positive for rhinorrhea and sinus pressure. Negative for ear pain, postnasal drip, sneezing, sore throat and trouble swallowing.   Eyes: Negative.   Respiratory: Positive for cough, shortness of breath and wheezing. Negative for apnea, hemoptysis, choking, chest tightness and stridor.   Cardiovascular: Negative.  Negative for chest pain, palpitations and leg swelling.  Gastrointestinal: Negative.  Negative for heartburn and abdominal pain.  Endocrine: Negative.   Genitourinary: Negative.   Musculoskeletal: Negative.  Negative for arthralgias, back pain, myalgias and neck stiffness.  Skin: Negative.  Negative for rash.  Allergic/Immunologic: Negative.  Negative for environmental allergies, food allergies and immunocompromised state.   Neurological: Negative.  Negative for headaches.  Hematological: Negative.  Negative for adenopathy. Does not bruise/bleed easily.  Psychiatric/Behavioral: Negative.        Objective:   Physical Exam  Vitals reviewed. Constitutional: She is oriented to person, place, and time. She appears well-developed and well-nourished.  Non-toxic appearance. She does not have a sickly appearance. She does not appear ill. No distress.  HENT:  Head: Normocephalic and atraumatic.  Mouth/Throat: Oropharynx is clear and moist. No oropharyngeal exudate.  Eyes: Conjunctivae are normal. Right eye exhibits no discharge. Left eye exhibits no discharge. No scleral icterus.  Neck: Normal range of motion. Neck supple. No JVD present. No tracheal deviation present. No thyromegaly present.  Cardiovascular: Normal rate, regular rhythm, normal heart sounds and intact distal pulses.  Exam reveals no gallop and no friction rub.   No murmur heard. Pulmonary/Chest: Accessory muscle usage present. No stridor. Tachypnea noted. No respiratory distress. She has no decreased breath sounds. She has wheezes in the right upper field, the right middle field, the right lower field, the left upper field, the left middle field and the left lower field. She has no rhonchi. She has no rales. She exhibits no tenderness.  Abdominal: Soft. Bowel sounds are normal. She exhibits no distension and no mass. There is no tenderness. There is no rebound and no guarding.  Musculoskeletal: Normal range of motion. She exhibits no edema and no tenderness.  Lymphadenopathy:    She has no cervical adenopathy.  Neurological: She is oriented to person, place, and time.  Skin: Skin is warm and dry. No rash noted. She is not diaphoretic. No erythema. No pallor.          Assessment & Plan:

## 2013-06-04 NOTE — Progress Notes (Signed)
Pre visit review using our clinic review tool, if applicable. No additional management support is needed unless otherwise documented below in the visit note. 

## 2013-06-05 ENCOUNTER — Institutional Professional Consult (permissible substitution): Payer: Medicare Other | Admitting: Emergency Medicine

## 2013-06-12 ENCOUNTER — Encounter: Payer: Self-pay | Admitting: Emergency Medicine

## 2013-06-12 ENCOUNTER — Ambulatory Visit (INDEPENDENT_AMBULATORY_CARE_PROVIDER_SITE_OTHER): Payer: Medicare Other | Admitting: Emergency Medicine

## 2013-06-12 VITALS — BP 122/80 | HR 76 | Ht 66.0 in | Wt 202.8 lb

## 2013-06-12 DIAGNOSIS — J45909 Unspecified asthma, uncomplicated: Secondary | ICD-10-CM

## 2013-06-12 DIAGNOSIS — J4520 Mild intermittent asthma, uncomplicated: Secondary | ICD-10-CM

## 2013-06-12 NOTE — Progress Notes (Signed)
Subjective:    Patient ID: [name] [name], female    DOB: 05/27/1940, 73 y.o.   MRN: [id number]  HPI 73 yo woman, never smoker, hx of migraines, GERD, A Fib, allergic rhinitis. She has frequent sinusitis, bronchitis.  Carries the dx of asthma that was made 2-3 yrs ago. She has DOE, some wheeze, cough. She has a lot of drug allergies but has been able to tolerate avelox. She completed two courses avelox recently for productive cough with clear to green mucous. She was started on symbicort 1 year ago. She is on nexium bid. She is on loratadine and fluticasone nasal spray bid. She does nasal washes rarely. She believes the Symbicort is helpful.    Review of Systems  Constitutional: Negative for fever and unexpected weight change.  HENT: Positive for congestion and postnasal drip. Negative for dental problem, ear pain, nosebleeds, rhinorrhea, sinus pressure, sneezing, sore throat and trouble swallowing.   Eyes: Negative for redness and itching.  Respiratory: Positive for cough, chest tightness, shortness of breath and wheezing.   Cardiovascular: Negative for palpitations and leg swelling.  Gastrointestinal: Negative for nausea and vomiting.  Genitourinary: Negative for dysuria.  Musculoskeletal: Negative for joint swelling.  Skin: Negative for rash.  Neurological: Negative for headaches.  Hematological: Does not bruise/bleed easily.  Psychiatric/Behavioral: Negative for dysphoric mood. The patient is not nervous/anxious.    Past Medical History  Diagnosis Date  . Migraine, unspecified, without mention of intractable migraine without mention of status migrainosus   . HLD (hyperlipidemia)   . Aortic insufficiency   . Colon polyps   . Hypothyroidism   . GERD (gastroesophageal reflux disease)   . Osteoarthritis   . HTN (hypertension)   . Depression   . PAF (paroxysmal atrial fibrillation)     on coumadin and Cardiazem  . Asthma   . Allergic rhinitis   . Chronic anticoagulation      on coumadin  . Obesity   . Diastolic dysfunction   . S/P cardiac cath 2003    no obstructive disease      Family History  Problem Relation Age of Onset  . Heart disease    . Hypertension    . COPD    . Crohn's disease    . Diabetes    . Depression    . Dementia       History   Social History  . Marital Status: Married    Spouse Name: N/A    Number of Children: 3  . Years of Education: N/A   Occupational History  . retired [employer] fundraiser    Social History Main Topics  . Smoking status: Never Smoker   . Smokeless tobacco: Never Used  . Alcohol Use: 3.0 oz/week    5 Glasses of wine per week  . Drug Use: No  . Sexual Activity: Not Currently   Other Topics Concern  . Not on file   Social History Narrative  . No narrative on file  She has lived in [place] and [place].   Allergies  Allergen Reactions  . Cardizem [Diltiazem Hcl]     edema  . Acetaminophen   . Azithromycin   . Cefaclor   . Cephalexin   . Clindamycin   . Ibuprofen   . Levofloxacin   . Nsaids     Respiratory-Asthma  . Penicillins   . Pravastatin Sodium   . Simvastatin   . Sulfonamide Derivatives   . Tetracycline      Outpatient Prescriptions Prior to  Visit  Medication Sig Dispense Refill  . albuterol (PROAIR HFA) 108 (90 BASE) MCG/ACT inhaler Inhale 2 puffs into the lungs every 6 (six) hours as needed for wheezing or shortness of breath.  8.5 each  11  . BENICAR 40 MG tablet TAKE 1 TABLET BY MOUTH EVERY DAY  90 tablet  3  . budesonide-formoterol (SYMBICORT) 160-4.5 MCG/ACT inhaler Inhale 2 puffs into the lungs 2 (two) times daily.  10.2 g  11  . calcium-vitamin D (OSCAL WITH D 500-200) 500-200 MG-UNIT per tablet Take 2 tablets by mouth daily.       . CRESTOR 10 MG tablet TAKE 1/2 TABLET ONCE DAILY  30 tablet  5  . esomeprazole (NEXIUM) 40 MG capsule TAKE 1 CAPSULE BY MOUTH TWICE A DAY  60 capsule  11  . fluticasone (FLONASE) 50 MCG/ACT nasal spray Place 2 sprays into the nose daily.  48 g  4  .  Glucosamine-Chondroit-Vit C-Mn (GLUCOSAMINE 1500 COMPLEX PO) Take by mouth daily.       Marland Kitchen. levothyroxine (SYNTHROID, LEVOTHROID) 25 MCG tablet TAKE 1 TABLET BY MOUTH ONCE DAILY  90 tablet  1  . Loratadine (CLARITIN PO) Take by mouth.      . metFORMIN (GLUCOPHAGE-XR) 500 MG 24 hr tablet Take 1 tablet (500 mg total) by mouth daily with breakfast.  90 tablet  1  . montelukast (SINGULAIR) 10 MG tablet Take 1 tablet (10 mg total) by mouth at bedtime.  90 tablet  3  . warfarin (COUMADIN) 5 MG tablet TAKE 1 TABLET BY MOUTH ONCE DAILY OR AS DIRECTED BY COUMADIN CLINIC  140 tablet  0  . HYDROcodone-homatropine (HYCODAN) 5-1.5 MG/5ML syrup Take 5 mLs by mouth every 8 (eight) hours as needed for cough.  120 mL  0  . moxifloxacin (AVELOX) 400 MG tablet Take 1 tablet (400 mg total) by mouth daily.  7 tablet  0   No facility-administered medications prior to visit.         Objective:   Physical Exam Filed Vitals:   06/12/13 1506  BP: 122/80  Pulse: 76  Height: 5\' 6"  (1.676 m)  Weight: 202 lb 12.8 oz (91.989 kg)  SpO2: 95%   Gen: Pleasant, well-nourished, in no distress,  normal affect  ENT: No lesions, narrow posterior pharynx with some erythema, oropharynx clear, no postnasal drip  Neck: No JVD, no TMG, no carotid bruits  Lungs: No use of accessory muscles, clear without rales or rhonchi  Cardiovascular: RRR, heart sounds normal, no murmur or gallops, no peripheral edema  Musculoskeletal: No deformities, no cyanosis or clubbing  Neuro: alert, non focal  Skin: Warm, no lesions or rashes     Assessment & Plan:  Asthma, mild intermittent Characterized primarily by cough but also with dyspnea and some wheeze. She believes that she has responded to symbicort and certsainly could have an asthma-like syndrome due to allergies, rhinitis and GERD - will treat allergies and GERD aggressively - full PFT - continue symbicort for now.  - albuterol prn - decide about continuing the symbicort  depending on the results of PFT, improvement etc

## 2013-06-12 NOTE — Assessment & Plan Note (Signed)
Characterized primarily by cough but also with dyspnea and some wheeze. She believes that she has responded to symbicort and certsainly could have an asthma-like syndrome due to allergies, rhinitis and GERD - will treat allergies and GERD aggressively - full PFT - continue symbicort for now.  - albuterol prn - decide about continuing the symbicort depending on the results of PFT, improvement etc

## 2013-06-12 NOTE — Patient Instructions (Signed)
Please continue your loratadine daily Continue your fluticasone nasal spray twice a day Start doing nasal saline washes once a day until next visit Continue nexium twice a day Continue Symbicort twice a day We will perform pulmonary function testing at your next office visit Follow with Dr Delton CoombesByrum in 1 month with full PFT's

## 2013-06-22 ENCOUNTER — Other Ambulatory Visit: Payer: Self-pay | Admitting: Endocrinology

## 2013-06-26 ENCOUNTER — Ambulatory Visit (INDEPENDENT_AMBULATORY_CARE_PROVIDER_SITE_OTHER): Payer: Medicare Other | Admitting: Family Medicine

## 2013-06-26 DIAGNOSIS — Z5181 Encounter for therapeutic drug level monitoring: Secondary | ICD-10-CM | POA: Diagnosis not present

## 2013-06-26 DIAGNOSIS — I4891 Unspecified atrial fibrillation: Secondary | ICD-10-CM

## 2013-06-26 LAB — POCT INR: INR: 4.6

## 2013-07-03 ENCOUNTER — Ambulatory Visit: Payer: Medicare Other | Admitting: Internal Medicine

## 2013-07-10 ENCOUNTER — Ambulatory Visit (INDEPENDENT_AMBULATORY_CARE_PROVIDER_SITE_OTHER): Payer: Medicare Other | Admitting: General Practice

## 2013-07-10 DIAGNOSIS — Z5181 Encounter for therapeutic drug level monitoring: Secondary | ICD-10-CM | POA: Diagnosis not present

## 2013-07-10 DIAGNOSIS — I4891 Unspecified atrial fibrillation: Secondary | ICD-10-CM

## 2013-07-10 LAB — POCT INR: INR: 3.8

## 2013-07-10 NOTE — Progress Notes (Signed)
Pre visit review using our clinic review tool, if applicable. No additional management support is needed unless otherwise documented below in the visit note. 

## 2013-07-19 ENCOUNTER — Other Ambulatory Visit: Payer: Self-pay | Admitting: Emergency Medicine

## 2013-07-19 DIAGNOSIS — J45909 Unspecified asthma, uncomplicated: Secondary | ICD-10-CM

## 2013-07-22 ENCOUNTER — Encounter: Payer: Self-pay | Admitting: Emergency Medicine

## 2013-07-22 ENCOUNTER — Ambulatory Visit (INDEPENDENT_AMBULATORY_CARE_PROVIDER_SITE_OTHER): Payer: Medicare Other | Admitting: Emergency Medicine

## 2013-07-22 VITALS — BP 128/78 | HR 69 | Ht 66.0 in | Wt 209.0 lb

## 2013-07-22 DIAGNOSIS — J4520 Mild intermittent asthma, uncomplicated: Secondary | ICD-10-CM

## 2013-07-22 DIAGNOSIS — J309 Allergic rhinitis, unspecified: Secondary | ICD-10-CM

## 2013-07-22 DIAGNOSIS — J45909 Unspecified asthma, uncomplicated: Secondary | ICD-10-CM | POA: Diagnosis not present

## 2013-07-22 NOTE — Progress Notes (Signed)
PFT done today. 

## 2013-07-22 NOTE — Assessment & Plan Note (Signed)
-   verified on PFT  - continue symbicort

## 2013-07-22 NOTE — Progress Notes (Signed)
   Subjective:    Patient ID: [name] [name], female    DOB: December 31, 1940, 73 y.o.   MRN: [id number]  HPI 73 yo woman, never smoker, hx of migraines, GERD, A Fib, allergic rhinitis. She has frequent sinusitis, bronchitis.  Carries the dx of asthma that was made 2-3 yrs ago. She has DOE, some wheeze, cough. She has a lot of drug allergies but has been able to tolerate avelox. She completed two courses avelox recently for productive cough with clear to green mucous. She was started on symbicort 1 year ago. She is on nexium bid. She is on loratadine and fluticasone nasal spray bid. She does nasal washes rarely. She believes the Symbicort is helpful.   ROV 07/22/13 -- follows up for allergies, cough/wheeze dx as asthma. Returns today after PFT > shows moderate AFL with a BD response consistent w dx asthma. She is on fluticasone qd, loratadine, singulair. She doesn't do NSW right now. She uses symbicort bid, rare albuterol use.    Review of Systems  Constitutional: Negative for fever and unexpected weight change.  HENT: Positive for congestion and postnasal drip. Negative for dental problem, ear pain, nosebleeds, rhinorrhea, sinus pressure, sneezing, sore throat and trouble swallowing.   Eyes: Negative for redness and itching.  Respiratory: Positive for cough, chest tightness, shortness of breath and wheezing.   Cardiovascular: Negative for palpitations and leg swelling.  Gastrointestinal: Negative for nausea and vomiting.  Genitourinary: Negative for dysuria.  Musculoskeletal: Negative for joint swelling.  Skin: Negative for rash.  Neurological: Negative for headaches.  Hematological: Does not bruise/bleed easily.  Psychiatric/Behavioral: Negative for dysphoric mood. The patient is not nervous/anxious.        Objective:   Physical Exam Filed Vitals:   07/22/13 1429  BP: 128/78  Pulse: 69  Height: 5\' 6"  (1.676 m)  Weight: 209 lb (94.802 kg)  SpO2: 97%   Gen: Pleasant,  well-nourished, in no distress,  normal affect  ENT: No lesions, narrow posterior pharynx with some erythema, oropharynx clear, no postnasal drip  Neck: No JVD, no TMG, no carotid bruits  Lungs: No use of accessory muscles, clear without rales or rhonchi  Cardiovascular: RRR, heart sounds normal, no murmur or gallops, no peripheral edema  Musculoskeletal: No deformities, no cyanosis or clubbing  Neuro: alert, non focal  Skin: Warm, no lesions or rashes     Assessment & Plan:  ALLERGIC RHINITIS - discussed adding NSW and chlorpheniramine to fluticasone and loratadine.    Asthma, mild intermittent - verified on PFT  - continue symbicort

## 2013-07-22 NOTE — Assessment & Plan Note (Signed)
-   discussed adding NSW and chlorpheniramine to fluticasone and loratadine.

## 2013-07-22 NOTE — Patient Instructions (Signed)
Please continue your current medications.  You can consider starting daily nasal saline washes if your allergies worsen You could consider using a decongestant containing chlorpheniramine is needed in addition to loratadine.  Follow with Dr Delton CoombesByrum in 6 months or sooner if you have any problems

## 2013-08-02 ENCOUNTER — Ambulatory Visit (INDEPENDENT_AMBULATORY_CARE_PROVIDER_SITE_OTHER): Payer: Medicare Other | Admitting: General Practice

## 2013-08-02 DIAGNOSIS — Z5181 Encounter for therapeutic drug level monitoring: Secondary | ICD-10-CM | POA: Diagnosis not present

## 2013-08-02 DIAGNOSIS — I4891 Unspecified atrial fibrillation: Secondary | ICD-10-CM

## 2013-08-02 LAB — POCT INR: INR: 1.5

## 2013-08-02 NOTE — Progress Notes (Signed)
Pre visit review using our clinic review tool, if applicable. No additional management support is needed unless otherwise documented below in the visit note. 

## 2013-08-16 ENCOUNTER — Ambulatory Visit (INDEPENDENT_AMBULATORY_CARE_PROVIDER_SITE_OTHER): Payer: Medicare Other | Admitting: General Practice

## 2013-08-16 DIAGNOSIS — Z5181 Encounter for therapeutic drug level monitoring: Secondary | ICD-10-CM

## 2013-08-16 DIAGNOSIS — I4891 Unspecified atrial fibrillation: Secondary | ICD-10-CM

## 2013-08-16 LAB — POCT INR: INR: 3

## 2013-08-16 NOTE — Progress Notes (Signed)
Pre visit review using our clinic review tool, if applicable. No additional management support is needed unless otherwise documented below in the visit note. 

## 2013-08-20 ENCOUNTER — Encounter (HOSPITAL_COMMUNITY): Payer: Self-pay | Admitting: Emergency Medicine

## 2013-08-20 ENCOUNTER — Emergency Department (HOSPITAL_COMMUNITY): Payer: Medicare Other

## 2013-08-20 ENCOUNTER — Inpatient Hospital Stay (HOSPITAL_COMMUNITY)
Admission: EM | Admit: 2013-08-20 | Discharge: 2013-08-26 | DRG: 202 | Disposition: A | Discharge status: Home / Self Care | Payer: Medicare Other | Attending: Internal Medicine | Admitting: Internal Medicine

## 2013-08-20 DIAGNOSIS — L270 Generalized skin eruption due to drugs and medicaments taken internally: Secondary | ICD-10-CM | POA: Diagnosis present

## 2013-08-20 DIAGNOSIS — I10 Essential (primary) hypertension: Secondary | ICD-10-CM | POA: Diagnosis not present

## 2013-08-20 DIAGNOSIS — R05 Cough: Secondary | ICD-10-CM

## 2013-08-20 DIAGNOSIS — E669 Obesity, unspecified: Secondary | ICD-10-CM | POA: Diagnosis present

## 2013-08-20 DIAGNOSIS — E1165 Type 2 diabetes mellitus with hyperglycemia: Secondary | ICD-10-CM

## 2013-08-20 DIAGNOSIS — J9600 Acute respiratory failure, unspecified whether with hypoxia or hypercapnia: Secondary | ICD-10-CM | POA: Diagnosis present

## 2013-08-20 DIAGNOSIS — J209 Acute bronchitis, unspecified: Secondary | ICD-10-CM | POA: Diagnosis not present

## 2013-08-20 DIAGNOSIS — K219 Gastro-esophageal reflux disease without esophagitis: Secondary | ICD-10-CM

## 2013-08-20 DIAGNOSIS — J387 Other diseases of larynx: Secondary | ICD-10-CM | POA: Diagnosis not present

## 2013-08-20 DIAGNOSIS — J4520 Mild intermittent asthma, uncomplicated: Secondary | ICD-10-CM

## 2013-08-20 DIAGNOSIS — M1990 Unspecified osteoarthritis, unspecified site: Secondary | ICD-10-CM

## 2013-08-20 DIAGNOSIS — Z882 Allergy status to sulfonamides status: Secondary | ICD-10-CM

## 2013-08-20 DIAGNOSIS — J45901 Unspecified asthma with (acute) exacerbation: Secondary | ICD-10-CM | POA: Diagnosis present

## 2013-08-20 DIAGNOSIS — Z889 Allergy status to unspecified drugs, medicaments and biological substances status: Secondary | ICD-10-CM | POA: Diagnosis not present

## 2013-08-20 DIAGNOSIS — J449 Chronic obstructive pulmonary disease, unspecified: Secondary | ICD-10-CM | POA: Diagnosis not present

## 2013-08-20 DIAGNOSIS — Z6832 Body mass index (BMI) 32.0-32.9, adult: Secondary | ICD-10-CM

## 2013-08-20 DIAGNOSIS — R5381 Other malaise: Secondary | ICD-10-CM | POA: Diagnosis not present

## 2013-08-20 DIAGNOSIS — R0902 Hypoxemia: Secondary | ICD-10-CM | POA: Diagnosis present

## 2013-08-20 DIAGNOSIS — I359 Nonrheumatic aortic valve disorder, unspecified: Secondary | ICD-10-CM | POA: Diagnosis present

## 2013-08-20 DIAGNOSIS — IMO0001 Reserved for inherently not codable concepts without codable children: Secondary | ICD-10-CM | POA: Diagnosis present

## 2013-08-20 DIAGNOSIS — J45909 Unspecified asthma, uncomplicated: Secondary | ICD-10-CM | POA: Diagnosis not present

## 2013-08-20 DIAGNOSIS — E039 Hypothyroidism, unspecified: Secondary | ICD-10-CM | POA: Diagnosis present

## 2013-08-20 DIAGNOSIS — J9819 Other pulmonary collapse: Secondary | ICD-10-CM | POA: Diagnosis not present

## 2013-08-20 DIAGNOSIS — R21 Rash and other nonspecific skin eruption: Secondary | ICD-10-CM | POA: Diagnosis not present

## 2013-08-20 DIAGNOSIS — E785 Hyperlipidemia, unspecified: Secondary | ICD-10-CM

## 2013-08-20 DIAGNOSIS — F329 Major depressive disorder, single episode, unspecified: Secondary | ICD-10-CM

## 2013-08-20 DIAGNOSIS — R058 Other specified cough: Secondary | ICD-10-CM

## 2013-08-20 DIAGNOSIS — F3289 Other specified depressive episodes: Secondary | ICD-10-CM

## 2013-08-20 DIAGNOSIS — Z880 Allergy status to penicillin: Secondary | ICD-10-CM | POA: Diagnosis not present

## 2013-08-20 DIAGNOSIS — J309 Allergic rhinitis, unspecified: Secondary | ICD-10-CM

## 2013-08-20 DIAGNOSIS — I4891 Unspecified atrial fibrillation: Secondary | ICD-10-CM

## 2013-08-20 DIAGNOSIS — Z7901 Long term (current) use of anticoagulants: Secondary | ICD-10-CM

## 2013-08-20 DIAGNOSIS — J0100 Acute maxillary sinusitis, unspecified: Secondary | ICD-10-CM | POA: Diagnosis not present

## 2013-08-20 DIAGNOSIS — R5383 Other fatigue: Secondary | ICD-10-CM | POA: Diagnosis not present

## 2013-08-20 DIAGNOSIS — Z888 Allergy status to other drugs, medicaments and biological substances status: Secondary | ICD-10-CM | POA: Diagnosis not present

## 2013-08-20 DIAGNOSIS — J019 Acute sinusitis, unspecified: Secondary | ICD-10-CM | POA: Diagnosis not present

## 2013-08-20 DIAGNOSIS — R059 Cough, unspecified: Secondary | ICD-10-CM | POA: Diagnosis not present

## 2013-08-20 DIAGNOSIS — Z1231 Encounter for screening mammogram for malignant neoplasm of breast: Secondary | ICD-10-CM

## 2013-08-20 DIAGNOSIS — J45902 Unspecified asthma with status asthmaticus: Secondary | ICD-10-CM | POA: Diagnosis not present

## 2013-08-20 DIAGNOSIS — J40 Bronchitis, not specified as acute or chronic: Secondary | ICD-10-CM | POA: Diagnosis not present

## 2013-08-20 DIAGNOSIS — E118 Type 2 diabetes mellitus with unspecified complications: Secondary | ICD-10-CM | POA: Diagnosis present

## 2013-08-20 DIAGNOSIS — R0602 Shortness of breath: Secondary | ICD-10-CM | POA: Diagnosis not present

## 2013-08-20 DIAGNOSIS — N3281 Overactive bladder: Secondary | ICD-10-CM

## 2013-08-20 DIAGNOSIS — I5189 Other ill-defined heart diseases: Secondary | ICD-10-CM

## 2013-08-20 DIAGNOSIS — Z5181 Encounter for therapeutic drug level monitoring: Secondary | ICD-10-CM

## 2013-08-20 LAB — COMPREHENSIVE METABOLIC PANEL
ALBUMIN: 3.7 g/dL (ref 3.5–5.2)
ALT: 16 U/L (ref 0–35)
AST: 19 U/L (ref 0–37)
Alkaline Phosphatase: 77 U/L (ref 39–117)
BUN: 9 mg/dL (ref 6–23)
CALCIUM: 9.1 mg/dL (ref 8.4–10.5)
CO2: 26 mEq/L (ref 19–32)
CREATININE: 0.55 mg/dL (ref 0.50–1.10)
Chloride: 96 mEq/L (ref 96–112)
GFR calc Af Amer: >90 mL/min (ref >90)
GFR calc non Af Amer: >90 mL/min (ref >90)
Glucose, Bld: 133 mg/dL — ABNORMAL HIGH (ref 70–99)
Potassium: 4 mEq/L (ref 3.7–5.3)
Sodium: 136 mEq/L — ABNORMAL LOW (ref 137–147)
Total Bilirubin: 0.3 mg/dL (ref 0.3–1.2)
Total Protein: 7.7 g/dL (ref 6.0–8.3)

## 2013-08-20 LAB — CBC WITH DIFFERENTIAL/PLATELET
BASOS ABS: 0.1 10*3/uL (ref 0.0–0.1)
BASOS PCT: 1 % (ref 0–1)
EOS ABS: 0.5 10*3/uL (ref 0.0–0.7)
EOS PCT: 5 % (ref 0–5)
HEMATOCRIT: 41.8 % (ref 36.0–46.0)
Hemoglobin: 14.1 g/dL (ref 12.0–15.0)
Lymphocytes Relative: 15 % (ref 12–46)
Lymphs Abs: 1.4 10*3/uL (ref 0.7–4.0)
MCH: 30.9 pg (ref 26.0–34.0)
MCHC: 33.7 g/dL (ref 30.0–36.0)
MCV: 91.5 fL (ref 78.0–100.0)
MONO ABS: 0.8 10*3/uL (ref 0.1–1.0)
Monocytes Relative: 8 % (ref 3–12)
Neutro Abs: 7 10*3/uL (ref 1.7–7.7)
Neutrophils Relative %: 71 % (ref 43–77)
Platelets: 338 10*3/uL (ref 150–400)
RBC: 4.57 MIL/uL (ref 3.87–5.11)
RDW: 15 % (ref 11.5–15.5)
WBC: 9.7 10*3/uL (ref 4.0–10.5)

## 2013-08-20 LAB — PRO B NATRIURETIC PEPTIDE: Pro B Natriuretic peptide (BNP): 172.8 pg/mL — ABNORMAL HIGH (ref 0–125)

## 2013-08-20 LAB — I-STAT TROPONIN, ED: TROPONIN I, POC: 0 ng/mL (ref 0.00–0.08)

## 2013-08-20 MED ORDER — IPRATROPIUM BROMIDE 0.02 % IN SOLN
0.5000 mg | Freq: Once | RESPIRATORY_TRACT | Status: AC
  Administered 2013-08-20: 0.5 mg via RESPIRATORY_TRACT
  Filled 2013-08-20: qty 2.5

## 2013-08-20 MED ORDER — PREDNISONE 20 MG PO TABS
60.0000 mg | ORAL_TABLET | Freq: Once | ORAL | Status: AC
  Administered 2013-08-21: 60 mg via ORAL
  Filled 2013-08-20: qty 3

## 2013-08-20 MED ORDER — ALBUTEROL SULFATE (2.5 MG/3ML) 0.083% IN NEBU
5.0000 mg | INHALATION_SOLUTION | Freq: Once | RESPIRATORY_TRACT | Status: AC
  Administered 2013-08-20: 5 mg via RESPIRATORY_TRACT
  Filled 2013-08-20: qty 6

## 2013-08-20 MED ORDER — ALBUTEROL (5 MG/ML) CONTINUOUS INHALATION SOLN
10.0000 mg/h | INHALATION_SOLUTION | Freq: Once | RESPIRATORY_TRACT | Status: AC
  Administered 2013-08-20: 10 mg/h via RESPIRATORY_TRACT
  Filled 2013-08-20: qty 20

## 2013-08-20 NOTE — ED Notes (Signed)
Respiratory called for breathing treatment administration.

## 2013-08-20 NOTE — ED Notes (Signed)
Dr. Wickline at bedside.  

## 2013-08-20 NOTE — ED Notes (Signed)
Pt. reports asthma attack with productive cough and wheezing for several days unrelieved by rescue MDI .

## 2013-08-20 NOTE — ED Provider Notes (Signed)
CSN: [id number]     Arrival date & time 08/20/13  1955 History   First MD Initiated Contact with Patient 08/20/13 2301     Chief Complaint  Patient presents with  . Asthma      Patient is a 73 y.o. female presenting with shortness of breath. The history is provided by the patient.  Shortness of Breath Severity:  Moderate Onset quality:  Gradual Duration:  3 days Timing:  Intermittent Progression:  Worsening Chronicity:  Recurrent Relieved by:  Rest Worsened by:  Activity Associated symptoms: chest pain and cough   Associated symptoms: no fever and no vomiting   pt reports she has h/o asthma (denies ICU admits or intubations) She reports for past 3 days she has had increased cough/wheezing She reports she has dyspnea on exertion She is a nonsmoker She is not on home oxygen She mentions brief episode of CP that has since resolved   Past Medical History  Diagnosis Date  . Migraine, unspecified, without mention of intractable migraine without mention of status migrainosus   . HLD (hyperlipidemia)   . Aortic insufficiency   . Colon polyps   . Hypothyroidism   . GERD (gastroesophageal reflux disease)   . Osteoarthritis   . HTN (hypertension)   . Depression   . PAF (paroxysmal atrial fibrillation)     on coumadin and Cardiazem  . Asthma   . Allergic rhinitis   . Chronic anticoagulation     on coumadin  . Obesity   . Diastolic dysfunction   . S/P cardiac cath 2003    no obstructive disease    Past Surgical History  Procedure Laterality Date  . Appendectomy    . Abdominal hysterectomy    . Tubal ligation    . [place] echocardiography  2007   Family History  Problem Relation Age of Onset  . Heart disease    . Hypertension    . COPD    . Crohn's disease    . Diabetes    . Depression    . Dementia     History  Substance Use Topics  . Smoking status: Never Smoker   . Smokeless tobacco: Never Used  . Alcohol Use: 3.0 oz/week    5 Glasses of wine per week   OB  History   Grav Para Term Preterm Abortions TAB SAB Ect Mult Living                 Review of Systems  Constitutional: Positive for fatigue. Negative for fever.  Respiratory: Positive for cough and shortness of breath.   Cardiovascular: Positive for chest pain and leg swelling.  Gastrointestinal: Negative for vomiting.  All other systems reviewed and are negative.     Allergies  Cardizem; Acetaminophen; Azithromycin; Cefaclor; Cephalexin; Clindamycin; Ibuprofen; Nsaids; Penicillins; Pravastatin sodium; Simvastatin; Sulfonamide derivatives; Tetracycline; Avelox; and Levaquin  Home Medications   Prior to Admission medications   Medication Sig Start Date End Date Taking? Authorizing Provider  albuterol (PROAIR HFA) 108 (90 BASE) MCG/ACT inhaler Inhale 2 puffs into the lungs every 6 (six) hours as needed for wheezing or shortness of breath. 06/04/13  Yes Etta Grandchildhomas L Jones, MD  budesonide-formoterol [facility](SYMBICORT) 160-4.5 MCG/ACT inhaler Inhale 2 puffs into the lungs 2 (two) times daily. 06/04/13  Yes Etta Grandchildhomas L Jones, MD  calcium-vitamin D (OSCAL WITH D 500-200) 500-200 MG-UNIT per tablet Take 2 tablets by mouth daily.    Yes Historical Provider, MD  esomeprazole (NEXIUM) 40 MG capsule Take 40 mg by  mouth 2 (two) times daily before a meal.   Yes Historical Provider, MD  fluticasone (FLONASE) 50 MCG/ACT nasal spray Place 2 sprays into both nostrils daily.   Yes Historical Provider, MD  Glucosamine-Chondroit-Vit C-Mn (GLUCOSAMINE 1500 COMPLEX PO) Take 1 tablet by mouth daily.    Yes Historical Provider, MD  guaifenesin (ROBITUSSIN) 100 MG/5ML syrup Take 200 mg by mouth 3 (three) times daily as needed for cough.   Yes Historical Provider, MD  levothyroxine (SYNTHROID, LEVOTHROID) 25 MCG tablet Take 25 mcg by mouth daily before breakfast.   Yes Historical Provider, MD  loratadine (CLARITIN) 10 MG tablet Take 10 mg by mouth daily.   Yes Historical Provider, MD  metFORMIN (GLUCOPHAGE-XR) 500 MG 24 hr  tablet Take 500 mg by mouth daily with breakfast.   Yes Historical Provider, MD  montelukast (SINGULAIR) 10 MG tablet Take 1 tablet (10 mg total) by mouth at bedtime. 06/04/13  Yes Etta Grandchild, MD  olmesartan (BENICAR) 40 MG tablet Take 40 mg by mouth daily.   Yes Historical Provider, MD  rosuvastatin (CRESTOR) 10 MG tablet Take 5 mg by mouth daily.   Yes Historical Provider, MD  warfarin (COUMADIN) 5 MG tablet Take 5-7.5 mg by mouth daily. Take 1 tablet on Wednesday then take 1 and 1/2 tablets every other day   Yes Historical Provider, MD   BP 177/68  Pulse 120  Temp(Src) 98.2 F (36.8 C) (Oral)  Resp 22  SpO2 100%  LMP 12/01/2010 Physical Exam CONSTITUTIONAL: Well developed/well nourished HEAD: Normocephalic/atraumatic EYES: EOMI/PERRL ENMT: Mucous membranes moist NECK: supple no meningeal signs SPINE:entire spine nontender CV: S1/S2 noted LUNGS: mild tachypnea noted.  Pt is able to speak to me clearly.  Diffuse wheezing noted bilaterally ABDOMEN: soft, nontender, no rebound or guarding GU:no cva tenderness NEURO: Pt is awake/alert, moves all extremitiesx4 EXTREMITIES: pulses normal, full ROM. She has symmetric pitting edema to her lower extremities SKIN: warm, color normal, rash noted to lower extremities PSYCH: no abnormalities of mood noted   ED Course  Procedures   11:50 PM Pt with worsening asthma exacerbation.  She was hypoxic on arrival.  I feel she will benefit from admission to hospital.  Pt agreeable 12:35 AM D/w dr gardner.  Will admit to tele  Labs Review Labs Reviewed  COMPREHENSIVE METABOLIC PANEL - Abnormal; Notable for the following:    Sodium 136 (*)    Glucose, Bld 133 (*)    All other components within normal limits  PRO B NATRIURETIC PEPTIDE - Abnormal; Notable for the following:    Pro B Natriuretic peptide (BNP) 172.8 (*)    All other components within normal limits  CBC WITH DIFFERENTIAL  Rosezena Sensor, ED    Imaging Review Dg Chest 2  View  08/20/2013   CLINICAL DATA:  Asthma  EXAM: CHEST  2 VIEW  COMPARISON:  Prior chest x-ray 06/17/2011  FINDINGS: Stable cardiac and mediastinal contours. Trace atherosclerotic calcification in the transverse aorta. Stable mild pulmonary hyperinflation, central bronchitic changes and interstitial prominence consistent with a chronic obstructive process. No focal airspace consolidation, edema, pleural effusion or pneumothorax. No acute osseous abnormality.  IMPRESSION: Stable chest x-ray without evidence of acute cardiopulmonary process.   Electronically Signed   By: Malachy Moan M.D.   On: 08/20/2013 21:17     EKG Interpretation   Date/Time:  Tuesday August 20 2013 20:03:37 EDT Ventricular Rate:  88 PR Interval:  146 QRS Duration: 78 QT Interval:  364 QTC Calculation: 440 R Axis:  57 Text Interpretation:  Normal sinus rhythm Septal infarct , age  undetermined Abnormal ECG Confirmed by Bebe ShaggyWICKLINE  MD, Raiden Haydu ([id number]) on  08/20/2013 11:15:16 PM      MDM   Final diagnoses:  Status asthmaticus    Nursing notes including past medical history and social history reviewed and considered in documentation xrays reviewed and considered Labs/vital reviewed and considered     Joya Gaskinsonald W Iwao Shamblin, MD 08/21/13 [phone]

## 2013-08-20 NOTE — ED Notes (Signed)
Pt reports worsening SOB. Respirations even and unlabored but pt to be placed in next available room.

## 2013-08-21 DIAGNOSIS — IMO0001 Reserved for inherently not codable concepts without codable children: Secondary | ICD-10-CM

## 2013-08-21 DIAGNOSIS — J45901 Unspecified asthma with (acute) exacerbation: Secondary | ICD-10-CM | POA: Diagnosis present

## 2013-08-21 DIAGNOSIS — E1165 Type 2 diabetes mellitus with hyperglycemia: Secondary | ICD-10-CM

## 2013-08-21 DIAGNOSIS — J45902 Unspecified asthma with status asthmaticus: Secondary | ICD-10-CM | POA: Diagnosis not present

## 2013-08-21 DIAGNOSIS — R21 Rash and other nonspecific skin eruption: Secondary | ICD-10-CM | POA: Diagnosis present

## 2013-08-21 DIAGNOSIS — I10 Essential (primary) hypertension: Secondary | ICD-10-CM | POA: Diagnosis not present

## 2013-08-21 LAB — PROTIME-INR
INR: 3.04 — AB (ref 0.00–1.49)
INR: 2.94 — AB (ref 0.00–1.49)
Prothrombin Time: 30.4 seconds — ABNORMAL HIGH (ref 11.6–15.2)
Prothrombin Time: 29.6 seconds — ABNORMAL HIGH (ref 11.6–15.2)

## 2013-08-21 LAB — GLUCOSE, CAPILLARY
GLUCOSE-CAPILLARY: 164 mg/dL — AB (ref 70–99)
GLUCOSE-CAPILLARY: 151 mg/dL — AB (ref 70–99)
Glucose-Capillary: 204 mg/dL — ABNORMAL HIGH (ref 70–99)
Glucose-Capillary: 128 mg/dL — ABNORMAL HIGH (ref 70–99)

## 2013-08-21 MED ORDER — IRBESARTAN 300 MG PO TABS
300.0000 mg | ORAL_TABLET | Freq: Every day | ORAL | Status: DC
  Administered 2013-08-21: 300 mg via ORAL
  Filled 2013-08-21: qty 1

## 2013-08-21 MED ORDER — PREDNISONE 50 MG PO TABS
60.0000 mg | ORAL_TABLET | Freq: Two times a day (BID) | ORAL | Status: DC
  Administered 2013-08-21 – 2013-08-26 (×10): 60 mg via ORAL
  Filled 2013-08-21 (×13): qty 1

## 2013-08-21 MED ORDER — ALBUTEROL SULFATE (2.5 MG/3ML) 0.083% IN NEBU: 2.5000 mg | INHALATION_SOLUTION | Freq: Four times a day (QID) | RESPIRATORY_TRACT | Status: DC | PRN

## 2013-08-21 MED ORDER — METRONIDAZOLE 500 MG PO TABS
500.0000 mg | ORAL_TABLET | Freq: Three times a day (TID) | ORAL | Status: DC
  Administered 2013-08-21 – 2013-08-24 (×9): 500 mg via ORAL
  Filled 2013-08-21 (×13): qty 1

## 2013-08-21 MED ORDER — ALBUTEROL SULFATE (2.5 MG/3ML) 0.083% IN NEBU
2.5000 mg | INHALATION_SOLUTION | Freq: Four times a day (QID) | RESPIRATORY_TRACT | Status: DC
  Administered 2013-08-21 – 2013-08-23 (×7): 2.5 mg via RESPIRATORY_TRACT
  Filled 2013-08-21 (×7): qty 3

## 2013-08-21 MED ORDER — WARFARIN VIDEO: 1.0000 | Freq: Once | Status: DC

## 2013-08-21 MED ORDER — HEPARIN SODIUM (PORCINE) 5000 UNIT/ML IJ SOLN
5000.0000 [IU] | Freq: Three times a day (TID) | INTRAMUSCULAR | Status: DC
  Filled 2013-08-21: qty 1

## 2013-08-21 MED ORDER — DM-GUAIFENESIN ER 30-600 MG PO TB12
1.0000 | ORAL_TABLET | Freq: Two times a day (BID) | ORAL | Status: DC
  Administered 2013-08-21 – 2013-08-23 (×6): 1 via ORAL
  Filled 2013-08-21 (×8): qty 1

## 2013-08-21 MED ORDER — PREDNISONE 50 MG PO TABS
60.0000 mg | ORAL_TABLET | Freq: Every day | ORAL | Status: DC
  Administered 2013-08-21: 60 mg via ORAL
  Filled 2013-08-21 (×3): qty 1

## 2013-08-21 MED ORDER — FLUTICASONE PROPIONATE 50 MCG/ACT NA SUSP
2.0000 | Freq: Every day | NASAL | Status: DC
  Administered 2013-08-21 – 2013-08-23 (×3): 2 via NASAL
  Filled 2013-08-21: qty 16

## 2013-08-21 MED ORDER — LORATADINE 10 MG PO TABS
10.0000 mg | ORAL_TABLET | Freq: Every day | ORAL | Status: DC
  Administered 2013-08-21 – 2013-08-26 (×6): 10 mg via ORAL
  Filled 2013-08-21 (×6): qty 1

## 2013-08-21 MED ORDER — ALBUTEROL SULFATE (2.5 MG/3ML) 0.083% IN NEBU: 2.5000 mg | INHALATION_SOLUTION | RESPIRATORY_TRACT | Status: DC | PRN

## 2013-08-21 MED ORDER — WARFARIN SODIUM 5 MG PO TABS
5.0000 mg | ORAL_TABLET | Freq: Once | ORAL | Status: DC
  Filled 2013-08-21: qty 1

## 2013-08-21 MED ORDER — WARFARIN SODIUM 2.5 MG PO TABS
2.5000 mg | ORAL_TABLET | Freq: Once | ORAL | Status: AC
  Administered 2013-08-21: 2.5 mg via ORAL
  Filled 2013-08-21: qty 1

## 2013-08-21 MED ORDER — METFORMIN HCL ER 500 MG PO TB24
500.0000 mg | ORAL_TABLET | Freq: Every day | ORAL | Status: DC
  Administered 2013-08-21 – 2013-08-26 (×6): 500 mg via ORAL
  Filled 2013-08-21 (×8): qty 1

## 2013-08-21 MED ORDER — ALBUTEROL SULFATE (2.5 MG/3ML) 0.083% IN NEBU
2.5000 mg | INHALATION_SOLUTION | Freq: Four times a day (QID) | RESPIRATORY_TRACT | Status: DC
  Administered 2013-08-21: 2.5 mg via RESPIRATORY_TRACT
  Filled 2013-08-21: qty 3

## 2013-08-21 MED ORDER — AZITHROMYCIN 500 MG PO TABS
500.0000 mg | ORAL_TABLET | Freq: Every day | ORAL | Status: DC
  Administered 2013-08-21 – 2013-08-23 (×3): 500 mg via ORAL
  Filled 2013-08-21 (×4): qty 1

## 2013-08-21 MED ORDER — PANTOPRAZOLE SODIUM 40 MG PO TBEC
80.0000 mg | DELAYED_RELEASE_TABLET | Freq: Every day | ORAL | Status: DC
  Administered 2013-08-21 – 2013-08-23 (×3): 80 mg via ORAL
  Filled 2013-08-21 (×3): qty 2

## 2013-08-21 MED ORDER — PREDNISONE 20 MG PO TABS: 50.0000 mg | ORAL_TABLET | Freq: Every day | ORAL | Status: DC

## 2013-08-21 MED ORDER — LEVOTHYROXINE SODIUM 25 MCG PO TABS
25.0000 ug | ORAL_TABLET | Freq: Every day | ORAL | Status: DC
  Administered 2013-08-21 – 2013-08-26 (×6): 25 ug via ORAL
  Filled 2013-08-21 (×8): qty 1

## 2013-08-21 MED ORDER — ATORVASTATIN CALCIUM 10 MG PO TABS
10.0000 mg | ORAL_TABLET | Freq: Every day | ORAL | Status: DC
  Filled 2013-08-21 (×2): qty 1

## 2013-08-21 MED ORDER — SODIUM CHLORIDE 0.9 % IJ SOLN
3.0000 mL | Freq: Two times a day (BID) | INTRAMUSCULAR | Status: DC
  Administered 2013-08-21 – 2013-08-26 (×4): 3 mL via INTRAVENOUS

## 2013-08-21 MED ORDER — WARFARIN - PHARMACIST DOSING INPATIENT
Freq: Every day | Status: DC
  Administered 2013-08-21: 18:00:00

## 2013-08-21 MED ORDER — ALBUTEROL SULFATE HFA 108 (90 BASE) MCG/ACT IN AERS: 2.0000 | INHALATION_SPRAY | Freq: Four times a day (QID) | RESPIRATORY_TRACT | Status: DC | PRN

## 2013-08-21 MED ORDER — MONTELUKAST SODIUM 10 MG PO TABS
10.0000 mg | ORAL_TABLET | Freq: Every day | ORAL | Status: DC
  Administered 2013-08-21 – 2013-08-25 (×6): 10 mg via ORAL
  Filled 2013-08-21 (×7): qty 1

## 2013-08-21 MED ORDER — HYDRALAZINE HCL 20 MG/ML IJ SOLN: 10.0000 mg | INTRAMUSCULAR | Status: DC | PRN

## 2013-08-21 MED ORDER — BUDESONIDE-FORMOTEROL FUMARATE 160-4.5 MCG/ACT IN AERO
2.0000 | INHALATION_SPRAY | Freq: Two times a day (BID) | RESPIRATORY_TRACT | Status: DC
  Administered 2013-08-21 – 2013-08-23 (×5): 2 via RESPIRATORY_TRACT
  Filled 2013-08-21: qty 6

## 2013-08-21 MED ORDER — COUMADIN BOOK
1.0000 | Freq: Once | Status: AC
  Administered 2013-08-21: 1
  Filled 2013-08-21: qty 1

## 2013-08-21 NOTE — Progress Notes (Signed)
Patient admitted after midnight. Chart reviewed. Patient examined. Has been sick on and off for 2-3 months. Acute bronchitis, sinusitis. Has had a course of levofloxacin and 2 courses of Avelox. She developed a rash on both feet with fluoroquinolones. Multiple drug allergies. Have asked the ID pharmacist to assist with antibiotic selection to treat her sinusitis and bronchitis which has not improved and resulted in asthma exacerbation and admission to the hospital. Might require Primaxin or gentamicin but await recommendations. Discontinue telemetry. Continue steroids, oxygen, nasal steroids.  Crista Curborinna Lawerence Dery, M.D. [facility] [phone]

## 2013-08-21 NOTE — Progress Notes (Signed)
ANTICOAGULATION CONSULT NOTE - Initial Consult  Pharmacy Consult for Warfarin  Indication: atrial fibrillation  Vital Signs: Temp: 98.2 F (36.8 C) (04/28 2010) Temp src: Oral (04/28 2010) BP: 183/65 mmHg (04/29 0110) Pulse Rate: 120 (04/28 2222)  Labs:  Recent Labs  08/20/13 2008 08/21/13 0101  HGB 14.1  --   HCT 41.8  --   PLT 338  --   LABPROT  --  30.4*  INR  --  3.04*  CREATININE 0.55  --    Medical History: Past Medical History  Diagnosis Date  . Migraine, unspecified, without mention of intractable migraine without mention of status migrainosus   . HLD (hyperlipidemia)   . Aortic insufficiency   . Colon polyps   . Hypothyroidism   . GERD (gastroesophageal reflux disease)   . Osteoarthritis   . HTN (hypertension)   . Depression   . PAF (paroxysmal atrial fibrillation)     on coumadin and Cardiazem  . Asthma   . Allergic rhinitis   . Chronic anticoagulation     on coumadin  . Obesity   . Diastolic dysfunction   . S/P cardiac cath 2003    no obstructive disease     Medications:  Warfarin PTA: 5 mg on wed, 7.5 all other days  Assessment: 73 y/o F on warfarin PTA for afib. INR 3.04 on admit. CBC good.   Goal of Therapy:  INR 2-3 Monitor platelets by anticoagulation protocol: Yes   Plan:  -Re-check INR at ~1000 today just to make sure there is no trend up in setting of acute illness. If trend down as expected, can likely resume home dose -Daily PT/INR -Monitor for bleeding  Abran DukeJames Flecia Shutter 08/21/2013,1:33 AM

## 2013-08-21 NOTE — Progress Notes (Addendum)
ANTICOAGULATION CONSULT - COUMADIN  Pharmacy Consult for : Coumadin  HPI/Indication :  atrial fibrillation    -73 y.o.female admitted Type II or unspecified type diabetes mellitus, HTN, Rash,  atrial fibrillation, who is on chronic Coumadin.  Allergies: Allergies  Allergen Reactions  . Cardizem [Diltiazem Hcl]     edema  . Acetaminophen   . Azithromycin   . Cefaclor   . Cephalexin   . Clindamycin   . Ibuprofen   . Nsaids     Respiratory-Asthma  . Penicillins   . Pravastatin Sodium   . Simvastatin   . Sulfonamide Derivatives   . Tetracycline   . Avelox [Moxifloxacin Hcl In Nacl] Rash  . Levaquin [Levofloxacin In D5w] Rash    Dosing weight : 91.6 kg  Problems: Principal Problem:   Status asthmaticus Active Problems:   HYPERTENSION   Type II or unspecified type diabetes mellitus without mention of complication, uncontrolled   Rash   Medical / Surgical History: Past Medical History  Diagnosis Date  . Migraine, unspecified, without mention of intractable migraine without mention of status migrainosus   . HLD (hyperlipidemia)   . Aortic insufficiency   . Colon polyps   . Hypothyroidism   . GERD (gastroesophageal reflux disease)   . Osteoarthritis   . HTN (hypertension)   . Depression   . PAF (paroxysmal atrial fibrillation)     on coumadin and Cardiazem  . Asthma   . Allergic rhinitis   . Chronic anticoagulation     on coumadin  . Obesity   . Diastolic dysfunction   . S/P cardiac cath 2003    no obstructive disease    Past Surgical History  Procedure Laterality Date  . Appendectomy    . Abdominal hysterectomy    . Tubal ligation    . [place] echocardiography  2007    Labs:  Recent Labs  08/20/13 2008 08/21/13 0101 08/21/13 0936  HGB 14.1  --   --   HCT 41.8  --   --   PLT 338  --   --   LABPROT  --  30.4* 29.6*  INR  --  3.04* 2.94*  CREATININE 0.55  --   --    Estimated Creatinine Clearance: 70.3 ml/min (by C-G formula based on Cr of  0.55).  Current Medication[s] Include: Medication PTA: Medication Sig  . albuterol (PROAIR HFA) 108 (90 BASE) MCG/ACT inhaler Inhale 2 puffs into the lungs every 6 (six) hours as needed for wheezing or shortness of breath.  . budesonide-formoterol (SYMBICORT) 160-4.5 MCG/ACT inhaler Inhale 2 puffs into the lungs 2 (two) times daily.  . calcium-vitamin D (OSCAL WITH D 500-200) 500-200 MG-UNIT per tablet Take 2 tablets by mouth daily.   Marland Kitchen. esomeprazole (NEXIUM) 40 MG capsule Take 40 mg by mouth 2 (two) times daily before a meal.  . fluticasone (FLONASE) 50 MCG/ACT nasal spray Place 2 sprays into both nostrils daily.  . Glucosamine-Chondroit-Vit C-Mn (GLUCOSAMINE 1500 COMPLEX PO) Take 1 tablet by mouth daily.   Marland Kitchen. guaifenesin (ROBITUSSIN) 100 MG/5ML syrup Take 200 mg by mouth 3 (three) times daily as needed for cough.  . levothyroxine (SYNTHROID, LEVOTHROID) 25 MCG tablet Take 25 mcg by mouth daily before breakfast.  . loratadine (CLARITIN) 10 MG tablet Take 10 mg by mouth daily.  . metFORMIN (GLUCOPHAGE-XR) 500 MG 24 hr tablet Take 500 mg by mouth daily with breakfast.  . montelukast (SINGULAIR) 10 MG tablet Take 1 tablet (10 mg total) by mouth at bedtime.  .Marland Kitchen  olmesartan (BENICAR) 40 MG tablet Take 40 mg by mouth daily.  . rosuvastatin (CRESTOR) 10 MG tablet Take 5 mg by mouth daily.  Marland Kitchen. warfarin (COUMADIN) 5 MG tablet Take 5-7.5 mg by mouth daily. Take 1 tablet on Wednesday then take 1 and 1/2 tablets every other day   Scheduled:  Scheduled:  . atorvastatin  10 mg Oral q1800  . budesonide-formoterol  2 puff Inhalation BID  . fluticasone  2 spray Each Nare Daily  . irbesartan  300 mg Oral Daily  . levothyroxine  25 mcg Oral QAC breakfast  . loratadine  10 mg Oral Daily  . metFORMIN  500 mg Oral Q breakfast  . montelukast  10 mg Oral QHS  . pantoprazole  80 mg Oral Q1200  . predniSONE  60 mg Oral Q breakfast  . sodium chloride  3 mL Intravenous Q12H  . warfarin  5 mg Oral ONCE-1800  .  Warfarin - Pharmacist Dosing Inpatient   Does not apply q1800   Infusion[s]: Infusions:  None Antibiotic[s]: Anti-infectives   None      Assessment:  73 y/o female on chronic Coumadin for atrial fibrillation.  Repeat INR 2.94, No evidence of bleeding complications noted    Goals:  Target INR of 2-3.  PLAN:  1. Will give Coumadin 5 mg po today.   2. Daily INR's, CBC.  Monitor for bleeding complications. Follow platelet counts.  Laurena BeringEarle J Trenden Hazelrigg, Pharm. D. 08/21/2013, 12:33 PM  ADDENDUM:  2:14 PM   Azithromycin and Flagyl to be started this afternoon for suspected unresolved infection.  Flagyl likely to quickly enhance the INR response from Coumadin.  PLAN:  1.  Reduce today's Coumadin dose to 2.5 mg x 1. 2.  Continue monitoring as previously ordered.  Maren Wiesen, Elisha HeadlandEarle J,  Pharm.D.

## 2013-08-21 NOTE — Progress Notes (Signed)
Patient up to bathroom with 1 assist. Unsteady gait initially but able to ambulate with 1 assist to BR. Became very SOB on return from bathroom to bed. Instructed patient that she should not be taken off oxygen to ambulate and only to ambulate/get OOB with assistance. Given extension tubing for further ambulation in room.  Patient verbalized understanding and agrees with plan. Will call for assistance. Continue to monitor. Daneil DanKrista M Patina Spanier, RN

## 2013-08-21 NOTE — H&P (Addendum)
[facility] History and Physical  [name] [name] JYN:[id number] DOB: Mar 11, 1941 DOA: 08/20/2013  Referring physician: EDP PCP: Sanda Lingerhomas Jones, MD   Chief Complaint: Asthma   HPI: [name] [name] is a 73 y.o. female who presents to the ED with 3 day history of worsening SOB and wheezing.  She states this is a typical asthma exacerbation for her, and typically has troubles with asthma around this time of year (maple pollen is the trigger she states to me).  Increased cough and wheezing for past 3 days, she presents to the ED.  She does have a macular rash on her feet which she feels is unrelated to her SOB, and states that this is most likely due to a recent course of Avelox (apparently this sort of rash has happened in the past with Avelox).  Review of Systems: Systems reviewed.  As above, otherwise negative  Past Medical History  Diagnosis Date  . Migraine, unspecified, without mention of intractable migraine without mention of status migrainosus   . HLD (hyperlipidemia)   . Aortic insufficiency   . Colon polyps   . Hypothyroidism   . GERD (gastroesophageal reflux disease)   . Osteoarthritis   . HTN (hypertension)   . Depression   . PAF (paroxysmal atrial fibrillation)     on coumadin and Cardiazem  . Asthma   . Allergic rhinitis   . Chronic anticoagulation     on coumadin  . Obesity   . Diastolic dysfunction   . S/P cardiac cath 2003    no obstructive disease    Past Surgical History  Procedure Laterality Date  . Appendectomy    . Abdominal hysterectomy    . Tubal ligation    . [place] echocardiography  2007   Social History:  reports that she has never smoked. She has never used smokeless tobacco. She reports that she drinks about 3 ounces of alcohol per week. She reports that she does not use illicit drugs.  Allergies  Allergen Reactions  . Cardizem [Diltiazem Hcl]     edema  . Acetaminophen   . Azithromycin   . Cefaclor   . Cephalexin   .  Clindamycin   . Ibuprofen   . Nsaids     Respiratory-Asthma  . Penicillins   . Pravastatin Sodium   . Simvastatin   . Sulfonamide Derivatives   . Tetracycline   . Avelox [Moxifloxacin Hcl In Nacl] Rash  . Levaquin [Levofloxacin In D5w] Rash    Family History  Problem Relation Age of Onset  . Heart disease    . Hypertension    . COPD    . Crohn's disease    . Diabetes    . Depression    . Dementia       Prior to Admission medications   Medication Sig Start Date End Date Taking? Authorizing Provider  albuterol (PROAIR HFA) 108 (90 BASE) MCG/ACT inhaler Inhale 2 puffs into the lungs every 6 (six) hours as needed for wheezing or shortness of breath. 06/04/13  Yes Etta Grandchildhomas L Jones, MD  budesonide-formoterol [facility](SYMBICORT) 160-4.5 MCG/ACT inhaler Inhale 2 puffs into the lungs 2 (two) times daily. 06/04/13  Yes Etta Grandchildhomas L Jones, MD  calcium-vitamin D (OSCAL WITH D 500-200) 500-200 MG-UNIT per tablet Take 2 tablets by mouth daily.    Yes Historical Provider, MD  esomeprazole (NEXIUM) 40 MG capsule Take 40 mg by mouth 2 (two) times daily before a meal.   Yes Historical Provider, MD  fluticasone (FLONASE) 50 MCG/ACT  nasal spray Place 2 sprays into both nostrils daily.   Yes Historical Provider, MD  Glucosamine-Chondroit-Vit C-Mn (GLUCOSAMINE 1500 COMPLEX PO) Take 1 tablet by mouth daily.    Yes Historical Provider, MD  guaifenesin (ROBITUSSIN) 100 MG/5ML syrup Take 200 mg by mouth 3 (three) times daily as needed for cough.   Yes Historical Provider, MD  levothyroxine (SYNTHROID, LEVOTHROID) 25 MCG tablet Take 25 mcg by mouth daily before breakfast.   Yes Historical Provider, MD  loratadine (CLARITIN) 10 MG tablet Take 10 mg by mouth daily.   Yes Historical Provider, MD  metFORMIN (GLUCOPHAGE-XR) 500 MG 24 hr tablet Take 500 mg by mouth daily with breakfast.   Yes Historical Provider, MD  montelukast (SINGULAIR) 10 MG tablet Take 1 tablet (10 mg total) by mouth at bedtime. 06/04/13  Yes Etta Grandchild, MD  olmesartan (BENICAR) 40 MG tablet Take 40 mg by mouth daily.   Yes Historical Provider, MD  rosuvastatin (CRESTOR) 10 MG tablet Take 5 mg by mouth daily.   Yes Historical Provider, MD  warfarin (COUMADIN) 5 MG tablet Take 5-7.5 mg by mouth daily. Take 1 tablet on Wednesday then take 1 and 1/2 tablets every other day   Yes Historical Provider, MD   Physical Exam: Filed Vitals:   08/21/13 0028  BP: 180/62  Pulse:   Temp:   Resp: 25    BP 180/62  Pulse 120  Temp(Src) 98.2 F (36.8 C) (Oral)  Resp 25  SpO2 100%  LMP 12/01/2010  General Appearance:    Alert, oriented, no distress, appears stated age  Head:    Normocephalic, atraumatic  Eyes:    PERRL, EOMI, sclera non-icteric        Nose:   Nares without drainage or epistaxis. Mucosa, turbinates normal  Throat:   Moist mucous membranes. Oropharynx without erythema or exudate.  Neck:   Supple. No carotid bruits.  No thyromegaly.  No lymphadenopathy.   Back:     No CVA tenderness, no spinal tenderness  Lungs:     Diffuse wheezes  Chest wall:    No tenderness to palpitation  Heart:    Regular rate and rhythm without murmurs, gallops, rubs  Abdomen:     Soft, non-tender, nondistended, normal bowel sounds, no organomegaly  Genitalia:    deferred  Rectal:    deferred  Extremities:   No clubbing, cyanosis or edema.  Pulses:   2+ and symmetric all extremities  Skin:   Papular rash on dorsum of both feet  Lymph nodes:   Cervical, supraclavicular, and axillary nodes normal  Neurologic:   CNII-XII intact. Normal strength, sensation and reflexes      throughout    Labs on Admission:  Basic Metabolic Panel:  Recent Labs Lab 08/20/13 2008  NA 136*  K 4.0  CL 96  CO2 26  GLUCOSE 133*  BUN 9  CREATININE 0.55  CALCIUM 9.1   Liver Function Tests:  Recent Labs Lab 08/20/13 2008  AST 19  ALT 16  ALKPHOS 77  BILITOT 0.3  PROT 7.7  ALBUMIN 3.7   No results found for this basename: LIPASE, AMYLASE,  in the last  168 hours No results found for this basename: AMMONIA,  in the last 168 hours CBC:  Recent Labs Lab 08/20/13 2008  WBC 9.7  NEUTROABS 7.0  HGB 14.1  HCT 41.8  MCV 91.5  PLT 338   Cardiac Enzymes: No results found for this basename: CKTOTAL, CKMB, CKMBINDEX, TROPONINI,  in the  last 168 hours  BNP (last 3 results)  Recent Labs  08/20/13 2009  PROBNP 172.8*   CBG: No results found for this basename: GLUCAP,  in the last 168 hours  Radiological Exams on Admission: Dg Chest 2 View  08/20/2013   CLINICAL DATA:  Asthma  EXAM: CHEST  2 VIEW  COMPARISON:  Prior chest x-ray 06/17/2011  FINDINGS: Stable cardiac and mediastinal contours. Trace atherosclerotic calcification in the transverse aorta. Stable mild pulmonary hyperinflation, central bronchitic changes and interstitial prominence consistent with a chronic obstructive process. No focal airspace consolidation, edema, pleural effusion or pneumothorax. No acute osseous abnormality.  IMPRESSION: Stable chest x-ray without evidence of acute cardiopulmonary process.   Electronically Signed   By: Malachy MoanHeath  McCullough M.D.   On: 08/20/2013 21:17    EKG: Independently reviewed.  Assessment/Plan Principal Problem:   Status asthmaticus Active Problems:   HYPERTENSION   Type II or unspecified type diabetes mellitus without mention of complication, uncontrolled   Rash   1. Acute asthma exacerbation - admitting, putting patient on prednisone, breathing treatments per adult wheeze protocol. 2. DM2 - continue metformin, may need to add SSI while on prednisone, CBG checks AC/HS for now 3. HTN - putting patient on PRN hydralazine, no evidence of CHF: CXR clear, pro BNP not elevated 4. Rash - patient with rash on dorsum of B feet, she describes this as a drug rash associated with recent course of avelox and does not feel that this is related to her current asthma exacerbation.  Will monitor the rash, but at this point given the patients clear  description of a history of similar rashes with avelox and levaquin in the past, and her clear history of asthma attacks around this time of the year (tree pollen allergy), I am inclined to agree that this rash is likely a red herring.  If patient does not demonstrate improvement with treatment by morning, then would proceed with full work up, though most vasculitic causes of SOB do not present with a clear CXR.    Code Status: Full Code  Family Communication: No family in room Disposition Plan: Admit to obs   Time spent: 70 min  Hillary BowJared M Jett Fukuda [facility] Pager [phone]  If 7AM-7PM, please contact the day team taking care of the patient [email or web address] Password [email or web address] 08/21/2013, 12:46 AM

## 2013-08-21 NOTE — Progress Notes (Signed)
UR Completed.  Steven Veazie Jane Nicol Herbig [phone] 08/21/2013  

## 2013-08-21 NOTE — Progress Notes (Addendum)
Nutrition Brief Note  Patient identified on the Malnutrition Screening Tool (MST) Report for recent weight lost without trying and eating poorly because of a decreased appetite.  Patient's weight down, however, she reports she   was trying to lose weight PTA.  Wt Readings from Last 15 Encounters:  08/21/13 201 lb 14.4 oz (91.581 kg)  07/22/13 209 lb (94.802 kg)  06/12/13 202 lb 12.8 oz (91.989 kg)  06/04/13 200 lb 12 oz (91.06 kg)  05/01/13 204 lb (92.534 kg)  04/10/13 202 lb (91.627 kg)  04/03/13 206 lb (93.441 kg)  01/29/13 201 lb (91.173 kg)  09/28/12 201 lb 4 oz (91.286 kg)  09/26/12 199 lb (90.266 kg)  05/30/12 198 lb (89.812 kg)  04/11/12 201 lb 12.8 oz (91.536 kg)  04/04/12 204 lb (92.534 kg)  01/24/12 205 lb 8 oz (93.214 kg)  12/08/11 208 lb (94.348 kg)    Body mass index is 32.6 kg/(m^2). Patient meets criteria for Obesity Class I based on current BMI.   Current diet order is Carbohydrate Modified.  Patient states her appetite is returning.  Labs and medications reviewed.   No nutrition interventions warranted at this time. If nutrition issues arise, please consult RD.   [name] [name], RD, LDN Pager #: [phone] After-Hours Pager #: [phone]

## 2013-08-22 DIAGNOSIS — J019 Acute sinusitis, unspecified: Secondary | ICD-10-CM | POA: Diagnosis present

## 2013-08-22 DIAGNOSIS — R21 Rash and other nonspecific skin eruption: Secondary | ICD-10-CM | POA: Diagnosis not present

## 2013-08-22 DIAGNOSIS — J9600 Acute respiratory failure, unspecified whether with hypoxia or hypercapnia: Secondary | ICD-10-CM | POA: Diagnosis present

## 2013-08-22 DIAGNOSIS — J45902 Unspecified asthma with status asthmaticus: Secondary | ICD-10-CM | POA: Diagnosis not present

## 2013-08-22 DIAGNOSIS — J209 Acute bronchitis, unspecified: Secondary | ICD-10-CM | POA: Diagnosis present

## 2013-08-22 LAB — PROTIME-INR
INR: 2.66 — ABNORMAL HIGH (ref 0.00–1.49)
Prothrombin Time: 27.4 seconds — ABNORMAL HIGH (ref 11.6–15.2)

## 2013-08-22 LAB — GLUCOSE, CAPILLARY
GLUCOSE-CAPILLARY: 136 mg/dL — AB (ref 70–99)
Glucose-Capillary: 118 mg/dL — ABNORMAL HIGH (ref 70–99)
Glucose-Capillary: 154 mg/dL — ABNORMAL HIGH (ref 70–99)
Glucose-Capillary: 149 mg/dL — ABNORMAL HIGH (ref 70–99)

## 2013-08-22 MED ORDER — ROSUVASTATIN CALCIUM 5 MG PO TABS
5.0000 mg | ORAL_TABLET | Freq: Every day | ORAL | Status: DC
  Administered 2013-08-22 – 2013-08-25 (×4): 5 mg via ORAL
  Filled 2013-08-22 (×5): qty 1

## 2013-08-22 MED ORDER — WARFARIN SODIUM 5 MG PO TABS
5.0000 mg | ORAL_TABLET | Freq: Once | ORAL | Status: AC
  Administered 2013-08-22: 5 mg via ORAL
  Filled 2013-08-22: qty 1

## 2013-08-22 NOTE — Care Management Note (Signed)
    Page 1 of 1   08/26/2013     3:18:42 PM CARE MANAGEMENT NOTE 08/26/2013  Patient:  [name] [name],[name] [name]   Account Number:  [id number]  Date Initiated:  08/22/2013  Documentation initiated by:  Ruchi Stoney  Subjective/Objective Assessment:   Pt adm on 08/20/13 with asthma exacerbtion.  PTA, pt independent of ADLS.     Action/Plan:   Will follow for dc needs as pt progresses.   Anticipated DC Date:  08/22/2013   Anticipated DC Plan:  HOME/SELF CARE      DC Planning Services  CM consult      Choice offered to / List presented to:             Status of service:  Completed, signed off Medicare Important Message given?  YES (If response is "NO", the following Medicare IM given date fields will be blank) Date Medicare IM given:  08/26/2013 Date Additional Medicare IM given:    Discharge Disposition:  HOME/SELF CARE  Per UR Regulation:  Reviewed for med. necessity/level of care/duration of stay  If discussed at Long Length of Stay Meetings, dates discussed:    Comments:  08/26/13 Sidney AceJulie Jini Horiuchi, RN, BSN [phone] Pt for dc home today; denies any needs for home.

## 2013-08-22 NOTE — Progress Notes (Signed)
UR Completed.  [place] Riesen Jane Suzanne Kho [phone] 08/22/2013  

## 2013-08-22 NOTE — Progress Notes (Signed)
[facility] PROGRESS NOTE  [name] PolingMargaret M [name] VWU:[id number] DOB: 05/31/1940 DOA: 08/20/2013 PCP: Sanda Lingerhomas Jones, MD  Assessment/Plan:  Principal Problem:   Status asthmaticus: still wheezing and dyspneic. Continue abx, nebs, steroids, oxygen Active Problems:   HYPERTENSION   Type II or unspecified type diabetes mellitus without mention of complication, uncontrolled   Rash: on right foot only.  monitor   Acute bronchitis   Acute sinusitis   Acute respiratory failure: continue oxygen   Code Status:  full Family Communication:  Husband 4/29 Disposition Plan:  home  HPI/Subjective: Tired, still DOE and wheezing. Coughing up green.   Objective: Filed Vitals:   08/22/13 1348  BP: 149/64  Pulse: 97  Temp: 98.2 F (36.8 C)  Resp: 18    Intake/Output Summary (Last 24 hours) at 08/22/13 1958 Last data filed at 08/22/13 1230  Gross per 24 hour  Intake    480 ml  Output      0 ml  Net    480 ml   Filed Weights   08/21/13 0138  Weight: 91.581 kg (201 lb 14.4 oz)    Exam:   General:  Weak appearing  Cardiovascular: RRR without MGR  Respiratory: bilateral wheeze. Moderate air movement  Abdomen: S, NT, ND  Ext: minimal pedal edema.  Erythematous papules right foot.  Basic Metabolic Panel:  Recent Labs Lab 08/20/13 2008  NA 136*  K 4.0  CL 96  CO2 26  GLUCOSE 133*  BUN 9  CREATININE 0.55  CALCIUM 9.1   Liver Function Tests:  Recent Labs Lab 08/20/13 2008  AST 19  ALT 16  ALKPHOS 77  BILITOT 0.3  PROT 7.7  ALBUMIN 3.7   No results found for this basename: LIPASE, AMYLASE,  in the last 168 hours No results found for this basename: AMMONIA,  in the last 168 hours CBC:  Recent Labs Lab 08/20/13 2008  WBC 9.7  NEUTROABS 7.0  HGB 14.1  HCT 41.8  MCV 91.5  PLT 338   Cardiac Enzymes: No results found for this basename: CKTOTAL, CKMB, CKMBINDEX, TROPONINI,  in the last 168 hours BNP (last 3 results)  Recent Labs  08/20/13 2009   PROBNP 172.8*   CBG:  Recent Labs Lab 08/21/13 1603 08/21/13 2110 08/22/13 0558 08/22/13 1126 08/22/13 1611  GLUCAP 128* 151* 118* 136* 154*    No results found for this or any previous visit (from the past 240 hour(s)).   Studies: Dg Chest 2 View  08/20/2013   CLINICAL DATA:  Asthma  EXAM: CHEST  2 VIEW  COMPARISON:  Prior chest x-ray 06/17/2011  FINDINGS: Stable cardiac and mediastinal contours. Trace atherosclerotic calcification in the transverse aorta. Stable mild pulmonary hyperinflation, central bronchitic changes and interstitial prominence consistent with a chronic obstructive process. No focal airspace consolidation, edema, pleural effusion or pneumothorax. No acute osseous abnormality.  IMPRESSION: Stable chest x-ray without evidence of acute cardiopulmonary process.   Electronically Signed   By: Malachy MoanHeath  McCullough M.D.   On: 08/20/2013 21:17    Scheduled Meds: . albuterol  2.5 mg Nebulization QID  . azithromycin  500 mg Oral Daily  . budesonide-formoterol  2 puff Inhalation BID  . dextromethorphan-guaiFENesin  1 tablet Oral BID  . fluticasone  2 spray Each Nare Daily  . levothyroxine  25 mcg Oral QAC breakfast  . loratadine  10 mg Oral Daily  . metFORMIN  500 mg Oral Q breakfast  . metroNIDAZOLE  500 mg Oral 3 times per day  . montelukast  10 mg Oral QHS  . pantoprazole  80 mg Oral Q1200  . predniSONE  60 mg Oral BID WC  . rosuvastatin  5 mg Oral q1800  . sodium chloride  3 mL Intravenous Q12H  . warfarin  1 each Does not apply Once  . Warfarin - Pharmacist Dosing Inpatient   Does not apply q1800   Continuous Infusions:   Time spent: 25 minutes  Christiane Haorinna L Brelan Hannen, MD  [facility] Pager [phone]. If 7PM-7AM, please contact night-coverage at [email or web address], password [email or web address] 08/22/2013, 7:58 PM  LOS: 2 days

## 2013-08-22 NOTE — Progress Notes (Signed)
ANTICOAGULATION CONSULT - COUMADIN  Pharmacy Consult for : Coumadin  HPI/Indication :  atrial fibrillation    -73 y.o.female admitted for cough, SOB bronchitis/COPD flare. Hx ofType II or unspecified type diabetes mellitus, HTN, Rash,  atrial fibrillation, who is on chronic Coumadin for Afib. She is currently in SR VSS, admitting INR 3> 2.66 after lower than home dose Coumadin administered since DI with Flagyl.   Home Coumadin dose 7.5mg  daily except 5mg  on WED.  CBC stable, no bleeding noted.  Allergies: Allergies  Allergen Reactions  . Penicillins Hives  . Tetracycline Swelling    Swollen tongue  . Cardizem [Diltiazem Hcl]     edema  . Acetaminophen   . Azithromycin   . Cefaclor   . Cephalexin   . Clindamycin   . Ibuprofen   . Nsaids     Respiratory-Asthma  . Pravastatin Sodium   . Simvastatin   . Sulfonamide Derivatives   . Avelox [Moxifloxacin Hcl In Nacl] Rash  . Levaquin [Levofloxacin In D5w] Rash    Dosing weight : 91.6 kg  Problems: Principal Problem:   Status asthmaticus Active Problems:   HYPERTENSION   Type II or unspecified type diabetes mellitus without mention of complication, uncontrolled   Rash   Medical / Surgical History: Past Medical History  Diagnosis Date  . Migraine, unspecified, without mention of intractable migraine without mention of status migrainosus   . HLD (hyperlipidemia)   . Aortic insufficiency   . Colon polyps   . Hypothyroidism   . GERD (gastroesophageal reflux disease)   . Osteoarthritis   . HTN (hypertension)   . Depression   . PAF (paroxysmal atrial fibrillation)     on coumadin and Cardiazem  . Asthma   . Allergic rhinitis   . Chronic anticoagulation     on coumadin  . Obesity   . Diastolic dysfunction   . S/P cardiac cath 2003    no obstructive disease    Past Surgical History  Procedure Laterality Date  . Appendectomy    . Abdominal hysterectomy    . Tubal ligation    . [place] echocardiography  2007     Labs:  Recent Labs  08/20/13 2008 08/21/13 0101 08/21/13 0936 08/22/13 0505  HGB 14.1  --   --   --   HCT 41.8  --   --   --   PLT 338  --   --   --   LABPROT  --  30.4* 29.6* 27.4*  INR  --  3.04* 2.94* 2.66*  CREATININE 0.55  --   --   --    Estimated Creatinine Clearance: 70.3 ml/min (by C-G formula based on Cr of 0.55).  Current Medication[s] Include: Medication PTA: Medication Sig  . albuterol (PROAIR HFA) 108 (90 BASE) MCG/ACT inhaler Inhale 2 puffs into the lungs every 6 (six) hours as needed for wheezing or shortness of breath.  . budesonide-formoterol (SYMBICORT) 160-4.5 MCG/ACT inhaler Inhale 2 puffs into the lungs 2 (two) times daily.  . calcium-vitamin D (OSCAL WITH D 500-200) 500-200 MG-UNIT per tablet Take 2 tablets by mouth daily.   Marland Kitchen. esomeprazole (NEXIUM) 40 MG capsule Take 40 mg by mouth 2 (two) times daily before a meal.  . fluticasone (FLONASE) 50 MCG/ACT nasal spray Place 2 sprays into both nostrils daily.  . Glucosamine-Chondroit-Vit C-Mn (GLUCOSAMINE 1500 COMPLEX PO) Take 1 tablet by mouth daily.   Marland Kitchen. guaifenesin (ROBITUSSIN) 100 MG/5ML syrup Take 200 mg by mouth 3 (three) times daily as  needed for cough.  . levothyroxine (SYNTHROID, LEVOTHROID) 25 MCG tablet Take 25 mcg by mouth daily before breakfast.  . loratadine (CLARITIN) 10 MG tablet Take 10 mg by mouth daily.  . metFORMIN (GLUCOPHAGE-XR) 500 MG 24 hr tablet Take 500 mg by mouth daily with breakfast.  . montelukast (SINGULAIR) 10 MG tablet Take 1 tablet (10 mg total) by mouth at bedtime.  Marland Kitchen. olmesartan (BENICAR) 40 MG tablet Take 40 mg by mouth daily.  . rosuvastatin (CRESTOR) 10 MG tablet Take 5 mg by mouth daily.  Marland Kitchen. warfarin (COUMADIN) 5 MG tablet Take 5-7.5 mg by mouth daily. Take 1 tablet on Wednesday then take 1 and 1/2 tablets every other day   Scheduled:  Scheduled:  . albuterol  2.5 mg Nebulization QID  . azithromycin  500 mg Oral Daily  . budesonide-formoterol  2 puff Inhalation BID  .  dextromethorphan-guaiFENesin  1 tablet Oral BID  . fluticasone  2 spray Each Nare Daily  . levothyroxine  25 mcg Oral QAC breakfast  . loratadine  10 mg Oral Daily  . metFORMIN  500 mg Oral Q breakfast  . metroNIDAZOLE  500 mg Oral 3 times per day  . montelukast  10 mg Oral QHS  . pantoprazole  80 mg Oral Q1200  . predniSONE  60 mg Oral BID WC  . rosuvastatin  5 mg Oral q1800  . sodium chloride  3 mL Intravenous Q12H  . warfarin  5 mg Oral ONCE-1800  . warfarin  1 each Does not apply Once  . Warfarin - Pharmacist Dosing Inpatient   Does not apply q1800   Infusion[s]: Infusions:  None Antibiotic[s]: Anti-infectives   Start     Dose/Rate Route Frequency Ordered Stop   08/21/13 1500  azithromycin (ZITHROMAX) tablet 500 mg     500 mg Oral Daily 08/21/13 1355     08/21/13 1500  metroNIDAZOLE (FLAGYL) tablet 500 mg     500 mg Oral 3 times per day 08/21/13 1355           Goals:  Target INR of 2-3.  PLAN:  1. Will give Coumadin 5 mg po today.   2. Daily INR's, CBC.  Monitor for bleeding complications. Follow platelet counts.  Leota SauersLisa Diya Gervasi Pharm.D. CPP, BCPS Clinical Pharmacist [phone] 08/22/2013 8:59 AM

## 2013-08-23 ENCOUNTER — Observation Stay (HOSPITAL_COMMUNITY): Payer: Medicare Other

## 2013-08-23 DIAGNOSIS — J209 Acute bronchitis, unspecified: Secondary | ICD-10-CM | POA: Diagnosis not present

## 2013-08-23 DIAGNOSIS — J45909 Unspecified asthma, uncomplicated: Secondary | ICD-10-CM

## 2013-08-23 DIAGNOSIS — I4891 Unspecified atrial fibrillation: Secondary | ICD-10-CM

## 2013-08-23 DIAGNOSIS — K219 Gastro-esophageal reflux disease without esophagitis: Secondary | ICD-10-CM

## 2013-08-23 DIAGNOSIS — J309 Allergic rhinitis, unspecified: Secondary | ICD-10-CM

## 2013-08-23 DIAGNOSIS — J9819 Other pulmonary collapse: Secondary | ICD-10-CM | POA: Diagnosis not present

## 2013-08-23 DIAGNOSIS — J9600 Acute respiratory failure, unspecified whether with hypoxia or hypercapnia: Secondary | ICD-10-CM | POA: Diagnosis not present

## 2013-08-23 DIAGNOSIS — J019 Acute sinusitis, unspecified: Secondary | ICD-10-CM

## 2013-08-23 DIAGNOSIS — R058 Other specified cough: Secondary | ICD-10-CM

## 2013-08-23 DIAGNOSIS — J387 Other diseases of larynx: Secondary | ICD-10-CM

## 2013-08-23 DIAGNOSIS — I519 Heart disease, unspecified: Secondary | ICD-10-CM

## 2013-08-23 DIAGNOSIS — J45902 Unspecified asthma with status asthmaticus: Secondary | ICD-10-CM | POA: Diagnosis not present

## 2013-08-23 DIAGNOSIS — R05 Cough: Secondary | ICD-10-CM

## 2013-08-23 DIAGNOSIS — R059 Cough, unspecified: Secondary | ICD-10-CM

## 2013-08-23 LAB — GLUCOSE, CAPILLARY
GLUCOSE-CAPILLARY: 177 mg/dL — AB (ref 70–99)
GLUCOSE-CAPILLARY: 140 mg/dL — AB (ref 70–99)
Glucose-Capillary: 143 mg/dL — ABNORMAL HIGH (ref 70–99)
Glucose-Capillary: 142 mg/dL — ABNORMAL HIGH (ref 70–99)

## 2013-08-23 LAB — PRO B NATRIURETIC PEPTIDE: PRO B NATRI PEPTIDE: 122.3 pg/mL (ref 0–125)

## 2013-08-23 LAB — PROTIME-INR
INR: 2.61 — ABNORMAL HIGH (ref 0.00–1.49)
Prothrombin Time: 27 seconds — ABNORMAL HIGH (ref 11.6–15.2)

## 2013-08-23 MED ORDER — PANTOPRAZOLE SODIUM 40 MG PO TBEC
80.0000 mg | DELAYED_RELEASE_TABLET | Freq: Two times a day (BID) | ORAL | Status: DC
  Administered 2013-08-23 – 2013-08-26 (×6): 80 mg via ORAL
  Filled 2013-08-23 (×6): qty 2

## 2013-08-23 MED ORDER — BUDESONIDE 0.5 MG/2ML IN SUSP
0.5000 mg | Freq: Two times a day (BID) | RESPIRATORY_TRACT | Status: DC
  Administered 2013-08-23 – 2013-08-26 (×6): 0.5 mg via RESPIRATORY_TRACT
  Filled 2013-08-23 (×8): qty 2

## 2013-08-23 MED ORDER — OXYMETAZOLINE HCL 0.05 % NA SOLN
2.0000 | Freq: Two times a day (BID) | NASAL | Status: AC
  Administered 2013-08-23 – 2013-08-25 (×4): 2 via NASAL
  Filled 2013-08-23: qty 15

## 2013-08-23 MED ORDER — SALINE SPRAY 0.65 % NA SOLN
2.0000 | Freq: Four times a day (QID) | NASAL | Status: DC
  Administered 2013-08-23 – 2013-08-26 (×11): 2 via NASAL
  Filled 2013-08-23: qty 44

## 2013-08-23 MED ORDER — WARFARIN SODIUM 2.5 MG PO TABS
2.5000 mg | ORAL_TABLET | Freq: Once | ORAL | Status: AC
  Administered 2013-08-23: 2.5 mg via ORAL
  Filled 2013-08-23: qty 1

## 2013-08-23 MED ORDER — FLUTICASONE PROPIONATE 50 MCG/ACT NA SUSP
2.0000 | Freq: Two times a day (BID) | NASAL | Status: DC
  Administered 2013-08-23 – 2013-08-26 (×6): 2 via NASAL
  Filled 2013-08-23: qty 16

## 2013-08-23 MED ORDER — ARFORMOTEROL TARTRATE 15 MCG/2ML IN NEBU
15.0000 ug | INHALATION_SOLUTION | Freq: Two times a day (BID) | RESPIRATORY_TRACT | Status: DC
Start: 2013-08-23 — End: 2013-08-26
  Administered 2013-08-23 – 2013-08-26 (×6): 15 ug via RESPIRATORY_TRACT
  Filled 2013-08-23 (×8): qty 2

## 2013-08-23 NOTE — Progress Notes (Signed)
Pharmacy Note-Anticoagulation  Pharmacy Consult :  73 y.o. female is currently on Chronic Coumadin for atrial fibrillation .   Latest Labs : Hematology :  Recent Labs  08/20/13 2008 08/21/13 0101 08/21/13 0936 08/22/13 0505 08/23/13 0420  HGB 14.1  --   --   --   --   HCT 41.8  --   --   --   --   PLT 338  --   --   --   --   LABPROT  --  30.4* 29.6* 27.4* 27.0*  INR  --  3.04* 2.94* 2.66* 2.61*  CREATININE 0.55  --   --   --   --     Current Medication[s] Include: Medication PTA: Prescriptions prior to admission  Medication Sig Dispense Refill  . albuterol (PROAIR HFA) 108 (90 BASE) MCG/ACT inhaler Inhale 2 puffs into the lungs every 6 (six) hours as needed for wheezing or shortness of breath.  8.5 each  11  . budesonide-formoterol (SYMBICORT) 160-4.5 MCG/ACT inhaler Inhale 2 puffs into the lungs 2 (two) times daily.  10.2 g  11  . calcium-vitamin D (OSCAL WITH D 500-200) 500-200 MG-UNIT per tablet Take 2 tablets by mouth daily.       Marland Kitchen. esomeprazole (NEXIUM) 40 MG capsule Take 40 mg by mouth 2 (two) times daily before a meal.      . fluticasone (FLONASE) 50 MCG/ACT nasal spray Place 2 sprays into both nostrils daily.      . Glucosamine-Chondroit-Vit C-Mn (GLUCOSAMINE 1500 COMPLEX PO) Take 1 tablet by mouth daily.       Marland Kitchen. guaifenesin (ROBITUSSIN) 100 MG/5ML syrup Take 200 mg by mouth 3 (three) times daily as needed for cough.      . levothyroxine (SYNTHROID, LEVOTHROID) 25 MCG tablet Take 25 mcg by mouth daily before breakfast.      . loratadine (CLARITIN) 10 MG tablet Take 10 mg by mouth daily.      . metFORMIN (GLUCOPHAGE-XR) 500 MG 24 hr tablet Take 500 mg by mouth daily with breakfast.      . montelukast (SINGULAIR) 10 MG tablet Take 1 tablet (10 mg total) by mouth at bedtime.  90 tablet  3  . olmesartan (BENICAR) 40 MG tablet Take 40 mg by mouth daily.      . rosuvastatin (CRESTOR) 10 MG tablet Take 5 mg by mouth daily.      Marland Kitchen. warfarin (COUMADIN) 5 MG tablet Take 5-7.5 mg  by mouth daily. Take 1 tablet on Wednesday then take 1 and 1/2 tablets every other day       Scheduled:  Scheduled:  . arformoterol  15 mcg Nebulization BID  . azithromycin  500 mg Oral Daily  . budesonide (PULMICORT) nebulizer solution  0.5 mg Nebulization BID  . dextromethorphan-guaiFENesin  1 tablet Oral BID  . fluticasone  2 spray Each Nare BID  . levothyroxine  25 mcg Oral QAC breakfast  . loratadine  10 mg Oral Daily  . metFORMIN  500 mg Oral Q breakfast  . metroNIDAZOLE  500 mg Oral 3 times per day  . montelukast  10 mg Oral QHS  . oxymetazoline  2 spray Each Nare BID  . pantoprazole  80 mg Oral BID  . predniSONE  60 mg Oral BID WC  . rosuvastatin  5 mg Oral q1800  . sodium chloride  2 spray Each Nare QID  . sodium chloride  3 mL Intravenous Q12H  . warfarin  1 each Does not apply  Once  . Warfarin - Pharmacist Dosing Inpatient   Does not apply q1800   Infusion[s]: Infusions:   Antibiotic[s]: Anti-infectives   Start     Dose/Rate Route Frequency Ordered Stop   08/21/13 1500  azithromycin (ZITHROMAX) tablet 500 mg     500 mg Oral Daily 08/21/13 1355     08/21/13 1500  metroNIDAZOLE (FLAGYL) tablet 500 mg     500 mg Oral 3 times per day 08/21/13 1355        Assessment :  Today's INR 2.61.   INR remains within therapeutic range..    Day # 3 of Flagyl + Azithromycin.  Flagyl is a powerful INR potentiator combined with Coumadin.  It is likely that patient will require smaller doses on Coumadin until antibiotics discontinued  No evidence of bleeding complications observed.  Goal :  INR goal is 2-3.  Plan : 1. Reduce today's Coumadin to 2.5 mg in anticipation of Flagyl's effect on INR. 2. Daily INR's, CBC. Monitor for bleeding complications.   Follow Platelet counts.  Laurena BeringEarle J Aarion Metzgar, Pharm.D. 08/23/2013  3:43 PM

## 2013-08-23 NOTE — Consult Note (Signed)
PULMONARY / CRITICAL CARE MEDICINE Name: [name] [name] MRN: [id number] DOB: 10/31/1940    ADMISSION DATE:  08/20/2013 CONSULTATION DATE:  08/23/2013  REFERRING MD :  Lendell CapriceSullivan  PRIMARY SERVICE:  TRH  CHIEF COMPLAINT:  Bronchospasm   BRIEF PATIENT DESCRIPTION: 73 year old female with mild intermittent asthma (RB), allergic rhinitis, upper airway cough syndrome and GERD with vocal cord symptoms presented on 4/28 with increased productive cough and dyspnea.  SIGNIFICANT EVENTS / STUDIES:   LINES / TUBES:  CULTURES:  ANTIBIOTICS: Azithromycin 4/29 >>> Flagyl 4/29 >>>  HISTORY OF PRESENT ILLNESS:    73 y.o. female who presented to the ED on 4/28 with 3 day history of worsening SOB and wheezing. She was concerned that this was a  typical asthma exacerbation for her, as she  typically has troubles with asthma around this time of year (maple pollen is the trigger for her). Was admitted by the medical service for further evaluation and therapy. Initial CXR was w/out infiltrate. She was treated in the usual fashion w/ scheduled BDs, systemic steroids, and empiric antibiotics. PCCM was asked to see on 5/1 because in spite of these interventions she continues to c/o dyspnea and wheeze w/out sig improvement from time of admission   PAST MEDICAL HISTORY :  Past Medical History  Diagnosis Date  . Migraine, unspecified, without mention of intractable migraine without mention of status migrainosus   . HLD (hyperlipidemia)   . Aortic insufficiency   . Colon polyps   . Hypothyroidism   . GERD (gastroesophageal reflux disease)   . Osteoarthritis   . HTN (hypertension)   . Depression   . PAF (paroxysmal atrial fibrillation)     on coumadin and Cardiazem  . Asthma   . Allergic rhinitis   . Chronic anticoagulation     on coumadin  . Obesity   . Diastolic dysfunction   . S/P cardiac cath 2003    no obstructive disease    Past Surgical History  Procedure Laterality Date  . Appendectomy     . Abdominal hysterectomy    . Tubal ligation    . [place] echocardiography  2007   Prior to Admission medications   Medication Sig Start Date End Date Taking? Authorizing Provider  albuterol (PROAIR HFA) 108 (90 BASE) MCG/ACT inhaler Inhale 2 puffs into the lungs every 6 (six) hours as needed for wheezing or shortness of breath. 06/04/13  Yes Etta Grandchildhomas L Jones, MD  budesonide-formoterol [facility](SYMBICORT) 160-4.5 MCG/ACT inhaler Inhale 2 puffs into the lungs 2 (two) times daily. 06/04/13  Yes Etta Grandchildhomas L Jones, MD  calcium-vitamin D (OSCAL WITH D 500-200) 500-200 MG-UNIT per tablet Take 2 tablets by mouth daily.    Yes Historical Provider, MD  esomeprazole (NEXIUM) 40 MG capsule Take 40 mg by mouth 2 (two) times daily before a meal.   Yes Historical Provider, MD  fluticasone (FLONASE) 50 MCG/ACT nasal spray Place 2 sprays into both nostrils daily.   Yes Historical Provider, MD  Glucosamine-Chondroit-Vit C-Mn (GLUCOSAMINE 1500 COMPLEX PO) Take 1 tablet by mouth daily.    Yes Historical Provider, MD  guaifenesin (ROBITUSSIN) 100 MG/5ML syrup Take 200 mg by mouth 3 (three) times daily as needed for cough.   Yes Historical Provider, MD  levothyroxine (SYNTHROID, LEVOTHROID) 25 MCG tablet Take 25 mcg by mouth daily before breakfast.   Yes Historical Provider, MD  loratadine (CLARITIN) 10 MG tablet Take 10 mg by mouth daily.   Yes Historical Provider, MD  metFORMIN (GLUCOPHAGE-XR) 500 MG 24  hr tablet Take 500 mg by mouth daily with breakfast.   Yes Historical Provider, MD  montelukast (SINGULAIR) 10 MG tablet Take 1 tablet (10 mg total) by mouth at bedtime. 06/04/13  Yes Etta Grandchildhomas L Jones, MD  olmesartan (BENICAR) 40 MG tablet Take 40 mg by mouth daily.   Yes Historical Provider, MD  rosuvastatin (CRESTOR) 10 MG tablet Take 5 mg by mouth daily.   Yes Historical Provider, MD  warfarin (COUMADIN) 5 MG tablet Take 5-7.5 mg by mouth daily. Take 1 tablet on Wednesday then take 1 and 1/2 tablets every other day   Yes Historical  Provider, MD   Allergies  Allergen Reactions  . Penicillins Hives  . Tetracycline Swelling    Swollen tongue  . Cardizem [Diltiazem Hcl]     edema  . Acetaminophen   . Azithromycin   . Cefaclor   . Cephalexin   . Clindamycin   . Ibuprofen   . Nsaids     Respiratory-Asthma  . Pravastatin Sodium   . Simvastatin   . Sulfonamide Derivatives   . Avelox [Moxifloxacin Hcl In Nacl] Rash  . Levaquin [Levofloxacin In D5w] Rash   FAMILY HISTORY:  Family History  Problem Relation Age of Onset  . Heart disease    . Hypertension    . COPD    . Crohn's disease    . Diabetes    . Depression    . Dementia     SOCIAL HISTORY:  reports that she has never smoked. She has never used smokeless tobacco. She reports that she drinks about 3 ounces of alcohol per week. She reports that she does not use illicit drugs.  REVIEW OF SYSTEMS:   Bolds are positive  Constitutional: weight loss, gain, night sweats, Fevers, now improved chills, fatigue .  HEENT: headaches, sore throat, sneezing, nasal congestion, post nasal drip, + change in phonation, Difficulty swallowing, Tooth/dental problems, visual complaints visual changes, ear ache CV:  chest pain, radiates: ,Orthopnea, PND, swelling in lower extremities, dizziness, palpitations, syncope.  GI  heartburn, indigestion, abdominal pain, nausea, vomiting, diarrhea, change in bowel habits, loss of appetite, bloody stools.  Resp: cough, productive: productive at times, green to yellow: , hemoptysis, dyspnea, chest pain, pleuritic.  Skin: rash or itching or icterus GU: dysuria, change in color of urine, urgency or frequency. flank pain, hematuria  MS: joint pain or swelling. decreased range of motion  Psych: change in mood or affect. depression or anxiety.  Neuro: difficulty with speech, weakness, numbness, ataxia   INTERVAL HISTORY:   VITAL SIGNS: Temp:  [97.5 F (36.4 C)-98.4 F (36.9 C)] 98.2 F (36.8 C) (05/01 1330) Pulse Rate:  [69-87] 87  (05/01 1330) Resp:  [18] 18 (05/01 1330) BP: (119-162)/(52-69) 145/52 mmHg (05/01 1330) SpO2:  [95 %-98 %] 95 % (05/01 1330)  PHYSICAL EXAMINATION: General:  73 year old female in no acute distress Neuro:  Awake, oriented, no focal def  HEENT:  [place], No JVD, + upper airway wheeze, posterior pharnyx erythremic, phonation hoarse Cardiovascular:  rrr Lungs:  Exp wheeze, w/o accessory muscle use  Abdomen:  Sof, + bowel sounds  Musculoskeletal:  Intact  Skin:  Intact   LABS: CBC  Recent Labs Lab 08/20/13 2008  WBC 9.7  HGB 14.1  HCT 41.8  PLT 338   Coag's  Recent Labs Lab 08/21/13 0936 08/22/13 0505 08/23/13 0420  INR 2.94* 2.66* 2.61*   BMET  Recent Labs Lab 08/20/13 2008  NA 136*  K 4.0  CL  96  CO2 26  BUN 9  CREATININE 0.55  GLUCOSE 133*   Electrolytes  Recent Labs Lab 08/20/13 2008  CALCIUM 9.1   Sepsis Markers No results found for this basename: LATICACIDVEN, PROCALCITON, O2SATVEN,  in the last 168 hours ABG No results found for this basename: PHART, PCO2ART, PO2ART,  in the last 168 hours Liver Enzymes  Recent Labs Lab 08/20/13 2008  AST 19  ALT 16  ALKPHOS 77  BILITOT 0.3  ALBUMIN 3.7   Cardiac Enzymes  Recent Labs Lab 08/20/13 2009  PROBNP 172.8*   Glucose  Recent Labs Lab 08/22/13 0558 08/22/13 1126 08/22/13 1611 08/22/13 2040 08/23/13 0619 08/23/13 1114  GLUCAP 118* 136* 154* 149* 177* 140*   IMAGING:  No results found.  ASSESSMENT / PLAN:  Mild intermittent asthma, with exacerbation Allergic rhinitis  Acute bronchitis  Acute sinusitis  GERD LPR (laryngopharyngeal reflux ) Upper airway cough syndrome   Supplemental oxygen for SpO2>92  Change Symbicort to nebulized Brovana / Budesonide  Albuterol PRN  Prednisone  Claritin / Singulair  Increase Protonix to 80 bid  Add nasal hygiene regimen: nasal saline / Afrin / Flonase  Flutter valve  Would consider d/c abx if now leukocytosis and afebrile   Consult  appreciated, will follow  I have personally obtained history, examined patient, evaluated and interpreted laboratory and imaging results, reviewed medical records, formulated assessment / plan and placed orders.  Lonia Farber, MD Pulmonary and Critical Care Medicine [facility] Pager: [phone]  08/23/2013, 3:23 PM

## 2013-08-23 NOTE — Progress Notes (Signed)
08/23/2013 0820 Nursing note Night shift attempted to wean pt. To room air. Oxygen saturations this morning on room air 86%. 2L o2 Lexa reapplied. MD updated on findings. Will continue to monitor patient.  Blanchard KelchStephanie Ingold Kaili Castille

## 2013-08-23 NOTE — Progress Notes (Signed)
[facility] PROGRESS NOTE  [name] [name] ZOX:[id number] DOB: 03-26-1941 DOA: 08/20/2013 PCP: Sanda Lingerhomas Jones, MD  Assessment/Plan:  Principal Problem:   Status asthmaticus: Really not much better than on admission. Sees Dr. Delton CoombesByrum. Will consult pulmonary medicine to assist. Continue current antibiotics, steroids, bronchodilators and Flonase.  Multiple drug allergies: Currently no diffuse rash or other symptoms on azithromycin and Flagyl.   HYPERTENSION   Type II or unspecified type diabetes mellitus: Controlled   Rash on feet: Not typical of drug rash. Monitor.   Acute bronchitis   Acute sinusitis   Acute respiratory failure: continue oxygen Paroxysmal atrial correlation: On Coumadin  Code Status:  full Family Communication:  Husband 4/29 Disposition Plan:  home  HPI/Subjective: Tired, still DOE and wheezing. Coughing up green. No orthopnea.  Objective: Filed Vitals:   08/23/13 1330  BP: 145/52  Pulse: 87  Temp: 98.2 F (36.8 C)  Resp: 18    Intake/Output Summary (Last 24 hours) at 08/23/13 1351 Last data filed at 08/23/13 1230  Gross per 24 hour  Intake    480 ml  Output      0 ml  Net    480 ml   Filed Weights   08/21/13 0138  Weight: 91.581 kg (201 lb 14.4 oz)    Exam:   General:  Weak appearing  Cardiovascular: RRR without MGR  Respiratory: bilateral wheeze. Moderate air movement  Abdomen: S, NT, ND  Ext: minimal pedal edema.  Erythematous papules both feet today.  Basic Metabolic Panel:  Recent Labs Lab 08/20/13 2008  NA 136*  K 4.0  CL 96  CO2 26  GLUCOSE 133*  BUN 9  CREATININE 0.55  CALCIUM 9.1   Liver Function Tests:  Recent Labs Lab 08/20/13 2008  AST 19  ALT 16  ALKPHOS 77  BILITOT 0.3  PROT 7.7  ALBUMIN 3.7   No results found for this basename: LIPASE, AMYLASE,  in the last 168 hours No results found for this basename: AMMONIA,  in the last 168 hours CBC:  Recent Labs Lab 08/20/13 2008  WBC 9.7   NEUTROABS 7.0  HGB 14.1  HCT 41.8  MCV 91.5  PLT 338   Cardiac Enzymes: No results found for this basename: CKTOTAL, CKMB, CKMBINDEX, TROPONINI,  in the last 168 hours BNP (last 3 results)  Recent Labs  08/20/13 2009  PROBNP 172.8*   CBG:  Recent Labs Lab 08/22/13 1126 08/22/13 1611 08/22/13 2040 08/23/13 0619 08/23/13 1114  GLUCAP 136* 154* 149* 177* 140*    No results found for this or any previous visit (from the past 240 hour(s)).   Studies: No results found.  Scheduled Meds: . albuterol  2.5 mg Nebulization QID  . azithromycin  500 mg Oral Daily  . budesonide-formoterol  2 puff Inhalation BID  . dextromethorphan-guaiFENesin  1 tablet Oral BID  . fluticasone  2 spray Each Nare Daily  . levothyroxine  25 mcg Oral QAC breakfast  . loratadine  10 mg Oral Daily  . metFORMIN  500 mg Oral Q breakfast  . metroNIDAZOLE  500 mg Oral 3 times per day  . montelukast  10 mg Oral QHS  . pantoprazole  80 mg Oral Q1200  . predniSONE  60 mg Oral BID WC  . rosuvastatin  5 mg Oral q1800  . sodium chloride  3 mL Intravenous Q12H  . warfarin  1 each Does not apply Once  . Warfarin - Pharmacist Dosing Inpatient   Does not apply [phone]  Continuous Infusions:   Time spent: 25 minutes  Christiane Haorinna L Doloris Servantes, MD  [facility] Pager [phone]. If 7PM-7AM, please contact night-coverage at [email or web address], password [email or web address] 08/23/2013, 1:51 PM  LOS: 3 days

## 2013-08-24 DIAGNOSIS — J9600 Acute respiratory failure, unspecified whether with hypoxia or hypercapnia: Secondary | ICD-10-CM | POA: Diagnosis not present

## 2013-08-24 DIAGNOSIS — J45902 Unspecified asthma with status asthmaticus: Secondary | ICD-10-CM | POA: Diagnosis not present

## 2013-08-24 DIAGNOSIS — J309 Allergic rhinitis, unspecified: Secondary | ICD-10-CM | POA: Diagnosis not present

## 2013-08-24 DIAGNOSIS — J387 Other diseases of larynx: Secondary | ICD-10-CM | POA: Diagnosis not present

## 2013-08-24 LAB — GLUCOSE, CAPILLARY
GLUCOSE-CAPILLARY: 111 mg/dL — AB (ref 70–99)
GLUCOSE-CAPILLARY: 206 mg/dL — AB (ref 70–99)
Glucose-Capillary: 143 mg/dL — ABNORMAL HIGH (ref 70–99)
Glucose-Capillary: 154 mg/dL — ABNORMAL HIGH (ref 70–99)

## 2013-08-24 LAB — PROTIME-INR
INR: 2.94 — ABNORMAL HIGH (ref 0.00–1.49)
Prothrombin Time: 29.6 seconds — ABNORMAL HIGH (ref 11.6–15.2)

## 2013-08-24 MED ORDER — TRAMADOL HCL 50 MG PO TABS: 50.0000 mg | ORAL_TABLET | ORAL | Status: DC | PRN

## 2013-08-24 MED ORDER — WARFARIN SODIUM 2.5 MG PO TABS
2.5000 mg | ORAL_TABLET | Freq: Once | ORAL | Status: AC
  Administered 2013-08-24: 2.5 mg via ORAL
  Filled 2013-08-24: qty 1

## 2013-08-24 MED ORDER — IRBESARTAN 75 MG PO TABS
75.0000 mg | ORAL_TABLET | Freq: Every day | ORAL | Status: DC
  Administered 2013-08-24 – 2013-08-26 (×3): 75 mg via ORAL
  Filled 2013-08-24 (×4): qty 1

## 2013-08-24 MED ORDER — DM-GUAIFENESIN ER 30-600 MG PO TB12
2.0000 | ORAL_TABLET | Freq: Two times a day (BID) | ORAL | Status: DC
  Filled 2013-08-24: qty 2

## 2013-08-24 MED ORDER — DM-GUAIFENESIN ER 30-600 MG PO TB12
2.0000 | ORAL_TABLET | Freq: Two times a day (BID) | ORAL | Status: DC
  Administered 2013-08-24 – 2013-08-26 (×5): 2 via ORAL
  Filled 2013-08-24 (×6): qty 2

## 2013-08-24 NOTE — Progress Notes (Signed)
ANTICOAGULATION CONSULT NOTE - Follow Up Consult  Pharmacy Consult for Coumadin Indication: atrial fibrillation  Allergies  Allergen Reactions  . Penicillins Hives  . Tetracycline Swelling    Swollen tongue  . Cardizem [Diltiazem Hcl]     edema  . Acetaminophen   . Azithromycin   . Cefaclor   . Cephalexin   . Clindamycin   . Ibuprofen   . Nsaids     Respiratory-Asthma  . Pravastatin Sodium   . Simvastatin   . Sulfonamide Derivatives   . Avelox [Moxifloxacin Hcl In Nacl] Rash  . Levaquin [Levofloxacin In D5w] Rash    Patient Measurements: Height: 5\' 6"  (167.6 cm) Weight: 201 lb 14.4 oz (91.581 kg) IBW/kg (Calculated) : 59.3   Vital Signs: Temp: 97.5 F (36.4 C) (05/02 0605) Temp src: Oral (05/02 0605) BP: 160/71 mmHg (05/02 0605) Pulse Rate: 63 (05/02 0605)  Labs:  Recent Labs  08/22/13 0505 08/23/13 0420 08/24/13 0418  LABPROT 27.4* 27.0* 29.6*  INR 2.66* 2.61* 2.94*    Estimated Creatinine Clearance: 70.3 ml/min (by C-G formula based on Cr of 0.55).   Assessment: [name] [name] continued on coumadin for afib. INR 2.94, trending toward high-end goal. S/p 3 days of azithromycin and flagyl, which could decrease coumadin metabolism. Now the antibiotics has been stopped. No new cbc, no bleeding noted per chart.  PTA dose: 7.5mg  daily except for Wednesday takes 5mg  per coumadin clinic note on 4/24  Goal of Therapy:  INR 2-3 Monitor platelets by anticoagulation protocol: Yes   Plan:  - Coumadin 2.5 mg po x 1 - Monitor daily INR  Bayard HuggerMei Jerald Villalona, PharmD, BCPS  Clinical Pharmacist  Pager: [phone]   08/24/2013,11:44 AM

## 2013-08-24 NOTE — Progress Notes (Addendum)
[facility] PROGRESS NOTE  [name] PolingMargaret M [name] WUJ:[id number] DOB: [birth year] DOA: 08/20/2013 PCP: Sanda Lingerhomas Jones, MD  Assessment/Plan:  Principal Problem:   Status asthmaticus: Patient feeling better this am, will stop antibiotics as she remains afebrile. Continue oral prednisone, nebs, supportive care Multiple drug allergies: Stable   HYPERTENSION -Will start Avapro 75 mg PO q daily   Type II or unspecified type diabetes mellitus: Controlled   Rash on feet: Not typical of drug rash. Monitor.   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 Tolerating PO, had small BM yesterday.   Objective: Filed Vitals:   08/24/13 0605  BP: 160/71  Pulse: 63  Temp: 97.5 F (36.4 C)  Resp: 18    Intake/Output Summary (Last 24 hours) at 08/24/13 0901 Last data filed at 08/24/13 0730  Gross per 24 hour  Intake   1080 ml  Output      0 ml  Net   1080 ml   Filed Weights   08/21/13 0138  Weight: 91.581 kg (201 lb 14.4 oz)    Exam:   General:  Weak appearing  Cardiovascular: RRR without MGR  Respiratory: bilateral wheeze. Moderate air movement  Abdomen: S, NT, ND  Ext: minimal pedal edema.  Erythematous papules both feet today.  Basic Metabolic Panel:  Recent Labs Lab 08/20/13 2008  NA 136*  K 4.0  CL 96  CO2 26  GLUCOSE 133*  BUN 9  CREATININE 0.55  CALCIUM 9.1   Liver Function Tests:  Recent Labs Lab 08/20/13 2008  AST 19  ALT 16  ALKPHOS 77  BILITOT 0.3  PROT 7.7  ALBUMIN 3.7   No results found for this basename: LIPASE, AMYLASE,  in the last 168 hours No results found for this basename: AMMONIA,  in the last 168 hours CBC:  Recent Labs Lab 08/20/13 2008  WBC 9.7  NEUTROABS 7.0  HGB 14.1  HCT 41.8  MCV 91.5  PLT 338    Cardiac Enzymes: No results found for this basename: CKTOTAL, CKMB, CKMBINDEX, TROPONINI,  in the last 168 hours BNP (last 3 results)  Recent Labs  08/20/13 2009 08/23/13 1525  PROBNP 172.8* 122.3   CBG:  Recent Labs Lab 08/23/13 0619 08/23/13 1114 08/23/13 1623 08/23/13 2126 08/24/13 0600  GLUCAP 177* 140* 143* 142* 143*    No results found for this or any previous visit (from the past 240 hour(s)).   Studies: Dg Chest Port 1 View  08/23/2013   CLINICAL DATA:  Infiltrates, shortness of breath for several days, history of hypertension, nonsmoker  EXAM: PORTABLE CHEST - 1 VIEW  COMPARISON:  DG CHEST 2 VIEW dated 08/20/2013; DG CHEST 2 VIEW dated 06/17/2011; DG CHEST 2 VIEW dated 11/24/2007  FINDINGS: Examination is degraded due to patient body habitus and portable technique.  Grossly unchanged cardiac silhouette and mediastinal contours with slight differences attributable to decreased lung volumes and AP projection. Slight worsening of the bibasilar heterogeneous opacities, left greater than right, likely atelectasis. No discrete focal airspace opacities. No pleural effusion or pneumothorax. No evidence of edema. No acute osseus abnormalities.  IMPRESSION: Minimal bibasilar atelectasis without acute cardiopulmonary disease on this hypoventilated AP portable examination. Further evaluation with a PA and lateral chest radiograph may be obtained as clinically indicated.   Electronically Signed   By: Jonny RuizJohn  Watts M.D.   On: 08/23/2013 16:51    Scheduled Meds: . arformoterol  15 mcg Nebulization BID  . budesonide (PULMICORT) nebulizer solution  0.5 mg Nebulization BID  . dextromethorphan-guaiFENesin  1 tablet Oral BID  . fluticasone  2 spray Each Nare BID  . levothyroxine  25 mcg Oral QAC breakfast  . loratadine  10 mg Oral Daily  . metFORMIN  500 mg Oral Q breakfast  . montelukast  10 mg Oral QHS  . oxymetazoline  2 spray Each Nare BID  . pantoprazole  80 mg Oral BID  . predniSONE   60 mg Oral BID WC  . rosuvastatin  5 mg Oral q1800  . sodium chloride  2 spray Each Nare QID  . sodium chloride  3 mL Intravenous Q12H  . warfarin  1 each Does not apply Once  . Warfarin - Pharmacist Dosing Inpatient   Does not apply q1800   Continuous Infusions:   Time spent: 25 minutes  Jeralyn BennettEzequiel Theon Sobotka, MD  [facility] Pager [phone]. If 7PM-7AM, please contact night-coverage at [email or web address], password [email or web address] 08/24/2013, 9:01 AM  LOS: 4 days

## 2013-08-24 NOTE — Progress Notes (Signed)
PULMONARY / CRITICAL CARE MEDICINE Name: [name] [name] MRN: [id number] DOB: Sep 12, 1940    ADMISSION DATE:  08/20/2013 CONSULTATION DATE:  08/23/2013  REFERRING MD :  Lendell CapriceSullivan  PRIMARY SERVICE:  TRH  CHIEF COMPLAINT:  SOB  BRIEF PATIENT DESCRIPTION: 73 year old female with mild intermittent asthma (RB), allergic rhinitis, upper airway cough syndrome and GERD with vocal cord symptoms presented on 4/28 with increased productive cough and dyspnea.  SIGNIFICANT EVENTS / STUDIES:   LINES / TUBES:  CULTURES:  ANTIBIOTICS: Azithromycin 4/29 > 5/1 Flagyl 4/29 > 5/1    INTERVAL HISTORY/ SUBJ:  Better, still harsh cough/ dry   VITAL SIGNS: Temp:  [97.5 F (36.4 C)-98.4 F (36.9 C)] 98.2 F (36.8 C) (05/01 1330) Pulse Rate:  [69-87] 87 (05/01 1330) Resp:  [18] 18 (05/01 1330) BP: (119-162)/(52-69) 145/52 mmHg (05/01 1330) SpO2:  [95 %-98 %] 95 % (05/01 1330) FIO2  3lpm NP  PHYSICAL EXAMINATION: General:  Elderly wf  in no acute distress Neuro:  Awake, oriented, no focal def  HEENT:  [place], No JVD, + upper airway wheeze, posterior pharnyx erythremic, phonation hoarse Cardiovascular:  rrr Lungs:  Min Exp wheeze, w/o accessory muscle use  Abdomen:  Sof, + bowel sounds  Musculoskeletal:  Intact  Skin:  Intact   LABS: CBC  Recent Labs Lab 08/20/13 2008  WBC 9.7  HGB 14.1  HCT 41.8  PLT 338   Coag's  Recent Labs Lab 08/21/13 0936 08/22/13 0505 08/23/13 0420  INR 2.94* 2.66* 2.61*   BMET  Recent Labs Lab 08/20/13 2008  NA 136*  K 4.0  CL 96  CO2 26  BUN 9  CREATININE 0.55  GLUCOSE 133*   Electrolytes  Recent Labs Lab 08/20/13 2008  CALCIUM 9.1   Sepsis Markers No results found for this basename: LATICACIDVEN, PROCALCITON, O2SATVEN,  in the last 168 hours ABG No results found for this basename: PHART, PCO2ART, PO2ART,  in the last 168 hours Liver Enzymes  Recent Labs Lab 08/20/13 2008  AST 19  ALT 16  ALKPHOS 77  BILITOT 0.3  ALBUMIN  3.7   Cardiac Enzymes  Recent Labs Lab 08/20/13 2009  PROBNP 172.8*   Glucose  Recent Labs Lab 08/22/13 0558 08/22/13 1126 08/22/13 1611 08/22/13 2040 08/23/13 0619 08/23/13 1114  GLUCAP 118* 136* 154* 149* 177* 140*   IMAGING:  No results found.  ASSESSMENT / PLAN:  Mild intermittent asthma, with exacerbation Allergic rhinitis  Acute bronchitis  Acute sinusitis  GERD LPR (laryngopharyngeal reflux ) Upper airway cough syndrome   Supplemental oxygen for SpO2>92  Continue  nebulized Brovana / Budesonide  Albuterol PRN  Prednisone taper  Claritin / Singulair  Increase Protonix to 80 bid  Add nasal hygiene regimen: nasal saline / Afrin / Flonase  Flutter valve             Suppress excess cough with tramadol prn     Sandrea HughsMichael Yoona Ishii, MD Pulmonary and Critical Care Medicine [facility] Cell [phone] After 5:30 PM or weekends, call [phone]

## 2013-08-25 ENCOUNTER — Inpatient Hospital Stay (HOSPITAL_COMMUNITY): Payer: Medicare Other

## 2013-08-25 DIAGNOSIS — J45902 Unspecified asthma with status asthmaticus: Secondary | ICD-10-CM | POA: Diagnosis not present

## 2013-08-25 DIAGNOSIS — R05 Cough: Secondary | ICD-10-CM | POA: Diagnosis not present

## 2013-08-25 DIAGNOSIS — J019 Acute sinusitis, unspecified: Secondary | ICD-10-CM | POA: Diagnosis not present

## 2013-08-25 DIAGNOSIS — J40 Bronchitis, not specified as acute or chronic: Secondary | ICD-10-CM | POA: Diagnosis not present

## 2013-08-25 DIAGNOSIS — J209 Acute bronchitis, unspecified: Secondary | ICD-10-CM | POA: Diagnosis not present

## 2013-08-25 DIAGNOSIS — J0100 Acute maxillary sinusitis, unspecified: Secondary | ICD-10-CM | POA: Diagnosis not present

## 2013-08-25 DIAGNOSIS — I4891 Unspecified atrial fibrillation: Secondary | ICD-10-CM | POA: Diagnosis not present

## 2013-08-25 DIAGNOSIS — J387 Other diseases of larynx: Secondary | ICD-10-CM | POA: Diagnosis not present

## 2013-08-25 LAB — GLUCOSE, CAPILLARY
GLUCOSE-CAPILLARY: 104 mg/dL — AB (ref 70–99)
Glucose-Capillary: 115 mg/dL — ABNORMAL HIGH (ref 70–99)
Glucose-Capillary: 163 mg/dL — ABNORMAL HIGH (ref 70–99)
Glucose-Capillary: 166 mg/dL — ABNORMAL HIGH (ref 70–99)

## 2013-08-25 LAB — BASIC METABOLIC PANEL
BUN: 19 mg/dL (ref 6–23)
CALCIUM: 8.7 mg/dL (ref 8.4–10.5)
CO2: 30 mEq/L (ref 19–32)
Chloride: 99 mEq/L (ref 96–112)
Creatinine, Ser: 0.68 mg/dL (ref 0.50–1.10)
GFR calc Af Amer: >90 mL/min (ref >90)
GFR, EST NON AFRICAN AMERICAN: 84 mL/min — AB (ref >90)
Glucose, Bld: 107 mg/dL — ABNORMAL HIGH (ref 70–99)
Potassium: 4.1 mEq/L (ref 3.7–5.3)
SODIUM: 138 meq/L (ref 137–147)

## 2013-08-25 LAB — CBC
HCT: 38.3 % (ref 36.0–46.0)
Hemoglobin: 12.7 g/dL (ref 12.0–15.0)
MCH: 30.2 pg (ref 26.0–34.0)
MCHC: 33.2 g/dL (ref 30.0–36.0)
MCV: 91.2 fL (ref 78.0–100.0)
PLATELETS: 337 10*3/uL (ref 150–400)
RBC: 4.2 MIL/uL (ref 3.87–5.11)
RDW: 14.9 % (ref 11.5–15.5)
WBC: 10.4 10*3/uL (ref 4.0–10.5)

## 2013-08-25 LAB — PROTIME-INR
INR: 3.11 — ABNORMAL HIGH (ref 0.00–1.49)
Prothrombin Time: 30.9 seconds — ABNORMAL HIGH (ref 11.6–15.2)

## 2013-08-25 MED ORDER — WARFARIN SODIUM 1 MG PO TABS
1.0000 mg | ORAL_TABLET | Freq: Once | ORAL | Status: AC
  Administered 2013-08-25: 1 mg via ORAL
  Filled 2013-08-25: qty 1

## 2013-08-25 NOTE — Progress Notes (Signed)
ANTICOAGULATION CONSULT NOTE - Follow Up Consult  Pharmacy Consult for Coumadin Indication: atrial fibrillation  Allergies  Allergen Reactions  . Penicillins Hives  . Tetracycline Swelling    Swollen tongue  . Cardizem [Diltiazem Hcl]     edema  . Acetaminophen   . Azithromycin   . Cefaclor   . Cephalexin   . Clindamycin   . Ibuprofen   . Nsaids     Respiratory-Asthma  . Pravastatin Sodium   . Simvastatin   . Sulfonamide Derivatives   . Avelox [Moxifloxacin Hcl In Nacl] Rash  . Levaquin [Levofloxacin In D5w] Rash    Patient Measurements: Height: 5\' 6"  (167.6 cm) Weight: 201 lb 14.4 oz (91.581 kg) IBW/kg (Calculated) : 59.3   Vital Signs: Temp: 98.1 F (36.7 C) (05/03 1257) Temp src: Oral (05/03 1257) BP: 169/66 mmHg (05/03 1257) Pulse Rate: 72 (05/03 1257)  Labs:  Recent Labs  08/23/13 0420 08/24/13 0418 08/25/13 0450  HGB  --   --  12.7  HCT  --   --  38.3  PLT  --   --  337  LABPROT 27.0* 29.6* 30.9*  INR 2.61* 2.94* 3.11*  CREATININE  --   --  0.68    Estimated Creatinine Clearance: 70.3 ml/min (by C-G formula based on Cr of 0.68).   Assessment: 72 YOF continued on coumadin for afib. INR 3.11, slightly above goal. S/p 3 days of azithromycin and flagyl, which could decrease coumadin metabolism. Now the antibiotics has been stopped. Hgb 12.7, plt 337, no bleeding noted per chart. She has been requiring lower than home dose since this admission.  PTA dose: 7.5mg  daily except for Wednesday takes 5mg  per coumadin clinic note on 4/24  Goal of Therapy:  INR 2-3 Monitor platelets by anticoagulation protocol: Yes   Plan:  - Coumadin 1 mg po x 1 - Monitor daily INR  Bayard HuggerMei Kyleeann Cremeans, PharmD, BCPS  Clinical Pharmacist  Pager: [phone]   08/25/2013,2:50 PM

## 2013-08-25 NOTE — Progress Notes (Addendum)
[facility] WILL GIVE 10 A.M. DOSE OF AVAPRO 75MG  P.O. WHEN ARRIVES FROM PHARM. PATIENT HAS NO I.V. ACCESS INABLE TO GIVE APRESOLINE

## 2013-08-25 NOTE — Progress Notes (Signed)
PULMONARY / CRITICAL CARE MEDICINE Name: [name] [name] MRN: [id number] DOB: 1940/07/25    ADMISSION DATE:  08/20/2013 CONSULTATION DATE:  08/23/2013  REFERRING MD :  Lendell CapriceSullivan  PRIMARY SERVICE:  TRH  CHIEF COMPLAINT:  SOB  BRIEF PATIENT DESCRIPTION: 73 year old female with mild intermittent asthma (RB), allergic rhinitis, upper airway cough syndrome and GERD with vocal cord symptoms presented on 4/28 with increased productive cough and dyspnea.  SIGNIFICANT EVENTS / STUDIES:  Sinus CT 5/3 >> Acute bilateral maxillary sinusitis   LINES / TUBES:  CULTURES:  ANTIBIOTICS: Azithromycin 4/29 > 5/1 Flagyl 4/29 > 5/1    INTERVAL HISTORY/ SUBJ:  Better, still harsh cough/ dry   VITAL SIGNS: Temp:  [97.5 F (36.4 C)-98.4 F (36.9 C)] 98.2 F (36.8 C) (05/01 1330) Pulse Rate:  [69-87] 87 (05/01 1330) Resp:  [18] 18 (05/01 1330) BP: (119-162)/(52-69) 145/52 mmHg (05/01 1330) SpO2:  [95 %-98 %] 95 % (05/01 1330) FIO2  2lpm NP  PHYSICAL EXAMINATION: General:  Elderly wf  in no acute distress Neuro:  Awake, oriented, no focal def  HEENT:  [place], No JVD, + upper airway wheeze,  phonation hoarse Cardiovascular:  rrr Lungs:  Min Exp wheeze, w/o accessory muscle use  Abdomen:  Sof, + bowel sounds  Musculoskeletal:  Intact  Skin:  Intact   LABS: CBC  Recent Labs Lab 08/20/13 2008  WBC 9.7  HGB 14.1  HCT 41.8  PLT 338   Coag's  Recent Labs Lab 08/21/13 0936 08/22/13 0505 08/23/13 0420  INR 2.94* 2.66* 2.61*   BMET  Recent Labs Lab 08/20/13 2008  NA 136*  K 4.0  CL 96  CO2 26  BUN 9  CREATININE 0.55  GLUCOSE 133*   Electrolytes  Recent Labs Lab 08/20/13 2008  CALCIUM 9.1   Sepsis Markers No results found for this basename: LATICACIDVEN, PROCALCITON, O2SATVEN,  in the last 168 hours ABG No results found for this basename: PHART, PCO2ART, PO2ART,  in the last 168 hours Liver Enzymes  Recent Labs Lab 08/20/13 2008  AST 19  ALT 16  ALKPHOS  77  BILITOT 0.3  ALBUMIN 3.7   Cardiac Enzymes  Recent Labs Lab 08/20/13 2009  PROBNP 172.8*   Glucose  Recent Labs Lab 08/22/13 0558 08/22/13 1126 08/22/13 1611 08/22/13 2040 08/23/13 0619 08/23/13 1114  GLUCAP 118* 136* 154* 149* 177* 140*   IMAGING:  No results found.  ASSESSMENT / PLAN:  Mild intermittent asthma, with exacerbation Allergic rhinitis  Acute bronchitis  Acute sinusitis - see sinus CT 5/3  GERD LPR (laryngopharyngeal reflux ) Upper airway cough syndrome   Supplemental oxygen for SpO2 > 92  Continue  nebulized Brovana / Budesonide  Albuterol PRN  Prednisone taper  Claritin / Singulair    Protonix to 80 bid    nasal hygiene regimen: nasal saline / Afrin / Flonase  Flutter valve             Suppress excess cough with tramadol prn             Don't see any options for rx of sinusitis > need ent/ID input ???     Sandrea HughsMichael Croix Presley, MD Pulmonary and Critical Care Medicine [facility] Cell [phone] After 5:30 PM or weekends, call [phone]

## 2013-08-25 NOTE — Progress Notes (Addendum)
[facility] PROGRESS NOTE  [name] [name] ZOX:[id number] DOB: 01-Nov-1940 DOA: 08/20/2013 PCP: Sanda Lingerhomas Jones, MD  Assessment/Plan:  Principal Problem:   Status asthmaticus/acute bronchitis, acute sinusitis, gastroesophageal reflux disease with laryngopharyngeal reflux and upper airway cough syndrome: Patient reports still feeling fatigued but slightly better than on admission. Physical examination is much improved. No wheezing today and has ambulated to the nursing station and back. Appreciate pulmonary's assistance. CT sinuses has been ordered this morning but no note yet from pulmonary medicine. Antibiotics have been stopped. Hopefully home soon Multiple drug allergies   HYPERTENSION   Type II or unspecified type diabetes mellitus: Controlled   Rash on feet: Not typical of drug rash. Monitor.   Acute respiratory failure: Currently without distress off oxygen. 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 Overall better. Still with cough.  Objective: Filed Vitals:   08/25/13 0905  BP: 163/65  Pulse: 71  Temp:   Resp: 20    Intake/Output Summary (Last 24 hours) at 08/25/13 1019 Last data filed at 08/25/13 0720  Gross per 24 hour  Intake   1080 ml  Output      0 ml  Net   1080 ml   Filed Weights   08/21/13 0138  Weight: 91.581 kg (201 lb 14.4 oz)    Exam:   General:  Off oxygen. Talkative. Ambulating in her room.  Cardiovascular: RRR without MGR  Respiratory: Diminished throughout without wheezes rhonchi or rales. Breathing nonlabored.  Abdomen: S, NT, ND  Ext: minimal pedal edema.  Erythematous papules right foot only  Basic Metabolic Panel:  Recent Labs Lab 08/20/13 2008 08/25/13 0450  NA 136* 138  K 4.0 4.1  CL 96 99  CO2 26 30  GLUCOSE 133* 107*  BUN 9 19  CREATININE 0.55 0.68  CALCIUM 9.1 8.7   Liver Function  Tests:  Recent Labs Lab 08/20/13 2008  AST 19  ALT 16  ALKPHOS 77  BILITOT 0.3  PROT 7.7  ALBUMIN 3.7   No results found for this basename: LIPASE, AMYLASE,  in the last 168 hours No results found for this basename: AMMONIA,  in the last 168 hours CBC:  Recent Labs Lab 08/20/13 2008 08/25/13 0450  WBC 9.7 10.4  NEUTROABS 7.0  --   HGB 14.1 12.7  HCT 41.8 38.3  MCV 91.5 91.2  PLT 338 337   Cardiac Enzymes: No results found for this basename: CKTOTAL, CKMB, CKMBINDEX, TROPONINI,  in the last 168 hours BNP (last 3 results)  Recent Labs  08/20/13 2009 08/23/13 1525  PROBNP 172.8* 122.3   CBG:  Recent Labs Lab 08/24/13 0600 08/24/13 1113 08/24/13 1617 08/24/13 2141 08/25/13 0618  GLUCAP 143* 111* 154* 206* 104*    No results found for this or any previous visit (from the past 240 hour(s)).   Studies: Dg Chest Port 1 View  08/23/2013   CLINICAL DATA:  Infiltrates, shortness of breath for several days, history of hypertension, nonsmoker  EXAM: PORTABLE CHEST - 1 VIEW  COMPARISON:  DG CHEST 2 VIEW dated 08/20/2013; DG CHEST 2 VIEW dated 06/17/2011; DG CHEST 2 VIEW dated 11/24/2007  FINDINGS: Examination is degraded due to patient body habitus and portable technique.  Grossly unchanged cardiac silhouette and mediastinal contours with slight differences attributable to decreased lung volumes and AP projection. Slight worsening of the bibasilar heterogeneous opacities, left greater than right, likely atelectasis. No discrete  focal airspace opacities. No pleural effusion or pneumothorax. No evidence of edema. No acute osseus abnormalities.  IMPRESSION: Minimal bibasilar atelectasis without acute cardiopulmonary disease on this hypoventilated AP portable examination. Further evaluation with a PA and lateral chest radiograph may be obtained as clinically indicated.   Electronically Signed   By: Simonne ComeJohn  Watts M.D.   On: 08/23/2013 16:51    Scheduled Meds: . arformoterol  15 mcg  Nebulization BID  . budesonide (PULMICORT) nebulizer solution  0.5 mg Nebulization BID  . dextromethorphan-guaiFENesin  2 tablet Oral BID  . fluticasone  2 spray Each Nare BID  . irbesartan  75 mg Oral Daily  . levothyroxine  25 mcg Oral QAC breakfast  . loratadine  10 mg Oral Daily  . metFORMIN  500 mg Oral Q breakfast  . montelukast  10 mg Oral QHS  . oxymetazoline  2 spray Each Nare BID  . pantoprazole  80 mg Oral BID  . predniSONE  60 mg Oral BID WC  . rosuvastatin  5 mg Oral q1800  . sodium chloride  2 spray Each Nare QID  . sodium chloride  3 mL Intravenous Q12H  . warfarin  1 each Does not apply Once  . Warfarin - Pharmacist Dosing Inpatient   Does not apply q1800   Continuous Infusions:   Time spent: 25 minutes  Christiane Haorinna L Dastan Krider, MD  [facility] Pager [phone]. If 7PM-7AM, please contact night-coverage at [email or web address], password [email or web address] 08/25/2013, 10:19 AM  LOS: 5 days

## 2013-08-26 DIAGNOSIS — J019 Acute sinusitis, unspecified: Secondary | ICD-10-CM | POA: Diagnosis not present

## 2013-08-26 DIAGNOSIS — J45902 Unspecified asthma with status asthmaticus: Secondary | ICD-10-CM | POA: Diagnosis not present

## 2013-08-26 DIAGNOSIS — J9600 Acute respiratory failure, unspecified whether with hypoxia or hypercapnia: Secondary | ICD-10-CM | POA: Diagnosis not present

## 2013-08-26 DIAGNOSIS — J45909 Unspecified asthma, uncomplicated: Secondary | ICD-10-CM | POA: Diagnosis not present

## 2013-08-26 LAB — PROTIME-INR
INR: 2.05 — ABNORMAL HIGH (ref 0.00–1.49)
Prothrombin Time: 22.5 seconds — ABNORMAL HIGH (ref 11.6–15.2)

## 2013-08-26 LAB — GLUCOSE, CAPILLARY
GLUCOSE-CAPILLARY: 129 mg/dL — AB (ref 70–99)
GLUCOSE-CAPILLARY: 168 mg/dL — AB (ref 70–99)

## 2013-08-26 MED ORDER — SALINE SPRAY 0.65 % NA SOLN: 2.0000 | Freq: Four times a day (QID) | NASAL | Status: DC

## 2013-08-26 MED ORDER — DM-GUAIFENESIN ER 30-600 MG PO TB12: 2.0000 | ORAL_TABLET | Freq: Two times a day (BID) | ORAL | Status: DC

## 2013-08-26 MED ORDER — WARFARIN SODIUM 5 MG PO TABS
5.0000 mg | ORAL_TABLET | Freq: Once | ORAL | Status: DC
  Filled 2013-08-26: qty 1

## 2013-08-26 MED ORDER — ARFORMOTEROL TARTRATE 15 MCG/2ML IN NEBU: 15.0000 ug | INHALATION_SOLUTION | Freq: Two times a day (BID) | RESPIRATORY_TRACT | Status: DC

## 2013-08-26 MED ORDER — OXYMETAZOLINE HCL 0.05 % NA SOLN: 2.0000 | Freq: Two times a day (BID) | NASAL | Status: DC

## 2013-08-26 MED ORDER — FLUTICASONE PROPIONATE 50 MCG/ACT NA SUSP: 2.0000 | Freq: Two times a day (BID) | NASAL | Status: DC

## 2013-08-26 MED ORDER — TRAMADOL HCL 50 MG PO TABS: 50.0000 mg | ORAL_TABLET | ORAL | Status: DC | PRN

## 2013-08-26 MED ORDER — BUDESONIDE 0.5 MG/2ML IN SUSP: 0.5000 mg | Freq: Two times a day (BID) | RESPIRATORY_TRACT | Status: DC

## 2013-08-26 MED ORDER — PREDNISONE 20 MG PO TABS: ORAL_TABLET | ORAL | Status: DC

## 2013-08-26 NOTE — Progress Notes (Signed)
Discharge education and medication reviewed with pt, pt stated understanding and that she had no questions, prescriptions given to pt, pt dressed and awaiting ride Archie Balboalivia G Stein, RN

## 2013-08-26 NOTE — Progress Notes (Signed)
ANTICOAGULATION CONSULT NOTE - Follow Up Consult  Pharmacy Consult for Coumadin Indication: atrial fibrillation  Allergies  Allergen Reactions  . Penicillins Hives  . Tetracycline Swelling    Swollen tongue  . Cardizem [Diltiazem Hcl]     edema  . Acetaminophen   . Cefaclor   . Cephalexin   . Clindamycin   . Ibuprofen   . Nsaids     Respiratory-Asthma  . Pravastatin Sodium   . Simvastatin   . Sulfonamide Derivatives   . Avelox [Moxifloxacin Hcl In Nacl] Rash  . Levaquin [Levofloxacin In D5w] Rash    Patient Measurements: Height: 5\' 6"  (167.6 cm) Weight: 201 lb 14.4 oz (91.581 kg) IBW/kg (Calculated) : 59.3   Vital Signs: Temp: 97.5 F (36.4 C) (05/04 0617) Temp src: Oral (05/04 0617) BP: 179/97 mmHg (05/04 0617) Pulse Rate: 63 (05/04 0617)  Labs:  Recent Labs  08/24/13 0418 08/25/13 0450 08/26/13 0531  HGB  --  12.7  --   HCT  --  38.3  --   PLT  --  337  --   LABPROT 29.6* 30.9* 22.5*  INR 2.94* 3.11* 2.05*  CREATININE  --  0.68  --     Estimated Creatinine Clearance: 70.3 ml/min (by C-G formula based on Cr of 0.68).  Assessment: 72 YOF continued on coumadin for afib. INR down to 2.05 (therapeutic). Probably trending down now s/p 3 days of azithromycin and flagyl 4/29-5/1 which elevated INR. No bleeding noted. She has been requiring lower than home dose since this admission.  PTA dose: 7.5mg  daily except for Wednesday takes 5mg  per coumadin clinic note on 4/24  Goal of Therapy:  INR 2-3 Monitor platelets by anticoagulation protocol: Yes   Plan:  - Coumadin 5 mg po x 1 today - Monitor daily INR - If pt d/c today, consider 5mg  daily with f/u INR in 2-3 days.  [name] [name], PharmD, BCPS Clinical pharmacist, pager [phone]  08/26/2013,9:33 AM

## 2013-08-26 NOTE — Progress Notes (Signed)
PULMONARY / CRITICAL CARE MEDICINE Name: [name] [name] MRN: [id number] DOB: Mar 02, 1941    ADMISSION DATE:  08/20/2013 CONSULTATION DATE:  08/23/2013  REFERRING MD :  Lendell CapriceSullivan  PRIMARY SERVICE:  TRH  CHIEF COMPLAINT:  SOB  BRIEF PATIENT DESCRIPTION: 73 year old female with mild intermittent asthma (RB), allergic rhinitis, upper airway cough syndrome and GERD with vocal cord symptoms presented on 4/28 with increased productive cough and dyspnea.  SIGNIFICANT EVENTS / STUDIES:  Sinus CT 5/3 >> Acute bilateral maxillary sinusitis   LINES / TUBES:  CULTURES:  ANTIBIOTICS: Azithromycin 4/29 > 5/1 Flagyl 4/29 > 5/1    INTERVAL HISTORY/ SUBJ:  Better, still harsh cough/ dry   VITAL SIGNS: Temp:  [97.5 F (36.4 C)-98.4 F (36.9 C)] 98.2 F (36.8 C) (05/01 1330) Pulse Rate:  [69-87] 87 (05/01 1330) Resp:  [18] 18 (05/01 1330) BP: (119-162)/(52-69) 145/52 mmHg (05/01 1330) SpO2:  [95 %-98 %] 95 % (05/01 1330) FIO2  2lpm NP  PHYSICAL EXAMINATION: General:  Elderly wf  in no acute distress. Neuro:  Awake, oriented, no focal def  HEENT:  , No JVD, no upper airway wheeze,  phonation hoarse Cardiovascular:  rrr Lungs:  No Exp wheeze, w/o accessory muscle use  Abdomen:  Sof, + bowel sounds  Musculoskeletal:  Intact  Skin:  Intact   LABS: CBC  Recent Labs Lab 08/20/13 2008  WBC 9.7  HGB 14.1  HCT 41.8  PLT 338   Coag's  Recent Labs Lab 08/21/13 0936 08/22/13 0505 08/23/13 0420  INR 2.94* 2.66* 2.61*   BMET  Recent Labs Lab 08/20/13 2008  NA 136*  K 4.0  CL 96  CO2 26  BUN 9  CREATININE 0.55  GLUCOSE 133*   Electrolytes  Recent Labs Lab 08/20/13 2008  CALCIUM 9.1   Sepsis Markers No results found for this basename: LATICACIDVEN, PROCALCITON, O2SATVEN,  in the last 168 hours ABG No results found for this basename: PHART, PCO2ART, PO2ART,  in the last 168 hours Liver Enzymes  Recent Labs Lab 08/20/13 2008  AST 19  ALT 16  ALKPHOS  77  BILITOT 0.3  ALBUMIN 3.7   Cardiac Enzymes  Recent Labs Lab 08/20/13 2009  PROBNP 172.8*   Glucose  Recent Labs Lab 08/22/13 0558 08/22/13 1126 08/22/13 1611 08/22/13 2040 08/23/13 0619 08/23/13 1114  GLUCAP 118* 136* 154* 149* 177* 140*   IMAGING:  No results found.  ASSESSMENT / PLAN:  Mild intermittent asthma, with exacerbation Allergic rhinitis  Acute bronchitis  Acute sinusitis - see sinus CT 5/3  GERD LPR (laryngopharyngeal reflux ) Upper airway cough syndrome   Supplemental oxygen for SpO2 > 92(ambulated on RA sats 91%)  Continue  nebulized Brovana / Budesonide  Albuterol PRN  Prednisone taper  Claritin / Singulair    Protonix to 80 bid    nasal hygiene regimen: nasal saline / Afrin / Flonase  Flutter valve             Suppress excess cough with tramadol prn  Please schedule follow up with ID for management of sinusitis due to multiple ABX allergies.             Clear for DC from pulmonary standpoint  Brett CanalesSteve Minor ACNP [facility] Pager [phone] till 3 pm If no answer page [phone] 08/26/2013, 8:43 AM  Joneen RoachPaul Hoffman, ACNP [facility] Pager [phone] or [phone]     STAFF NOTE  She has sinusitis related upper airway and asthma exacerbation. She  has lot of allergies to to lot of antibitotics. sHe has gotten better with prednisone. She is deferring antibiotics to me. I d/w DR Drue SecondSnider of ID who will see her has opd consultation. [facility] and fu with Dr Delton CoombesByrum  Dr. Kalman ShanMurali Dell Briner, M.D., [facility] LL[facility].C.C.P Pulmonary and Critical Care Medicine [facility] Pager: [phone], If no answer or between  15:00h - 7:00h: call [phone]  08/26/2013 2:04 PM  le. Follow-up Information   Follow up with Sanda Lingerhomas Jones, MD In 1 week.   Specialty:  Internal Medicine   Contact information:   [address] [place] [place]  [id number] [phone]       Follow up with Leslye PeerBYRUM,ROBERT S., MD On 09/11/2013. (4:00 PM - [facility])    Specialty:  Pulmonary Disease   Contact information:   [address] [place] [id number] [phone]

## 2013-08-26 NOTE — Discharge Summary (Signed)
Physician Discharge Summary  [name] [name] ZOX:[id number] DOB: Jan 27, 1941 DOA: 08/20/2013  PCP: Sanda Lingerhomas Jones, MD  Admit date: 08/20/2013 Discharge date: 08/26/2013  Time spent: 35 minutes  Recommendations for Outpatient Follow-up:  1. pulm follow up 2. ent follow up for sinusitis with multiple allergies  Discharge Diagnoses:  Principal Problem:   Status asthmaticus Active Problems:   HYPERTENSION   Type II or unspecified type diabetes mellitus without mention of complication, uncontrolled   Rash   Acute bronchitis   Acute sinusitis   Acute respiratory failure   Upper airway cough syndrome   LPRD (laryngopharyngeal reflux disease)   Discharge Condition: improved  Diet recommendation: cardiac/diabetic  Filed Weights   08/21/13 0138  Weight: 91.581 kg (201 lb 14.4 oz)    History of present illness:  [name] [name] is a 73 y.o. female who presents to the ED with 3 day history of worsening SOB and wheezing. She states this is a typical asthma exacerbation for her, and typically has troubles with asthma around this time of year (maple pollen is the trigger she states to me). Increased cough and wheezing for past 3 days, she presents to the ED.  She does have a macular rash on her feet which she feels is unrelated to her SOB, and states that this is most likely due to a recent course of Avelox (apparently this sort of rash has happened in the past with Avelox).   Hospital Course:  Status asthmaticus/acute bronchitis, acute sinusitis: Continue nebulized Brovana / Budesonide  Albuterol PRN  Prednisone taper  Claritin / Singulair  PPI nasal hygiene regimen: nasal saline / Afrin / Flonase  Flutter valve  Suppress excess cough with tramadol prn  Spoke with ID- recs ENT follow up for possible abx of sinusitis- no fever, no WBC count  HYPERTENSION  Type II or unspecified type diabetes mellitus: Controlled  Rash on feet: Not typical of drug rash. Monitor.  Acute  respiratory failure: Currently without distress off oxygen.  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 If you have any questions about your discharge medications or the care you received while you were in the hospital after you are discharged, you can call the unit and asked to speak with the hospitalist on call if the hospitalist that took care of you is not available. Once you are discharged, your primary care physician will handle any further medical issues. Please note that NO REFILLS for any discharge medications will be authorized once you are discharged, as it is imperative that you return to your primary care physician (or establish a relationship with a primary care physician if you do not have one) for your aftercare needs so that they can reassess your need for medications and monitor your lab values.      Discharge Orders   Future Appointments Provider Department Dept Phone   08/29/2013 1:00 PM Etta Grandchildhomas L Jones, MD [facility] [place] [phone]   09/11/2013 4:00 PM Leslye Peerobert S Byrum, MD [facility] [phone]   09/13/2013 11:00 AM [facility] [phone]   Future Orders Complete By Expires   Diet - low sodium heart healthy  As directed    Discharge instructions  As directed    Increase activity slowly  As directed        Medication List    STOP taking these medications       budesonide-formoterol 160-4.5 MCG/ACT inhaler  Commonly known as:  SYMBICORT     guaifenesin 100 MG/5ML syrup  Commonly known as:  ROBITUSSIN      TAKE these medications       albuterol 108 (90 BASE) MCG/ACT inhaler   Commonly known as:  PROAIR HFA  Inhale 2 puffs into the lungs every 6 (six) hours as needed for wheezing or shortness of breath.     arformoterol 15 MCG/2ML Nebu  Commonly known as:  BROVANA  Take 2 mLs (15 mcg total) by nebulization 2 (two) times daily.     budesonide 0.5 MG/2ML nebulizer solution  Commonly known as:  PULMICORT  Take 2 mLs (0.5 mg total) by nebulization 2 (two) times daily.     calcium-vitamin D 500-200 MG-UNIT per tablet  Commonly known as:  OSCAL WITH D  Take 2 tablets by mouth daily.     dextromethorphan-guaiFENesin 30-600 MG per 12 hr tablet  Commonly known as:  MUCINEX DM  Take 2 tablets by mouth 2 (two) times daily.     esomeprazole 40 MG capsule  Commonly known as:  NEXIUM  Take 40 mg by mouth 2 (two) times daily before a meal.     fluticasone 50 MCG/ACT nasal spray  Commonly known as:  FLONASE  Place 2 sprays into both nostrils 2 (two) times daily.     GLUCOSAMINE 1500 COMPLEX PO  Take 1 tablet by mouth daily.     levothyroxine 25 MCG tablet  Commonly known as:  SYNTHROID, LEVOTHROID  Take 25 mcg by mouth daily before breakfast.     loratadine 10 MG tablet  Commonly known as:  CLARITIN  Take 10 mg by mouth daily.     metFORMIN 500 MG 24 hr tablet  Commonly known as:  GLUCOPHAGE-XR  Take 500 mg by mouth daily with breakfast.     montelukast 10 MG tablet  Commonly known as:  SINGULAIR  Take 1 tablet (10 mg total) by mouth at bedtime.     olmesartan 40 MG tablet  Commonly known as:  BENICAR  Take 40 mg by mouth daily.     oxymetazoline 0.05 % nasal spray  Commonly known as:  AFRIN  Place 2 sprays into both nostrils 2 (two) times daily.     predniSONE 20 MG tablet  Commonly known as:  DELTASONE  40 mg x 3 days, 30 mg x 3 days, 20 mg x 3 days, 10 mg x 3 days, 5 mg x 3 days and then d/c     rosuvastatin 10 MG tablet  Commonly known as:  CRESTOR  Take 5 mg by mouth daily.     sodium chloride 0.65 % Soln nasal spray  Commonly known  as:  OCEAN  Place 2 sprays into both nostrils 4 (four) times daily.     traMADol 50 MG tablet  Commonly known as:  ULTRAM  Take 1 tablet (50 mg total) by mouth every 4 (four) hours as needed (cough).     warfarin 5 MG tablet  Commonly known as:  COUMADIN  Take 5-7.5 mg by mouth daily. Take 1 tablet on Wednesday then take 1 and 1/2 tablets every other day       Allergies  Allergen Reactions  .  Penicillins Hives  . Tetracycline Swelling    Swollen tongue  . Cardizem [Diltiazem Hcl]     edema  . Acetaminophen   . Cefaclor   . Cephalexin   . Clindamycin   . Ibuprofen   . Nsaids     Respiratory-Asthma  . Pravastatin Sodium   . Simvastatin   . Sulfonamide Derivatives   . Avelox [Moxifloxacin Hcl In Nacl] Rash  . Levaquin [Levofloxacin In D5w] Rash   Follow-up Information   Follow up with Sanda Linger, MD In 1 week.   Specialty:  Internal Medicine   Contact information:   [address] [place] [address] [phone]       Follow up with Leslye Peer., MD On 09/11/2013. (4:00 PM - [facility])    Specialty:  Pulmonary Disease   Contact information:   [address] [place] [address] [phone]        The results of significant diagnostics from this hospitalization (including imaging, microbiology, ancillary and laboratory) are listed below for reference.    Significant Diagnostic Studies: Dg Chest 2 View  08/25/2013   CLINICAL DATA:  Pneumonia, hypertension, history of asthma  EXAM: CHEST  2 VIEW  COMPARISON:  DG CHEST 1V PORT dated 08/23/2013; DG CHEST 2 VIEW dated 08/20/2013; DG CHEST 2 VIEW dated 06/17/2011  FINDINGS: Grossly unchanged cardiac silhouette and mediastinal contours. The lungs appear mildly hyperexpanded with mild diffuse slightly nodular thickening of the pulmonary interstitium. Grossly unchanged bibasilar heterogeneous opacities, right greater than left, likely atelectasis or scar. No new focal airspace opacities. No  pleural effusion or pneumothorax. No evidence of edema. No acute osseus abnormalities.  IMPRESSION: Mild lung hyperexpansion and bronchitic change without acute cardiopulmonary disease. Specifically, no evidence of pneumonia.   Electronically Signed   By: Simonne Come M.D.   On: 08/25/2013 14:16   Dg Chest 2 View  08/20/2013   CLINICAL DATA:  Asthma  EXAM: CHEST  2 VIEW  COMPARISON:  Prior chest x-ray 06/17/2011  FINDINGS: Stable cardiac and mediastinal contours. Trace atherosclerotic calcification in the transverse aorta. Stable mild pulmonary hyperinflation, central bronchitic changes and interstitial prominence consistent with a chronic obstructive process. No focal airspace consolidation, edema, pleural effusion or pneumothorax. No acute osseous abnormality.  IMPRESSION: Stable chest x-ray without evidence of acute cardiopulmonary process.   Electronically Signed   By: Malachy Moan M.D.   On: 08/20/2013 21:17   Dg Chest Port 1 View  08/23/2013   CLINICAL DATA:  Infiltrates, shortness of breath for several days, history of hypertension, nonsmoker  EXAM: PORTABLE CHEST - 1 VIEW  COMPARISON:  DG CHEST 2 VIEW dated 08/20/2013; DG CHEST 2 VIEW dated 06/17/2011; DG CHEST 2 VIEW dated 11/24/2007  FINDINGS: Examination is degraded due to patient body habitus and portable technique.  Grossly unchanged cardiac silhouette and mediastinal contours with slight differences attributable to decreased lung volumes and AP projection. Slight worsening of the bibasilar heterogeneous opacities, left greater than right, likely atelectasis. No discrete focal airspace opacities. No pleural effusion or pneumothorax. No evidence of edema. No acute osseus abnormalities.  IMPRESSION: Minimal bibasilar atelectasis without acute cardiopulmonary disease on this hypoventilated AP portable examination. Further evaluation with a PA and lateral chest radiograph may be obtained as clinically indicated.   Electronically Signed   By: Simonne Come M.D.   On: 08/23/2013 16:51   Ct Maxillofacial  Ltd Wo Cm  08/25/2013   CLINICAL DATA:  Asthma with rhinitis.  EXAM: CT  PARANASAL SINUS LIMITED WITHOUT CONTRAST  TECHNIQUE: Non-contiguous multidetector CT images of the paranasal sinuses were obtained in a single plane without contrast.  COMPARISON:  01/19/2007.  FINDINGS: Postsurgical changes in the bilateral maxillary antra, which display wide openings. Air-fluid levels with foamy secretions in the maxillary sinuses are consistent with bilateral acute sinusitis.  Extensive bilateral anterior ethmoidectomies. Some persistent fluid in a posterior ethmoid air cell on the right.  Frontal sinuses are hypoplastic but clear.  Retention cyst noted in the larger left division of the sphenoid sinus.  Sigmoid shaped nasal septum. Diminutive appearing inferior turbinates. No evidence for maxillary osteitis. Negative intracranial compartment. Negative orbits.  Compared with the prior study, significant maxillary and ethmoid sinusitis was present. Patient had not yet had surgery in 2008.  IMPRESSION: Acute bilateral maxillary sinusitis. Postsurgical changes as described.   Electronically Signed   By: Davonna BellingJohn  Curnes M.D.   On: 08/25/2013 11:50    Microbiology: No results found for this or any previous visit (from the past 240 hour(s)).   Labs: Basic Metabolic Panel:  Recent Labs Lab 08/20/13 2008 08/25/13 0450  NA 136* 138  K 4.0 4.1  CL 96 99  CO2 26 30  GLUCOSE 133* 107*  BUN 9 19  CREATININE 0.55 0.68  CALCIUM 9.1 8.7   Liver Function Tests:  Recent Labs Lab 08/20/13 2008  AST 19  ALT 16  ALKPHOS 77  BILITOT 0.3  PROT 7.7  ALBUMIN 3.7   No results found for this basename: LIPASE, AMYLASE,  in the last 168 hours No results found for this basename: AMMONIA,  in the last 168 hours CBC:  Recent Labs Lab 08/20/13 2008 08/25/13 0450  WBC 9.7 10.4  NEUTROABS 7.0  --   HGB 14.1 12.7  HCT 41.8 38.3  MCV 91.5 91.2  PLT 338 337    Cardiac Enzymes: No results found for this basename: CKTOTAL, CKMB, CKMBINDEX, TROPONINI,  in the last 168 hours BNP: BNP (last 3 results)  Recent Labs  08/20/13 2009 08/23/13 1525  PROBNP 172.8* 122.3   CBG:  Recent Labs Lab 08/25/13 1124 08/25/13 1636 08/25/13 2148 08/26/13 0610 08/26/13 1111  GLUCAP 115* 163* 166* 129* 168*       Signed:  Joseph ArtJessica U Camiya Vinal  [facility] 08/26/2013, 1:46 PM

## 2013-08-29 ENCOUNTER — Encounter: Payer: Self-pay | Admitting: Internal Medicine

## 2013-08-29 ENCOUNTER — Other Ambulatory Visit (INDEPENDENT_AMBULATORY_CARE_PROVIDER_SITE_OTHER): Payer: Medicare Other

## 2013-08-29 ENCOUNTER — Ambulatory Visit (INDEPENDENT_AMBULATORY_CARE_PROVIDER_SITE_OTHER): Payer: Medicare Other | Admitting: Internal Medicine

## 2013-08-29 VITALS — BP 122/58 | HR 73 | Temp 99.2°F | Resp 16 | Wt 201.0 lb

## 2013-08-29 DIAGNOSIS — E1165 Type 2 diabetes mellitus with hyperglycemia: Principal | ICD-10-CM

## 2013-08-29 DIAGNOSIS — I10 Essential (primary) hypertension: Secondary | ICD-10-CM | POA: Diagnosis not present

## 2013-08-29 DIAGNOSIS — IMO0001 Reserved for inherently not codable concepts without codable children: Secondary | ICD-10-CM

## 2013-08-29 DIAGNOSIS — E039 Hypothyroidism, unspecified: Secondary | ICD-10-CM

## 2013-08-29 DIAGNOSIS — J45909 Unspecified asthma, uncomplicated: Secondary | ICD-10-CM | POA: Diagnosis not present

## 2013-08-29 DIAGNOSIS — J4520 Mild intermittent asthma, uncomplicated: Secondary | ICD-10-CM

## 2013-08-29 LAB — HEMOGLOBIN A1C: Hgb A1c MFr Bld: 6.3 % (ref 4.6–6.5)

## 2013-08-29 LAB — TSH: TSH: 4.05 u[IU]/mL (ref 0.35–4.50)

## 2013-08-29 NOTE — Patient Instructions (Signed)

## 2013-08-29 NOTE — Progress Notes (Signed)
Subjective:    Patient ID: [name] [name], female    DOB: 12-May-1940, 73 y.o.   MRN: [id number]  Thyroid Problem Presents for follow-up visit. Symptoms include fatigue. Patient reports no anxiety, cold intolerance, constipation, depressed mood, diaphoresis, diarrhea, dry skin, hair loss, heat intolerance, hoarse voice, leg swelling, nail problem, palpitations, tremors, visual change, weight gain or weight loss. The symptoms have been stable. Past treatments include levothyroxine. The treatment provided moderate relief.      Review of Systems  Constitutional: Positive for fatigue. Negative for fever, chills, weight loss, weight gain, diaphoresis, activity change, appetite change and unexpected weight change.  HENT: Negative.  Negative for hoarse voice.   Eyes: Negative.   Respiratory: Negative.  Negative for cough, choking, chest tightness, shortness of breath, wheezing and stridor.   Cardiovascular: Negative.  Negative for chest pain, palpitations and leg swelling.  Gastrointestinal: Negative.  Negative for nausea, vomiting, abdominal pain, diarrhea, constipation and blood in stool.  Endocrine: Negative.  Negative for cold intolerance and heat intolerance.  Genitourinary: Negative.   Musculoskeletal: Negative.   Skin: Negative.   Allergic/Immunologic: Negative.   Neurological: Negative.  Negative for dizziness and tremors.  Hematological: Negative.  Negative for adenopathy. Does not bruise/bleed easily.  Psychiatric/Behavioral: Negative.        Objective:   Physical Exam  Vitals reviewed. Constitutional: She is oriented to person, place, and time. She appears well-developed and well-nourished. No distress.  HENT:  Head: Normocephalic and atraumatic.  Mouth/Throat: Oropharynx is clear and moist. No oropharyngeal exudate.  Eyes: Conjunctivae are normal. Right eye exhibits no discharge. Left eye exhibits no discharge. No scleral icterus.  Neck: Normal range of motion. Neck  supple. No JVD present. No tracheal deviation present. No thyromegaly present.  Cardiovascular: Normal rate, regular rhythm, S1 normal, S2 normal and intact distal pulses.  Exam reveals no gallop, no S3, no S4 and no friction rub.   Murmur heard.  Decrescendo systolic murmur is present with a grade of 1/6   No diastolic murmur is present  Pulses:      Carotid pulses are 1+ on the right side, and 1+ on the left side.      Radial pulses are 1+ on the right side, and 1+ on the left side.       Femoral pulses are 1+ on the right side, and 1+ on the left side.      Popliteal pulses are 1+ on the right side, and 1+ on the left side.       Dorsalis pedis pulses are 1+ on the right side, and 1+ on the left side.       Posterior tibial pulses are 1+ on the right side, and 1+ on the left side.  Pulmonary/Chest: Effort normal and breath sounds normal. No stridor. No respiratory distress. She has no wheezes. She has no rales. She exhibits no tenderness.  Abdominal: Soft. Bowel sounds are normal. She exhibits no distension and no mass. There is no tenderness. There is no rebound and no guarding.  Musculoskeletal: Normal range of motion. She exhibits no edema and no tenderness.  Lymphadenopathy:    She has no cervical adenopathy.  Neurological: She is oriented to person, place, and time.  Skin: Skin is warm and dry. No rash noted. She is not diaphoretic. No erythema. No pallor.  Psychiatric: She has a normal mood and affect. Her behavior is normal. Judgment and thought content normal.     Lab Results  Component Value Date  WBC 10.4 08/25/2013   HGB 12.7 08/25/2013   HCT 38.3 08/25/2013   PLT 337 08/25/2013   GLUCOSE 107* 08/25/2013   CHOL 144 01/29/2013   TRIG 61.0 01/29/2013   HDL 63.50 01/29/2013   LDLCALC 68 01/29/2013   ALT 16 08/20/2013   AST 19 08/20/2013   NA 138 08/25/2013   K 4.1 08/25/2013   CL 99 08/25/2013   CREATININE 0.68 08/25/2013   BUN 19 08/25/2013   CO2 30 08/25/2013   TSH 4.21 05/01/2013   INR  2.05* 08/26/2013   HGBA1C 6.1 05/01/2013   MICROALBUR 1.2 09/28/2012       Assessment & Plan:

## 2013-08-29 NOTE — Progress Notes (Signed)
Pre visit review using our clinic review tool, if applicable. No additional management support is needed unless otherwise documented below in the visit note. 

## 2013-08-30 ENCOUNTER — Encounter: Payer: Self-pay | Admitting: Internal Medicine

## 2013-08-30 NOTE — Assessment & Plan Note (Signed)
Improvement noted s/p recent admission

## 2013-08-30 NOTE — Assessment & Plan Note (Signed)
Her BP is well controlled 

## 2013-08-30 NOTE — Assessment & Plan Note (Signed)
Her TSH is on the normal range She will stay on the current dose 

## 2013-08-30 NOTE — Assessment & Plan Note (Signed)
Her blood sugars are well controlled 

## 2013-08-31 ENCOUNTER — Other Ambulatory Visit: Payer: Self-pay | Admitting: Internal Medicine

## 2013-09-11 ENCOUNTER — Encounter: Payer: Self-pay | Admitting: Emergency Medicine

## 2013-09-11 ENCOUNTER — Ambulatory Visit (INDEPENDENT_AMBULATORY_CARE_PROVIDER_SITE_OTHER): Payer: Medicare Other | Admitting: Emergency Medicine

## 2013-09-11 VITALS — BP 124/60 | HR 75 | Ht 66.0 in | Wt 159.0 lb

## 2013-09-11 DIAGNOSIS — J45909 Unspecified asthma, uncomplicated: Secondary | ICD-10-CM | POA: Diagnosis not present

## 2013-09-11 DIAGNOSIS — J4520 Mild intermittent asthma, uncomplicated: Secondary | ICD-10-CM

## 2013-09-11 NOTE — Assessment & Plan Note (Signed)
Please continue your brovana + pulmicort daily until you run out, then stop Stop your prednisone now continue loratadine and Singulair Follow with Dr Delton CoombesByrum in 3 months or sooner if you have any problems.

## 2013-09-11 NOTE — Progress Notes (Signed)
   Subjective:    Patient ID: [name] [name], female    DOB: 1940-12-20, 73 y.o.   MRN: [id number]  HPI 73 yo woman, never smoker, hx of migraines, GERD, A Fib, allergic rhinitis. She has frequent sinusitis, bronchitis.  Carries the dx of asthma that was made 2-3 yrs ago. She has DOE, some wheeze, cough. She has a lot of drug allergies but has been able to tolerate avelox. She completed two courses avelox recently for productive cough with clear to green mucous. She was started on symbicort 1 year ago. She is on nexium bid. She is on loratadine and fluticasone nasal spray bid. She does nasal washes rarely. She believes the Symbicort is helpful.   ROV 07/22/13 -- follows up for allergies, cough/wheeze dx as asthma. Returns today after PFT > shows moderate AFL with a BD response consistent w dx asthma. She is on fluticasone qd, loratadine, singulair. She doesn't do NSW right now. She uses symbicort bid, rare albuterol use.   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  She was dx also with B maxillary sinusitis. She is still on prednisone, but she started taking just 20mg  a day for the last 5 days (she didn't taper).  She is taking brovana + pulmicort but only once a day.   Review of Systems  Constitutional: Negative for fever and unexpected weight change.  HENT: Positive for congestion and postnasal drip. Negative for dental problem, ear pain, nosebleeds, rhinorrhea, sinus pressure, sneezing, sore throat and trouble swallowing.   Eyes: Negative for redness and itching.  Respiratory: Positive for cough, chest tightness, shortness of breath and wheezing.   Cardiovascular: Negative for palpitations and leg swelling.  Gastrointestinal: Negative for nausea and vomiting.  Genitourinary: Negative for dysuria.  Musculoskeletal: Negative for joint swelling.  Skin: Negative for rash.   Neurological: Negative for headaches.  Hematological: Does not bruise/bleed easily.  Psychiatric/Behavioral: Negative for dysphoric mood. The patient is not nervous/anxious.        Objective:   Physical Exam Filed Vitals:   09/11/13 1559  BP: 124/60  Pulse: 75  Height: 5\' 6"  (1.676 m)  Weight: 159 lb (72.122 kg)  SpO2: 98%   Gen: Pleasant, well-nourished, in no distress,  normal affect  ENT: No lesions, narrow posterior pharynx with some erythema, oropharynx clear, no postnasal drip  Neck: No JVD, no TMG, no carotid bruits  Lungs: No use of accessory muscles, clear without rales or rhonchi  Cardiovascular: RRR, heart sounds normal, no murmur or gallops, no peripheral edema  Musculoskeletal: No deformities, no cyanosis or clubbing  Neuro: alert, non focal  Skin: Warm, no lesions or rashes     Assessment & Plan:  Asthma, mild intermittent Please continue your brovana + pulmicort daily until you run out, then stop Stop your prednisone now continue loratadine and Singulair Follow with Dr Delton CoombesByrum in 3 months or sooner if you have any problems.

## 2013-09-11 NOTE — Patient Instructions (Addendum)
Please continue your brovana + pulmicort daily until you run out, then stop Stop your prednisone now continue loratadine and Singulair Follow with Dr Jennaya Pogue in 3 months or sooner if you have any problems. 

## 2013-09-13 ENCOUNTER — Ambulatory Visit (INDEPENDENT_AMBULATORY_CARE_PROVIDER_SITE_OTHER): Payer: Medicare Other | Admitting: General Practice

## 2013-09-13 DIAGNOSIS — I4891 Unspecified atrial fibrillation: Secondary | ICD-10-CM

## 2013-09-13 DIAGNOSIS — Z5181 Encounter for therapeutic drug level monitoring: Secondary | ICD-10-CM | POA: Diagnosis not present

## 2013-09-13 LAB — POCT INR: INR: 4.5

## 2013-09-13 NOTE — Progress Notes (Signed)
Pre visit review using our clinic review tool, if applicable. No additional management support is needed unless otherwise documented below in the visit note. 

## 2013-10-04 ENCOUNTER — Ambulatory Visit (INDEPENDENT_AMBULATORY_CARE_PROVIDER_SITE_OTHER): Payer: Medicare Other | Admitting: General Practice

## 2013-10-04 DIAGNOSIS — I4891 Unspecified atrial fibrillation: Secondary | ICD-10-CM

## 2013-10-04 DIAGNOSIS — Z5181 Encounter for therapeutic drug level monitoring: Secondary | ICD-10-CM

## 2013-10-04 LAB — POCT INR: INR: 2.2

## 2013-10-04 NOTE — Progress Notes (Signed)
Pre visit review using our clinic review tool, if applicable. No additional management support is needed unless otherwise documented below in the visit note. 

## 2013-10-30 ENCOUNTER — Ambulatory Visit (INDEPENDENT_AMBULATORY_CARE_PROVIDER_SITE_OTHER): Payer: Medicare Other | Admitting: General Practice

## 2013-10-30 DIAGNOSIS — Z5181 Encounter for therapeutic drug level monitoring: Secondary | ICD-10-CM | POA: Diagnosis not present

## 2013-10-30 DIAGNOSIS — I4891 Unspecified atrial fibrillation: Secondary | ICD-10-CM

## 2013-10-30 LAB — POCT INR: INR: 1.9

## 2013-10-30 NOTE — Progress Notes (Signed)
Pre visit review using our clinic review tool, if applicable. No additional management support is needed unless otherwise documented below in the visit note. 

## 2013-10-31 ENCOUNTER — Encounter: Payer: Self-pay | Admitting: Emergency Medicine

## 2013-10-31 ENCOUNTER — Ambulatory Visit (INDEPENDENT_AMBULATORY_CARE_PROVIDER_SITE_OTHER): Payer: Medicare Other | Admitting: Emergency Medicine

## 2013-10-31 VITALS — BP 130/78 | HR 78 | Ht 66.0 in | Wt 209.0 lb

## 2013-10-31 DIAGNOSIS — R05 Cough: Secondary | ICD-10-CM | POA: Diagnosis not present

## 2013-10-31 DIAGNOSIS — J4520 Mild intermittent asthma, uncomplicated: Secondary | ICD-10-CM

## 2013-10-31 DIAGNOSIS — R058 Other specified cough: Secondary | ICD-10-CM

## 2013-10-31 DIAGNOSIS — K219 Gastro-esophageal reflux disease without esophagitis: Secondary | ICD-10-CM

## 2013-10-31 DIAGNOSIS — J387 Other diseases of larynx: Secondary | ICD-10-CM

## 2013-10-31 DIAGNOSIS — J45909 Unspecified asthma, uncomplicated: Secondary | ICD-10-CM | POA: Diagnosis not present

## 2013-10-31 DIAGNOSIS — R059 Cough, unspecified: Secondary | ICD-10-CM

## 2013-10-31 MED ORDER — HYDROCODONE-HOMATROPINE 5-1.5 MG/5ML PO SYRP: 5.0000 mL | ORAL_SOLUTION | Freq: Four times a day (QID) | ORAL | Status: DC | PRN

## 2013-10-31 NOTE — Patient Instructions (Signed)
Please continue your fluticasone nasal spray, loratadine, singulair, mucinex.  Please use nasal saline daily Please continue your nexium twice a day  Use albuterol as needed  Please use hycodan as needed for cough Follow with Dr Delton CoombesByrum in 4 months or sooner if you have any problems.

## 2013-10-31 NOTE — Assessment & Plan Note (Signed)
On PPI bid

## 2013-10-31 NOTE — Progress Notes (Signed)
Subjective:    Patient ID: [name] [name], female    DOB: 1940-12-20, 73 y.o.   MRN: [id number]  HPI 73 yo woman, never smoker, hx of migraines, GERD, A Fib, allergic rhinitis. She has frequent sinusitis, bronchitis.  Carries the dx of asthma that was made 2-3 yrs ago. She has DOE, some wheeze, cough. She has a lot of drug allergies but has been able to tolerate avelox. She completed two courses avelox recently for productive cough with clear to green mucous. She was started on symbicort 1 year ago. She is on nexium bid. She is on loratadine and fluticasone nasal spray bid. She does nasal washes rarely. She believes the Symbicort is helpful.   ROV 07/22/13 -- follows up for allergies, cough/wheeze dx as asthma. Returns today after PFT > shows moderate AFL with a BD response consistent w dx asthma. She is on fluticasone qd, loratadine, singulair. She doesn't do NSW right now. She uses symbicort bid, rare albuterol use.   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  She was dx also with B maxillary sinusitis. She is still on prednisone, but she started taking just 20mg  a day for the last 5 days (she didn't taper).  She is taking brovana + pulmicort but only once a day.   ROV 10/31/13 -- Hx GERD, allergies, cough/wheeze dx as asthma, confirmed by PFT 3/'15. We stopped her brovana and pulmicort last time because she was having throat irritation. She has been taking it sometimes depending on her breathing sx. She is having a lot of nasal congestion right now. She has run out of mucinex, is taking fluticasone nasal spray, loratadine, singulair.  Uses albuterol over the last few days. She is on nexium bid.   Review of Systems  Constitutional: Negative for fever and unexpected weight change.  HENT: Positive for congestion and postnasal drip. Negative for dental problem, ear pain, nosebleeds,  rhinorrhea, sinus pressure, sneezing, sore throat and trouble swallowing.   Eyes: Negative for redness and itching.  Respiratory: Positive for cough, chest tightness, shortness of breath and wheezing.   Cardiovascular: Negative for palpitations and leg swelling.  Gastrointestinal: Negative for nausea and vomiting.  Genitourinary: Negative for dysuria.  Musculoskeletal: Negative for joint swelling.  Skin: Negative for rash.  Neurological: Negative for headaches.  Hematological: Does not bruise/bleed easily.  Psychiatric/Behavioral: Negative for dysphoric mood. The patient is not nervous/anxious.        Objective:   Physical Exam Filed Vitals:   10/31/13 1127  BP: 130/78  Pulse: 78  Height: 5\' 6"  (1.676 m)  Weight: 209 lb (94.802 kg)  SpO2: 95%   Gen: Pleasant, well-nourished, in no distress,  normal affect  ENT: No lesions, narrow posterior pharynx with some erythema, oropharynx clear, no postnasal drip  Neck: No JVD, no TMG, no carotid bruits  Lungs: No use of accessory muscles, clear without rales or rhonchi  Cardiovascular: RRR, heart sounds normal, no murmur or gallops, no peripheral edema  Musculoskeletal: No deformities, no cyanosis or clubbing  Neuro: alert, non focal  Skin: Warm, no lesions or rashes     Assessment & Plan:  LPRD (laryngopharyngeal reflux disease) On PPI bid  Upper airway cough syndrome Please continue your fluticasone nasal spray, loratadine, singulair, mucinex.  Please use nasal saline daily Please continue your nexium twice a day  Use albuterol as needed  Please use hycodan as  needed for cough Follow with Dr Delton CoombesByrum in 4 months or sooner if you have any problems  Asthma, mild intermittent Not currently on scheduled meds, but BD's have irritated her UA disease. Will defer starting anything new right now.

## 2013-10-31 NOTE — Assessment & Plan Note (Signed)
Not currently on scheduled meds, but BD's have irritated her UA disease. Will defer starting anything new right now.

## 2013-10-31 NOTE — Assessment & Plan Note (Signed)
Please continue your fluticasone nasal spray, loratadine, singulair, mucinex.  Please use nasal saline daily Please continue your nexium twice a day  Use albuterol as needed  Please use hycodan as needed for cough Follow with Dr Sheryl Saintil in 4 months or sooner if you have any problems.  

## 2013-11-02 ENCOUNTER — Other Ambulatory Visit: Payer: Self-pay | Admitting: Internal Medicine

## 2013-11-13 LAB — PULMONARY FUNCTION TEST
DL/VA % pred: 90 %
DL/VA: 4.57 ml/min/mmHg/L
DLCO unc % pred: 79 %
DLCO unc: 21.34 ml/min/mmHg
FEF 25-75 Post: 1.47 L/s
FEF 25-75 Pre: 0.84 L/s
FEF2575-%Change-Post: 74 %
FEF2575-%Pred-Post: 82 %
FEF2575-%Pred-Pre: 47 %
FEV1-%Change-Post: 16 %
FEV1-%Pred-Post: 81 %
FEV1-%Pred-Pre: 69 %
FEV1-Post: 1.88 L
FEV1-Pre: 1.61 L
FEV1FVC-%Change-Post: 6 %
FEV1FVC-%Pred-Pre: 85 %
FEV6-%Change-Post: 9 %
FEV6-%Pred-Post: 94 %
FEV6-%Pred-Pre: 86 %
FEV6-Post: 2.76 L
FEV6-Pre: 2.52 L
FEV6FVC-%Pred-Post: 105 %
FEV6FVC-%Pred-Pre: 105 %
FVC-%Change-Post: 9 %
FVC-%Pred-Post: 89 %
FVC-%Pred-Pre: 82 %
FVC-Post: 2.76 L
FVC-Pre: 2.52 L
Post FEV1/FVC ratio: 68 %
Post FEV6/FVC ratio: 100 %
Pre FEV1/FVC ratio: 64 %
Pre FEV6/FVC Ratio: 100 %
RV % pred: 124 %
RV: 2.98 L
TLC % pred: 105 %
TLC: 5.65 L

## 2013-11-15 ENCOUNTER — Other Ambulatory Visit: Payer: Self-pay | Admitting: Internal Medicine

## 2013-11-27 ENCOUNTER — Ambulatory Visit (INDEPENDENT_AMBULATORY_CARE_PROVIDER_SITE_OTHER): Payer: Medicare Other | Admitting: Family Medicine

## 2013-11-27 DIAGNOSIS — I4891 Unspecified atrial fibrillation: Secondary | ICD-10-CM | POA: Diagnosis not present

## 2013-11-27 DIAGNOSIS — Z5181 Encounter for therapeutic drug level monitoring: Secondary | ICD-10-CM | POA: Diagnosis not present

## 2013-11-27 LAB — POCT INR: INR: 2.4

## 2013-12-09 ENCOUNTER — Other Ambulatory Visit: Payer: Self-pay | Admitting: Internal Medicine

## 2013-12-13 ENCOUNTER — Other Ambulatory Visit: Payer: Self-pay | Admitting: Internal Medicine

## 2013-12-18 ENCOUNTER — Other Ambulatory Visit: Payer: Self-pay | Admitting: Internal Medicine

## 2013-12-24 ENCOUNTER — Telehealth: Payer: Self-pay | Admitting: *Deleted

## 2013-12-24 DIAGNOSIS — IMO0001 Reserved for inherently not codable concepts without codable children: Secondary | ICD-10-CM

## 2013-12-24 DIAGNOSIS — E1165 Type 2 diabetes mellitus with hyperglycemia: Principal | ICD-10-CM

## 2013-12-24 NOTE — Telephone Encounter (Signed)
done

## 2013-12-24 NOTE — Telephone Encounter (Signed)
Left msg on triage stating was told at last visit to have her A1C check in Sept no orders in computer. Pls advise...[name]

## 2013-12-25 ENCOUNTER — Other Ambulatory Visit (INDEPENDENT_AMBULATORY_CARE_PROVIDER_SITE_OTHER): Payer: Medicare Other

## 2013-12-25 ENCOUNTER — Encounter: Payer: Self-pay | Admitting: Internal Medicine

## 2013-12-25 ENCOUNTER — Ambulatory Visit (INDEPENDENT_AMBULATORY_CARE_PROVIDER_SITE_OTHER): Payer: Medicare Other | Admitting: *Deleted

## 2013-12-25 DIAGNOSIS — Z5181 Encounter for therapeutic drug level monitoring: Secondary | ICD-10-CM

## 2013-12-25 DIAGNOSIS — IMO0001 Reserved for inherently not codable concepts without codable children: Secondary | ICD-10-CM | POA: Diagnosis not present

## 2013-12-25 DIAGNOSIS — I4891 Unspecified atrial fibrillation: Secondary | ICD-10-CM | POA: Diagnosis not present

## 2013-12-25 DIAGNOSIS — E1165 Type 2 diabetes mellitus with hyperglycemia: Principal | ICD-10-CM

## 2013-12-25 LAB — HEMOGLOBIN A1C: Hgb A1c MFr Bld: 5.9 % (ref 4.6–6.5)

## 2013-12-25 LAB — BASIC METABOLIC PANEL
BUN: 14 mg/dL (ref 6–23)
CALCIUM: 8.9 mg/dL (ref 8.4–10.5)
CO2: 26 meq/L (ref 19–32)
Chloride: 104 mEq/L (ref 96–112)
Creatinine, Ser: 0.8 mg/dL (ref 0.4–1.2)
GFR: 77.61 mL/min (ref >60.00)
Glucose, Bld: 94 mg/dL (ref 70–99)
Potassium: 4.1 mEq/L (ref 3.5–5.1)
SODIUM: 137 meq/L (ref 135–145)

## 2013-12-25 LAB — POCT INR: INR: 2

## 2013-12-25 NOTE — Telephone Encounter (Signed)
Notified pt md has place orders.../lmb 

## 2013-12-30 ENCOUNTER — Other Ambulatory Visit: Payer: Self-pay | Admitting: Internal Medicine

## 2014-01-22 ENCOUNTER — Ambulatory Visit (INDEPENDENT_AMBULATORY_CARE_PROVIDER_SITE_OTHER): Payer: Medicare Other | Admitting: *Deleted

## 2014-01-22 DIAGNOSIS — I4891 Unspecified atrial fibrillation: Secondary | ICD-10-CM | POA: Diagnosis not present

## 2014-01-22 DIAGNOSIS — Z23 Encounter for immunization: Secondary | ICD-10-CM

## 2014-01-22 DIAGNOSIS — Z5181 Encounter for therapeutic drug level monitoring: Secondary | ICD-10-CM

## 2014-01-22 LAB — POCT INR: INR: 2.2

## 2014-02-18 ENCOUNTER — Other Ambulatory Visit: Payer: Self-pay | Admitting: Internal Medicine

## 2014-02-18 DIAGNOSIS — Z1231 Encounter for screening mammogram for malignant neoplasm of breast: Secondary | ICD-10-CM

## 2014-02-19 ENCOUNTER — Ambulatory Visit (INDEPENDENT_AMBULATORY_CARE_PROVIDER_SITE_OTHER): Payer: Medicare Other | Admitting: *Deleted

## 2014-02-19 DIAGNOSIS — I4891 Unspecified atrial fibrillation: Secondary | ICD-10-CM

## 2014-02-19 DIAGNOSIS — Z5181 Encounter for therapeutic drug level monitoring: Secondary | ICD-10-CM | POA: Diagnosis not present

## 2014-02-19 LAB — POCT INR: INR: 2.8

## 2014-02-28 ENCOUNTER — Encounter: Payer: Self-pay | Admitting: Emergency Medicine

## 2014-02-28 ENCOUNTER — Ambulatory Visit (INDEPENDENT_AMBULATORY_CARE_PROVIDER_SITE_OTHER): Payer: Medicare Other | Admitting: Emergency Medicine

## 2014-02-28 VITALS — BP 98/86 | HR 76 | Ht 66.0 in | Wt 217.6 lb

## 2014-02-28 DIAGNOSIS — J4520 Mild intermittent asthma, uncomplicated: Secondary | ICD-10-CM | POA: Diagnosis not present

## 2014-02-28 NOTE — Progress Notes (Signed)
Subjective:    Patient ID: [name] [name], female    DOB: [birth year], 73 y.o.   MRN: [id number]  HPI 73 yo woman, never smoker, hx of migraines, GERD, A Fib, allergic rhinitis. She has frequent sinusitis, bronchitis.  Carries the dx of asthma that was made 2-3 yrs ago. She has DOE, some wheeze, cough. She has a lot of drug allergies but has been able to tolerate avelox. She completed two courses avelox recently for productive cough with clear to green mucous. She was started on symbicort 1 year ago. She is on nexium bid. She is on loratadine and fluticasone nasal spray bid. She does nasal washes rarely. She believes the Symbicort is helpful.   ROV 07/22/13 -- follows up for allergies, cough/wheeze dx as asthma. Returns today after PFT > shows moderate AFL with a BD response consistent w dx asthma. She is on fluticasone qd, loratadine, singulair. She doesn't do NSW right now. She uses symbicort bid, rare albuterol use.   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  She was dx also with B maxillary sinusitis. She is still on prednisone, but she started taking just 20mg  a day for the last 5 days (she didn't taper).  She is taking brovana + pulmicort but only once a day.   ROV 10/31/13 -- Hx GERD, allergies, cough/wheeze dx as asthma, confirmed by PFT 3/'15. We stopped her brovana and pulmicort last time because she was having throat irritation. She has been taking it sometimes depending on her breathing sx. She is having a lot of nasal congestion right now. She has run out of mucinex, is taking fluticasone nasal spray, loratadine, singulair.  Uses albuterol over the last few days. She is on nexium bid.   ROV 02/28/14 -- follow up visit for asthma, chronic cough, GERD and PND. She is using symbicort qam, not in the evening. She has occasional cough.   Review of Systems  Constitutional:  Negative for fever and unexpected weight change.  HENT: Positive for congestion and postnasal drip. Negative for dental problem, ear pain, nosebleeds, rhinorrhea, sinus pressure, sneezing, sore throat and trouble swallowing.   Eyes: Negative for redness and itching.  Respiratory: Positive for cough, chest tightness, shortness of breath and wheezing.   Cardiovascular: Negative for palpitations and leg swelling.  Gastrointestinal: Negative for nausea and vomiting.  Genitourinary: Negative for dysuria.  Musculoskeletal: Negative for joint swelling.  Skin: Negative for rash.  Neurological: Negative for headaches.  Hematological: Does not bruise/bleed easily.  Psychiatric/Behavioral: Negative for dysphoric mood. The patient is not nervous/anxious.        Objective:   Physical Exam Filed Vitals:   02/28/14 1008  BP: 98/86  Pulse: 76  Height: 5\' 6"  (1.676 m)  Weight: 217 lb 9.6 oz (98.703 kg)  SpO2: 98%   Gen: Pleasant, well-nourished, in no distress,  normal affect  ENT: No lesions, narrow posterior pharynx with some erythema, oropharynx clear, no postnasal drip  Neck: No JVD, no TMG, no carotid bruits  Lungs: No use of accessory muscles, clear without rales or rhonchi  Cardiovascular: RRR, heart sounds normal, no murmur or gallops, no peripheral edema  Musculoskeletal: No deformities, no cyanosis or clubbing  Neuro: alert, non focal  Skin: Warm, no lesions or rashes     Assessment & Plan:  Asthma, mild intermittent She has been stable since last visit, still coughs some but  better than Spring. She rarely uses albuterol, when she does it helps. We will continue symbicort qam, albuterol prn. Continue allergy and GERD regimen.

## 2014-02-28 NOTE — Assessment & Plan Note (Signed)
She has been stable since last visit, still coughs some but better than Spring. She rarely uses albuterol, when she does it helps. We will continue symbicort qam, albuterol prn. Continue allergy and GERD regimen.

## 2014-02-28 NOTE — Patient Instructions (Signed)
Please continue your Symbicort 2 puffs every morning. Remember to rinse and gargle after using Continue your Nexium, loratadine, fluticasone nose spray Follow with Dr Delton CoombesByrum in 6 months or sooner if you have any problems

## 2014-03-03 ENCOUNTER — Other Ambulatory Visit: Payer: Self-pay | Admitting: *Deleted

## 2014-03-03 DIAGNOSIS — H25813 Combined forms of age-related cataract, bilateral: Secondary | ICD-10-CM | POA: Diagnosis not present

## 2014-03-03 MED ORDER — BUDESONIDE-FORMOTEROL FUMARATE 160-4.5 MCG/ACT IN AERO: 2.0000 | INHALATION_SPRAY | Freq: Two times a day (BID) | RESPIRATORY_TRACT | Status: DC

## 2014-03-03 NOTE — Telephone Encounter (Signed)
Please advise refill for Symbicort inhaler. Rx has been decontinued from pt's med list.

## 2014-03-05 ENCOUNTER — Ambulatory Visit (HOSPITAL_COMMUNITY)
Admission: RE | Admit: 2014-03-05 | Discharge: 2014-03-05 | Disposition: A | Discharge status: Home / Self Care | Payer: Medicare Other | Source: Ambulatory Visit | Attending: Internal Medicine | Admitting: Internal Medicine

## 2014-03-05 DIAGNOSIS — Z1231 Encounter for screening mammogram for malignant neoplasm of breast: Secondary | ICD-10-CM | POA: Insufficient documentation

## 2014-03-06 LAB — HM MAMMOGRAPHY: HM MAMMO: NORMAL

## 2014-04-02 ENCOUNTER — Telehealth: Payer: Self-pay | Admitting: Family

## 2014-04-02 ENCOUNTER — Ambulatory Visit (INDEPENDENT_AMBULATORY_CARE_PROVIDER_SITE_OTHER): Payer: Medicare Other

## 2014-04-02 DIAGNOSIS — I4891 Unspecified atrial fibrillation: Secondary | ICD-10-CM | POA: Diagnosis not present

## 2014-04-02 DIAGNOSIS — Z5181 Encounter for therapeutic drug level monitoring: Secondary | ICD-10-CM | POA: Diagnosis not present

## 2014-04-02 LAB — POCT INR: INR: 1.8

## 2014-04-02 NOTE — Telephone Encounter (Signed)
Agree with plan 

## 2014-04-16 ENCOUNTER — Ambulatory Visit: Payer: Medicare Other | Admitting: Family Medicine

## 2014-04-16 DIAGNOSIS — I4891 Unspecified atrial fibrillation: Secondary | ICD-10-CM

## 2014-04-16 DIAGNOSIS — Z5181 Encounter for therapeutic drug level monitoring: Secondary | ICD-10-CM

## 2014-04-16 LAB — POCT INR: INR: 2.9

## 2014-05-03 ENCOUNTER — Other Ambulatory Visit: Payer: Self-pay | Admitting: Internal Medicine

## 2014-05-20 ENCOUNTER — Ambulatory Visit (INDEPENDENT_AMBULATORY_CARE_PROVIDER_SITE_OTHER): Payer: Medicare Other

## 2014-05-20 DIAGNOSIS — Z5181 Encounter for therapeutic drug level monitoring: Secondary | ICD-10-CM | POA: Diagnosis not present

## 2014-05-20 DIAGNOSIS — I4891 Unspecified atrial fibrillation: Secondary | ICD-10-CM | POA: Diagnosis not present

## 2014-05-20 LAB — POCT INR: INR: 2.5

## 2014-06-17 ENCOUNTER — Ambulatory Visit (INDEPENDENT_AMBULATORY_CARE_PROVIDER_SITE_OTHER): Payer: Medicare Other | Admitting: General Practice

## 2014-06-17 DIAGNOSIS — Z5181 Encounter for therapeutic drug level monitoring: Secondary | ICD-10-CM | POA: Diagnosis not present

## 2014-06-17 DIAGNOSIS — I4891 Unspecified atrial fibrillation: Secondary | ICD-10-CM

## 2014-06-17 LAB — POCT INR: INR: 2

## 2014-06-17 NOTE — Progress Notes (Signed)
Agree with plan 

## 2014-06-17 NOTE — Progress Notes (Signed)
Pre visit review using our clinic review tool, if applicable. No additional management support is needed unless otherwise documented below in the visit note. 

## 2014-06-18 ENCOUNTER — Encounter: Payer: Self-pay | Admitting: Internal Medicine

## 2014-06-18 ENCOUNTER — Ambulatory Visit (INDEPENDENT_AMBULATORY_CARE_PROVIDER_SITE_OTHER): Payer: Medicare Other | Admitting: Internal Medicine

## 2014-06-18 VITALS — BP 160/80 | HR 66 | Temp 98.2°F | Ht 66.0 in | Wt 215.8 lb

## 2014-06-18 DIAGNOSIS — E118 Type 2 diabetes mellitus with unspecified complications: Secondary | ICD-10-CM | POA: Diagnosis not present

## 2014-06-18 DIAGNOSIS — K2100 Gastro-esophageal reflux disease with esophagitis, without bleeding: Secondary | ICD-10-CM

## 2014-06-18 DIAGNOSIS — I480 Paroxysmal atrial fibrillation: Secondary | ICD-10-CM

## 2014-06-18 DIAGNOSIS — I10 Essential (primary) hypertension: Secondary | ICD-10-CM

## 2014-06-18 DIAGNOSIS — E785 Hyperlipidemia, unspecified: Secondary | ICD-10-CM | POA: Diagnosis not present

## 2014-06-18 DIAGNOSIS — E038 Other specified hypothyroidism: Secondary | ICD-10-CM | POA: Diagnosis not present

## 2014-06-18 DIAGNOSIS — J4520 Mild intermittent asthma, uncomplicated: Secondary | ICD-10-CM | POA: Diagnosis not present

## 2014-06-18 DIAGNOSIS — Z23 Encounter for immunization: Secondary | ICD-10-CM

## 2014-06-18 MED ORDER — WARFARIN SODIUM 5 MG PO TABS: 5.0000 mg | ORAL_TABLET | Freq: Every day | ORAL | Status: DC

## 2014-06-18 MED ORDER — MONTELUKAST SODIUM 10 MG PO TABS: 10.0000 mg | ORAL_TABLET | Freq: Every day | ORAL | Status: DC

## 2014-06-18 MED ORDER — OLMESARTAN MEDOXOMIL 40 MG PO TABS: 40.0000 mg | ORAL_TABLET | Freq: Every day | ORAL | Status: DC

## 2014-06-18 MED ORDER — FLUTICASONE PROPIONATE 50 MCG/ACT NA SUSP: 2.0000 | Freq: Every day | NASAL | Status: DC

## 2014-06-18 MED ORDER — METFORMIN HCL ER 500 MG PO TB24: ORAL_TABLET | ORAL | Status: DC

## 2014-06-18 MED ORDER — ESOMEPRAZOLE MAGNESIUM 40 MG PO CPDR: 40.0000 mg | DELAYED_RELEASE_CAPSULE | Freq: Two times a day (BID) | ORAL | Status: DC

## 2014-06-18 MED ORDER — BUDESONIDE-FORMOTEROL FUMARATE 160-4.5 MCG/ACT IN AERO: 2.0000 | INHALATION_SPRAY | Freq: Two times a day (BID) | RESPIRATORY_TRACT | Status: DC

## 2014-06-18 NOTE — Patient Instructions (Signed)
Hypothyroidism The thyroid is a large gland located in the lower front of your neck. The thyroid gland helps control metabolism. Metabolism is how your body handles food. It controls metabolism with the hormone thyroxine. When this gland is underactive (hypothyroid), it produces too little hormone.  CAUSES These include:   Absence or destruction of thyroid tissue.  Goiter due to iodine deficiency.  Goiter due to medications.  Congenital defects (since birth).  Problems with the pituitary. This causes a lack of TSH (thyroid stimulating hormone). This hormone tells the thyroid to turn out more hormone. SYMPTOMS  Lethargy (feeling as though you have no energy)  Cold intolerance  Weight gain (in spite of normal food intake)  Dry skin  Coarse hair  Menstrual irregularity (if severe, may lead to infertility)  Slowing of thought processes Cardiac problems are also caused by insufficient amounts of thyroid hormone. Hypothyroidism in the newborn is cretinism, and is an extreme form. It is important that this form be treated adequately and immediately or it will lead rapidly to retarded physical and mental development. DIAGNOSIS  To prove hypothyroidism, your caregiver may do blood tests and ultrasound tests. Sometimes the signs are hidden. It may be necessary for your caregiver to watch this illness with blood tests either before or after diagnosis and treatment. TREATMENT  Low levels of thyroid hormone are increased by using synthetic thyroid hormone. This is a safe, effective treatment. It usually takes about four weeks to gain the full effects of the medication. After you have the full effect of the medication, it will generally take another four weeks for problems to leave. Your caregiver may start you on low doses. If you have had heart problems the dose may be gradually increased. It is generally not an emergency to get rapidly to normal. HOME CARE INSTRUCTIONS   Take your  medications as your caregiver suggests. Let your caregiver know of any medications you are taking or start taking. Your caregiver will help you with dosage schedules.  As your condition improves, your dosage needs may increase. It will be necessary to have continuing blood tests as suggested by your caregiver.  Report all suspected medication side effects to your caregiver. SEEK MEDICAL CARE IF: Seek medical care if you develop:  Sweating.  Tremulousness (tremors).  Anxiety.  Rapid weight loss.  Heat intolerance.  Emotional swings.  Diarrhea.  Weakness. SEEK IMMEDIATE MEDICAL CARE IF:  You develop chest pain, an irregular heart beat (palpitations), or a rapid heart beat. MAKE SURE YOU:   Understand these instructions.  Will watch your condition.  Will get help right away if you are not doing well or get worse. Document Released: 04/11/2005 Document Revised: 07/04/2011 Document Reviewed: 11/30/2007 Exit[employer] Patient Information 2015 [employer]. This information is not intended to replace advice given to you by your health care provider. Make sure you discuss any questions you have with your health care provider.  

## 2014-06-18 NOTE — Progress Notes (Signed)
Pre visit review using our clinic review tool, if applicable. No additional management support is needed unless otherwise documented below in the visit note. 

## 2014-06-19 ENCOUNTER — Telehealth: Payer: Self-pay | Admitting: Internal Medicine

## 2014-06-19 ENCOUNTER — Other Ambulatory Visit (INDEPENDENT_AMBULATORY_CARE_PROVIDER_SITE_OTHER): Payer: Medicare Other

## 2014-06-19 DIAGNOSIS — E038 Other specified hypothyroidism: Secondary | ICD-10-CM

## 2014-06-19 DIAGNOSIS — E118 Type 2 diabetes mellitus with unspecified complications: Secondary | ICD-10-CM | POA: Diagnosis not present

## 2014-06-19 DIAGNOSIS — E785 Hyperlipidemia, unspecified: Secondary | ICD-10-CM

## 2014-06-19 DIAGNOSIS — I10 Essential (primary) hypertension: Secondary | ICD-10-CM

## 2014-06-19 LAB — COMPREHENSIVE METABOLIC PANEL
ALT: 14 U/L (ref 0–35)
AST: 16 U/L (ref 0–37)
Albumin: 3.9 g/dL (ref 3.5–5.2)
Alkaline Phosphatase: 88 U/L (ref 39–117)
BUN: 19 mg/dL (ref 6–23)
CALCIUM: 9.4 mg/dL (ref 8.4–10.5)
CHLORIDE: 103 meq/L (ref 96–112)
CO2: 29 meq/L (ref 19–32)
Creatinine, Ser: 0.86 mg/dL (ref 0.40–1.20)
GFR: 68.23 mL/min (ref >60.00)
GLUCOSE: 105 mg/dL — AB (ref 70–99)
Potassium: 4.3 mEq/L (ref 3.5–5.1)
Sodium: 137 mEq/L (ref 135–145)
Total Bilirubin: 0.5 mg/dL (ref 0.2–1.2)
Total Protein: 7.1 g/dL (ref 6.0–8.3)

## 2014-06-19 LAB — LIPID PANEL
Cholesterol: 208 mg/dL — ABNORMAL HIGH (ref 0–200)
HDL: 61.7 mg/dL (ref >39.00)
LDL CALC: 126 mg/dL — AB (ref 0–99)
NonHDL: 146.3
TRIGLYCERIDES: 100 mg/dL (ref 0.0–149.0)
Total CHOL/HDL Ratio: 3
VLDL: 20 mg/dL (ref 0.0–40.0)

## 2014-06-19 LAB — CBC WITH DIFFERENTIAL/PLATELET
Basophils Absolute: 0 10*3/uL (ref 0.0–0.1)
Basophils Relative: 0.5 % (ref 0.0–3.0)
Eosinophils Absolute: 0.4 10*3/uL (ref 0.0–0.7)
Eosinophils Relative: 5.5 % — ABNORMAL HIGH (ref 0.0–5.0)
HCT: 37.7 % (ref 36.0–46.0)
Hemoglobin: 12.8 g/dL (ref 12.0–15.0)
Lymphocytes Relative: 20.3 % (ref 12.0–46.0)
Lymphs Abs: 1.6 10*3/uL (ref 0.7–4.0)
MCHC: 34 g/dL (ref 30.0–36.0)
MCV: 86.7 fl (ref 78.0–100.0)
MONOS PCT: 7 % (ref 3.0–12.0)
Monocytes Absolute: 0.5 10*3/uL (ref 0.1–1.0)
NEUTROS PCT: 66.7 % (ref 43.0–77.0)
Neutro Abs: 5.2 10*3/uL (ref 1.4–7.7)
PLATELETS: 300 10*3/uL (ref 150.0–400.0)
RBC: 4.35 Mil/uL (ref 3.87–5.11)
RDW: 14.9 % (ref 11.5–15.5)
WBC: 7.8 10*3/uL (ref 4.0–10.5)

## 2014-06-19 LAB — HEMOGLOBIN A1C: HEMOGLOBIN A1C: 6.1 % (ref 4.6–6.5)

## 2014-06-19 LAB — TSH: TSH: 6.28 u[IU]/mL — ABNORMAL HIGH (ref 0.35–4.50)

## 2014-06-19 MED ORDER — LEVOTHYROXINE SODIUM 25 MCG PO TABS: 25.0000 ug | ORAL_TABLET | Freq: Every day | ORAL | Status: DC

## 2014-06-19 NOTE — Assessment & Plan Note (Signed)
I will recheck her TSH and will adjust her dose if needed 

## 2014-06-19 NOTE — Progress Notes (Signed)
Subjective:    Patient ID: [name] [name], female    DOB: April 17, 1941, 74 y.o.   MRN: [id number]  Diabetes Pertinent negatives for hypoglycemia include no dizziness, headaches, sweats or tremors. Pertinent negatives for diabetes include no blurred vision, no chest pain and no fatigue.  Hypertension This is a chronic problem. The current episode started more than 1 year ago. The problem is unchanged. The problem is controlled. Pertinent negatives include no anxiety, blurred vision, chest pain, headaches, malaise/fatigue, neck pain, orthopnea, palpitations, peripheral edema, PND, shortness of breath or sweats. Past treatments include angiotensin blockers. The current treatment provides moderate improvement. Compliance problems include exercise and diet.   Hyperlipidemia Pertinent negatives include no chest pain or shortness of breath.      Review of Systems  Constitutional: Negative.  Negative for fever, chills, malaise/fatigue, diaphoresis, appetite change and fatigue.  HENT: Negative.  Negative for tinnitus, trouble swallowing and voice change.   Eyes: Negative.  Negative for blurred vision.  Respiratory: Positive for wheezing. Negative for cough, choking, chest tightness, shortness of breath and stridor.   Cardiovascular: Negative.  Negative for chest pain, palpitations, orthopnea, leg swelling and PND.  Gastrointestinal: Negative.  Negative for abdominal pain, diarrhea and constipation.  Endocrine: Negative.   Genitourinary: Negative.   Musculoskeletal: Negative.  Negative for back pain, arthralgias, gait problem and neck pain.  Skin: Negative.  Negative for rash.  Allergic/Immunologic: Negative.   Neurological: Negative.  Negative for dizziness, tremors, syncope, light-headedness and headaches.  Hematological: Negative.  Negative for adenopathy. Does not bruise/bleed easily.  Psychiatric/Behavioral: Negative.        Objective:   Physical Exam  Constitutional: She is  oriented to person, place, and time. She appears well-developed and well-nourished. No distress.  HENT:  Head: Normocephalic and atraumatic.  Mouth/Throat: Oropharynx is clear and moist. No oropharyngeal exudate.  Eyes: Conjunctivae are normal. Right eye exhibits no discharge. Left eye exhibits no discharge. No scleral icterus.  Neck: Normal range of motion. Neck supple. No JVD present. No tracheal deviation present. No thyromegaly present.  Cardiovascular: Normal rate, regular rhythm, normal heart sounds and intact distal pulses.  Exam reveals no gallop and no friction rub.   No murmur heard. Pulmonary/Chest: Effort normal. No accessory muscle usage or stridor. No respiratory distress. She has no decreased breath sounds. She has wheezes in the right lower field and the left lower field. She has no rhonchi. She has no rales. She exhibits no tenderness.  Abdominal: Soft. Bowel sounds are normal. She exhibits no distension and no mass. There is no tenderness. There is no rebound and no guarding.  Musculoskeletal: Normal range of motion. She exhibits no edema or tenderness.  Lymphadenopathy:    She has no cervical adenopathy.  Neurological: She is oriented to person, place, and time.  Skin: Skin is warm and dry. No rash noted. She is not diaphoretic. No erythema. No pallor.  Vitals reviewed.    Lab Results  Component Value Date   WBC 10.4 08/25/2013   HGB 12.7 08/25/2013   HCT 38.3 08/25/2013   PLT 337 08/25/2013   GLUCOSE 94 12/25/2013   CHOL 144 01/29/2013   TRIG 61.0 01/29/2013   HDL 63.50 01/29/2013   LDLCALC 68 01/29/2013   ALT 16 08/20/2013   AST 19 08/20/2013   NA 137 12/25/2013   K 4.1 12/25/2013   CL 104 12/25/2013   CREATININE 0.8 12/25/2013   BUN 14 12/25/2013   CO2 26 12/25/2013   TSH 4.05 08/29/2013  INR 2.0 06/17/2014   HGBA1C 5.9 12/25/2013   MICROALBUR 1.2 09/28/2012       Assessment & Plan:

## 2014-06-19 NOTE — Assessment & Plan Note (Signed)
Her BP is well controlled Will monitor her lytes and renal function 

## 2014-06-19 NOTE — Assessment & Plan Note (Signed)
She has good rate and rhythm control Will cont coumadin

## 2014-06-19 NOTE — Assessment & Plan Note (Signed)
She is doing well on metformin Will recheck her A1C and will monitor her renal fucntion

## 2014-06-19 NOTE — Telephone Encounter (Signed)
emmi emailed °

## 2014-06-23 ENCOUNTER — Other Ambulatory Visit: Payer: Self-pay | Admitting: Internal Medicine

## 2014-06-23 ENCOUNTER — Encounter: Payer: Self-pay | Admitting: Internal Medicine

## 2014-06-23 DIAGNOSIS — E038 Other specified hypothyroidism: Secondary | ICD-10-CM

## 2014-07-28 ENCOUNTER — Other Ambulatory Visit: Payer: Self-pay | Admitting: Internal Medicine

## 2014-07-28 ENCOUNTER — Other Ambulatory Visit: Payer: Self-pay | Admitting: General Practice

## 2014-07-28 DIAGNOSIS — I480 Paroxysmal atrial fibrillation: Secondary | ICD-10-CM

## 2014-07-28 MED ORDER — WARFARIN SODIUM 5 MG PO TABS: ORAL_TABLET | ORAL | Status: DC

## 2014-07-29 ENCOUNTER — Ambulatory Visit (INDEPENDENT_AMBULATORY_CARE_PROVIDER_SITE_OTHER): Payer: Medicare Other | Admitting: General Practice

## 2014-07-29 ENCOUNTER — Other Ambulatory Visit: Payer: Self-pay | Admitting: Internal Medicine

## 2014-07-29 DIAGNOSIS — Z5181 Encounter for therapeutic drug level monitoring: Secondary | ICD-10-CM

## 2014-07-29 LAB — POCT INR: INR: 2.5

## 2014-07-29 NOTE — Progress Notes (Signed)
Agree with plan 

## 2014-07-29 NOTE — Progress Notes (Signed)
Pre visit review using our clinic review tool, if applicable. No additional management support is needed unless otherwise documented below in the visit note. 

## 2014-09-10 ENCOUNTER — Ambulatory Visit (INDEPENDENT_AMBULATORY_CARE_PROVIDER_SITE_OTHER): Payer: Medicare Other | Admitting: General Practice

## 2014-09-10 DIAGNOSIS — I4891 Unspecified atrial fibrillation: Secondary | ICD-10-CM | POA: Diagnosis not present

## 2014-09-10 DIAGNOSIS — Z5181 Encounter for therapeutic drug level monitoring: Secondary | ICD-10-CM

## 2014-09-10 LAB — POCT INR: INR: 1.7

## 2014-09-10 NOTE — Progress Notes (Signed)
Pre visit review using our clinic review tool, if applicable. No additional management support is needed unless otherwise documented below in the visit note. 

## 2014-09-10 NOTE — Progress Notes (Signed)
Please place next visit date. Thanks.

## 2014-09-10 NOTE — Progress Notes (Signed)
Agree with plan 

## 2014-10-08 ENCOUNTER — Ambulatory Visit: Payer: Medicare Other

## 2014-10-20 ENCOUNTER — Other Ambulatory Visit: Payer: Self-pay

## 2014-11-18 ENCOUNTER — Ambulatory Visit (INDEPENDENT_AMBULATORY_CARE_PROVIDER_SITE_OTHER): Payer: Medicare Other | Admitting: General Practice

## 2014-11-18 ENCOUNTER — Ambulatory Visit (INDEPENDENT_AMBULATORY_CARE_PROVIDER_SITE_OTHER): Payer: Medicare Other | Admitting: Internal Medicine

## 2014-11-18 ENCOUNTER — Other Ambulatory Visit (INDEPENDENT_AMBULATORY_CARE_PROVIDER_SITE_OTHER): Payer: Medicare Other

## 2014-11-18 VITALS — BP 122/74 | HR 71 | Temp 98.2°F | Ht 66.0 in | Wt 200.0 lb

## 2014-11-18 DIAGNOSIS — N3281 Overactive bladder: Secondary | ICD-10-CM

## 2014-11-18 DIAGNOSIS — J4520 Mild intermittent asthma, uncomplicated: Secondary | ICD-10-CM | POA: Diagnosis not present

## 2014-11-18 DIAGNOSIS — I480 Paroxysmal atrial fibrillation: Secondary | ICD-10-CM

## 2014-11-18 DIAGNOSIS — E038 Other specified hypothyroidism: Secondary | ICD-10-CM | POA: Diagnosis not present

## 2014-11-18 DIAGNOSIS — J301 Allergic rhinitis due to pollen: Secondary | ICD-10-CM

## 2014-11-18 DIAGNOSIS — E118 Type 2 diabetes mellitus with unspecified complications: Secondary | ICD-10-CM

## 2014-11-18 DIAGNOSIS — I4891 Unspecified atrial fibrillation: Secondary | ICD-10-CM

## 2014-11-18 DIAGNOSIS — I359 Nonrheumatic aortic valve disorder, unspecified: Secondary | ICD-10-CM

## 2014-11-18 DIAGNOSIS — I10 Essential (primary) hypertension: Secondary | ICD-10-CM

## 2014-11-18 DIAGNOSIS — Z5181 Encounter for therapeutic drug level monitoring: Secondary | ICD-10-CM | POA: Diagnosis not present

## 2014-11-18 LAB — BASIC METABOLIC PANEL
BUN: 16 mg/dL (ref 6–23)
CALCIUM: 9.1 mg/dL (ref 8.4–10.5)
CHLORIDE: 104 meq/L (ref 96–112)
CO2: 30 mEq/L (ref 19–32)
Creatinine, Ser: 0.79 mg/dL (ref 0.40–1.20)
GFR: 75.17 mL/min (ref >60.00)
GLUCOSE: 103 mg/dL — AB (ref 70–99)
Potassium: 4 mEq/L (ref 3.5–5.1)
Sodium: 140 mEq/L (ref 135–145)

## 2014-11-18 LAB — POCT INR: INR: 1.8

## 2014-11-18 LAB — HEMOGLOBIN A1C: Hgb A1c MFr Bld: 5.6 % (ref 4.6–6.5)

## 2014-11-18 LAB — TSH: TSH: 5.93 u[IU]/mL — AB (ref 0.35–4.50)

## 2014-11-18 MED ORDER — TOLTERODINE TARTRATE ER 4 MG PO CP24: 4.0000 mg | ORAL_CAPSULE | Freq: Every day | ORAL | Status: DC

## 2014-11-18 MED ORDER — ALBUTEROL SULFATE HFA 108 (90 BASE) MCG/ACT IN AERS: 2.0000 | INHALATION_SPRAY | Freq: Four times a day (QID) | RESPIRATORY_TRACT | Status: DC | PRN

## 2014-11-18 MED ORDER — FLUTICASONE PROPIONATE 50 MCG/ACT NA SUSP: 2.0000 | Freq: Every day | NASAL | Status: DC

## 2014-11-18 NOTE — Progress Notes (Signed)
Subjective:  Patient ID: [name] [name], female    DOB: 1940-09-19  Age: 74 y.o. MRN: [id number]  CC: Diabetes and Hypothyroidism   HPI [name] [name] presents for a routine follow-up but she also complains that over the last year her shortness of breath and dyspnea on exertion has worsened. She has persistent allergy symptoms with a rare cough that's productive productive of clear phlegm. She has nasal congestion and itchy throat. She does not report any chest pain, dizziness, diaphoresis or near syncope.  Outpatient Prescriptions Prior to Visit  Medication Sig Dispense Refill  . budesonide-formoterol (SYMBICORT) 160-4.5 MCG/ACT inhaler Inhale 2 puffs into the lungs 2 (two) times daily. 3 Inhaler 3  . calcium-vitamin D (OSCAL WITH D 500-200) 500-200 MG-UNIT per tablet Take 2 tablets by mouth daily.     Marland Kitchen dextromethorphan-guaiFENesin (MUCINEX DM) 30-600 MG per 12 hr tablet Take 2 tablets by mouth daily.    Marland Kitchen esomeprazole (NEXIUM) 40 MG capsule Take 1 capsule (40 mg total) by mouth 2 (two) times daily before a meal. 180 capsule 3  . loratadine (CLARITIN) 10 MG tablet Take 10 mg by mouth daily.    . metFORMIN (GLUCOPHAGE-XR) 500 MG 24 hr tablet TAKE 1 TABLET BY MOUTH ONCE DAILY WITH BREAKFAST 90 tablet 1  . montelukast (SINGULAIR) 10 MG tablet Take 1 tablet (10 mg total) by mouth at bedtime. 90 tablet 3  . montelukast (SINGULAIR) 10 MG tablet TAKE 1 TABLET AT BEDTIME 90 tablet 3  . sodium chloride (OCEAN) 0.65 % SOLN nasal spray Place 2 sprays into both nostrils 4 (four) times daily. 90 mL 0  . warfarin (COUMADIN) 5 MG tablet Take as directed by anticoagulation clinic 40 tablet 3  . albuterol (PROAIR HFA) 108 (90 BASE) MCG/ACT inhaler Inhale 2 puffs into the lungs every 6 (six) hours as needed for wheezing or shortness of breath. 8.5 each 11  . fluticasone (FLONASE) 50 MCG/ACT nasal spray Place 2 sprays into both nostrils daily. 48 g 3  . levothyroxine (SYNTHROID, LEVOTHROID)  25 MCG tablet Take 1 tablet (25 mcg total) by mouth daily before breakfast. 90 tablet 1  . olmesartan (BENICAR) 40 MG tablet Take 1 tablet (40 mg total) by mouth daily. 90 tablet 3  . warfarin (COUMADIN) 5 MG tablet TAKE 1 TABLET BY MOUTH ONCE DAILY OR AS DIRECTED BY COUMADIN CLINIC 140 tablet 0   No facility-administered medications prior to visit.    ROS Review of Systems  Constitutional: Positive for fatigue. Negative for chills, diaphoresis and appetite change.  HENT: Positive for congestion, postnasal drip and rhinorrhea. Negative for facial swelling, nosebleeds, sinus pressure, sore throat, trouble swallowing and voice change.   Eyes: Negative.   Respiratory: Positive for cough and shortness of breath. Negative for apnea, choking, chest tightness and stridor.   Cardiovascular: Negative.  Negative for chest pain, palpitations and leg swelling.  Gastrointestinal: Negative.  Negative for nausea, vomiting, abdominal pain, diarrhea, constipation and blood in stool.  Endocrine: Negative.   Genitourinary: Negative.   Musculoskeletal: Negative.   Skin: Negative.  Negative for rash.  Allergic/Immunologic: Negative.   Neurological: Negative.  Negative for dizziness, syncope, speech difficulty, weakness, light-headedness, numbness and headaches.  Hematological: Negative.  Negative for adenopathy. Does not bruise/bleed easily.  Psychiatric/Behavioral: Negative.     Objective:  BP 122/74 mmHg  Pulse 71  Temp(Src) 98.2 F (36.8 C) (Oral)  Ht  (1.676 m)  Wt 200 lb (90.719 kg)  BMI 32.30 kg/m2  SpO2 92%  LMP 12/01/2010  BP Readings from Last 3 Encounters:  11/18/14 122/74  06/18/14 160/80  02/28/14 98/86    Wt Readings from Last 3 Encounters:  11/18/14 200 lb (90.719 kg)  06/18/14 215 lb 12 oz (97.864 kg)  02/28/14 217 lb 9.6 oz (98.703 kg)    Physical Exam  Constitutional: She is oriented to person, place, and time. No distress.  HENT:  Mouth/Throat: Oropharynx is clear  and moist. No oropharyngeal exudate.  Eyes: Conjunctivae are normal. Right eye exhibits no discharge. Left eye exhibits no discharge. No scleral icterus.  Neck: Normal range of motion. Neck supple. No JVD present. No tracheal deviation present. No thyromegaly present.  Cardiovascular: Normal rate, regular rhythm, S1 normal, S2 normal and intact distal pulses.  Exam reveals no gallop and no friction rub.   Murmur heard.  Systolic murmur is present   No diastolic murmur is present  Pulmonary/Chest: Effort normal and breath sounds normal. No stridor. No respiratory distress. She has no wheezes. She has no rales. She exhibits no tenderness.  Abdominal: Soft. Bowel sounds are normal. She exhibits no distension and no mass. There is no tenderness. There is no rebound and no guarding.  Musculoskeletal: Normal range of motion. She exhibits no edema or tenderness.  Lymphadenopathy:    She has no cervical adenopathy.  Neurological: She is oriented to person, place, and time.  Skin: Skin is warm and dry. No rash noted. She is not diaphoretic. No erythema. No pallor.  Psychiatric: She has a normal mood and affect. Her behavior is normal. Judgment and thought content normal.    Lab Results  Component Value Date   WBC 7.8 06/19/2014   HGB 12.8 06/19/2014   HCT 37.7 06/19/2014   PLT 300.0 06/19/2014   GLUCOSE 103* 11/18/2014   CHOL 208* 06/19/2014   TRIG 100.0 06/19/2014   HDL 61.70 06/19/2014   LDLCALC 126* 06/19/2014   ALT 14 06/19/2014   AST 16 06/19/2014   NA 140 11/18/2014   K 4.0 11/18/2014   CL 104 11/18/2014   CREATININE 0.79 11/18/2014   BUN 16 11/18/2014   CO2 30 11/18/2014   TSH 5.93* 11/18/2014   INR 1.8 11/18/2014   HGBA1C 5.6 11/18/2014   MICROALBUR 1.2 09/28/2012    Mm Screening Breast Tomo Bilateral  03/06/2014   CLINICAL DATA:  Screening.  EXAM: DIGITAL SCREENING BILATERAL MAMMOGRAM WITH 3D TOMO WITH CAD  COMPARISON:  Previous exam(s).  ACR Breast Density Category b:  There are scattered areas of fibroglandular density.  FINDINGS: There are no findings suspicious for malignancy. Images were processed with CAD.  IMPRESSION: No mammographic evidence of malignancy. A result letter of this screening mammogram will be mailed directly to the patient.  RECOMMENDATION: Screening mammogram in one year. (Code:SM-B-01Y)  BI-RADS CATEGORY  1: Negative.   Electronically Signed   By: Britta Mccreedy M.D.   On: 03/06/2014 15:40    Assessment & Plan:   Ghalia was seen today for diabetes and hypothyroidism.  Diagnoses and all orders for this visit:  Other specified hypothyroidism- her TSH is slightly elevated so I have increased her Synthroid dose. Orders: -     TSH; Future -     levothyroxine (SYNTHROID) 50 MCG tablet; Take 1 tablet (50 mcg total) by mouth daily before breakfast.  Type II diabetes mellitus with manifestations- her blood sugars are well controlled. Orders: -     Basic metabolic panel; Future -     Hemoglobin A1c; Future  OAB (overactive  bladder) Orders: -     tolterodine (DETROL LA) 4 MG 24 hr capsule; Take 1 capsule (4 mg total) by mouth daily.  Asthma, mild intermittent, uncomplicated- this is well controlled. Orders: -     albuterol (PROAIR HFA) 108 (90 BASE) MCG/ACT inhaler; Inhale 2 puffs into the lungs every 6 (six) hours as needed for wheezing or shortness of breath.  Essential hypertension- blood pressure is well-controlled, electrolytes and renal function are stable.  Allergic rhinitis due to pollen Orders: -     fluticasone (FLONASE) 50 MCG/ACT nasal spray; Place 2 sprays into both nostrils daily.  Aortic valve disorder- I am concerned that this may be causing her dyspnea on exertion and have asked her to have a follow-up with cardiology. Orders: -     Ambulatory referral to Cardiology  Paroxysmal atrial fibrillation- she has good rate and rhythm control. Orders: -     Ambulatory referral to Cardiology   I have discontinued [name] [name]'s olmesartan and levothyroxine. I am also having her start on tolterodine and levothyroxine. Additionally, I am having her maintain her calcium-vitamin D, loratadine, sodium chloride, dextromethorphan-guaiFENesin, metFORMIN, esomeprazole, budesonide-formoterol, montelukast, warfarin, montelukast, albuterol, and fluticasone.  Meds ordered this encounter  Medications  . albuterol (PROAIR HFA) 108 (90 BASE) MCG/ACT inhaler    Sig: Inhale 2 puffs into the lungs every 6 (six) hours as needed for wheezing or shortness of breath.    Dispense:  8.5 each    Refill:  11  . fluticasone (FLONASE) 50 MCG/ACT nasal spray    Sig: Place 2 sprays into both nostrils daily.    Dispense:  48 g    Refill:  3  . tolterodine (DETROL LA) 4 MG 24 hr capsule    Sig: Take 1 capsule (4 mg total) by mouth daily.    Dispense:  90 capsule    Refill:  3  . levothyroxine (SYNTHROID) 50 MCG tablet    Sig: Take 1 tablet (50 mcg total) by mouth daily before breakfast.    Dispense:  90 tablet    Refill:  1     Follow-up: Return in about 4 months (around 03/21/2015).  Sanda Linger, MD

## 2014-11-18 NOTE — Patient Instructions (Signed)
Hypothyroidism The thyroid is a large gland located in the lower front of your neck. The thyroid gland helps control metabolism. Metabolism is how your body handles food. It controls metabolism with the hormone thyroxine. When this gland is underactive (hypothyroid), it produces too little hormone.  CAUSES These include:   Absence or destruction of thyroid tissue.  Goiter due to iodine deficiency.  Goiter due to medications.  Congenital defects (since birth).  Problems with the pituitary. This causes a lack of TSH (thyroid stimulating hormone). This hormone tells the thyroid to turn out more hormone. SYMPTOMS  Lethargy (feeling as though you have no energy)  Cold intolerance  Weight gain (in spite of normal food intake)  Dry skin  Coarse hair  Menstrual irregularity (if severe, may lead to infertility)  Slowing of thought processes Cardiac problems are also caused by insufficient amounts of thyroid hormone. Hypothyroidism in the newborn is cretinism, and is an extreme form. It is important that this form be treated adequately and immediately or it will lead rapidly to retarded physical and mental development. DIAGNOSIS  To prove hypothyroidism, your caregiver may do blood tests and ultrasound tests. Sometimes the signs are hidden. It may be necessary for your caregiver to watch this illness with blood tests either before or after diagnosis and treatment. TREATMENT  Low levels of thyroid hormone are increased by using synthetic thyroid hormone. This is a safe, effective treatment. It usually takes about four weeks to gain the full effects of the medication. After you have the full effect of the medication, it will generally take another four weeks for problems to leave. Your caregiver may start you on low doses. If you have had heart problems the dose may be gradually increased. It is generally not an emergency to get rapidly to normal. HOME CARE INSTRUCTIONS   Take your  medications as your caregiver suggests. Let your caregiver know of any medications you are taking or start taking. Your caregiver will help you with dosage schedules.  As your condition improves, your dosage needs may increase. It will be necessary to have continuing blood tests as suggested by your caregiver.  Report all suspected medication side effects to your caregiver. SEEK MEDICAL CARE IF: Seek medical care if you develop:  Sweating.  Tremulousness (tremors).  Anxiety.  Rapid weight loss.  Heat intolerance.  Emotional swings.  Diarrhea.  Weakness. SEEK IMMEDIATE MEDICAL CARE IF:  You develop chest pain, an irregular heart beat (palpitations), or a rapid heart beat. MAKE SURE YOU:   Understand these instructions.  Will watch your condition.  Will get help right away if you are not doing well or get worse. Document Released: 04/11/2005 Document Revised: 07/04/2011 Document Reviewed: 11/30/2007 Exit[employer] Patient Information 2015 [employer]. This information is not intended to replace advice given to you by your health care provider. Make sure you discuss any questions you have with your health care provider.  

## 2014-11-18 NOTE — Progress Notes (Signed)
Pre visit review using our clinic review tool, if applicable. No additional management support is needed unless otherwise documented below in the visit note. 

## 2014-11-18 NOTE — Progress Notes (Signed)
I have reviewed and agree with the plan. 

## 2014-11-19 ENCOUNTER — Telehealth: Payer: Self-pay | Admitting: Cardiology

## 2014-11-19 ENCOUNTER — Encounter: Payer: Self-pay | Admitting: Internal Medicine

## 2014-11-19 MED ORDER — LEVOTHYROXINE SODIUM 50 MCG PO TABS
50.0000 ug | ORAL_TABLET | Freq: Every day | ORAL | Status: DC
Start: 2014-11-19 — End: 2015-02-03

## 2014-11-19 NOTE — Telephone Encounter (Signed)
[name] [name] is having shortness of breath and Dyspnea on exertion . Please call   Thanks

## 2014-11-19 NOTE — Telephone Encounter (Signed)
Pt reports she was seen by Dr. Yetta Barre (primary) yesterday for productive cough, exertional dyspnea.  Pt good historian of her medical concerns;  Has had issue of recurrent sinus infections which tend to turn into pneumonia.   Pt notes she has had same symptoms in the past w/ this illness, notes she typically has problems in hot weather. Hospitalized ~1 yr ago.  She notes she was not put on antibiotics yesterday - has longstanding history w/ multiple allergies. Was recommended flonase and albuterol.  Amb referral put in for Cardiology r/t hx of aortic valve d/o - PCP's concern for this involvement in her dyspnea.  She has seen Dr. Jens Som in past, husband is [facility] patient.  I recommended return to PCP or UC if symptoms do not improve - will route to Dr. Jens Som for any further recommendations.

## 2014-11-19 NOTE — Telephone Encounter (Signed)
Schedule fu elective ov Olga Millers

## 2014-11-19 NOTE — Telephone Encounter (Signed)
Willaim Sheng called me - had this in her work queue as amb referral from Dr. Yetta Barre office - I communicated Dr. Ludwig Clarks recommendation to her. She will call pt to set up appt.

## 2014-11-24 ENCOUNTER — Ambulatory Visit: Payer: Medicare Other | Admitting: Physician Assistant

## 2014-12-02 ENCOUNTER — Other Ambulatory Visit: Payer: Self-pay | Admitting: General Practice

## 2014-12-02 ENCOUNTER — Telehealth: Payer: Self-pay

## 2014-12-02 DIAGNOSIS — I480 Paroxysmal atrial fibrillation: Secondary | ICD-10-CM

## 2014-12-02 MED ORDER — WARFARIN SODIUM 5 MG PO TABS: ORAL_TABLET | ORAL | Status: DC

## 2014-12-02 NOTE — Telephone Encounter (Signed)
Received refill request from [facility]  request refills for [place]  .

## 2014-12-16 ENCOUNTER — Ambulatory Visit (INDEPENDENT_AMBULATORY_CARE_PROVIDER_SITE_OTHER): Payer: Medicare Other | Admitting: General Practice

## 2014-12-16 DIAGNOSIS — I4891 Unspecified atrial fibrillation: Secondary | ICD-10-CM

## 2014-12-16 DIAGNOSIS — Z5181 Encounter for therapeutic drug level monitoring: Secondary | ICD-10-CM

## 2014-12-16 LAB — POCT INR: INR: 1.6

## 2014-12-16 NOTE — Progress Notes (Signed)
I have reviewed and agree with the plan. 

## 2014-12-16 NOTE — Progress Notes (Signed)
Pre visit review using our clinic review tool, if applicable. No additional management support is needed unless otherwise documented below in the visit note. 

## 2015-01-13 ENCOUNTER — Ambulatory Visit: Payer: Medicare Other

## 2015-01-20 ENCOUNTER — Encounter (HOSPITAL_COMMUNITY): Payer: Self-pay | Admitting: *Deleted

## 2015-01-20 ENCOUNTER — Emergency Department (HOSPITAL_COMMUNITY): Payer: Medicare Other

## 2015-01-20 ENCOUNTER — Inpatient Hospital Stay (HOSPITAL_COMMUNITY)
Admission: EM | Admit: 2015-01-20 | Discharge: 2015-01-24 | DRG: 202 | Disposition: A | Discharge status: Home / Self Care | Payer: Medicare Other | Attending: Internal Medicine | Admitting: Internal Medicine

## 2015-01-20 DIAGNOSIS — Z23 Encounter for immunization: Secondary | ICD-10-CM | POA: Diagnosis not present

## 2015-01-20 DIAGNOSIS — E039 Hypothyroidism, unspecified: Secondary | ICD-10-CM | POA: Diagnosis present

## 2015-01-20 DIAGNOSIS — K219 Gastro-esophageal reflux disease without esophagitis: Secondary | ICD-10-CM | POA: Diagnosis present

## 2015-01-20 DIAGNOSIS — Z833 Family history of diabetes mellitus: Secondary | ICD-10-CM

## 2015-01-20 DIAGNOSIS — Z8249 Family history of ischemic heart disease and other diseases of the circulatory system: Secondary | ICD-10-CM | POA: Diagnosis not present

## 2015-01-20 DIAGNOSIS — Y92009 Unspecified place in unspecified non-institutional (private) residence as the place of occurrence of the external cause: Secondary | ICD-10-CM

## 2015-01-20 DIAGNOSIS — R600 Localized edema: Secondary | ICD-10-CM | POA: Diagnosis present

## 2015-01-20 DIAGNOSIS — I10 Essential (primary) hypertension: Secondary | ICD-10-CM | POA: Diagnosis present

## 2015-01-20 DIAGNOSIS — I4891 Unspecified atrial fibrillation: Secondary | ICD-10-CM | POA: Diagnosis present

## 2015-01-20 DIAGNOSIS — Z7901 Long term (current) use of anticoagulants: Secondary | ICD-10-CM | POA: Diagnosis not present

## 2015-01-20 DIAGNOSIS — T380X6A Underdosing of glucocorticoids and synthetic analogues, initial encounter: Secondary | ICD-10-CM | POA: Diagnosis present

## 2015-01-20 DIAGNOSIS — Z888 Allergy status to other drugs, medicaments and biological substances status: Secondary | ICD-10-CM

## 2015-01-20 DIAGNOSIS — I480 Paroxysmal atrial fibrillation: Secondary | ICD-10-CM | POA: Diagnosis present

## 2015-01-20 DIAGNOSIS — R0789 Other chest pain: Secondary | ICD-10-CM | POA: Diagnosis not present

## 2015-01-20 DIAGNOSIS — J9601 Acute respiratory failure with hypoxia: Secondary | ICD-10-CM | POA: Diagnosis present

## 2015-01-20 DIAGNOSIS — J45901 Unspecified asthma with (acute) exacerbation: Secondary | ICD-10-CM | POA: Diagnosis not present

## 2015-01-20 DIAGNOSIS — M1990 Unspecified osteoarthritis, unspecified site: Secondary | ICD-10-CM | POA: Diagnosis present

## 2015-01-20 DIAGNOSIS — T45516A Underdosing of anticoagulants, initial encounter: Secondary | ICD-10-CM | POA: Diagnosis present

## 2015-01-20 DIAGNOSIS — Z880 Allergy status to penicillin: Secondary | ICD-10-CM

## 2015-01-20 DIAGNOSIS — E785 Hyperlipidemia, unspecified: Secondary | ICD-10-CM | POA: Diagnosis present

## 2015-01-20 DIAGNOSIS — M79606 Pain in leg, unspecified: Secondary | ICD-10-CM | POA: Diagnosis not present

## 2015-01-20 DIAGNOSIS — Z825 Family history of asthma and other chronic lower respiratory diseases: Secondary | ICD-10-CM | POA: Diagnosis not present

## 2015-01-20 DIAGNOSIS — E119 Type 2 diabetes mellitus without complications: Secondary | ICD-10-CM | POA: Diagnosis present

## 2015-01-20 DIAGNOSIS — E669 Obesity, unspecified: Secondary | ICD-10-CM | POA: Diagnosis present

## 2015-01-20 DIAGNOSIS — R0602 Shortness of breath: Secondary | ICD-10-CM | POA: Diagnosis not present

## 2015-01-20 DIAGNOSIS — R079 Chest pain, unspecified: Secondary | ICD-10-CM | POA: Diagnosis not present

## 2015-01-20 DIAGNOSIS — Z86010 Personal history of colonic polyps: Secondary | ICD-10-CM

## 2015-01-20 DIAGNOSIS — Z6832 Body mass index (BMI) 32.0-32.9, adult: Secondary | ICD-10-CM

## 2015-01-20 DIAGNOSIS — Z91120 Patient's intentional underdosing of medication regimen due to financial hardship: Secondary | ICD-10-CM | POA: Diagnosis not present

## 2015-01-20 DIAGNOSIS — I5189 Other ill-defined heart diseases: Secondary | ICD-10-CM

## 2015-01-20 DIAGNOSIS — J449 Chronic obstructive pulmonary disease, unspecified: Secondary | ICD-10-CM | POA: Diagnosis present

## 2015-01-20 DIAGNOSIS — T486X6A Underdosing of antiasthmatics, initial encounter: Secondary | ICD-10-CM | POA: Diagnosis present

## 2015-01-20 DIAGNOSIS — R0600 Dyspnea, unspecified: Secondary | ICD-10-CM | POA: Diagnosis not present

## 2015-01-20 DIAGNOSIS — I351 Nonrheumatic aortic (valve) insufficiency: Secondary | ICD-10-CM | POA: Diagnosis present

## 2015-01-20 DIAGNOSIS — E118 Type 2 diabetes mellitus with unspecified complications: Secondary | ICD-10-CM

## 2015-01-20 LAB — CBC
HCT: 43.2 % (ref 36.0–46.0)
Hemoglobin: 14 g/dL (ref 12.0–15.0)
MCH: 29.2 pg (ref 26.0–34.0)
MCHC: 32.4 g/dL (ref 30.0–36.0)
MCV: 90 fL (ref 78.0–100.0)
PLATELETS: 322 10*3/uL (ref 150–400)
RBC: 4.8 MIL/uL (ref 3.87–5.11)
RDW: 13.9 % (ref 11.5–15.5)
WBC: 10.3 10*3/uL (ref 4.0–10.5)

## 2015-01-20 LAB — BASIC METABOLIC PANEL
Anion gap: 10 (ref 5–15)
BUN: 7 mg/dL (ref 6–20)
CHLORIDE: 97 mmol/L — AB (ref 101–111)
CO2: 29 mmol/L (ref 22–32)
Calcium: 9 mg/dL (ref 8.9–10.3)
Creatinine, Ser: 0.79 mg/dL (ref 0.44–1.00)
GFR calc Af Amer: >60 mL/min (ref >60)
GFR calc non Af Amer: >60 mL/min (ref >60)
Glucose, Bld: 124 mg/dL — ABNORMAL HIGH (ref 65–99)
Potassium: 3.8 mmol/L (ref 3.5–5.1)
Sodium: 136 mmol/L (ref 135–145)

## 2015-01-20 LAB — BRAIN NATRIURETIC PEPTIDE: B Natriuretic Peptide: 48.5 pg/mL (ref 0.0–100.0)

## 2015-01-20 LAB — PROTIME-INR
INR: 1.16 (ref 0.00–1.49)
Prothrombin Time: 15 seconds (ref 11.6–15.2)

## 2015-01-20 LAB — I-STAT TROPONIN, ED: TROPONIN I, POC: 0 ng/mL (ref 0.00–0.08)

## 2015-01-20 MED ORDER — ALBUTEROL (5 MG/ML) CONTINUOUS INHALATION SOLN
10.0000 mg/h | INHALATION_SOLUTION | RESPIRATORY_TRACT | Status: DC
  Administered 2015-01-20: 10 mg/h via RESPIRATORY_TRACT
  Filled 2015-01-20: qty 20

## 2015-01-20 MED ORDER — METHYLPREDNISOLONE SODIUM SUCC 125 MG IJ SOLR
125.0000 mg | Freq: Once | INTRAMUSCULAR | Status: AC
  Administered 2015-01-20: 125 mg via INTRAVENOUS
  Filled 2015-01-20: qty 2

## 2015-01-20 MED ORDER — ALBUTEROL SULFATE (2.5 MG/3ML) 0.083% IN NEBU
5.0000 mg | INHALATION_SOLUTION | Freq: Once | RESPIRATORY_TRACT | Status: AC
  Administered 2015-01-20: 5 mg via RESPIRATORY_TRACT
  Filled 2015-01-20: qty 6

## 2015-01-20 MED ORDER — IPRATROPIUM-ALBUTEROL 0.5-2.5 (3) MG/3ML IN SOLN
3.0000 mL | Freq: Once | RESPIRATORY_TRACT | Status: DC
  Filled 2015-01-20: qty 3

## 2015-01-20 MED ORDER — IPRATROPIUM-ALBUTEROL 0.5-2.5 (3) MG/3ML IN SOLN
3.0000 mL | Freq: Once | RESPIRATORY_TRACT | Status: AC
  Administered 2015-01-20: 3 mL via RESPIRATORY_TRACT
  Filled 2015-01-20: qty 3

## 2015-01-20 MED ORDER — IPRATROPIUM-ALBUTEROL 0.5-2.5 (3) MG/3ML IN SOLN: 3.0000 mL | Freq: Three times a day (TID) | RESPIRATORY_TRACT | Status: DC

## 2015-01-20 NOTE — ED Provider Notes (Signed)
CSN: [id number]     Arrival date & time 01/20/15  1824 History   First MD Initiated Contact with Patient 01/20/15 1856     Chief Complaint  Patient presents with  . Shortness of Breath     (Consider location/radiation/quality/duration/timing/severity/associated sxs/prior Treatment) HPI   Patient is a very pleasant 15 her old female with history of asthma and COPD presenting as shortness of breath. One week ago she cleaned out her basement since then she's been getting increasingly short of breath. She's been using her albuterol increasingly at home. Patient's son went to find patient today found to have blue lips. On arrival here she is 81% on room air. Patient has no oxygen at home.  Past Medical History  Diagnosis Date  . Migraine, unspecified, without mention of intractable migraine without mention of status migrainosus   . HLD (hyperlipidemia)   . Aortic insufficiency   . Colon polyps   . Hypothyroidism   . GERD (gastroesophageal reflux disease)   . Osteoarthritis   . HTN (hypertension)   . Depression   . PAF (paroxysmal atrial fibrillation)     on coumadin and Cardiazem  . Asthma   . Allergic rhinitis   . Chronic anticoagulation     on coumadin  . Obesity   . Diastolic dysfunction   . S/P cardiac cath 2003    no obstructive disease    Past Surgical History  Procedure Laterality Date  . Appendectomy    . Abdominal hysterectomy    . Tubal ligation    . US echocardiography  2007   Family History  Problem Relation Age of Onset  . Heart disease    . Hypertension    . COPD    . Crohn's disease    . Diabetes    . Depression    . Dementia     Social History  Substance Use Topics  . Smoking status: Never Smoker   . Smokeless tobacco: Never Used  . Alcohol Use: 3.0 oz/week    5 Glasses of wine per week   OB History    No data available     Review of Systems  Constitutional: Negative for activity change and fatigue.  HENT: Negative for congestion and  drooling.   Eyes: Negative for discharge.  Respiratory: Positive for chest tightness and shortness of breath. Negative for cough.   Cardiovascular: Positive for leg swelling. Negative for chest pain.  Gastrointestinal: Negative for abdominal distention.  Genitourinary: Negative for dysuria and difficulty urinating.  Musculoskeletal: Negative for joint swelling.  Skin: Negative for rash.  Allergic/Immunologic: Negative for immunocompromised state.  Neurological: Negative for seizures and speech difficulty.  Psychiatric/Behavioral: Negative for behavioral problems and agitation.      Allergies  Crestor; Penicillins; Tetracycline; Cardizem; Acetaminophen; Cefaclor; Cephalexin; Clindamycin; Ibuprofen; Nsaids; Pravastatin sodium; Simvastatin; Sulfonamide derivatives; Avelox; and Levaquin  Home Medications   Prior to Admission medications   Medication Sig Start Date End Date Taking? Authorizing Provider  albuterol (PROAIR HFA) 108 (90 BASE) MCG/ACT inhaler Inhale 2 puffs into the lungs every 6 (six) hours as needed for wheezing or shortness of breath. 11/18/14   Etta Grandchild, MD  budesonide-formoterol [facility]) 160-4.5 MCG/ACT inhaler Inhale 2 puffs into the lungs 2 (two) times daily. 06/18/14   Etta Grandchild, MD  calcium-vitamin D (OSCAL WITH D 500-200) 500-200 MG-UNIT per tablet Take 2 tablets by mouth daily.     Historical Provider, MD  dextromethorphan-guaiFENesin (MUCINEX DM) 30-600 MG per 12 hr tablet Take  2 tablets by mouth daily. 08/26/13   Joseph Art, DO  esomeprazole (NEXIUM) 40 MG capsule Take 1 capsule (40 mg total) by mouth 2 (two) times daily before a meal. 06/18/14   Etta Grandchild, MD  fluticasone [facility]) 50 MCG/ACT nasal spray Place 2 sprays into both nostrils daily. 11/18/14   Etta Grandchild, MD  levothyroxine (SYNTHROID) 50 MCG tablet Take 1 tablet (50 mcg total) by mouth daily before breakfast. 11/19/14   Etta Grandchild, MD  loratadine (CLARITIN) 10 MG tablet Take 10 mg  by mouth daily.    Historical Provider, MD  metFORMIN (GLUCOPHAGE-XR) 500 MG 24 hr tablet TAKE 1 TABLET BY MOUTH ONCE DAILY WITH BREAKFAST 06/18/14   Etta Grandchild, MD  montelukast (SINGULAIR) 10 MG tablet Take 1 tablet (10 mg total) by mouth at bedtime. 06/18/14   Etta Grandchild, MD  montelukast (SINGULAIR) 10 MG tablet TAKE 1 TABLET AT BEDTIME 07/29/14   Etta Grandchild, MD  sodium chloride (OCEAN) 0.65 % SOLN nasal spray Place 2 sprays into both nostrils 4 (four) times daily. 08/26/13   Joseph Art, DO  tolterodine (DETROL LA) 4 MG 24 hr capsule Take 1 capsule (4 mg total) by mouth daily. 11/18/14   Etta Grandchild, MD  warfarin (COUMADIN) 5 MG tablet Take as directed by anticoagulation clinic 12/02/14   Etta Grandchild, MD   BP 231/90 mmHg  Pulse 85  Temp(Src) 97.6 F (36.4 C) (Oral)  Resp 20  SpO2 98%  LMP 12/01/2010 Physical Exam  Constitutional: She is oriented to person, place, and time. She appears well-developed and well-nourished.  HENT:  Head: Normocephalic and atraumatic.  Eyes: Conjunctivae are normal. Right eye exhibits no discharge.  Neck: Neck supple.  Cardiovascular: Normal rate, regular rhythm and normal heart sounds.   No murmur heard. Pulmonary/Chest: She has wheezes. She has rales.  Increased work of breathing.  Abdominal: Soft. She exhibits no distension. There is no tenderness.  Musculoskeletal: Normal range of motion. She exhibits no edema.  Neurological: She is oriented to person, place, and time. No cranial nerve deficit.  Skin: Skin is warm and dry. No rash noted. She is not diaphoretic.  Psychiatric: She has a normal mood and affect. Her behavior is normal.  Nursing note and vitals reviewed.   ED Course  Procedures (including critical care time) Labs Review Labs Reviewed  BASIC METABOLIC PANEL  CBC  BRAIN NATRIURETIC PEPTIDE  PROTIME-INR    Imaging Review Dg Chest Portable 1 View  01/20/2015   CLINICAL DATA:  Shortness of breath with right-sided chest  pain for 1 week  EXAM: PORTABLE CHEST 1 VIEW  COMPARISON:  Aug 25, 2013  FINDINGS: There is no edema or consolidation. The heart size and pulmonary vascularity are normal. No pneumothorax. No adenopathy. No bone lesions.  IMPRESSION: No edema or consolidation.   Electronically Signed   By: Bretta Bang III M.D.   On: 01/20/2015 19:04   I have personally reviewed and evaluated these images and lab results as part of my medical decision-making.   EKG Interpretation   Date/Time:  Tuesday January 20 2015 18:56:11 EDT Ventricular Rate:  79 PR Interval:  153 QRS Duration: 83 QT Interval:  395 QTC Calculation: 453 R Axis:   55 Text Interpretation:  Sinus rhythm Probable anteroseptal infarct, old no  acute ischemia No significant change since last tracing Confirmed by  Kandis Mannan ([id number]) on 01/20/2015 7:00:53 PM      MDM  Final diagnoses:  None    Patient is a 28 her old female presenting with shortness of breath. Patient had by her mental exposure to increased [place] last week. Since then she's having increasing shortness of breath. Suspect COPD/asthma exacerbation. We'll treat with Solu-Medrol and give continuous do nebs. Patient is very wheezy on exam.  Patient will likely require admission given that she is hypoxic on room air.  Patietn still tachypnic after duo nebs and solumedrol. Will admit to step down. May require another round of duonebs.  CRITICAL CARE Performed by: Arlana Hove Total critical care time: 30 min Critical care time was exclusive of separately billable procedures and treating other patients. Critical care was necessary to treat or prevent imminent or life-threatening deterioration. Critical care was time spent personally by me on the following activities: development of treatment plan with patient and/or surrogate as well as nursing, discussions with consultants, evaluation of patient's response to treatment, examination of patient, obtaining  history from patient or surrogate, ordering and performing treatments and interventions, ordering and review of laboratory studies, ordering and review of radiographic studies, pulse oximetry and re-evaluation of patient's condition.   Courteney Randall An, MD 01/21/15 1610

## 2015-01-20 NOTE — H&P (Signed)
PCP:   Sanda Linger, MD   Chief Complaint:  sob  HPI: 74 yo female h/o afib on coumadin (ran out over a week ago), asthma (also ran out of symbicort and albuterol inh), htn, dm (ran out of metformin) comes in with one week of worsening sob and wheezing.  No fevers.  Coughing a lot sometimes productive.  Has mild swelling in her legs at night.  She has h/o htn but was told she didn't need any bp meds anymore.  Also told she didn't need to take her metformin anymore because her last hg a1c was good.  Pt has been calling her pcp office for refills, and has not been able to get that.  She reports she ran out of most of her meds weeks ago, and cant get refills.  She reports she sees her pcp every 3 months.  On arrival to ED she had oxygen sats in the low 80s on RA and was wheezing diffusely.  She has been given iv solumedrol and an hour long albuterol neb treatment and feels much better.  She is still on 4 liters [place] and sats are improved to 95%, she can speak to me in full sentences.  She denies any pain.  No calf pain or tenderness.    Review of Systems:  Positive and negative as per HPI otherwise all other systems are negative  Past Medical History: Past Medical History  Diagnosis Date  . Migraine, unspecified, without mention of intractable migraine without mention of status migrainosus   . HLD (hyperlipidemia)   . Aortic insufficiency   . Colon polyps   . Hypothyroidism   . GERD (gastroesophageal reflux disease)   . Osteoarthritis   . HTN (hypertension)   . Depression   . PAF (paroxysmal atrial fibrillation)     on coumadin and Cardiazem  . Asthma   . Allergic rhinitis   . Chronic anticoagulation     on coumadin  . Obesity   . Diastolic dysfunction   . S/P cardiac cath 2003    no obstructive disease    Past Surgical History  Procedure Laterality Date  . Appendectomy    . Abdominal hysterectomy    . Tubal ligation    . US echocardiography  2007    Medications: Prior to  Admission medications   Medication Sig Start Date End Date Taking? Authorizing Provider  albuterol (PROAIR HFA) 108 (90 BASE) MCG/ACT inhaler Inhale 2 puffs into the lungs every 6 (six) hours as needed for wheezing or shortness of breath. 11/18/14  Yes Etta Grandchild, MD  esomeprazole (NEXIUM) 40 MG capsule Take 1 capsule (40 mg total) by mouth 2 (two) times daily before a meal. 06/18/14  Yes Etta Grandchild, MD  fluticasone (FLONASE) 50 MCG/ACT nasal spray Place 2 sprays into both nostrils daily. 11/18/14  Yes Etta Grandchild, MD  levothyroxine (SYNTHROID) 50 MCG tablet Take 1 tablet (50 mcg total) by mouth daily before breakfast. 11/19/14  Yes Etta Grandchild, MD  loratadine (CLARITIN) 10 MG tablet Take 10 mg by mouth daily.   Yes Historical Provider, MD  montelukast (SINGULAIR) 10 MG tablet Take 1 tablet (10 mg total) by mouth at bedtime. 06/18/14  Yes Etta Grandchild, MD  sodium chloride (OCEAN) 0.65 % SOLN nasal spray Place 2 sprays into both nostrils 4 (four) times daily. Patient taking differently: Place 2 sprays into both nostrils daily.  08/26/13  Yes Joseph Art, DO  tolterodine (DETROL LA) 4 MG 24  hr capsule Take 1 capsule (4 mg total) by mouth daily. 11/18/14  Yes Etta Grandchild, MD  warfarin (COUMADIN) 5 MG tablet Take as directed by anticoagulation clinic 12/02/14  Yes Etta Grandchild, MD  budesonide-formoterol [facility]) 160-4.5 MCG/ACT inhaler Inhale 2 puffs into the lungs 2 (two) times daily. Patient not taking: Reported on 01/20/2015 06/18/14   Etta Grandchild, MD  metFORMIN (GLUCOPHAGE-XR) 500 MG 24 hr tablet TAKE 1 TABLET BY MOUTH ONCE DAILY WITH BREAKFAST Patient not taking: Reported on 01/20/2015 06/18/14   Etta Grandchild, MD  montelukast (SINGULAIR) 10 MG tablet TAKE 1 TABLET AT BEDTIME Patient not taking: Reported on 01/20/2015 07/29/14   Etta Grandchild, MD    Allergies: Pcn, levoquin, many other all reviewed    Social History:  reports that she has never smoked. She has never used  smokeless tobacco. She reports that she drinks about 3.0 oz of alcohol per week. She reports that she does not use illicit drugs.  Family History: Family History  Problem Relation Age of Onset  . Heart disease    . Hypertension    . COPD    . Crohn's disease    . Diabetes    . Depression    . Dementia      Physical Exam: Filed Vitals:   01/20/15 2105 01/20/15 2202 01/20/15 2230 01/20/15 2300  BP: 218/62 152/62 174/64 162/71  Pulse: 91 88 92 85  Temp:      TempSrc:      Resp: SpO2: 100% 91% 95% 94%   General appearance: alert, cooperative and no distress Head: Normocephalic, without obvious abnormality, atraumatic Eyes: negative Nose: Nares normal. Septum midline. Mucosa normal. No drainage or sinus tenderness. Neck: no JVD and supple, symmetrical, trachea midline Lungs: wheezes bilaterally Heart: regular rate and rhythm, S1, S2 normal, no murmur, click, rub or gallop Abdomen: soft, non-tender; bowel sounds normal; no masses,  no organomegaly Extremities: extremities normal, atraumatic, no cyanosis or edema Pulses: 2+ and symmetric Skin: Skin color, texture, turgor normal. No rashes or lesions Neurologic: Grossly normal    Labs on Admission:   Recent Labs  01/20/15 1850  NA 136  K 3.8  CL 97*  CO2 29  GLUCOSE 124*  BUN 7  CREATININE 0.79  CALCIUM 9.0    Recent Labs  01/20/15 1850  WBC 10.3  HGB 14.0  HCT 43.2  MCV 90.0  PLT 322    Radiological Exams on Admission: Dg Chest Portable 1 View  01/20/2015   CLINICAL DATA:  Shortness of breath with right-sided chest pain for 1 week  EXAM: PORTABLE CHEST 1 VIEW  COMPARISON:  Aug 25, 2013  FINDINGS: There is no edema or consolidation. The heart size and pulmonary vascularity are normal. No pneumothorax. No adenopathy. No bone lesions.  IMPRESSION: No edema or consolidation.   Electronically Signed   By: Bretta Bang III M.D.   On: 01/20/2015 19:04   cxr reviewed and no infiltrate or  edema  Assessment/Plan  74 yo female with acute hypoxic respiratory failure secondary to asthma exacerbation  Principal Problem:   Acute respiratory failure with hypoxia-  Secondary to asthma, see below  Active Problems:   Essential hypertension-  Start on lisinipril, she denies every being on any ace inhibitor before, denies allergy.     Campath-induced atrial fibrillation-  Has been off coumadin for over a week.     Type II diabetes mellitus with manifestations-  Apparently controlled  now , but has been told to stop metformin.   Asthma exacerbation-  Iv solumedrol, freq neb.  Start on zpack.  Overall improved already. Should continue to improve over the next 24 to 48 hours.     Chronic anticoagulation-  Noted, consider resuming coumadin  Admit to telemetry bed.  Home meds need clarification.  Not clear why she is not able to get refills from PCP or if there is a noncompliance issue with patient, either way she will need refills at discharge for many of her home meds.  Kendyll Huettner,RACHAL A 01/20/2015, 11:45 PM

## 2015-01-20 NOTE — ED Notes (Signed)
Pt presents with worsening sob over past week and swelling in bilateral LE (mostly in feet) no prior hx of heart failure but does have a-fib. Pt has insp and exp wheezes. Duoneb administered.

## 2015-01-20 NOTE — ED Notes (Signed)
Pt states that her PCP took her off her BP meds about 3 months ago.

## 2015-01-20 NOTE — ED Notes (Addendum)
Patient reports progressively worsening SOB over the last week, intermittent chest pain, and mild swelling to lower legs. On exam patient O2 sats 83% with pursed lip breathing. Patient placed on [facility] and patient O2 increased to 97%, pt does not wear O2 at home. Patient states she has ran out of coumadin for hx of afib.

## 2015-01-21 DIAGNOSIS — J9601 Acute respiratory failure with hypoxia: Secondary | ICD-10-CM

## 2015-01-21 DIAGNOSIS — I4891 Unspecified atrial fibrillation: Secondary | ICD-10-CM

## 2015-01-21 DIAGNOSIS — I10 Essential (primary) hypertension: Secondary | ICD-10-CM

## 2015-01-21 DIAGNOSIS — J45901 Unspecified asthma with (acute) exacerbation: Principal | ICD-10-CM

## 2015-01-21 LAB — GLUCOSE, CAPILLARY
GLUCOSE-CAPILLARY: 191 mg/dL — AB (ref 65–99)
GLUCOSE-CAPILLARY: 194 mg/dL — AB (ref 65–99)
GLUCOSE-CAPILLARY: 165 mg/dL — AB (ref 65–99)
Glucose-Capillary: 166 mg/dL — ABNORMAL HIGH (ref 65–99)

## 2015-01-21 MED ORDER — METHYLPREDNISOLONE SODIUM SUCC 125 MG IJ SOLR
80.0000 mg | Freq: Two times a day (BID) | INTRAMUSCULAR | Status: DC
  Administered 2015-01-21: 80 mg via INTRAVENOUS
  Filled 2015-01-21: qty 2

## 2015-01-21 MED ORDER — METHYLPREDNISOLONE SODIUM SUCC 125 MG IJ SOLR
80.0000 mg | Freq: Three times a day (TID) | INTRAMUSCULAR | Status: AC
  Administered 2015-01-21 – 2015-01-22 (×5): 80 mg via INTRAVENOUS
  Filled 2015-01-21 (×5): qty 2

## 2015-01-21 MED ORDER — GUAIFENESIN-DM 100-10 MG/5ML PO SYRP
5.0000 mL | ORAL_SOLUTION | ORAL | Status: DC | PRN
  Administered 2015-01-21 – 2015-01-23 (×2): 5 mL via ORAL
  Filled 2015-01-21 (×2): qty 5

## 2015-01-21 MED ORDER — ALBUTEROL SULFATE (2.5 MG/3ML) 0.083% IN NEBU: 2.5000 mg | INHALATION_SOLUTION | Freq: Four times a day (QID) | RESPIRATORY_TRACT | Status: DC

## 2015-01-21 MED ORDER — SODIUM CHLORIDE 0.9 % IV SOLN: 250.0000 mL | INTRAVENOUS | Status: DC | PRN

## 2015-01-21 MED ORDER — IPRATROPIUM-ALBUTEROL 0.5-2.5 (3) MG/3ML IN SOLN
3.0000 mL | Freq: Four times a day (QID) | RESPIRATORY_TRACT | Status: DC
  Administered 2015-01-21 – 2015-01-24 (×13): 3 mL via RESPIRATORY_TRACT
  Filled 2015-01-21 (×15): qty 3

## 2015-01-21 MED ORDER — METOPROLOL TARTRATE 25 MG PO TABS
25.0000 mg | ORAL_TABLET | Freq: Two times a day (BID) | ORAL | Status: DC
  Administered 2015-01-21 – 2015-01-24 (×7): 25 mg via ORAL
  Filled 2015-01-21 (×7): qty 1

## 2015-01-21 MED ORDER — INFLUENZA VAC SPLIT QUAD 0.5 ML IM SUSY
0.5000 mL | PREFILLED_SYRINGE | INTRAMUSCULAR | Status: AC
  Administered 2015-01-22: 0.5 mL via INTRAMUSCULAR
  Filled 2015-01-21: qty 0.5

## 2015-01-21 MED ORDER — IPRATROPIUM BROMIDE 0.02 % IN SOLN: 0.5000 mg | Freq: Four times a day (QID) | RESPIRATORY_TRACT | Status: DC

## 2015-01-21 MED ORDER — LISINOPRIL 10 MG PO TABS
10.0000 mg | ORAL_TABLET | Freq: Every day | ORAL | Status: DC
  Administered 2015-01-21 – 2015-01-24 (×4): 10 mg via ORAL
  Filled 2015-01-21 (×4): qty 1

## 2015-01-21 MED ORDER — SODIUM CHLORIDE 0.9 % IJ SOLN: 3.0000 mL | Freq: Two times a day (BID) | INTRAMUSCULAR | Status: DC

## 2015-01-21 MED ORDER — HYDRALAZINE HCL 20 MG/ML IJ SOLN
5.0000 mg | Freq: Four times a day (QID) | INTRAMUSCULAR | Status: DC | PRN
  Administered 2015-01-21: 5 mg via INTRAVENOUS
  Filled 2015-01-21 (×2): qty 1

## 2015-01-21 MED ORDER — ALBUTEROL SULFATE (2.5 MG/3ML) 0.083% IN NEBU
2.5000 mg | INHALATION_SOLUTION | RESPIRATORY_TRACT | Status: DC | PRN
  Administered 2015-01-21: 2.5 mg via RESPIRATORY_TRACT
  Filled 2015-01-21: qty 3

## 2015-01-21 MED ORDER — BUDESONIDE-FORMOTEROL FUMARATE 160-4.5 MCG/ACT IN AERO
2.0000 | INHALATION_SPRAY | Freq: Two times a day (BID) | RESPIRATORY_TRACT | Status: DC
  Administered 2015-01-23 – 2015-01-24 (×3): 2 via RESPIRATORY_TRACT
  Filled 2015-01-21 (×2): qty 6

## 2015-01-21 MED ORDER — INSULIN ASPART 100 UNIT/ML ~~LOC~~ SOLN
0.0000 [IU] | Freq: Three times a day (TID) | SUBCUTANEOUS | Status: DC
  Administered 2015-01-21 – 2015-01-23 (×5): 2 [IU] via SUBCUTANEOUS
  Administered 2015-01-23: 1 [IU] via SUBCUTANEOUS
  Administered 2015-01-23 – 2015-01-24 (×2): 2 [IU] via SUBCUTANEOUS
  Administered 2015-01-24: 1 [IU] via SUBCUTANEOUS

## 2015-01-21 MED ORDER — SODIUM CHLORIDE 0.9 % IJ SOLN: 3.0000 mL | INTRAMUSCULAR | Status: DC | PRN

## 2015-01-21 MED ORDER — WARFARIN SODIUM 10 MG PO TABS
10.0000 mg | ORAL_TABLET | Freq: Once | ORAL | Status: AC
Start: 2015-01-21 — End: 2015-01-21
  Administered 2015-01-21: 10 mg via ORAL
  Filled 2015-01-21: qty 1

## 2015-01-21 MED ORDER — SODIUM CHLORIDE 0.9 % IJ SOLN
3.0000 mL | Freq: Two times a day (BID) | INTRAMUSCULAR | Status: DC
Start: 2015-01-21 — End: 2015-01-24
  Administered 2015-01-21 – 2015-01-24 (×8): 3 mL via INTRAVENOUS

## 2015-01-21 MED ORDER — LEVOTHYROXINE SODIUM 50 MCG PO TABS
50.0000 ug | ORAL_TABLET | Freq: Every day | ORAL | Status: DC
  Administered 2015-01-22 – 2015-01-24 (×3): 50 ug via ORAL
  Filled 2015-01-21 (×3): qty 1

## 2015-01-21 MED ORDER — WARFARIN - PHARMACIST DOSING INPATIENT
Freq: Every day | Status: DC
  Administered 2015-01-21 – 2015-01-22 (×2)

## 2015-01-21 NOTE — Progress Notes (Signed)
PROGRESS NOTE  [name] [name] WJX:[id number] DOB: 11-04-1940 DOA: 01/20/2015 PCP: Sanda Linger, MD Brief history 74 year old female with a history of paroxysmal atrial fibrillation, hyperlipidemia, hypothyroidism, asthma, hypertension, diabetes mellitus presented with 1 week history of worsening shortness of breath and nonproductive cough. The patient is also complaining of lower extremity edema which she states has been intermittently worsening. She denied any fevers, chills, chest pain, nausea, vomiting. The patient was found to have significant wheezing and hypoxemic in the emergency department. She was started on supplemental oxygen and intravenous steroids. Notably, the patient has been out of her medications for the past 2 weeks.   Assessment/Plan: Acute respiratory failure with hypoxia -Secondary to asthma exacerbation Continue intravenous IV Solu-Medrol--increased to every 8 hours -Continue supplemental oxygen -Restart Symbicort -Presently stable on 2 L Asthma exacerbation -Continue intravenous steroids -Restart Symbicort -Continue aerosolized albuterol Paroxysmal atrial fibrillation-Campath induced -presently in sinus -CHADS-VASc= 5-6 -restart coumadin diabetes mellitus type 2  -CBGs elevated probably due to steroids  -11/18/2014 hemoglobin A1c 5.6  -Previously on metformin the outpatient setting  -start novolog sliding scale Hypertension  -olmesartan recently stopped -start lisinopril -start metoprolol tartrate Hypothyroidism  -Check TSH  -Restart Synthroid   Family Communication:   Pt at beside Disposition Plan:   Home  2-3 days     Procedures/Studies: Dg Chest Portable 1 View  01/20/2015   CLINICAL DATA:  Shortness of breath with right-sided chest pain for 1 week  EXAM: PORTABLE CHEST 1 VIEW  COMPARISON:  Aug 25, 2013  FINDINGS: There is no edema or consolidation. The heart size and pulmonary vascularity are normal. No pneumothorax. No  adenopathy. No bone lesions.  IMPRESSION: No edema or consolidation.   Electronically Signed   By: Bretta Bang III M.D.   On: 01/20/2015 19:04        Subjective: Patient states that she is not breathing much better. Still complains of shortness of breath. Denies any chest pain, nausea, vomiting, diarrhea, hemoptysis, dysuria, hematuria. No fevers or chills.  Objective: Filed Vitals:   01/21/15 0931 01/21/15 1011 01/21/15 1111 01/21/15 1200  BP: 181/74 196/74 196/77   Pulse: 87  77   Temp: 98.8 F (37.1 C)  98.9 F (37.2 C)   TempSrc: Oral  Oral   Resp: 20  18   Height:     (1.676 m)  Weight:      SpO2: 97% 97% 96%     Intake/Output Summary (Last 24 hours) at 01/21/15 1504 Last data filed at 01/21/15 1354  Gross per 24 hour  Intake    303 ml  Output      0 ml  Net    303 ml   Weight change:  Exam:   General:  Pt is alert, follows commands appropriately, not in acute distress  HEENT: No icterus, No thrush, No neck mass, [place]/AT  Cardiovascular: RRR, S1/S2, no rubs, no gallops  Respiratory: Bilateral expiratory wheeze.  Abdomen: Soft/+BS, non tender, non distended, no guarding; no hepatosplenomegaly  Extremities: 1+LE edema, L>R, No lymphangitis, No petechiae, No rashes, no synovitis; no cyanosis or clubbing  Data Reviewed: Basic Metabolic Panel:  Recent Labs Lab 01/20/15 1850  NA 136  K 3.8  CL 97*  CO2 29  GLUCOSE 124*  BUN 7  CREATININE 0.79  CALCIUM 9.0   Liver Function Tests: No results for input(s): AST, ALT, ALKPHOS, BILITOT, PROT, ALBUMIN in the last 168 hours. No results for input(s): LIPASE,  AMYLASE in the last 168 hours. No results for input(s): AMMONIA in the last 168 hours. CBC:  Recent Labs Lab 01/20/15 1850  WBC 10.3  HGB 14.0  HCT 43.2  MCV 90.0  PLT 322   Cardiac Enzymes: No results for input(s): CKTOTAL, CKMB, CKMBINDEX, TROPONINI in the last 168 hours. BNP: Invalid input(s): POCBNP CBG:  Recent Labs Lab  01/21/15 0613 01/21/15 1117  GLUCAP 191* 194*    No results found for this or any previous visit (from the past 240 hour(s)).   Scheduled Meds: . budesonide-formoterol  2 puff Inhalation BID  . [START ON 01/22/2015] Influenza vac split quadrivalent PF  0.5 mL Intramuscular Tomorrow-1000  . insulin aspart  0-9 Units Subcutaneous TID WC  . ipratropium-albuterol  3 mL Nebulization Q6H  . [START ON 01/22/2015] levothyroxine  50 mcg Oral QAC breakfast  . lisinopril  10 mg Oral Daily  . methylPREDNISolone (SOLU-MEDROL) injection  80 mg Intravenous 3 times per day  . metoprolol tartrate  25 mg Oral BID  . sodium chloride  3 mL Intravenous Q12H  . sodium chloride  3 mL Intravenous Q12H   Continuous Infusions:    TAT, DAVID, DO  [facility] Pager [phone]  If 7PM-7AM, please contact night-coverage [email or web address] Password [email or web address] 01/21/2015, 3:04 PM   LOS: 1 day

## 2015-01-21 NOTE — Consult Note (Signed)
   [facility] Inpatient Consult   01/21/2015  [place] Nov 17, 1940 [id number] Patient evaluated for community based chronic disease management services with [place] [facility] Program as a benefit of patient's Medicare Insurance. Met with patient and her husband, Jan,  at bedside to explain [place] Management services. Consent form signed and folder with [place] Management information was given.   Patient expresses her difficulty affording her medications.   Patient will receive post discharge transition of care call and will be evaluated for monthly home visits for assessments and disease process education.  Left contact information and [facility] literature at bedside. Made Inpatient Case Manager aware that [place] Management following. Of note, [facility] Care Management services does not replace or interfere with any services that are arranged by inpatient case management or social work.  For additional questions or referrals please contact:   Natividad Brood, RN BSN [facility] Liaison  [phone] business mobile phone

## 2015-01-21 NOTE — Care Management Note (Signed)
Case Management Note  Patient Details  Name: ARABIA NYLUND MRN: [id number] Date of Birth: 04/28/1940  Subjective/Objective:       Admitted with Acute Respiratory Failure             Action/Plan: Patient is a retired Engineer, civil (consulting) from [employer]. Lives at home with spouse, on a fixed income ( patient stated that they made some bad investments and used up a lot of their money from their retirement). Patient has Medicare and AARP with prescription drug coverage. She states that she has been having difficulty affording the co pay for her medication and wants her Primary Md to put her on generic medication. ( She states that she takes a total of 14 meds daily). She has adult children/ daughter has helped her with her medication. CM gave patient an application for medication assistance that she and her primary physician to completed to see if she will qualify for their program. Lots of encouragement given to patient to let allow her children to help her when needed.  Expected Discharge Date:     Possibly 01/23/2015             Expected Discharge Plan:  Home/Self Care  Discharge planning Services  CM Consult      Status of Service:  In process, will continue to follow  Reola Mosher [phone] 01/21/2015, 11:03 AM

## 2015-01-21 NOTE — Progress Notes (Signed)
ANTICOAGULATION CONSULT NOTE - Initial Consult  Pharmacy Consult for Warfarin Indication: atrial fibrillation  Allergies  Allergen Reactions  . Crestor [Rosuvastatin] Other (See Comments)    Muscle aches  . Tetracycline Swelling    Swollen tongue  . Cardizem [Diltiazem Hcl]     edema  . Penicillins Hives  . Acetaminophen   . Cefaclor   . Cephalexin   . Clindamycin   . Ibuprofen   . Nsaids     Respiratory-Asthma  . Pravastatin Sodium   . Simvastatin   . Sulfonamide Derivatives   . Avelox [Moxifloxacin Hcl In Nacl] Rash  . Levaquin [Levofloxacin In D5w] Rash    Patient Measurements: Height:  (167.6 cm) Weight: 200 lb (90.719 kg) (scale c) IBW/kg (Calculated) : 59.3 Heparin Dosing Weight:   Vital Signs: Temp: 98.9 F (37.2 C) (09/28 1111) Temp Source: Oral (09/28 1111) BP: 196/77 mmHg (09/28 1111) Pulse Rate: 77 (09/28 1111)  Labs:  Recent Labs  01/20/15 1850  HGB 14.0  HCT 43.2  PLT 322  LABPROT 15.0  INR 1.16  CREATININE 0.79    Estimated Creatinine Clearance: 67.9 mL/min (by C-G formula based on Cr of 0.79).   Medical History: Past Medical History  Diagnosis Date  . Migraine, unspecified, without mention of intractable migraine without mention of status migrainosus   . HLD (hyperlipidemia)   . Aortic insufficiency   . Colon polyps   . Hypothyroidism   . GERD (gastroesophageal reflux disease)   . Osteoarthritis   . HTN (hypertension)   . Depression   . PAF (paroxysmal atrial fibrillation)     on coumadin and Cardiazem  . Asthma   . Allergic rhinitis   . Chronic anticoagulation     on coumadin  . Obesity   . Diastolic dysfunction   . S/P cardiac cath 2003    no obstructive disease     Medications:  Prescriptions prior to admission  Medication Sig Dispense Refill Last Dose  . albuterol (PROAIR HFA) 108 (90 BASE) MCG/ACT inhaler Inhale 2 puffs into the lungs every 6 (six) hours as needed for wheezing or shortness of breath. 8.5 each  11 01/20/2015 at Unknown time  . esomeprazole (NEXIUM) 40 MG capsule Take 1 capsule (40 mg total) by mouth 2 (two) times daily before a meal. 180 capsule 3 01/20/2015 at Unknown time  . fluticasone (FLONASE) 50 MCG/ACT nasal spray Place 2 sprays into both nostrils daily. 48 g 3 01/20/2015 at Unknown time  . levothyroxine (SYNTHROID) 50 MCG tablet Take 1 tablet (50 mcg total) by mouth daily before breakfast. 90 tablet 1 01/20/2015 at Unknown time  . loratadine (CLARITIN) 10 MG tablet Take 10 mg by mouth daily.   01/20/2015 at Unknown time  . montelukast (SINGULAIR) 10 MG tablet Take 1 tablet (10 mg total) by mouth at bedtime. 90 tablet 3 01/19/2015 at Unknown time  . sodium chloride (OCEAN) 0.65 % SOLN nasal spray Place 2 sprays into both nostrils 4 (four) times daily. (Patient taking differently: Place 2 sprays into both nostrils daily. ) 90 mL 0 01/20/2015 at Unknown time  . tolterodine (DETROL LA) 4 MG 24 hr capsule Take 1 capsule (4 mg total) by mouth daily. 90 capsule 3 01/19/2015 at Unknown time  . warfarin (COUMADIN) 5 MG tablet Take as directed by anticoagulation clinic 40 tablet 3 10 days  . budesonide-formoterol (SYMBICORT) 160-4.5 MCG/ACT inhaler Inhale 2 puffs into the lungs 2 (two) times daily. (Patient not taking: Reported on 01/20/2015) 3 Inhaler  3 Not Taking at Unknown time  . metFORMIN (GLUCOPHAGE-XR) 500 MG 24 hr tablet TAKE 1 TABLET BY MOUTH ONCE DAILY WITH BREAKFAST (Patient not taking: Reported on 01/20/2015) 90 tablet 1 Not Taking at Unknown time  . montelukast (SINGULAIR) 10 MG tablet TAKE 1 TABLET AT BEDTIME (Patient not taking: Reported on 01/20/2015) 90 tablet 3 Not Taking at Unknown time   Scheduled:  . budesonide-formoterol  2 puff Inhalation BID  . [START ON 01/22/2015] Influenza vac split quadrivalent PF  0.5 mL Intramuscular Tomorrow-1000  . insulin aspart  0-9 Units Subcutaneous TID WC  . ipratropium-albuterol  3 mL Nebulization Q6H  . [START ON 01/22/2015] levothyroxine  50 mcg  Oral QAC breakfast  . lisinopril  10 mg Oral Daily  . methylPREDNISolone (SOLU-MEDROL) injection  80 mg Intravenous 3 times per day  . metoprolol tartrate  25 mg Oral BID  . sodium chloride  3 mL Intravenous Q12H  . sodium chloride  3 mL Intravenous Q12H   Infusions:    Assessment: 74yo female with history of PAF on warfarin, HLD, HTN, DM2 and asthma presented with worsening SOB. Pharmacy is consulted to dose warfarin for atrial fibrillation. INR 1.16, CBC wnl, sCr 0.8.  PTA Warfarin Dose:  Mon/Fri and 7.5mg  AODs Pt reports being out of medication for 10 days on admission.  Goal of Therapy:  INR 2-3 Monitor platelets by anticoagulation protocol: Yes   Plan:  Warfarin  tonight x1 Daily INR/CBC Monitor s/sx of bleeding  Arlean Hopping. Newman Pies, PharmD Clinical Pharmacist Pager [phone] 01/21/2015,3:07 PM

## 2015-01-21 NOTE — Progress Notes (Signed)
B/P elevated 196/74. Dr. Arbutus Leas notified. New orders noted.

## 2015-01-22 ENCOUNTER — Inpatient Hospital Stay (HOSPITAL_COMMUNITY): Payer: Medicare Other

## 2015-01-22 DIAGNOSIS — Z7901 Long term (current) use of anticoagulants: Secondary | ICD-10-CM

## 2015-01-22 DIAGNOSIS — R0600 Dyspnea, unspecified: Secondary | ICD-10-CM

## 2015-01-22 DIAGNOSIS — M79606 Pain in leg, unspecified: Secondary | ICD-10-CM

## 2015-01-22 DIAGNOSIS — J45901 Unspecified asthma with (acute) exacerbation: Secondary | ICD-10-CM | POA: Diagnosis not present

## 2015-01-22 LAB — GLUCOSE, CAPILLARY
GLUCOSE-CAPILLARY: 152 mg/dL — AB (ref 65–99)
GLUCOSE-CAPILLARY: 145 mg/dL — AB (ref 65–99)
Glucose-Capillary: 159 mg/dL — ABNORMAL HIGH (ref 65–99)
Glucose-Capillary: 159 mg/dL — ABNORMAL HIGH (ref 65–99)

## 2015-01-22 LAB — BASIC METABOLIC PANEL
Anion gap: 11 (ref 5–15)
BUN: 14 mg/dL (ref 6–20)
CHLORIDE: 95 mmol/L — AB (ref 101–111)
CO2: 27 mmol/L (ref 22–32)
Calcium: 9 mg/dL (ref 8.9–10.3)
Creatinine, Ser: 0.76 mg/dL (ref 0.44–1.00)
GFR calc Af Amer: >60 mL/min (ref >60)
GFR calc non Af Amer: >60 mL/min (ref >60)
GLUCOSE: 193 mg/dL — AB (ref 65–99)
POTASSIUM: 3.5 mmol/L (ref 3.5–5.1)
Sodium: 133 mmol/L — ABNORMAL LOW (ref 135–145)

## 2015-01-22 LAB — CBC
HEMATOCRIT: 42.3 % (ref 36.0–46.0)
HEMOGLOBIN: 14 g/dL (ref 12.0–15.0)
MCH: 29.3 pg (ref 26.0–34.0)
MCHC: 33.1 g/dL (ref 30.0–36.0)
MCV: 88.5 fL (ref 78.0–100.0)
Platelets: 316 10*3/uL (ref 150–400)
RBC: 4.78 MIL/uL (ref 3.87–5.11)
RDW: 14.1 % (ref 11.5–15.5)
WBC: 18.6 10*3/uL — AB (ref 4.0–10.5)

## 2015-01-22 LAB — TSH: TSH: 0.678 u[IU]/mL (ref 0.350–4.500)

## 2015-01-22 LAB — PROTIME-INR
INR: 1.13 (ref 0.00–1.49)
Prothrombin Time: 14.7 seconds (ref 11.6–15.2)

## 2015-01-22 MED ORDER — HYDRALAZINE HCL 20 MG/ML IJ SOLN
10.0000 mg | Freq: Four times a day (QID) | INTRAMUSCULAR | Status: DC | PRN
  Administered 2015-01-22: 10 mg via INTRAVENOUS
  Filled 2015-01-22 (×2): qty 1

## 2015-01-22 MED ORDER — ONDANSETRON HCL 4 MG/2ML IJ SOLN
4.0000 mg | Freq: Four times a day (QID) | INTRAMUSCULAR | Status: DC | PRN
  Administered 2015-01-22: 4 mg via INTRAVENOUS
  Filled 2015-01-22: qty 2

## 2015-01-22 MED ORDER — METHYLPREDNISOLONE SODIUM SUCC 125 MG IJ SOLR
60.0000 mg | Freq: Two times a day (BID) | INTRAMUSCULAR | Status: AC
  Administered 2015-01-23 (×2): 60 mg via INTRAVENOUS
  Filled 2015-01-22 (×2): qty 2

## 2015-01-22 MED ORDER — MORPHINE SULFATE (PF) 2 MG/ML IV SOLN
1.0000 mg | Freq: Once | INTRAVENOUS | Status: AC
  Administered 2015-01-22: 1 mg via INTRAVENOUS
  Filled 2015-01-22: qty 1

## 2015-01-22 MED ORDER — WARFARIN SODIUM 7.5 MG PO TABS
7.5000 mg | ORAL_TABLET | Freq: Once | ORAL | Status: AC
Start: 2015-01-22 — End: 2015-01-22
  Administered 2015-01-22: 7.5 mg via ORAL
  Filled 2015-01-22: qty 1

## 2015-01-22 NOTE — Patient Outreach (Signed)
[facility]) Care Management  01/22/2015  [name] [name] April 29, 1940 [id number]   Referral from Charlesetta Shanks, RN, assigned Wyatt Haste, RN and Steve Rattler, PharmD.  Thanks, [name]. [name] [facility] Care Management [facility] CM Assistant Phone: [phone] Fax: [phone]

## 2015-01-22 NOTE — Progress Notes (Signed)
Nutrition Brief Note  Patient identified on the Malnutrition Screening Tool (MST) Report  Wt Readings from Last 15 Encounters:  01/22/15 200 lb 8 oz (90.946 kg)  11/18/14 200 lb (90.719 kg)  06/18/14 215 lb 12 oz (97.864 kg)  02/28/14 217 lb 9.6 oz (98.703 kg)  10/31/13 209 lb (94.802 kg)  09/11/13 159 lb (72.122 kg)  08/29/13 201 lb (91.173 kg)  08/21/13 201 lb 14.4 oz (91.581 kg)  07/22/13 209 lb (94.802 kg)  06/12/13 202 lb 12.8 oz (91.989 kg)  06/04/13 200 lb 12 oz (91.06 kg)  05/01/13 204 lb (92.534 kg)  04/10/13 202 lb (91.627 kg)  04/03/13 206 lb (93.441 kg)  01/29/13 201 lb (91.173 kg)    Body mass index is 32.38 kg/(m^2). Patient meets criteria for Obesity based on current BMI.   Current diet order is Heart Health/Carb Modified, patient is consuming approximately 50-75% of meals at this time. Pt reports making efforts to lose weight PTA. Her appetite has been decreased for the past 2 weeks; she feels her PO intake has varied but overall has been adequate. Pt's weight is stable and she appears well nourished. RD offered review of carb modified diet but, pt declined. Encouraged adequate healthful PO intake during hospitalization. Labs and medications reviewed.   No nutrition interventions warranted at this time. If nutrition issues arise, please consult RD.   Dorothea Ogle RD, LDN Inpatient Clinical Dietitian Pager: [phone] After Hours Pager: [phone]

## 2015-01-22 NOTE — Progress Notes (Signed)
PROGRESS NOTE  [name] [name] ZDG:[id number] DOB: 02-08-1941 DOA: 01/20/2015 PCP: Sanda Linger, MD   Brief history 74 year old female with a history of paroxysmal atrial fibrillation, hyperlipidemia, hypothyroidism, asthma, hypertension, diabetes mellitus presented with 1 week history of worsening shortness of breath and nonproductive cough. The patient is also complaining of lower extremity edema which she states has been intermittently worsening. She denied any fevers, chills, chest pain, nausea, vomiting. The patient was found to have significant wheezing and hypoxemic in the emergency department. She was started on supplemental oxygen and intravenous steroids. Notably, the patient has been out of her medications for the past 2 weeks.  Assessment/Plan: Acute respiratory failure with hypoxia -Secondary to asthma exacerbation -Continue intravenous IV Solu-Medrol--plan to start wean on 9/30 -Continue supplemental oxygen -Restart Symbicort -Presently stable on 2 L Asthma exacerbation -Continue intravenous steroids -Restart Symbicort -Continue aerosolized albuterol Paroxysmal atrial fibrillation-Campath induced -presently in sinus -CHADS-VASc= 5-6 -restarted coumadin diabetes mellitus type 2  -CBGs elevated probably due to steroids  -11/18/2014 hemoglobin A1c 5.6  -Previously on metformin the outpatient setting  -start novolog sliding scale Hypertension  -olmesartan recently stopped -continue lisinopril -continue metoprolol tartrate Hypothyroidism  -Check 0.678 -Restart Synthroid  Lower extremity Edema -venous duplex neg for DVT  Family Communication: Pt at beside Disposition Plan: Home1-2 days   Procedures/Studies: Dg Chest Portable 1 View  01/20/2015   CLINICAL DATA:  Shortness of breath with right-sided chest pain for 1 week  EXAM: PORTABLE CHEST 1 VIEW  COMPARISON:  Aug 25, 2013  FINDINGS: There is no edema or consolidation. The heart size  and pulmonary vascularity are normal. No pneumothorax. No adenopathy. No bone lesions.  IMPRESSION: No edema or consolidation.   Electronically Signed   By: Bretta Bang III M.D.   On: 01/20/2015 19:04         Subjective: Patient states that she is breathing much better. Still has some dyspnea on exertion. Denies any fevers, chills, chest pain, hemoptysis, nausea, vomiting, diarrhea, abdominal pain. No dysuria or hematuria. No rashes.  Objective: Filed Vitals:   01/22/15 1018 01/22/15 1100 01/22/15 1448 01/22/15 1627  BP: 132/85 146/74  130/60  Pulse: 79 73 70 70  Temp:  98.2 F (36.8 C)  99.2 F (37.3 C)  TempSrc:  Oral  Oral  Resp:  Height:      Weight:      SpO2: 94% 94% 95% 95%    Intake/Output Summary (Last 24 hours) at 01/22/15 1752 Last data filed at 01/22/15 1752  Gross per 24 hour  Intake    593 ml  Output      0 ml  Net    593 ml   Weight change: 0.227 kg (8 oz) Exam:   General:  Pt is alert, follows commands appropriately, not in acute distress  HEENT: No icterus, No thrush, No neck mass, [place]/AT  Cardiovascular: RRR, S1/S2, no rubs, no gallops  Respiratory: Bibasilar expiratory wheeze. Good air movement.  Abdomen: Soft/+BS, non tender, non distended, no guarding  Extremities: 1 +LE edema, No lymphangitis, No petechiae, No rashes, no synovitis  Data Reviewed: Basic Metabolic Panel:  Recent Labs Lab 01/20/15 1850 01/22/15 0305  NA 136 133*  K 3.8 3.5  CL 97* 95*  CO2 29 27  GLUCOSE 124* 193*  BUN 7 14  CREATININE 0.79 0.76  CALCIUM 9.0 9.0   Liver Function Tests: No results for input(s): AST, ALT, ALKPHOS, BILITOT,  PROT, ALBUMIN in the last 168 hours. No results for input(s): LIPASE, AMYLASE in the last 168 hours. No results for input(s): AMMONIA in the last 168 hours. CBC:  Recent Labs Lab 01/20/15 1850 01/22/15 0305  WBC 10.3 18.6*  HGB 14.0 14.0  HCT 43.2 42.3  MCV 90.0 88.5  PLT 322 316   Cardiac Enzymes: No  results for input(s): CKTOTAL, CKMB, CKMBINDEX, TROPONINI in the last 168 hours. BNP: Invalid input(s): POCBNP CBG:  Recent Labs Lab 01/21/15 1647 01/21/15 2059 01/22/15 0605 01/22/15 1122 01/22/15 1634  GLUCAP 166* 165* 159* 159* 152*    No results found for this or any previous visit (from the past 240 hour(s)).   Scheduled Meds: . budesonide-formoterol  2 puff Inhalation BID  . insulin aspart  0-9 Units Subcutaneous TID WC  . ipratropium-albuterol  3 mL Nebulization Q6H  . levothyroxine  50 mcg Oral QAC breakfast  . lisinopril  10 mg Oral Daily  . [START ON 01/23/2015] methylPREDNISolone (SOLU-MEDROL) injection  60 mg Intravenous Q12H  . methylPREDNISolone (SOLU-MEDROL) injection  80 mg Intravenous 3 times per day  . metoprolol tartrate  25 mg Oral BID  . sodium chloride  3 mL Intravenous Q12H  . sodium chloride  3 mL Intravenous Q12H  . Warfarin - Pharmacist Dosing Inpatient   Does not apply q1800   Continuous Infusions:    TAT, DAVID, DO  [facility] Pager [phone]  If 7PM-7AM, please contact night-coverage [email or web address] Password [email or web address] 01/22/2015, 5:52 PM   LOS: 2 days

## 2015-01-22 NOTE — Progress Notes (Signed)
UR completed 

## 2015-01-22 NOTE — Progress Notes (Signed)
  Echocardiogram 2D Echocardiogram has been performed.  GREGORY, ANGELA 01/22/2015, 9:28 AM

## 2015-01-22 NOTE — Progress Notes (Signed)
Patient has been in an anxious and or restless like state for about the past couple of hours but patient refused anything to help her. She naturally is has slight labored breathing as well. Blood pressure has been elevated and I feel the patient is beginning to have some anxiety over that. Patient stated she felt like she was going to have a bowel movement and therefore assisted patient to the bedside commode. No bowel movement. After getting back to bed, patient stated she was beginning to feel nauseated. Previously this occurred in the shift, but patient refused for nurse to call doctor to get something for nausea. She only wanted a cold towel placed on her forehead. This occurrence of nause with very small amount of vomiting,  encouraged patient to allow nurse to ask for something for nausea and anxiety to help her not feel so anxious or feel restless. Patient agreed. Notified hospitalist. Orders given for morphine and zofran. Both were given per order. Also administered breathing treatment which was scheduled and given at the same time as the other medications. After all were given, patient states she feels a little better and feels more relaxed. Will continue to monitor patient to end of shift.

## 2015-01-22 NOTE — Progress Notes (Signed)
ANTICOAGULATION CONSULT NOTE - Initial Consult  Pharmacy Consult for Warfarin Indication: atrial fibrillation    Patient Measurements: Height:  (167.6 cm) Weight: 200 lb 8 oz (90.946 kg) (scale c) IBW/kg (Calculated) : 59.3 Heparin Dosing Weight:   Vital Signs: Temp: 98 F (36.7 C) (09/29 0607) Temp Source: Oral (09/29 0607) BP: 131/101 mmHg (09/29 0607) Pulse Rate: 80 (09/29 0828)  Labs:  Recent Labs  01/20/15 1850 01/22/15 0305  HGB 14.0 14.0  HCT 43.2 42.3  PLT 322 316  LABPROT 15.0 14.7  INR 1.16 1.13  CREATININE 0.79 0.76    Estimated Creatinine Clearance: 67.9 mL/min (by C-G formula based on Cr of 0.76).   Medical History: Past Medical History  Diagnosis Date  . Migraine, unspecified, without mention of intractable migraine without mention of status migrainosus   . HLD (hyperlipidemia)   . Aortic insufficiency   . Colon polyps   . Hypothyroidism   . GERD (gastroesophageal reflux disease)   . Osteoarthritis   . HTN (hypertension)   . Depression   . PAF (paroxysmal atrial fibrillation)     on coumadin and Cardiazem  . Asthma   . Allergic rhinitis   . Chronic anticoagulation     on coumadin  . Obesity   . Diastolic dysfunction   . S/P cardiac cath 2003    no obstructive disease     Scheduled:  . budesonide-formoterol  2 puff Inhalation BID  . Influenza vac split quadrivalent PF  0.5 mL Intramuscular Tomorrow-1000  . insulin aspart  0-9 Units Subcutaneous TID WC  . ipratropium-albuterol  3 mL Nebulization Q6H  . levothyroxine  50 mcg Oral QAC breakfast  . lisinopril  10 mg Oral Daily  . methylPREDNISolone (SOLU-MEDROL) injection  80 mg Intravenous 3 times per day  . metoprolol tartrate  25 mg Oral BID  . sodium chloride  3 mL Intravenous Q12H  . sodium chloride  3 mL Intravenous Q12H  . Warfarin - Pharmacist Dosing Inpatient   Does not apply q1800    Assessment: 74yo female with history of PAF on warfarin, HLD, HTN, DM2 and asthma  presented with worsening SOB/asthma exacerbation. Pharmacy is consulted to dose warfarin for atrial fibrillation. INR 1.13, CBC wnl, sCr 0.7.  PTA Warfarin Dose:  Mon/Fri and 7.5mg  AODs Pt reports being out of medication for 10 days on admission.  Goal of Therapy:  INR 2-3 Monitor platelets by anticoagulation protocol: Yes   Plan:  Warfarin 7.5mg  tonight x1 Daily INR/CBC Monitor s/sx of bleeding  Pollyann Samples, PharmD, BCPS 01/22/2015, 9:35 AM Pager: [phone]

## 2015-01-22 NOTE — Progress Notes (Signed)
Notified on-call hospitalist of elevated blood pressure of 208/104. Asked doctor if patient needed more since the  of IV hydralazine that was given at 7p was not effective. Orders given for PRN 10 IV hydralazine. Will carry out order per PRN order and recheck blood pressure in after administering medication and continue monitoring patient to end of shift.

## 2015-01-22 NOTE — Progress Notes (Signed)
Came in to patients room to give medication and patient states she feels a lot better than last night. Complains of no headache at this time Will continue to monitor patient to end of shift.

## 2015-01-22 NOTE — Progress Notes (Signed)
VASCULAR LAB PRELIMINARY  PRELIMINARY  PRELIMINARY  PRELIMINARY  Bilateral lower extremity venous duplex  completed.    Preliminary report:  Bilateral:  No evidence of DVT, superficial thrombosis, or Baker's Cyst.    Cestone,Helene, RVT 01/22/2015, 3:26 PM

## 2015-01-23 ENCOUNTER — Other Ambulatory Visit: Payer: Self-pay | Admitting: Pharmacist

## 2015-01-23 DIAGNOSIS — I480 Paroxysmal atrial fibrillation: Secondary | ICD-10-CM

## 2015-01-23 LAB — GLUCOSE, CAPILLARY
GLUCOSE-CAPILLARY: 157 mg/dL — AB (ref 65–99)
GLUCOSE-CAPILLARY: 157 mg/dL — AB (ref 65–99)
GLUCOSE-CAPILLARY: 135 mg/dL — AB (ref 65–99)
GLUCOSE-CAPILLARY: 155 mg/dL — AB (ref 65–99)

## 2015-01-23 LAB — CBC
HCT: 38.6 % (ref 36.0–46.0)
Hemoglobin: 12.9 g/dL (ref 12.0–15.0)
MCH: 29.9 pg (ref 26.0–34.0)
MCHC: 33.4 g/dL (ref 30.0–36.0)
MCV: 89.4 fL (ref 78.0–100.0)
PLATELETS: 313 10*3/uL (ref 150–400)
RBC: 4.32 MIL/uL (ref 3.87–5.11)
RDW: 14.3 % (ref 11.5–15.5)
WBC: 15.9 10*3/uL — AB (ref 4.0–10.5)

## 2015-01-23 LAB — HEMOGLOBIN A1C
HEMOGLOBIN A1C: 6.2 % — AB (ref 4.8–5.6)
MEAN PLASMA GLUCOSE: 131 mg/dL

## 2015-01-23 LAB — BASIC METABOLIC PANEL
Anion gap: 8 (ref 5–15)
BUN: 21 mg/dL — AB (ref 6–20)
CHLORIDE: 96 mmol/L — AB (ref 101–111)
CO2: 29 mmol/L (ref 22–32)
Calcium: 8.7 mg/dL — ABNORMAL LOW (ref 8.9–10.3)
Creatinine, Ser: 0.86 mg/dL (ref 0.44–1.00)
GFR calc Af Amer: >60 mL/min (ref >60)
GFR calc non Af Amer: >60 mL/min (ref >60)
Glucose, Bld: 163 mg/dL — ABNORMAL HIGH (ref 65–99)
POTASSIUM: 4.2 mmol/L (ref 3.5–5.1)
SODIUM: 133 mmol/L — AB (ref 135–145)

## 2015-01-23 LAB — PROTIME-INR
INR: 1.51 — ABNORMAL HIGH (ref 0.00–1.49)
PROTHROMBIN TIME: 18.2 s — AB (ref 11.6–15.2)

## 2015-01-23 MED ORDER — WARFARIN SODIUM 7.5 MG PO TABS: 7.5000 mg | ORAL_TABLET | ORAL | Status: DC

## 2015-01-23 MED ORDER — PREDNISONE 50 MG PO TABS: 60.0000 mg | ORAL_TABLET | Freq: Every day | ORAL | Status: DC

## 2015-01-23 MED ORDER — WARFARIN SODIUM 5 MG PO TABS
5.0000 mg | ORAL_TABLET | ORAL | Status: DC
  Administered 2015-01-23: 5 mg via ORAL
  Filled 2015-01-23: qty 1

## 2015-01-23 NOTE — Progress Notes (Signed)
[name] [name] SW received call from patient's son wanting patient placed in a nursing facility.  CM talked to patient again about DCP; Patient stated that "if I go home, I will have to wait on my husband hand and feet and I cant do that. I want to go somewhere to rest and recover my energy." CM informed patient that she have to qualify to go to a nursing facility for therapy. Patient stated that she is independent at home, has a cane and walker at home but does not use them. PT/ OT evals ordered. CM also informed patient that if she still wanted to go to a nursing facility and does not qualify she would be private pay. Awaiting evals. Abelino Derrick [facility] [phone]

## 2015-01-23 NOTE — Care Management Important Message (Signed)
Important Message  Patient Details  Name: [name] [name] MRN: [id number] Date of Birth: 02-16-1941   Medicare Important Message Given:  Yes-second notification given    Orson Aloe 01/23/2015, 3:10 PM

## 2015-01-23 NOTE — Patient Outreach (Signed)
[facility]) Care Management  [facility]   01/23/2015  [name] [birth year] [id number]     [name] [name] is a 74 y.o. female who was referred to [facility] CM Pharmacy for medication assistance and medication management.   It is noted that patient is currently admitted to the hospital. Will defer further review to after discharge.  [name], PharmD, BCPS Clinical Pharmacist [facility] [phone]

## 2015-01-23 NOTE — Progress Notes (Signed)
ANTICOAGULATION CONSULT NOTE - Initial Consult  Pharmacy Consult for Warfarin Indication: atrial fibrillation    Patient Measurements: Height:  (167.6 cm) Weight: 200 lb 8 oz (90.946 kg) IBW/kg (Calculated) : 59.3  Vital Signs: Temp: 98.2 F (36.8 C) (09/30 0612) Temp Source: Oral (09/30 0612) BP: 146/74 mmHg (09/30 0612) Pulse Rate: 75 (09/30 0612)  Labs:  Recent Labs  01/20/15 1850 01/22/15 0305 01/23/15 0329  HGB 14.0 14.0 12.9  HCT 43.2 42.3 38.6  PLT 322 316 313  LABPROT 15.0 14.7 18.2*  INR 1.16 1.13 1.51*  CREATININE 0.79 0.76 0.86    Estimated Creatinine Clearance: 63.2 mL/min (by C-G formula based on Cr of 0.86).   Medical History: Past Medical History  Diagnosis Date  . Migraine, unspecified, without mention of intractable migraine without mention of status migrainosus   . HLD (hyperlipidemia)   . Aortic insufficiency   . Colon polyps   . Hypothyroidism   . GERD (gastroesophageal reflux disease)   . Osteoarthritis   . HTN (hypertension)   . Depression   . PAF (paroxysmal atrial fibrillation)     on coumadin and Cardiazem  . Asthma   . Allergic rhinitis   . Chronic anticoagulation     on coumadin  . Obesity   . Diastolic dysfunction   . S/P cardiac cath 2003    no obstructive disease     Scheduled:  . budesonide-formoterol  2 puff Inhalation BID  . insulin aspart  0-9 Units Subcutaneous TID WC  . ipratropium-albuterol  3 mL Nebulization Q6H  . levothyroxine  50 mcg Oral QAC breakfast  . lisinopril  10 mg Oral Daily  . methylPREDNISolone (SOLU-MEDROL) injection  60 mg Intravenous Q12H  . metoprolol tartrate  25 mg Oral BID  . sodium chloride  3 mL Intravenous Q12H  . sodium chloride  3 mL Intravenous Q12H  . Warfarin - Pharmacist Dosing Inpatient   Does not apply q1800    Assessment: 74yo female with history of PAF on warfarin, HLD, HTN, DM2 and asthma presented with worsening SOB/asthma exacerbation. Pharmacy is consulted to  dose warfarin for atrial fibrillation. INR 1.5, H/H slightly lower but stable, sCr 0.86.  PTA Warfarin Dose:  Mon/Fri and 7.5mg  AODs Pt reports being out of medication for 10 days on admission.  Goal of Therapy:  INR 2-3 Monitor platelets by anticoagulation protocol: Yes   Plan:  1. INR trending up appropriately. Will resume home dosing regimen of  Mon/Fri and 7.5mg  AODs for now.  2. If dc/d would continue current dosing regimen with close f/u.  3. Daily INR/CBC 4. Monitor s/sx of bleeding  Pollyann Samples, PharmD, BCPS 01/23/2015, 8:19 AM Pager: [phone]

## 2015-01-23 NOTE — Progress Notes (Signed)
PROGRESS NOTE  [name] [name] XBJ:[id number] DOB: May 06, 1940 DOA: 01/20/2015 PCP: Sanda Linger, MD   Brief history 74 year old female with a history of paroxysmal atrial fibrillation, hyperlipidemia, hypothyroidism, asthma, hypertension, diabetes mellitus presented with 1 week history of worsening shortness of breath and nonproductive cough. The patient is also complaining of lower extremity edema which she states has been intermittently worsening. She denied any fevers, chills, chest pain, nausea, vomiting. The patient was found to have significant wheezing and hypoxemic in the emergency department. She was started on supplemental oxygen and intravenous steroids. Notably, the patient has been out of her medications for the past 2 weeks.  Assessment/Plan: Acute respiratory failure with hypoxia -Secondary to asthma exacerbation -Continue intravenous IV Solu-Medrol--plan to start wean on 9/30 -Continue supplemental oxygen -Restart Symbicort -Presently stable on 2 L -check ambulatory pulse oximetry prior to discharge Asthma exacerbation -Continue intravenous steroids -Restart Symbicort -Continue aerosolized albuterol -Plan to wean to oral steroids 01/24/2015 Paroxysmal atrial fibrillation-Campath induced -presently in sinus -CHADS-VASc= 5-6 -restarted coumadin diabetes mellitus type 2  -CBGs elevated probably due to steroids  -11/18/2014 hemoglobin A1c 5.6  -Previously on metformin the outpatient setting  -start novolog sliding scale Hypertension  -olmesartan recently stopped -continue lisinopril -continue metoprolol tartrate Hypothyroidism  -Check 0.678 -Restart Synthroid  Lower extremity Edema -venous duplex neg for DVT  Family Communication: Pt at beside Disposition Plan: Home10/1 if stable   Procedures/Studies: Dg Chest Portable 1 View  01/20/2015   CLINICAL DATA:  Shortness of breath with right-sided chest pain for 1 week  EXAM: PORTABLE  CHEST 1 VIEW  COMPARISON:  Aug 25, 2013  FINDINGS: There is no edema or consolidation. The heart size and pulmonary vascularity are normal. No pneumothorax. No adenopathy. No bone lesions.  IMPRESSION: No edema or consolidation.   Electronically Signed   By: Bretta Bang III M.D.   On: 01/20/2015 19:04         Subjective:   Objective: Filed Vitals:   01/23/15 0612 01/23/15 0809 01/23/15 1238 01/23/15 1239  BP: 146/74  141/60   Pulse: 75  64   Temp: 98.2 F (36.8 C)  98.1 F (36.7 C)   TempSrc: Oral  Oral   Resp: 20  18   Height:      Weight: 90.946 kg (200 lb 8 oz)     SpO2: 91% 92% 88% 93%    Intake/Output Summary (Last 24 hours) at 01/23/15 1856 Last data filed at 01/23/15 1757  Gross per 24 hour  Intake   1380 ml  Output    350 ml  Net   1030 ml   Weight change: 0 kg (0 lb) Exam:   General:  Pt is alert, follows commands appropriately, not in acute distress  HEENT: No icterus, No thrush, No neck mass, [place]/AT  Cardiovascular: RRR, S1/S2, no rubs, no gallops  Respiratory: CTA bilaterally, no wheezing, no crackles, no rhonchi  Abdomen: Soft/+BS, non tender, non distended, no guarding  Extremities: No edema, No lymphangitis, No petechiae, No rashes, no synovitis  Data Reviewed: Basic Metabolic Panel:  Recent Labs Lab 01/20/15 1850 01/22/15 0305 01/23/15 0329  NA 136 133* 133*  K 3.8 3.5 4.2  CL 97* 95* 96*  CO2 GLUCOSE 124* 193* 163*  BUN 7 14 21*  CREATININE 0.79 0.76 0.86  CALCIUM 9.0 9.0 8.7*   Liver Function Tests: No results for input(s): AST, ALT, ALKPHOS, BILITOT, PROT, ALBUMIN in the  last 168 hours. No results for input(s): LIPASE, AMYLASE in the last 168 hours. No results for input(s): AMMONIA in the last 168 hours. CBC:  Recent Labs Lab 01/20/15 1850 01/22/15 0305 01/23/15 0329  WBC 10.3 18.6* 15.9*  HGB 14.0 14.0 12.9  HCT 43.2 42.3 38.6  MCV 90.0 88.5 89.4  PLT 322 316 313   Cardiac Enzymes: No results for  input(s): CKTOTAL, CKMB, CKMBINDEX, TROPONINI in the last 168 hours. BNP: Invalid input(s): POCBNP CBG:  Recent Labs Lab 01/22/15 1634 01/22/15 2207 01/23/15 0609 01/23/15 1125 01/23/15 1635  GLUCAP 152* 145* 157* 157* 135*    No results found for this or any previous visit (from the past 240 hour(s)).   Scheduled Meds: . budesonide-formoterol  2 puff Inhalation BID  . insulin aspart  0-9 Units Subcutaneous TID WC  . ipratropium-albuterol  3 mL Nebulization Q6H  . levothyroxine  50 mcg Oral QAC breakfast  . lisinopril  10 mg Oral Daily  . methylPREDNISolone (SOLU-MEDROL) injection  60 mg Intravenous Q12H  . metoprolol tartrate  25 mg Oral BID  . [START ON 01/24/2015] predniSONE  60 mg Oral Q breakfast  . sodium chloride  3 mL Intravenous Q12H  . sodium chloride  3 mL Intravenous Q12H  . warfarin  5 mg Oral Once per day on Mon Fri  . [START ON 01/24/2015] warfarin  7.5 mg Oral Once per day on Sun Tue Wed Thu Sat  . Warfarin - Pharmacist Dosing Inpatient   Does not apply q1800   Continuous Infusions:    TAT, DAVID, DO  [facility] Pager [phone]  If 7PM-7AM, please contact night-coverage [email or web address] Password [email or web address] 01/23/2015, 6:56 PM   LOS: 3 days

## 2015-01-24 LAB — GLUCOSE, CAPILLARY
GLUCOSE-CAPILLARY: 152 mg/dL — AB (ref 65–99)
Glucose-Capillary: 122 mg/dL — ABNORMAL HIGH (ref 65–99)

## 2015-01-24 LAB — PROTIME-INR
INR: 1.72 — AB (ref 0.00–1.49)
PROTHROMBIN TIME: 20.2 s — AB (ref 11.6–15.2)

## 2015-01-24 MED ORDER — LISINOPRIL 20 MG PO TABS: 20.0000 mg | ORAL_TABLET | Freq: Every day | ORAL | Status: DC

## 2015-01-24 MED ORDER — WARFARIN SODIUM 7.5 MG PO TABS
7.5000 mg | ORAL_TABLET | Freq: Every day | ORAL | Status: DC
Start: 2015-01-24 — End: 2015-02-10

## 2015-01-24 MED ORDER — WARFARIN SODIUM 5 MG PO TABS: 5.0000 mg | ORAL_TABLET | Freq: Every day | ORAL | Status: DC

## 2015-01-24 MED ORDER — METOPROLOL TARTRATE 25 MG PO TABS: 25.0000 mg | ORAL_TABLET | Freq: Two times a day (BID) | ORAL | Status: DC

## 2015-01-24 MED ORDER — PREDNISONE 20 MG PO TABS: 60.0000 mg | ORAL_TABLET | Freq: Every day | ORAL | Status: DC

## 2015-01-24 MED ORDER — PREDNISONE 50 MG PO TABS
60.0000 mg | ORAL_TABLET | Freq: Every day | ORAL | Status: DC
  Administered 2015-01-24: 60 mg via ORAL
  Filled 2015-01-24 (×2): qty 1

## 2015-01-24 MED ORDER — BUDESONIDE-FORMOTEROL FUMARATE 160-4.5 MCG/ACT IN AERO: 2.0000 | INHALATION_SPRAY | Freq: Two times a day (BID) | RESPIRATORY_TRACT | Status: DC

## 2015-01-24 NOTE — Progress Notes (Signed)
At 1503 all d/c instructions explained and given to pt and son.  Verbalized understanding.  D/c off floor via w/c to awaiting transport.  Greyson Peavy,RN.

## 2015-01-24 NOTE — Discharge Summary (Signed)
Physician Discharge Summary  [name] [name] XLK:[id number] DOB: July 12, 1940 DOA: 01/20/2015  PCP: Sanda Linger, MD  Admit date: 01/20/2015 Discharge date: 01/24/2015  Recommendations for Outpatient Follow-up:  1. Pt will need to follow up with PCP in 2 weeks post discharge 2. Please obtain CBC and INR on 01/27/15 and adjust coumadin accordingly  Discharge Diagnoses:  Acute respiratory failure with hypoxia -Secondary to asthma exacerbation -Continue intravenous IV Solu-Medrol--plan to start wean on 9/30 -Continue supplemental oxygen -Restart Symbicort -Presently stable on 2 L--weaned to RA -check ambulatory pulse oximetry prior to discharge--no desaturation Asthma exacerbation -Continue intravenous steroids -Restart Symbicort -Continue aerosolized albuterol -home with prednisone wean Paroxysmal atrial fibrillation-Campath induced -presently in sinus -CHADS-VASc= 5-6 -restarted coumadin--5 mg on Monday and Fridays, 7.5 mg on all other days diabetes mellitus type 2  -CBGs elevated due to steroids  -11/18/2014 hemoglobin A1c 5.6  -Previously on metformin the outpatient setting --restart after d/c -start novolog sliding scale while in hospital Hypertension  -olmesartan recently stopped -continue lisinopril 20 mg daily--although it was listed as allergy--rash, the patient received this for several days during the admission without difficulty -continue metoprolol tartrate 25 mg twice a day Hypothyroidism  -Check 0.678 -Restart Synthroid  Lower extremity Edema -venous duplex neg for DVT     Discharge Condition: stable  Disposition:  Follow-up Information    Follow up with Sanda Linger, MD.   Specialty:  Internal Medicine   Contact information:   [address] [place] [address] [phone]     home  Diet:heart healthy Wt Readings from Last 3 Encounters:  01/24/15 90.9 kg (200 lb 6.4 oz)  11/18/14 90.719 kg (200 lb)  06/18/14 97.864  kg (215 lb 12 oz)    History of present illness:  74 year old female with a history of paroxysmal atrial fibrillation, hyperlipidemia, hypothyroidism, asthma, hypertension, diabetes mellitus presented with 1 week history of worsening shortness of breath and nonproductive cough. The patient is also complaining of lower extremity edema which she states has been intermittently worsening. She denied any fevers, chills, chest pain, nausea, vomiting. The patient was found to have significant wheezing and hypoxemic in the emergency department. She was started on supplemental oxygen and intravenous steroids. Notably, the patient has been out of her medications for the past 2 weeks. The patient was continued on intravenous Solu-Medrol. She had good clinical response. The dose and frequency were gradually decreased. The patient was stable to be discharged home with prednisone taper. She was restarted on Symbicort. The patient was started on lisinopril and metoprolol tartrate. These medications were titrated to improve her blood pressure.    Discharge Exam: Filed Vitals:   01/24/15 0927  BP:   Pulse: 71  Temp:   Resp:    Filed Vitals:   01/24/15 0405 01/24/15 0723 01/24/15 0926 01/24/15 0927  BP: 171/67  160/58   Pulse: 68   71  Temp: 98.2 F (36.8 C)     TempSrc: Oral     Resp: 18     Height:      Weight: 90.9 kg (200 lb 6.4 oz)     SpO2: 94% 94%     General: A&O x 3, NAD, pleasant, cooperative Cardiovascular: RRR, no rub, no gallop, no S3 Respiratory: Bibasilar rales. No wheezing. Abdomen:soft, nontender, nondistended, positive bowel sounds Extremities: trace edema, No lymphangitis, no petechiae  Discharge Instructions      Discharge Instructions    AMB Referral to [facility] Care Management    Complete by:  As directed   Reason for consult:  Patient without meds for 2 weeks, affordability, asthma and blood pressure meds - community follow transition needs  Diagnoses of:  Diabetes    Expected date of contact:  1-3 days (reserved for hospital discharges)  Please assign to pharmacist (medication issues - affordability and management)  and community nurse for transition of care calls and assess for home visits. Questions please call:  Charlesetta Shanks, RN BSN CCM [facility] Liaison  [phone] business mobile phone  Charlesetta Shanks, RN BSN CCM [facility]  [phone] business mobile phone     Diet - low sodium heart healthy    Complete by:  As directed      Increase activity slowly    Complete by:  As directed             Medication List    TAKE these medications        albuterol 108 (90 BASE) MCG/ACT inhaler  Commonly known as:  PROAIR HFA  Inhale 2 puffs into the lungs every 6 (six) hours as needed for wheezing or shortness of breath.     budesonide-formoterol 160-4.5 MCG/ACT inhaler  Commonly known as:  SYMBICORT  Inhale 2 puffs into the lungs 2 (two) times daily.     esomeprazole 40 MG capsule  Commonly known as:  NEXIUM  Take 1 capsule (40 mg total) by mouth 2 (two) times daily before a meal.     fluticasone 50 MCG/ACT nasal spray  Commonly known as:  FLONASE  Place 2 sprays into both nostrils daily.     levothyroxine 50 MCG tablet  Commonly known as:  SYNTHROID  Take 1 tablet (50 mcg total) by mouth daily before breakfast.     lisinopril 20 MG tablet  Commonly known as:  PRINIVIL,ZESTRIL  Take 1 tablet (20 mg total) by mouth daily.     loratadine 10 MG tablet  Commonly known as:  CLARITIN  Take 10 mg by mouth daily.     metFORMIN 500 MG 24 hr tablet  Commonly known as:  GLUCOPHAGE-XR  TAKE 1 TABLET BY MOUTH ONCE DAILY WITH BREAKFAST     metoprolol tartrate 25 MG tablet  Commonly known as:  LOPRESSOR  Take 1 tablet (25 mg total) by mouth 2 (two) times daily.     montelukast 10 MG tablet  Commonly known as:  SINGULAIR  Take 1 tablet (10 mg total) by mouth at bedtime.     predniSONE 20 MG  tablet  Commonly known as:  DELTASONE  Take 3 tablets (60 mg total) by mouth daily with breakfast. X 2 days, then 2 tabs (40 mg) daily x 2 days, then 1 tab ( ) daily x 2 days     sodium chloride 0.65 % Soln nasal spray  Commonly known as:  OCEAN  Place 2 sprays into both nostrils 4 (four) times daily.     tolterodine 4 MG 24 hr capsule  Commonly known as:  DETROL LA  Take 1 capsule (4 mg total) by mouth daily.     warfarin 5 MG tablet  Commonly known as:  COUMADIN  Take as directed by anticoagulation clinic     warfarin 5 MG tablet  Commonly known as:  COUMADIN  Take 1 tablet (5 mg total) by mouth daily at 6 PM. On Monday and Friday     warfarin 7.5 MG tablet  Commonly known as:  COUMADIN  Take 1 tablet (7.5 mg total) by mouth  daily at 6 PM. On Sunday, Tuesday, Wednesday, Thursday, Saturday         The results of significant diagnostics from this hospitalization (including imaging, microbiology, ancillary and laboratory) are listed below for reference.    Significant Diagnostic Studies: Dg Chest Portable 1 View  01/20/2015   CLINICAL DATA:  Shortness of breath with right-sided chest pain for 1 week  EXAM: PORTABLE CHEST 1 VIEW  COMPARISON:  Aug 25, 2013  FINDINGS: There is no edema or consolidation. The heart size and pulmonary vascularity are normal. No pneumothorax. No adenopathy. No bone lesions.  IMPRESSION: No edema or consolidation.   Electronically Signed   By: Bretta Bang III M.D.   On: 01/20/2015 19:04     Microbiology: No results found for this or any previous visit (from the past 240 hour(s)).   Labs: Basic Metabolic Panel:  Recent Labs Lab 01/20/15 1850 01/22/15 0305 01/23/15 0329  NA 136 133* 133*  K 3.8 3.5 4.2  CL 97* 95* 96*  CO2 GLUCOSE 124* 193* 163*  BUN 7 14 21*  CREATININE 0.79 0.76 0.86  CALCIUM 9.0 9.0 8.7*   Liver Function Tests: No results for input(s): AST, ALT, ALKPHOS, BILITOT, PROT, ALBUMIN in the last 168  hours. No results for input(s): LIPASE, AMYLASE in the last 168 hours. No results for input(s): AMMONIA in the last 168 hours. CBC:  Recent Labs Lab 01/20/15 1850 01/22/15 0305 01/23/15 0329  WBC 10.3 18.6* 15.9*  HGB 14.0 14.0 12.9  HCT 43.2 42.3 38.6  MCV 90.0 88.5 89.4  PLT 322 316 313   Cardiac Enzymes: No results for input(s): CKTOTAL, CKMB, CKMBINDEX, TROPONINI in the last 168 hours. BNP: Invalid input(s): POCBNP CBG:  Recent Labs Lab 01/23/15 0609 01/23/15 1125 01/23/15 1635 01/23/15 2140 01/24/15 0600  GLUCAP 157* 157* 135* 155* 152*    Time coordinating discharge:  Greater than 30 minutes  Signed:  Emanuel Dowson, DO [facility] Pager: [phone] 01/24/2015, 11:01 AM

## 2015-01-24 NOTE — Evaluation (Signed)
Physical Therapy Evaluation and Discharge Patient Details Name: [name] [name] MRN: [id number] DOB: 14-Jun-1940 Today's Date: 01/24/2015   History of Present Illness  74 y.o. female admitted with Acute respiratory failure with hypoxia and asthma exacerbation.  Clinical Impression  Patient evaluated by Physical Therapy with no further acute PT needs identified. All education has been completed and the patient has no further questions. Ambulates generally well although slightly deconditioned. Recommended pt perform daily walking for exercise with daughter and to monitor for signs/symptoms of hypoxia. SpO2 maintained 93%-95% while ambulating on room air with minimal dyspnea.  See below for any follow-up Physial Therapy or equipment needs. PT is signing off. Thank you for this referral.     Follow Up Recommendations No PT follow up;Supervision - Intermittent    Equipment Recommendations  None recommended by PT    Recommendations for Other Services       Precautions / Restrictions Precautions Precautions: None Precaution Comments: monitor O2 Restrictions Weight Bearing Restrictions: No      Mobility  Bed Mobility               General bed mobility comments: sitting EOB  Transfers Overall transfer level: Modified independent Equipment used: Rolling walker (2 wheeled);None             General transfer comment: Standing when PT entered room. Did not require physical assist. Used RW initially for support without physical help  Ambulation/Gait Ambulation/Gait assistance: Supervision Ambulation Distance (Feet): 500 Feet Assistive device: Rolling walker (2 wheeled);None Gait Pattern/deviations: Step-through pattern;Decreased stride length;Wide base of support;Drifts right/left Gait velocity: decreased   General Gait Details: Mild sway noted at times when not using a rolling walker for support, more notable with turns but able to self correct. RW used for half of  distance covered and improves stability. Cues for upright posture, pursed lip breathing, and energy conservation techniques. SpO2 avg 93% on room air during ambulatory bout.  Stairs            Wheelchair Mobility    Modified Rankin (Stroke Patients Only)       Balance Overall balance assessment: Needs assistance Sitting-balance support: No upper extremity supported;Feet supported Sitting balance-Leahy Scale: Good     Standing balance support: No upper extremity supported Standing balance-Leahy Scale: Fair                               Pertinent Vitals/Pain Pain Assessment: No/denies pain    Home Living Family/patient expects to be discharged to:: Private residence Living Arrangements: Spouse/significant other;Children Available Help at Discharge: Family;Available 24 hours/day Type of Home: House Home Access: Stairs to enter Entrance Stairs-Rails: None Entrance Stairs-Number of Steps: 2 Home Layout: Two level;Able to live on main level with bedroom/bathroom Home Equipment: Walker - 4 wheels;Tub bench;Cane - single point      Prior Function Level of Independence: Independent               Hand Dominance   Dominant Hand: Right    Extremity/Trunk Assessment   Upper Extremity Assessment: Defer to OT evaluation           Lower Extremity Assessment: Generalized weakness         Communication   Communication: No difficulties  Cognition Arousal/Alertness: Awake/alert Behavior During Therapy: WFL for tasks assessed/performed Overall Cognitive Status: Within Functional Limits for tasks assessed  General Comments General comments (skin integrity, edema, etc.): SpO2 at rest on room air 93%, ambulating 93-95% on room air.. Mild dyspnea towards end of distance, cues for pursed lip breathing which increased SpO2.    Exercises        Assessment/Plan    PT Assessment Patent does not need any further PT  services  PT Diagnosis Abnormality of gait;Generalized weakness   PT Problem List    PT Treatment Interventions     PT Goals (Current goals can be found in the Care Plan section) Acute Rehab PT Goals Patient Stated Goal: Go home PT Goal Formulation: All assessment and education complete, DC therapy    Frequency     Barriers to discharge        Co-evaluation               End of Session   Activity Tolerance: Patient tolerated treatment well Patient left: in bed;with call bell/phone within reach Nurse Communication: Mobility status         Time: 1610-9604 PT Time Calculation (min) (ACUTE ONLY): 31 min   Charges:   PT Evaluation $Initial PT Evaluation Tier I: 1 Procedure PT Treatments $Gait Training: 8-22 mins   PT G CodesBerton Mount 01/24/2015, 11:59 AM  Charlsie Merles, PT [phone]

## 2015-01-26 ENCOUNTER — Other Ambulatory Visit: Payer: Self-pay | Admitting: *Deleted

## 2015-01-26 ENCOUNTER — Telehealth: Payer: Self-pay | Admitting: *Deleted

## 2015-01-26 NOTE — Telephone Encounter (Signed)
Called pt to set up TCM appt. Husband stated that she is currently out of town will be back on Wednesday. He will have her to return call...[name]

## 2015-01-26 NOTE — Patient Outreach (Signed)
[facility]) Care Management  01/26/2015  [name] [name] [birth year] [id number]   Assessment: Transition of care call- first attempt Referral received from hospital liaison [name] [name]) for transition of care calls and assess for home visits. Call placed to patient but she was not available. Spoke to a man who introduced himself as patient's husband ([name] [name]). Identity verified. [name] informed care management coordinator that patient is "out of town and won't be back til the end of the week". He states that patient is with son and daughter in [place].   Received staff message from hospital liaison [name] [name]) to contact patient's son [name] [name] [phone]) for post hospital follow-up. Call placed to son's phone number but no answer. HIPAA compliant voice message left with name and contact number.   Plan: Will await for return call. If unable to receive a call back. Will schedule patient for the next outreach call.  [name] [name], BSN, RN-BC [facility] Community Care Management Coordinator Cell: [phone]

## 2015-01-27 ENCOUNTER — Other Ambulatory Visit: Payer: Self-pay | Admitting: *Deleted

## 2015-01-27 ENCOUNTER — Other Ambulatory Visit: Payer: Self-pay

## 2015-01-27 NOTE — Patient Outreach (Addendum)
[facility]) Care Management  01/27/2015  [name] [name] 02/15/1941 [id number]   Assessment: Transition of care call Referral received from hospital liaison [name] [name]) for transition of care calls and assess for home visits.  Per staff message, instruction to call son ([name] [name]) for post hospital follow-up. Call placed to patient and son and was able to speak to patient's son [name] [name]- primary emergency contact).  Identity verified. Patient's son informs care management coordinator that patient is taking a nap and he is available to answer questions on behalf of his mother. Care management coordinator introduced self and explained the purpose of the call. Son confirms being aware of the referral. Patient's son relates to care management coordinator that patient's first husband died and patient remarried to 05/18/22 [name] [name] (husband). 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 Per son's report, patient had been so "physically worn out" being the caregiver of her husband that she did not have time to care of herself to the extent of letting some of her medicines ran out. Son states "I don't know if she did not have the energy to get up to pick up her medications or got so tired and had trouble breathing that she did not have enough oxygen supply to her brain and was not clearly thinking enough to call me for help, so I can pick up her medications". Patient's son further reports "when I went to their house and visit, I've seen she had cyanotic lips, then I decided to bring her to the hospital and she got admitted".  After hospital discharge, son brought patient to their [place] in [place] to recover and breath fresher air since patient's residence was not conducive for recovery and son describes it as "smelling like ammonia ". Son also reports that smell is combination of fecal matter and urine due to husband's  incontinence which patient had been ending up cleaning after husband's mess before.     Per son, patient is independent with her activities of daily living but dependent on her husband with paying bills, writing checks and managing their finances. Patient is able to ambulate around but easily gets fatigued. Son reports, patient has not use oxygen at home. He states he particularly asked that question when he was at the hospital but was told by staff that no oxygen use needed. Patient's son reports that patient uses Albuterol and Symbicort inhalers, using spacer and has a nebulizing machine. Medications reviewed with patient's son. Currently, son is managing patient's medications to make sure patient is taking all her medications as directed. Unlike when patient was managing her own medications, son wasn't sure if she was even taking all medications as ordered by her doctor. Son verbalized having no medical background, he asked his nurse practitioner friend in [place] who helped him manage and organize patient's medications. [facility] pharmacy referral is already in place for affordability and management of medications and son was made aware of it. According to son, patient is maintained on low sodium, low fat and low carbohydrate diet.  Patient has a scheduled appointment with Dr. Alwyn [name] on 10/11 and appointment with primary care provider (Dr. Leitha [name]) on 11/28. Son states he will call primary care provider's office to check if an earlier appointment with primary care provider is needed and be made for post hospital follow-up of patient. Son will provide transportation for patient as stated.  Son informed care management coordinator  that he lives in [place] full time and that patient should be brought back to her primary residence by this Saturday 10/8. He agreed to home visit next week. [facility] care management and  24-hour nurse line contact informations provided to son and encouraged to call if  needed.    Plan: Initial home visit on 02/06/15  [name] [name], BSN, RN-BC [facility] Community Care Management Coordinator Cell: [phone]

## 2015-01-28 NOTE — Telephone Encounter (Signed)
Pt return call back completed TCM call below.../lmb Transition Care Management Follow-up Telephone Call   Date discharged? 01/24/15   How have you been since you were released from the hospital? Pt states she is doing a lot better   Do you understand why you were in the hospital? YES   Do you understand the discharge instructions? YES   Where were you discharged to? Home   Items Reviewed:  Medications reviewed: YES  Allergies reviewed: YES  Dietary changes reviewed: YES Referrals reviewed: {No referral needed  Functional Questionnaire:   Activities of Daily Living (ADLs):   She states she are independent in the following: ambulation, bathing and hygiene, feeding, continence, grooming, toileting and dressing States she doesn't require assistance    Any transportation issues/concerns?: NO   Any patient concerns? NO   Confirmed importance and date/time of follow-up visits scheduled YES, MADE APPT FOR 02/03/15  Provider Appointment booked with DR. HOPPER  Confirmed with patient if condition begins to worsen call PCP or go to the ER.  Patient was given the office number and encouraged to call back with question or concerns.  : YES

## 2015-01-30 ENCOUNTER — Other Ambulatory Visit: Payer: Self-pay | Admitting: Pharmacist

## 2015-01-30 NOTE — Patient Outreach (Signed)
[facility]) Care Management  [facility]   01/30/2015  [name] [name] 1941-01-02 [id number]  Subjective: [name] [name] is a 74 y.o. female who was referred to [facility] for medication assistance and medication management.   Per report, patient's medications have been managed by her son since her discharge from the hospital.  Patient has AARP Medicare Part D plan. [facility].  I spoke to the patient's son [name] - consented) and he reported that his mother is doing better now that he has been assisting her with her medications. He reports that she is having a hard time affording her medications but that it is also a transportation issue in that she is too tired to go to the pharmacy to get her medications.   He reports that he is going to be more involved in her care moving forward and will help her with managing her medications and will be going to her doctor's appointments.   He thinks that the patient would benefit from mail order pharmacy so that she can save money and the medications will all be sent to her home.   Objective:   Current Medications: Current Outpatient Prescriptions  Medication Sig Dispense Refill  . albuterol (PROAIR HFA) 108 (90 BASE) MCG/ACT inhaler Inhale 2 puffs into the lungs every 6 (six) hours as needed for wheezing or shortness of breath. 8.5 each 11  . budesonide-formoterol (SYMBICORT) 160-4.5 MCG/ACT inhaler Inhale 2 puffs into the lungs 2 (two) times daily. 1 Inhaler 1  . esomeprazole (NEXIUM) 40 MG capsule Take 1 capsule (40 mg total) by mouth 2 (two) times daily before a meal. 180 capsule 3  . fluticasone (FLONASE) 50 MCG/ACT nasal spray Place 2 sprays into both nostrils daily. 48 g 3  . levothyroxine (SYNTHROID) 50 MCG tablet Take 1 tablet (50 mcg total) by mouth daily before breakfast. 90 tablet 1  . lisinopril (PRINIVIL,ZESTRIL) 20 MG tablet Take 1  tablet (20 mg total) by mouth daily. 30 tablet 1  . loratadine (CLARITIN) 10 MG tablet Take 10 mg by mouth daily.    . metFORMIN (GLUCOPHAGE-XR) 500 MG 24 hr tablet TAKE 1 TABLET BY MOUTH ONCE DAILY WITH BREAKFAST (Patient not taking: Reported on 01/20/2015) 90 tablet 1  . metoprolol tartrate (LOPRESSOR) 25 MG tablet Take 1 tablet (25 mg total) by mouth 2 (two) times daily. 60 tablet 1  . montelukast (SINGULAIR) 10 MG tablet Take 1 tablet (10 mg total) by mouth at bedtime. 90 tablet 3  . predniSONE (DELTASONE) 20 MG tablet Take 3 tablets (60 mg total) by mouth daily with breakfast. X 2 days, then 2 tabs (40 mg) daily x 2 days, then 1 tab ( ) daily x 2 days 12 tablet 0  . sodium chloride (OCEAN) 0.65 % SOLN nasal spray Place 2 sprays into both nostrils 4 (four) times daily. (Patient taking differently: Place 2 sprays into both nostrils daily. ) 90 mL 0  . tolterodine (DETROL LA) 4 MG 24 hr capsule Take 1 capsule (4 mg total) by mouth daily. 90 capsule 3  . warfarin (COUMADIN) 5 MG tablet Take as directed by anticoagulation clinic (Patient not taking: Reported on 01/27/2015) 40 tablet 3  . warfarin (COUMADIN) 5 MG tablet Take 1 tablet (5 mg total) by mouth daily at 6 PM. On Monday and Friday 10 tablet 0  . warfarin (COUMADIN) 7.5 MG tablet Take 1 tablet (7.5 mg total) by mouth daily at  6 PM. On Sunday, Tuesday, Wednesday, Thursday, Saturday 15 tablet 0   No current facility-administered medications for this visit.    Functional Status: In your present state of health, do you have any difficulty performing the following activities: 01/21/2015  Hearing? N  Vision? N  Difficulty concentrating or making decisions? Y  Walking or climbing stairs? Y  Dressing or bathing? N  Doing errands, shopping? Y    Fall/Depression Screening: PHQ 2/9 Scores 04/12/2013  PHQ - 2 Score 0    Assessment: 1. Medication assistance: patient has difficulty affording medications. She currently has AARP with [facility] so can use mail order to get generic medications for a $0 copay. This would also help with adherence as the medications would be delivered to her home.  2. Medication management: son is currently assisting her with her medications and does not believe that he needs help at this time.   Plan: 1. Medication assistance: Son is going to go with patient to doctors appt on 02/03/15 and request that medications be sent to mail order. I will follow up on 02/13/15 to ensure that mail order has been set up and that no other issues need to be addressed. Patient's son verbalized understanding.  2. Medication management: Son will continue to manage medications for patient and will let me know if any other issues arise in which I can assist.    [name], PharmD, [facility] Clinical Pharmacist [facility] [phone]

## 2015-02-03 ENCOUNTER — Other Ambulatory Visit: Payer: Self-pay | Admitting: Internal Medicine

## 2015-02-03 ENCOUNTER — Other Ambulatory Visit (INDEPENDENT_AMBULATORY_CARE_PROVIDER_SITE_OTHER): Payer: Medicare Other

## 2015-02-03 ENCOUNTER — Ambulatory Visit (INDEPENDENT_AMBULATORY_CARE_PROVIDER_SITE_OTHER): Payer: Medicare Other | Admitting: Internal Medicine

## 2015-02-03 ENCOUNTER — Encounter: Payer: Self-pay | Admitting: Internal Medicine

## 2015-02-03 VITALS — BP 140/80 | HR 78 | Temp 98.5°F | Wt 212.0 lb

## 2015-02-03 DIAGNOSIS — I480 Paroxysmal atrial fibrillation: Secondary | ICD-10-CM

## 2015-02-03 DIAGNOSIS — I10 Essential (primary) hypertension: Secondary | ICD-10-CM

## 2015-02-03 DIAGNOSIS — J45901 Unspecified asthma with (acute) exacerbation: Secondary | ICD-10-CM

## 2015-02-03 DIAGNOSIS — R710 Precipitous drop in hematocrit: Secondary | ICD-10-CM

## 2015-02-03 DIAGNOSIS — J9601 Acute respiratory failure with hypoxia: Secondary | ICD-10-CM

## 2015-02-03 DIAGNOSIS — E038 Other specified hypothyroidism: Secondary | ICD-10-CM

## 2015-02-03 DIAGNOSIS — R609 Edema, unspecified: Secondary | ICD-10-CM

## 2015-02-03 LAB — CBC WITH DIFFERENTIAL/PLATELET
BASOS PCT: 0.4 % (ref 0.0–3.0)
Basophils Absolute: 0.1 10*3/uL (ref 0.0–0.1)
EOS PCT: 0 % (ref 0.0–5.0)
Eosinophils Absolute: 0 10*3/uL (ref 0.0–0.7)
HEMATOCRIT: 38.1 % (ref 36.0–46.0)
HEMOGLOBIN: 12.8 g/dL (ref 12.0–15.0)
LYMPHS PCT: 5.9 % — AB (ref 12.0–46.0)
Lymphs Abs: 1.6 10*3/uL (ref 0.7–4.0)
MCHC: 33.5 g/dL (ref 30.0–36.0)
MCV: 87.7 fl (ref 78.0–100.0)
MONOS PCT: 2.7 % — AB (ref 3.0–12.0)
Monocytes Absolute: 0.7 10*3/uL (ref 0.1–1.0)
Neutro Abs: 24.3 10*3/uL — ABNORMAL HIGH (ref 1.4–7.7)
Neutrophils Relative %: 91 % — ABNORMAL HIGH (ref 43.0–77.0)
Platelets: 282 10*3/uL (ref 150.0–400.0)
RBC: 4.35 Mil/uL (ref 3.87–5.11)
RDW: 15.1 % (ref 11.5–15.5)

## 2015-02-03 LAB — PROTIME-INR
INR: 2.3 ratio — AB (ref 0.8–1.0)
PROTHROMBIN TIME: 24.7 s — AB (ref 9.6–13.1)

## 2015-02-03 MED ORDER — AZITHROMYCIN 250 MG PO TABS: ORAL_TABLET | ORAL | Status: DC

## 2015-02-03 NOTE — Progress Notes (Signed)
Pre visit review using our clinic review tool, if applicable. No additional management support is needed unless otherwise documented below in the visit note. 

## 2015-02-03 NOTE — Assessment & Plan Note (Signed)
She is on 5 mg Monday and Friday and 7.5 all other days. PT/INR will be drawn

## 2015-02-03 NOTE — Patient Instructions (Addendum)
  Your next office appointment will be determined based upon review of your pending labs.  Those written interpretation of the lab results and instructions will be transmitted to you by mail for your records.  Critical results will be called.   Followup as needed for any active or acute issue. Please report any significant change in your symptoms.  Please note the change in thyroid dose to 1 daily except one half on Tuesday and one half on Thursday. This is to decrease risk of paroxysmal atrial fibrillation.  Note: WBC results calledto her 10/11 & Zpack Rxed because of discolored sputum by hx.

## 2015-02-03 NOTE — Progress Notes (Signed)
   Subjective:    Patient ID: [name] [name], female    DOB: 02-13-1941, 74 y.o.   MRN: [id number]  HPI She was hospitalized 9/27-10/1/16 with acute respiratory failure associated with hypoxia. This was in  the context of asthma exacerbation off her asthma medications due to financial issues. In the hospital she received IV Solu-Medrol and was discharged on Symbicort. She was on supplemental oxygen initially but weaned to room air. At the time of discharge she was placed on a prednisone weaning protocol.  Because of paroxysmal atrial fibrillation coumadin was restarted. This also had been discontinued by her prior to her admission 9/27.  She had elevated glucoses in the hospital due to the steroids. Sliding scale NovoLog was administered in the hospital. Prior A1c on record was 5.6% on 11/18/14.It was 6.2% 9/29. Metformin was to be reinitiated following discharge.  Despite a history of lisinopril allergy this was continued in the hospital without adverse effects.  CBC and INR were requested 01/27/15;this was not done. Review of Systems Her edema improved in the hospital but has recurred over the last 24-36 hours. Her renal function was normal in the hospital. The last total protein, albumin, and liver function test were in February of this year.  Her hemoglobin dropped prior to discharge but she was not anemic  She does describe fatigue since being home. Her TSH was 0.678 on 9/29; her Synthroid was restarted.    Objective:   Physical Exam  Pertinent or positive findings include: There is exaggerated curvature of the thoracic spine. Breath sounds are decreased; she has isolated pops/wheezes. Heart sounds are distant as well. She has a grade 1 systolic murmur. She has 1+ pitting edema of the feet and ankles. Pedal pulses are decreased.Homan's sign is negative. She has varus deformities of the knees.  General appearance :adequately nourished; in no distress.  Eyes: No conjunctival  inflammation or scleral icterus is present.  Oral exam:  Lips and gums are healthy appearing.There is no oropharyngeal erythema or exudate noted. Dental hygiene is good.  Heart:  Normal rate and regular rhythm. S1 and S2 normal without gallop, click, rub or other extra sounds    Lungs:No increased work of breathing.   Abdomen: bowel sounds normal, soft and non-tender without masses, organomegaly or hernias noted.  No guarding or rebound.  Vascular : all pulses equal ; no bruits present.  Skin:Warm & dry.  Intact without suspicious lesions or rashes ; no tenting or jaundice   Lymphatic: No lymphadenopathy is noted about the head, neck, axilla.   Neuro: Strength, tone decreased.     Assessment & Plan:  #1 See Current Assessment & Plan in Problem List under specific Diagnosis  #2 edema, acute  See orders and recommendations

## 2015-02-04 ENCOUNTER — Telehealth: Payer: Self-pay | Admitting: Internal Medicine

## 2015-02-04 LAB — HEPATIC FUNCTION PANEL
ALT: 21 U/L (ref 0–35)
AST: 15 U/L (ref 0–37)
Albumin: 3.4 g/dL — ABNORMAL LOW (ref 3.5–5.2)
Alkaline Phosphatase: 56 U/L (ref 39–117)
Bilirubin, Direct: 0.1 mg/dL (ref 0.0–0.3)
TOTAL PROTEIN: 6.6 g/dL (ref 6.0–8.3)
Total Bilirubin: 0.9 mg/dL (ref 0.2–1.2)

## 2015-02-04 NOTE — Assessment & Plan Note (Signed)
L thyroxine dose decreased

## 2015-02-04 NOTE — Telephone Encounter (Signed)
I tried pt home and cell number. States number has been disconnected.  Will try again

## 2015-02-04 NOTE — Assessment & Plan Note (Signed)
O2 sats 93% on RA

## 2015-02-04 NOTE — Assessment & Plan Note (Signed)
Fair to good control @ present  Med compliance with Symbicort  stressed

## 2015-02-04 NOTE — Telephone Encounter (Signed)
Pt request to speak to the assistant concern about medication that Dr. Alwyn RenHopper suggest for her to speak to her PCP (only want to speak to the assistant). Please call her back, she tired couple times already today

## 2015-02-06 ENCOUNTER — Other Ambulatory Visit: Payer: Self-pay | Admitting: *Deleted

## 2015-02-06 ENCOUNTER — Encounter: Payer: Self-pay | Admitting: *Deleted

## 2015-02-06 NOTE — Patient Outreach (Signed)
[facility]) Care Management   02/06/2015  [name] [name] [birth year] [id number]  [name] [name] is an 74 y.o. female  Subjective: Patient states "doing good" after hospitalization. She reports "breathing ok". Currently on Z-pack because of "sinus congestion" as stated. "Just taking a 20 minute nap per day" as rest period.  Objective:  BP 154/98 mmHg  Pulse 60  Resp 16  LMP 12/01/2010  Review of Systems  Constitutional: Negative.   HENT: Positive for congestion.        Sinus congestion - currently on Z- pack as reported  Eyes: Negative.        Wears eyeglasses  Respiratory: Negative for cough and wheezing.        Clear to auscultation, no noted cough, no wheezing and no shortness of breath at rest.   Cardiovascular: Positive for leg swelling.       Heart sounds diminished/ soft 1-2 + pitting edema to lower extremities especially to bilateral ankles.   Gastrointestinal: Negative.        Bowel sounds present Abdomen soft, obese  Genitourinary:       Urinary incontinence - wears depends; taking Detrol  Musculoskeletal:       Reports one fall in 2015  Skin: Negative.        Right knee old surgical scar  Neurological: Negative.   Endo/Heme/Allergies: Negative.   Psychiatric/Behavioral: Positive for memory loss.       Slight difficulty remembering at times.    Physical Exam  Current Medications:   Current Outpatient Prescriptions  Medication Sig Dispense Refill  . albuterol (PROAIR HFA) 108 (90 BASE) MCG/ACT inhaler Inhale 2 puffs into the lungs every 6 (six) hours as needed for wheezing or shortness of breath. 8.5 each 11  . azithromycin (ZITHROMAX Z-PAK) 250 MG tablet 2 day 1, then 1 qd 6 tablet 0  . budesonide-formoterol (SYMBICORT) 160-4.5 MCG/ACT inhaler Inhale 2 puffs into the lungs 2 (two) times daily. 1 Inhaler 1  . esomeprazole (NEXIUM) 40 MG capsule Take 1 capsule (40 mg total) by mouth 2 (two) times daily before a meal. 180  capsule 3  . fluticasone (FLONASE) 50 MCG/ACT nasal spray Place 2 sprays into both nostrils daily. 48 g 3  . levothyroxine (SYNTHROID) 50 MCG tablet 1 before breakfast EXCEPT 1/2 on Tues & Thurs 90 tablet 1  . lisinopril (PRINIVIL,ZESTRIL) 20 MG tablet Take 1 tablet (20 mg total) by mouth daily. 30 tablet 1  . loratadine (CLARITIN) 10 MG tablet Take 10 mg by mouth daily.    . metoprolol tartrate (LOPRESSOR) 25 MG tablet Take 1 tablet (25 mg total) by mouth 2 (two) times daily. 60 tablet 1  . montelukast (SINGULAIR) 10 MG tablet Take 1 tablet (10 mg total) by mouth at bedtime. 90 tablet 3  . sodium chloride (OCEAN) 0.65 % SOLN nasal spray Place 2 sprays into both nostrils 4 (four) times daily. (Patient taking differently: Place 2 sprays into both nostrils daily. ) 90 mL 0  . tolterodine (DETROL LA) 4 MG 24 hr capsule Take 1 capsule (4 mg total) by mouth daily. 90 capsule 3  . warfarin (COUMADIN) 5 MG tablet Take 1 tablet (5 mg total) by mouth daily at 6 PM. On Monday and Friday 10 tablet 0  . warfarin (COUMADIN) 7.5 MG tablet Take 1 tablet (7.5 mg total) by mouth daily at 6 PM. On Sunday, Tuesday, Wednesday, Thursday, Saturday 15 tablet 0  . predniSONE (DELTASONE) 20 MG tablet Take 3 tablets (  60 mg total) by mouth daily with breakfast. X 2 days, then 2 tabs (40 mg) daily x 2 days, then 1 tab ( ) daily x 2 days (Patient not taking: Reported on 02/06/2015) 12 tablet 0  . warfarin (COUMADIN) 5 MG tablet Take as directed by anticoagulation clinic (Patient not taking: Reported on 02/06/2015) 40 tablet 3   No current facility-administered medications for this visit.    Functional Status:   In your present state of health, do you have any difficulty performing the following activities: 02/06/2015 01/21/2015  Hearing? - N  Vision? - N  Difficulty concentrating or making decisions? - Y  Walking or climbing stairs? - Y  Dressing or bathing? - N  Doing errands, shopping? - Y  Preparing Food and eating  ? Y -  Using the Toilet? N -  In the past six months, have you accidently leaked urine? Y -  Do you have problems with loss of bowel control? N -  Managing your Medications? Y -  Managing your Finances? Y -  Housekeeping or managing your Housekeeping? Y -    Fall/Depression Screening:    PHQ 2/9 Scores 02/06/2015 04/12/2013  PHQ - 2 Score 3 0  PHQ- 9 Score 7 -    Assessment:  74 year old female, retired Engineer, civil (consulting), who was recently hospitalized due to asthma exacerbation. Arrived at patient's house. Patient, husband (Jan) and son Ramon Dredge) were present during this visit.  Patient is living with her 32 year old husband with dementia and ending up providing care for him that causes her to be "physically worn out" as reported by son. Patient's son states no plans of placement for patient at this time. Son reports that patient and husband are living on their social security checks only.  Currently, son is cooking and preparing meals for patient, providing transportation to her appointments; helping finance her medications and manage patient's medications to make sure patient is taking all her medications as directed and not letting run out of any (had run out of Symbicort which may possibly caused her asthma flare-up).  [facility] pharmacy currently working with patient to obtain medications through mail delivery.  Patient had her primary care provider follow-up post discharge on 10/11 with pending laboratory results, Thyroid dose adjustment and new order for Z-pack for discolored sputum per doctor's note. Patient also had her Flu shot as reported. Next follow- up appointment with primary care provider is on 11/28.  Patient reports breathing better and has not needed oxygen at home. She reports that she does not have to use Albuterol inhaler daily anymore. She states "have not used it for the past two days",but the rest of medications she takes regularly as directed with son's assistance. According to  patient, she had been tested allergic to "dust mites". Patient reports that a housekeeper is paid to come clean their house once a week since she is not able to. Care management coordinator also provided them list of private-pay home aides/caregivers which they were interested with, as their option to avoid patient doing the household chores and taking care of husband (incontinent) as well. Patient states "will look into it".  Patient reports being able to walk around the house without using her walker. She states getting tired and short of breath with exertion. Encouraged patient to take rest periods at regular intervals and in between activities to avoid asthma flare- up and to elevate bilateral legs to help reduce swelling. Encourage patient to make sure that she has her  quick relief medication (Albuterol) at all times, in case she needs it and to take her preventive (long-term) asthma medications as ordered.  Patient was provided "Breathing Free" asthma educational booklet and discussed with patient and family. Encourage to ask questions if any.  Patient agreed to transition of care call next week.  Patient encourage to call [facility], care management coordinator or 24- hour nurse line as needed. Contact informations provided.    Plan: Transition of care call 10/21.   [facility] CM Care Plan Problem One        Most Recent Value   Care Plan Problem One  at risk for readmission   Role Documenting the Problem One  Care Management Coordinator   Care Plan for Problem One  Active   [facility] Long Term Goal (31-90 days)  patient will not be readmitted in the next 31 days   [facility] Long Term Goal Start Date  02/06/15   Interventions for Problem One Long Term Goal  encourage patient to adhere to medications especially asthma medications (rescue and preventive) and not let it run out,  provided  and discussed [facility] asthma pamphlet with patient,husband and son,  encourage patient to evaluate self daily and watch out for any  asthma flare up like coughing,shortness of breath,chest tightness and wheezing,  discuss importance and benefits of rest periods and encourage to take rest periods in between activities,  encourage to attend follow-up doctors' appointments,  emphasize to patient avoiding irrritants that can cause a flare up    [facility] CM Short Term Goal #1 (0-30 days)  patient will comply with asthma medications in the next 30 days    [facility] CM Short Term Goal #1 Start Date  02/06/15   Interventions for Short Term Goal #1  confirm application for mail delivery home medications,  discuss benefits and importance of taking medications as ordered especialy asthma medications,   explained importance of ordering medications when it is low to prevent running out,  encourage to follow system that son made ( medications numbered on spread sheet corresponding to numbers on each bottle or container))   [facility] CM Short Term Goal #2 (0-30 days)  patient will identify the different zones of asthma and when to call the doctor for help in the next 30 days   [facility] CM Short Term Goal #2 Start Date  02/06/15   Interventions for Short Term Goal #2  discuss the 3 zones of asthma and the signs and symptoms in each zone,  provide [facility] asthma educational booklet and explain the different colors representing each zone and when to call the doctor for help like using rescue inhaler more than every 4 hours or using it every day, wheezing that does not get better with quick-relief medication,persistent coughing, shortness of breath etc,  evaluate self daily using the 3 different zones    [facility] CM Short Term Goal #3 (0-30 days)  patient will verbalize at least 3 signs and symptoms of asthma exacerbation in the next 30 days    [facility] CM Short Term Goal #3 Start Date  02/06/15   Interventions for Short Tern Goal #3  provide [facility] asthma educational booklet and discuss,  explain what to look for as signs and symptoms of asthma flare-up,  encourage teach back,  ask patient  what she feels and how asthma flare-up is manifested by her body        Karin Golden A. Aaira Oestreicher, BSN, RN-BC [facility] Community Care Management Coordinator Cell: [phone]

## 2015-02-07 ENCOUNTER — Encounter: Payer: Self-pay | Admitting: *Deleted

## 2015-02-10 ENCOUNTER — Other Ambulatory Visit: Payer: Self-pay | Admitting: General Practice

## 2015-02-10 MED ORDER — WARFARIN SODIUM 5 MG PO TABS: ORAL_TABLET | ORAL | Status: DC

## 2015-02-10 NOTE — Telephone Encounter (Signed)
2nd attempt to reach pt both numbers have been disconnected

## 2015-02-13 ENCOUNTER — Other Ambulatory Visit: Payer: Self-pay | Admitting: Pharmacist

## 2015-02-13 ENCOUNTER — Other Ambulatory Visit: Payer: Self-pay | Admitting: *Deleted

## 2015-02-13 NOTE — Patient Outreach (Signed)
[facility]) Care Management  02/13/2015  [name] PolingMargaret M [name] March 23, 1941 [id number]   Assessment: Transition of care call Received inbound call and message from patient's son. Attempted to call patient again and was able to reach to her. She admits not getting up early and missed scheduled appointment earlier.  Patient reports "feeling better". Z-pack antibiotic completed.  She reports attending "[employer] ladies' luncheon" yesterday and "enjoyed socializing with everyone" per patient. She states "breathing ok but still gets tired easily". Stressed rest periods at intervals to be taken by her.   According to patient, she haven't had any problems with her asthma since discharge. Reports using Albuterol one time yesterday and was relieved. She was able to identify herself on the green zone. She verbalized, "I'm a retired Engineer, civil (consulting)nurse, so, I should know what to be monitoring about my asthma".  Denies any changes in medication since the last visit.  She states that mail order home delivery medications still on the process and will discuss with primary care physician about it at follow-up appointment on 11/28. In the meantime, son is still assisting with her medications to make sure she is taking everything as instructed.   Update on sitter/caregiver list provided last visit and she informed care management coordinator that "there's no need for anybody right now". Private pay housekeeper came yesterday to clean their house (comes every week) per patient. According to patient, son comes in and does some and husband does some of what needs to be done in the house. She verbalized that both her and husband are depending on their social security checks. [facility] to see what help they could get.   Patient has not been on Metformin for diabetes since HgbA1c's had been good: 7/26= 5.6; 9/29= 6.2). She admits not checking her blood pressure since  discharged from the hospital. Encouraged patient to start monitoring blood pressure (on Lopressor) and recording it. Patient states "I guess, I can try".  Patient mentioned that she still has slight swelling on both legs but had improved since last hospitalization. She reports wearing of compression stockings and "elevating legs every evening for couple of hours". She states, she will definitely get in touch with her primary care doctor about her low albumin from hospital blood works.  Patient denies any other issues or concerns at this time. She is aware to call [facility], care management coordinator and 24 hour nurse line if needed. Patient agreed to transition of care call next week.  Plan: Transition of care call on 10/28.  Honest Vanleer A. Samrat Hayward, BSN, RN-BC [facility] Community Care Management Coordinator Cell: [phone]

## 2015-02-13 NOTE — Patient Outreach (Signed)
[facility](THN) Care Management  [facility]   02/13/2015  [name] PolingMargaret M [name] 1941-01-23 [id number]  Subjective: [name] CarryMargaret M [name] is a 74 y.o. female who [facility] for medication assistance.  It was noted that the patient spoke with [name] HasteLorraine [name], [facility], and told her that she was still working on mail order. She plans to have all of her medications transitioned to mail order at her next primary care provider visit in November. In the meantime her son will assist her with getting her medications and managing them.   Objective:   Current Medications: Current Outpatient Prescriptions  Medication Sig Dispense Refill  . albuterol (PROAIR HFA) 108 (90 BASE) MCG/ACT inhaler Inhale 2 puffs into the lungs every 6 (six) hours as needed for wheezing or shortness of breath. 8.5 each 11  . azithromycin (ZITHROMAX Z-PAK) 250 MG tablet 2 day 1, then 1 qd (Patient not taking: Reported on 02/13/2015) 6 tablet 0  . budesonide-formoterol (SYMBICORT) 160-4.5 MCG/ACT inhaler Inhale 2 puffs into the lungs 2 (two) times daily. 1 Inhaler 1  . esomeprazole (NEXIUM) 40 MG capsule Take 1 capsule (40 mg total) by mouth 2 (two) times daily before a meal. 180 capsule 3  . fluticasone (FLONASE) 50 MCG/ACT nasal spray Place 2 sprays into both nostrils daily. 48 g 3  . levothyroxine (SYNTHROID) 50 MCG tablet 1 before breakfast EXCEPT 1/2 on Tues & Thurs 90 tablet 1  . lisinopril (PRINIVIL,ZESTRIL) 20 MG tablet Take 1 tablet (20 mg total) by mouth daily. 30 tablet 1  . loratadine (CLARITIN) 10 MG tablet Take 10 mg by mouth daily.    . metoprolol tartrate (LOPRESSOR) 25 MG tablet Take 1 tablet (25 mg total) by mouth 2 (two) times daily. 60 tablet 1  . montelukast (SINGULAIR) 10 MG tablet Take 1 tablet (10 mg total) by mouth at bedtime. 90 tablet 3  . predniSONE (DELTASONE) 20 MG tablet Take 3 tablets (60 mg total) by mouth daily with breakfast. X 2 days, then 2 tabs (40 mg)  daily x 2 days, then 1 tab (20mg ) daily x 2 days (Patient not taking: Reported on 02/06/2015) 12 tablet 0  . sodium chloride (OCEAN) 0.65 % SOLN nasal spray Place 2 sprays into both nostrils 4 (four) times daily. (Patient taking differently: Place 2 sprays into both nostrils daily. ) 90 mL 0  . tolterodine (DETROL LA) 4 MG 24 hr capsule Take 1 capsule (4 mg total) by mouth daily. 90 capsule 3  . warfarin (COUMADIN) 5 MG tablet Take 1 tab (5 mg) on Mon/Friday and 1 1/2 tabs (7.5 mg) tablets on Sun/Tues/Wed/Thurs/Sat. 40 tablet 3   No current facility-administered medications for this visit.    Functional Status: In your present state of health, do you have any difficulty performing the following activities: 02/06/2015 01/21/2015  Hearing? - N  Vision? - N  Difficulty concentrating or making decisions? - Y  Walking or climbing stairs? - Y  Dressing or bathing? - N  Doing errands, shopping? - Y  Preparing Food and eating ? Y -  Using the Toilet? N -  In the past six months, have you accidently leaked urine? Y -  Do you have problems with loss of bowel control? N -  Managing your Medications? Y -  Managing your Finances? Y -  Housekeeping or managing your Housekeeping? Y -    Fall/Depression Screening: PHQ 2/9 Scores 02/06/2015 04/12/2013  PHQ - 2 Score 3 0  PHQ-  9 Score 7 -    Assessment: 1. Medication assistance: patient still getting medications from [facility] but has her son helping her to get all of her medications and to manage them.   Plan: 1. Medication assistance: no recommendations or interventions at this time. Will close out of pharmacy program.    [name] [name], PharmD, [facility] Clinical Pharmacist [facility] [phone]

## 2015-02-13 NOTE — Patient Outreach (Signed)
[facility](THN) Care Management  02/13/2015  [name] 07/06/1940 [id number]   Assessment: Transition of care call Call placed to speak with patient but unable to reach her. Left HIPAA compliant voice message with name and contact number. Call placed to both patient's husband ([name] [name]) and patient's son [name] [name]([name]) but unable to reach them as well. HIPAA compliant voice message left with name and contact information.  Plan: Will await for return call. If unable to receive any call back, will schedule patient for the next outreach call.  Lerae Langham A. Darvin Dials, BSN, RN-BC [facility] Community Care Management Coordinator Cell: [phone]

## 2015-02-20 ENCOUNTER — Other Ambulatory Visit: Payer: Self-pay | Admitting: *Deleted

## 2015-02-20 ENCOUNTER — Other Ambulatory Visit: Payer: Self-pay | Admitting: Pharmacist

## 2015-02-20 NOTE — Patient Outreach (Signed)
[facility]) Care Management  02/20/2015  Lina Hitch Lozier-Arps January 31, 1941 [id number]   Assessment: Transition of care call Called and spoke with patient who reports that she is "feeling pretty good". "Slow progress but improving",as she further states. Patient reports no problem with breathing but the "usual getting tired when completing one flight of stairs inside the house". According to patient, she gets tired- "but not as quickly or profoundly as I did before". "I can do a little bit more with my usual activities now". Encouraged patient to take rest periods at intervals or between activities to avoid asthma flare-up and she expressed understanding. She reports that private pay housekeeper continues to clean/ vacuum  their house once a week. She was able to evaluate self using asthma zone tool and states "on green today".  Ms. Malen Gauze- Arps denies signs of asthma flare-up such as coughing, wheezing, tightness of chest, headache and trouble sleeping. She verbalized having "very good appetite", eating the food that her "good cook" son prepares. No reports of Albuterol use was verbalized. She mentions having "little bit of slight cloudy mucus from sinuses". Encouraged patient to monitor mucus for any color changes and notify doctor if any or if it gets worse.  Care management coordinator continued to encourage patient to read and review asthma booklet to relearn the warning signs of asthma flare-up and how to manage her condition.   Per patient, her swelling is still present but decreased to just ankles and feet now. She reports elevating bilateral lower extremities in the evenings and use of compression socks. Patient mentioned to care management coordinator that her hospital blood works showed she has "hypoalbuminemia" and wondering if that was causing her swelling to lower extremities. Encourage patient to discuss the condition with primary care doctor on her follow-up visit. In  the meantime, educational hand out about hypoalbuminemia is mailed to her for better understanding and information. Patient also verbalized trying to improve her nutrition/ diet to eat more protein such as: milk, cheese, meat and yogurt.   She was also encouraged to continue checking her blood pressure and recording it. Reinforced to her, the need to monitor blood pressure and keeping a record in relation to her medications and for her doctor to review. Patient had reported one blood pressure reading from yesterday = 142/60.  She informed care management coordinator that she can barely tolerate the pain of her upper arm when blood pressure is checked, so, her daughter will be sending her a wrist blood pressure device to use.  Patient mentioned to care management coordinator regarding her husband who is getting old, weak and needing more care and it worries her that she is not able to provide the care he needs because of her health. Asked patient of their plans for placement--for husband and she replied, "will have problem convincing him to go to such", "besides, no funds for placement and his family/ children are scattered" ([place], [place] and [place]). Re offered to patient our [facility] social work services to assist with community resources that they are eligible for, however, patient states "I do not want you to do anything about it yet", "I prefer to talk to my husband's family first about getting help from them". Patient was encouraged to do what she thinks is best for now and to notify care management coordinator if she needs to be referred to [facility] social worker for assistance or when further needs arise. Patient agreed to it.  Patient denies any other needs at  this time. She agreed to transition of care next week. Encourage her to call [facility], care management coordinator or 24-hour nurse line as needed.   Plan: Transition of care call on 04/28/14.  Avrian Delfavero A. Laiken Sandy, BSN, RN-BC [facility] Community Care  Management Coordinator Cell: [phone]

## 2015-02-20 NOTE — Patient Outreach (Signed)
[facility](THN) Care Management  [facility]   02/20/2015  [name] [name] 06-Sep-1940 [id number]  Subjective: [name] CarryMargaret M [name] is a 74 y.o. female who [facility] for medication assistance.  I spoke with Wyatt HasteLorraine Ajel, RNCM, and was told that the patient had still not gotten mail order pharmacy and that she was confused on the process. I attempted to call the patient but I was unable to reach her.    Objective:   Current Medications: Current Outpatient Prescriptions  Medication Sig Dispense Refill  . albuterol (PROAIR HFA) 108 (90 BASE) MCG/ACT inhaler Inhale 2 puffs into the lungs every 6 (six) hours as needed for wheezing or shortness of breath. 8.5 each 11  . azithromycin (ZITHROMAX Z-PAK) 250 MG tablet 2 day 1, then 1 qd (Patient not taking: Reported on 02/13/2015) 6 tablet 0  . budesonide-formoterol (SYMBICORT) 160-4.5 MCG/ACT inhaler Inhale 2 puffs into the lungs 2 (two) times daily. 1 Inhaler 1  . esomeprazole (NEXIUM) 40 MG capsule Take 1 capsule (40 mg total) by mouth 2 (two) times daily before a meal. 180 capsule 3  . fluticasone (FLONASE) 50 MCG/ACT nasal spray Place 2 sprays into both nostrils daily. 48 g 3  . levothyroxine (SYNTHROID) 50 MCG tablet 1 before breakfast EXCEPT 1/2 on Tues & Thurs 90 tablet 1  . lisinopril (PRINIVIL,ZESTRIL) 20 MG tablet Take 1 tablet (20 mg total) by mouth daily. 30 tablet 1  . loratadine (CLARITIN) 10 MG tablet Take 10 mg by mouth daily.    . metoprolol tartrate (LOPRESSOR) 25 MG tablet Take 1 tablet (25 mg total) by mouth 2 (two) times daily. 60 tablet 1  . montelukast (SINGULAIR) 10 MG tablet Take 1 tablet (10 mg total) by mouth at bedtime. 90 tablet 3  . predniSONE (DELTASONE) 20 MG tablet Take 3 tablets (60 mg total) by mouth daily with breakfast. X 2 days, then 2 tabs (40 mg) daily x 2 days, then 1 tab (20mg ) daily x 2 days (Patient not taking: Reported on 02/06/2015) 12 tablet 0  .  sodium chloride (OCEAN) 0.65 % SOLN nasal spray Place 2 sprays into both nostrils 4 (four) times daily. (Patient taking differently: Place 2 sprays into both nostrils daily. ) 90 mL 0  . tolterodine (DETROL LA) 4 MG 24 hr capsule Take 1 capsule (4 mg total) by mouth daily. 90 capsule 3  . warfarin (COUMADIN) 5 MG tablet Take 1 tab (5 mg) on Mon/Friday and 1 1/2 tabs (7.5 mg) tablets on Sun/Tues/Wed/Thurs/Sat. 40 tablet 3   No current facility-administered medications for this visit.    Functional Status: In your present state of health, do you have any difficulty performing the following activities: 02/20/2015 02/06/2015  Hearing? N -  Vision? N -  Difficulty concentrating or making decisions? Y -  Walking or climbing stairs? Y -  Dressing or bathing? N -  Doing errands, shopping? Y -  Quarry managerreparing Food and eating ? - Y  Using the Toilet? - N  In the past six months, have you accidently leaked urine? - Y  Do you have problems with loss of bowel control? - N  Managing your Medications? - Y  Managing your Finances? - Y  Housekeeping or managing your Housekeeping? - Y    Fall/Depression Screening: PHQ 2/9 Scores 02/06/2015 04/12/2013  PHQ - 2 Score 3 0  PHQ- 9 Score 7 -    Assessment: 1. Medication assistance: her son is aware of how  to get mail order set up but the patient is confused. No medications have been sent to [facility] for mail order yet.   Plan: 1. Medication assistance: I called the patient to discuss mail order pharmacy and I had to leave a HIPAA compliant message for patient to return my phone call.  I will reach out on 03/02/15 if the patient does not return my call today.    [name], PharmD, BCPS Clinical Pharmacist [facility] [phone]

## 2015-02-20 NOTE — Patient Outreach (Signed)
Attempted to call patient's son who I had spoken to last time about setting up mail order medications for his mother.  I called the him to to follow up and I had to leave a HIPAA compliant message for patient to return my phone call.  I will reach out on 03/02/15 if the patient does not return my call today.  Juanita CraverStacey Karl, PharmD, BCPS Clinical Pharmacist [facility] [phone]

## 2015-02-27 ENCOUNTER — Other Ambulatory Visit: Payer: Self-pay | Admitting: *Deleted

## 2015-02-27 NOTE — Patient Outreach (Addendum)
[facility](THN) Care Management  02/27/2015  [name] PolingMargaret M [name] July 04, 1940 [id number]  Assessment: Transition of care call Call placed to patient but not able to reach her. HIPAA compliant voice message left with name and contact information. Call placed to patient's husband but no answer. HIPAA compliant voice message left with name and contact number.  Plan: Will await for return call. If unable to receive a call back, will schedule for next outreach call.    Addendum:  Assessment: Call placed to patient and was able to speak with her. Patient reports she is "stressed out" and "drained" because her "husband is very sick and is in the hospital". Patient states "that's why I have not done anything for myself". She reports spending time in the hospital with her husband and comes home to rest. She told care management coordinator that she has not checked her blood pressure as what she is suppose to do. Told patient of being sorry about her sick husband and just check her blood pressure when she is able.  According to patient, she has had no trouble breathing and no change in appetite so far. She used her Albuterol only once this week (yesterday). She denies any wheezing, headache, chest tightness or asthma flare-up. States on the "green zone". Denies no changes in medications and has her medication supplies. She reports unable to sleep last night thinking about her husband. Encouraged patient to take as much rest/ sleep as she could to prevent flare-up.  Patient reports having her usual sinus drainage and noted stuffy nose since yesterday- currently using Fluticasone and saline spray for it. Coughing is very minimal as stated, with slight mucus noted towards the evening with some cloudy color. Encouraged patient to continue to monitor condition and notify her doctor as needed.   Per patient report, she had been in contact with husband's family and her sister-in-law is  researching on placement for now- for patient's husband and it sounds like they will assume his care, as verbalized by patient. She also reports that hospital social worker told patient she is going to assist with finding placement for her husband after discharge.   Patient states she had received in the mail the low albumin print out which she was interested about, but had no chance to look at it as she had been so busy with her sick husband this week.  She agreed to transition of care call next week. She knows to call [facility], care management coordinator and 24-hour nurse line if needed.   Plan: Transition of care call 03/04/15.  [name] [name], BSN, RN-BC [facility] Community Care Management Coordinator Cell: [phone]

## 2015-03-02 ENCOUNTER — Other Ambulatory Visit: Payer: Self-pay | Admitting: Pharmacist

## 2015-03-02 NOTE — Patient Outreach (Signed)
[facility](THN) Care Management  [facility]   03/02/2015  [name] 08/31/1940 [id number]  Subjective: [name] CarryMargaret M [name] is a 74 y.o. female who [facility] for assistance with setting up mail order pharmacy services.   I spoke to the patient today and she reports that she is still [facility]   She also reports that her husband is "in critical condition" at the hospital and she needs to go.    Objective:   Current Medications: Current Outpatient Prescriptions  Medication Sig Dispense Refill  . albuterol (PROAIR HFA) 108 (90 BASE) MCG/ACT inhaler Inhale 2 puffs into the lungs every 6 (six) hours as needed for wheezing or shortness of breath. 8.5 each 11  . azithromycin (ZITHROMAX Z-PAK) 250 MG tablet 2 day 1, then 1 qd (Patient not taking: Reported on 02/13/2015) 6 tablet 0  . budesonide-formoterol (SYMBICORT) 160-4.5 MCG/ACT inhaler Inhale 2 puffs into the lungs 2 (two) times daily. 1 Inhaler 1  . esomeprazole (NEXIUM) 40 MG capsule Take 1 capsule (40 mg total) by mouth 2 (two) times daily before a meal. 180 capsule 3  . fluticasone (FLONASE) 50 MCG/ACT nasal spray Place 2 sprays into both nostrils daily. 48 g 3  . levothyroxine (SYNTHROID) 50 MCG tablet 1 before breakfast EXCEPT 1/2 on Tues & Thurs 90 tablet 1  . lisinopril (PRINIVIL,ZESTRIL) 20 MG tablet Take 1 tablet (20 mg total) by mouth daily. 30 tablet 1  . loratadine (CLARITIN) 10 MG tablet Take 10 mg by mouth daily.    . metoprolol tartrate (LOPRESSOR) 25 MG tablet Take 1 tablet (25 mg total) by mouth 2 (two) times daily. 60 tablet 1  . montelukast (SINGULAIR) 10 MG tablet Take 1 tablet (10 mg total) by mouth at bedtime. 90 tablet 3  . predniSONE (DELTASONE) 20 MG tablet Take 3 tablets (60 mg total) by mouth daily with breakfast. X 2 days, then 2 tabs (40 mg) daily x 2 days, then 1 tab (20mg ) daily x 2 days (Patient not  taking: Reported on 02/06/2015) 12 tablet 0  . sodium chloride (OCEAN) 0.65 % SOLN nasal spray Place 2 sprays into both nostrils 4 (four) times daily. (Patient taking differently: Place 2 sprays into both nostrils daily. ) 90 mL 0  . tolterodine (DETROL LA) 4 MG 24 hr capsule Take 1 capsule (4 mg total) by mouth daily. 90 capsule 3  . warfarin (COUMADIN) 5 MG tablet Take 1 tab (5 mg) on Mon/Friday and 1 1/2 tabs (7.5 mg) tablets on Sun/Tues/Wed/Thurs/Sat. 40 tablet 3   No current facility-administered medications for this visit.    Functional Status: In your present state of health, do you have any difficulty performing the following activities: 02/20/2015 02/06/2015  Hearing? N -  Vision? N -  Difficulty concentrating or making decisions? Y -  Walking or climbing stairs? Y -  Dressing or bathing? N -  Doing errands, shopping? Y -  Quarry managerreparing Food and eating ? - Y  Using the Toilet? - N  In the past six months, have you accidently leaked urine? - Y  Do you have problems with loss of bowel control? - N  Managing your Medications? - Y  Managing your Finances? - Y  Housekeeping or managing your Housekeeping? - Y    Fall/Depression Screening: PHQ 2/9 Scores 02/06/2015 04/12/2013  PHQ - 2 Score 3 0  PHQ- 9 Score 7 -    Assessment:  1. Medication assistance: patient is interested in mail order services for her medications.   Plan: 1. Medication assistance: educated patient on how to set up mail order. However, was not able to discuss at length as patient had to leave to go see her husband. Will follow up on Friday 03/06/15 to see if she has any further questions.    [name], PharmD, BCPS Clinical Pharmacist [facility] [phone]

## 2015-03-04 ENCOUNTER — Other Ambulatory Visit: Payer: Self-pay | Admitting: *Deleted

## 2015-03-04 NOTE — Patient Outreach (Signed)
[facility](THN) Care Management  03/04/2015  [name] [name] [birth year] [id number]   Assessment: Transition of care call Call placed to patient and husband but unable to reach both. HIPAA compliant voice message left with name and contact number.  Plan: Will await for return call. If unable to receive a call back, will schedule patient for next outreach call.  Kiyani Jernigan A. Ridley Schewe, BSN, RN-BC [facility] Community Care Management Coordinator Cell: [phone]

## 2015-03-05 ENCOUNTER — Other Ambulatory Visit: Payer: Self-pay | Admitting: *Deleted

## 2015-03-05 NOTE — Patient Outreach (Signed)
[facility](THN) Care Management  03/05/2015  [name] 1941/01/07 [id number]   Assessment: Transition of care follow-up call Call placed and spoke with patient who states that she is "feeling stressed" between going to [facility] where her husband was discharged to- after hospitalization and [place].  Patient honestly admits that she has not been checking her blood pressure because of "so much going on". She states that she is also waiting for her daughter to send the blood pressure monitor she was told. Care plan goal for monitoring her blood pressure was restarted and restated. Inspite of her busy schedule, patient states she has not been having trouble breathing. No use of Albuterol (rescue) inhaler reported since our last conversation. Patient states being on the "green zone" so far. She denies coughing, mucus production, wheezing, chest discomfort and shortness of breath. Patient verbalized having her usual stuffy nose related to sinus congestion but it is not something that bothers her and states saline and Fluticasone spray takes care of it. Per patient's report, she is doing well with her usual activities and trying to take rest periods as much as she can.  Patient reports no changes with her medications and she has all her medication supplies. [facility] working with [facility] on medication home delivery.   Patient mentions that son is handling business matters for her. Her son still continues to cook/ prepare food and does her groceries as stated. Patient reports that follow-up appointment with primary care provider is 03/23/15. She denies any urgent needs at this time and agreed to routine visit next month. Patient is aware to call [facility], care management and 24-hour nurse line when needed.   Plan: Routine home visit on 12/12.  Sidnee Gambrill A. Tymar Polyak, BSN, RN-BC [facility] Community  Care Management Coordinator Cell: [phone]

## 2015-03-06 ENCOUNTER — Other Ambulatory Visit: Payer: Self-pay | Admitting: Pharmacist

## 2015-03-06 NOTE — Patient Outreach (Signed)
[facility]) Care Management  [facility]   03/06/2015  [name] [name] 09-28-1940 [id number]  Subjective: [name] [name] is a 74 y.o. female who was referred to [facility] for medication assistance and medication management.    Patient thinks that shet would benefit from mail order pharmacy so that the medications will all be sent to her home. She reports that her husband gets his medications sent to the home and she would like that for herself as well.   Objective:   Current Medications: Current Outpatient Prescriptions  Medication Sig Dispense Refill  . albuterol (PROAIR HFA) 108 (90 BASE) MCG/ACT inhaler Inhale 2 puffs into the lungs every 6 (six) hours as needed for wheezing or shortness of breath. 8.5 each 11  . azithromycin (ZITHROMAX Z-PAK) 250 MG tablet 2 day 1, then 1 qd (Patient not taking: Reported on 02/13/2015) 6 tablet 0  . budesonide-formoterol (SYMBICORT) 160-4.5 MCG/ACT inhaler Inhale 2 puffs into the lungs 2 (two) times daily. 1 Inhaler 1  . esomeprazole (NEXIUM) 40 MG capsule Take 1 capsule (40 mg total) by mouth 2 (two) times daily before a meal. 180 capsule 3  . fluticasone (FLONASE) 50 MCG/ACT nasal spray Place 2 sprays into both nostrils daily. 48 g 3  . levothyroxine (SYNTHROID) 50 MCG tablet 1 before breakfast EXCEPT 1/2 on Tues & Thurs 90 tablet 1  . lisinopril (PRINIVIL,ZESTRIL) 20 MG tablet Take 1 tablet (20 mg total) by mouth daily. 30 tablet 1  . loratadine (CLARITIN) 10 MG tablet Take 10 mg by mouth daily.    . metoprolol tartrate (LOPRESSOR) 25 MG tablet Take 1 tablet (25 mg total) by mouth 2 (two) times daily. 60 tablet 1  . montelukast (SINGULAIR) 10 MG tablet Take 1 tablet (10 mg total) by mouth at bedtime. 90 tablet 3  . predniSONE (DELTASONE) 20 MG tablet Take 3 tablets (60 mg total) by mouth daily with breakfast. X 2 days, then 2 tabs (40 mg) daily x 2 days, then 1 tab ( ) daily x 2 days (Patient not  taking: Reported on 02/06/2015) 12 tablet 0  . sodium chloride (OCEAN) 0.65 % SOLN nasal spray Place 2 sprays into both nostrils 4 (four) times daily. (Patient taking differently: Place 2 sprays into both nostrils daily. ) 90 mL 0  . tolterodine (DETROL LA) 4 MG 24 hr capsule Take 1 capsule (4 mg total) by mouth daily. 90 capsule 3  . warfarin (COUMADIN) 5 MG tablet Take 1 tab (5 mg) on Mon/Friday and 1 1/2 tabs (7.5 mg) tablets on Sun/Tues/Wed/Thurs/Sat. 40 tablet 3   No current facility-administered medications for this visit.    Functional Status: In your present state of health, do you have any difficulty performing the following activities: 02/20/2015 02/06/2015  Hearing? N -  Vision? N -  Difficulty concentrating or making decisions? Y -  Walking or climbing stairs? Y -  Dressing or bathing? N -  Doing errands, shopping? Y -  Quarry manager and eating ? - Y  Using the Toilet? - N  In the past six months, have you accidently leaked urine? - Y  Do you have problems with loss of bowel control? - N  Managing your Medications? - Y  Managing your Finances? - Y  Housekeeping or managing your Housekeeping? - Y    Fall/Depression Screening: PHQ 2/9 Scores 02/06/2015 04/12/2013  PHQ - 2 Score 3 0  PHQ- 9 Score 7 -    Assessment: 1. Medication assistance:  patient does not know how to set up mail order pharmacy.   Plan: 1. Medication assistance: I have educated both the son and the patient on how to set up mail order pharmacy [facility]. The prescriber, Dr. Yetta BarreJones, will need to send all new prescriptions to the mail order pharmacy. Patient verbalized understanding and reported that she would discuss with her doctor at her visit with him at the end of the month. She denies any other pharmacy issues at this time.   Juanita CraverStacey Karl, PharmD, BCPS Clinical Pharmacist [facility] [phone]

## 2015-03-23 ENCOUNTER — Encounter: Payer: Self-pay | Admitting: Internal Medicine

## 2015-03-23 ENCOUNTER — Other Ambulatory Visit (INDEPENDENT_AMBULATORY_CARE_PROVIDER_SITE_OTHER): Payer: Medicare Other

## 2015-03-23 ENCOUNTER — Ambulatory Visit (INDEPENDENT_AMBULATORY_CARE_PROVIDER_SITE_OTHER): Payer: Medicare Other | Admitting: Internal Medicine

## 2015-03-23 VITALS — BP 148/78 | HR 63 | Temp 98.0°F | Resp 16 | Ht 66.0 in | Wt 216.0 lb

## 2015-03-23 DIAGNOSIS — R600 Localized edema: Secondary | ICD-10-CM

## 2015-03-23 DIAGNOSIS — I519 Heart disease, unspecified: Secondary | ICD-10-CM

## 2015-03-23 DIAGNOSIS — I481 Persistent atrial fibrillation: Secondary | ICD-10-CM | POA: Diagnosis not present

## 2015-03-23 DIAGNOSIS — I10 Essential (primary) hypertension: Secondary | ICD-10-CM | POA: Diagnosis not present

## 2015-03-23 DIAGNOSIS — I5189 Other ill-defined heart diseases: Secondary | ICD-10-CM

## 2015-03-23 DIAGNOSIS — I4819 Other persistent atrial fibrillation: Secondary | ICD-10-CM

## 2015-03-23 DIAGNOSIS — J301 Allergic rhinitis due to pollen: Secondary | ICD-10-CM | POA: Diagnosis not present

## 2015-03-23 DIAGNOSIS — E038 Other specified hypothyroidism: Secondary | ICD-10-CM

## 2015-03-23 LAB — CBC WITH DIFFERENTIAL/PLATELET
BASOS ABS: 0.1 10*3/uL (ref 0.0–0.1)
Basophils Relative: 1.2 % (ref 0.0–3.0)
EOS ABS: 0.3 10*3/uL (ref 0.0–0.7)
Eosinophils Relative: 4.7 % (ref 0.0–5.0)
HCT: 36.1 % (ref 36.0–46.0)
Hemoglobin: 11.9 g/dL — ABNORMAL LOW (ref 12.0–15.0)
LYMPHS ABS: 1.5 10*3/uL (ref 0.7–4.0)
LYMPHS PCT: 20.5 % (ref 12.0–46.0)
MCHC: 33 g/dL (ref 30.0–36.0)
MCV: 88.8 fl (ref 78.0–100.0)
MONOS PCT: 7.6 % (ref 3.0–12.0)
Monocytes Absolute: 0.6 10*3/uL (ref 0.1–1.0)
NEUTROS ABS: 4.8 10*3/uL (ref 1.4–7.7)
NEUTROS PCT: 66 % (ref 43.0–77.0)
PLATELETS: 319 10*3/uL (ref 150.0–400.0)
RBC: 4.07 Mil/uL (ref 3.87–5.11)
RDW: 16.5 % — ABNORMAL HIGH (ref 11.5–15.5)
WBC: 7.3 10*3/uL (ref 4.0–10.5)

## 2015-03-23 LAB — BASIC METABOLIC PANEL
BUN: 16 mg/dL (ref 6–23)
CALCIUM: 9 mg/dL (ref 8.4–10.5)
CHLORIDE: 105 meq/L (ref 96–112)
CO2: 28 meq/L (ref 19–32)
CREATININE: 0.75 mg/dL (ref 0.40–1.20)
GFR: 79.74 mL/min (ref >60.00)
GLUCOSE: 126 mg/dL — AB (ref 70–99)
Potassium: 3.9 mEq/L (ref 3.5–5.1)
Sodium: 139 mEq/L (ref 135–145)

## 2015-03-23 LAB — BRAIN NATRIURETIC PEPTIDE: PRO B NATRI PEPTIDE: 78 pg/mL (ref 0.0–100.0)

## 2015-03-23 LAB — HEPATIC FUNCTION PANEL
ALK PHOS: 76 U/L (ref 39–117)
ALT: 13 U/L (ref 0–35)
AST: 15 U/L (ref 0–37)
Albumin: 3.7 g/dL (ref 3.5–5.2)
BILIRUBIN DIRECT: 0.1 mg/dL (ref 0.0–0.3)
Total Bilirubin: 0.5 mg/dL (ref 0.2–1.2)
Total Protein: 6.7 g/dL (ref 6.0–8.3)

## 2015-03-23 MED ORDER — WARFARIN SODIUM 5 MG PO TABS: ORAL_TABLET | ORAL | Status: DC

## 2015-03-23 MED ORDER — LEVOTHYROXINE SODIUM 50 MCG PO TABS: ORAL_TABLET | ORAL | Status: DC

## 2015-03-23 MED ORDER — TORSEMIDE 20 MG PO TABS: 20.0000 mg | ORAL_TABLET | Freq: Every day | ORAL | Status: DC

## 2015-03-23 MED ORDER — FLUTICASONE PROPIONATE 50 MCG/ACT NA SUSP: 2.0000 | Freq: Every day | NASAL | Status: DC

## 2015-03-23 NOTE — Patient Instructions (Signed)
Edema °Edema is an abnormal buildup of fluids in your body tissues. Edema is somewhat dependent on gravity to pull the fluid to the lowest place in your body. That makes the condition more common in the legs and thighs (lower extremities). Painless swelling of the feet and ankles is common and becomes more likely as you get older. It is also common in looser tissues, like around your eyes.  °When the affected area is squeezed, the fluid may move out of that spot and leave a dent for a few moments. This dent is called pitting.  °CAUSES  °There are many possible causes of edema. Eating too much salt and being on your feet or sitting for a long time can cause edema in your legs and ankles. Hot weather may make edema worse. Common medical causes of edema include: °· Heart failure. °· Liver disease. °· Kidney disease. °· Weak blood vessels in your legs. °· Cancer. °· An injury. °· Pregnancy. °· Some medications. °· Obesity.  °SYMPTOMS  °Edema is usually painless. Your skin may look swollen or shiny.  °DIAGNOSIS  °Your health care provider may be able to diagnose edema by asking about your medical history and doing a physical exam. You may need to have tests such as X-rays, an electrocardiogram, or blood tests to check for medical conditions that may cause edema.  °TREATMENT  °Edema treatment depends on the cause. If you have heart, liver, or kidney disease, you need the treatment appropriate for these conditions. General treatment may include: °· Elevation of the affected body part above the level of your heart. °· Compression of the affected body part. Pressure from elastic bandages or support stockings squeezes the tissues and forces fluid back into the blood vessels. This keeps fluid from entering the tissues. °· Restriction of fluid and salt intake. °· Use of a water pill (diuretic). These medications are appropriate only for some types of edema. They pull fluid out of your body and make you urinate more often. This  gets rid of fluid and reduces swelling, but diuretics can have side effects. Only use diuretics as directed by your health care provider. °HOME CARE INSTRUCTIONS  °· Keep the affected body part above the level of your heart when you are lying down.   °· Do not sit still or stand for prolonged periods.   °· Do not put anything directly under your knees when lying down. °· Do not wear constricting clothing or garters on your upper legs.   °· Exercise your legs to work the fluid back into your blood vessels. This may help the swelling go down.   °· Wear elastic bandages or support stockings to reduce ankle swelling as directed by your health care provider.   °· Eat a low-salt diet to reduce fluid if your health care provider recommends it.   °· Only take medicines as directed by your health care provider.  °SEEK MEDICAL CARE IF:  °· Your edema is not responding to treatment. °· You have heart, liver, or kidney disease and notice symptoms of edema. °· You have edema in your legs that does not improve after elevating them.   °· You have sudden and unexplained weight gain. °SEEK IMMEDIATE MEDICAL CARE IF:  °· You develop shortness of breath or chest pain.   °· You cannot breathe when you lie down. °· You develop pain, redness, or warmth in the swollen areas.   °· You have heart, liver, or kidney disease and suddenly get edema. °· You have a fever and your symptoms suddenly get worse. °MAKE SURE YOU:  °·   Understand these instructions. °· Will watch your condition. °· Will get help right away if you are not doing well or get worse. °  °This information is not intended to replace advice given to you by your health care provider. Make sure you discuss any questions you have with your health care provider. °  °Document Released: 04/11/2005 Document Revised: 05/02/2014 Document Reviewed: 02/01/2013 °Elsevier Interactive Patient Education ©2016 [employer] ° °

## 2015-03-23 NOTE — Progress Notes (Signed)
Subjective:  Patient ID: [name] [name], female    DOB: 1940-07-24  Age: 74 y.o. MRN: [id number]  CC: Hypertension   HPI [name] [name] presents for f/up on HTN, she complains of worsening edema with discomfort in her lower legs over the last few weeks. She also has a persistent NP cough.  Outpatient Prescriptions Prior to Visit  Medication Sig Dispense Refill  . albuterol (PROAIR HFA) 108 (90 BASE) MCG/ACT inhaler Inhale 2 puffs into the lungs every 6 (six) hours as needed for wheezing or shortness of breath. 8.5 each 11  . budesonide-formoterol (SYMBICORT) 160-4.5 MCG/ACT inhaler Inhale 2 puffs into the lungs 2 (two) times daily. 1 Inhaler 1  . esomeprazole (NEXIUM) 40 MG capsule Take 1 capsule (40 mg total) by mouth 2 (two) times daily before a meal. 180 capsule 3  . loratadine (CLARITIN) 10 MG tablet Take 10 mg by mouth daily.    . metoprolol tartrate (LOPRESSOR) 25 MG tablet Take 1 tablet (25 mg total) by mouth 2 (two) times daily. 60 tablet 1  . montelukast (SINGULAIR) 10 MG tablet Take 1 tablet (10 mg total) by mouth at bedtime. 90 tablet 3  . sodium chloride (OCEAN) 0.65 % SOLN nasal spray Place 2 sprays into both nostrils 4 (four) times daily. (Patient taking differently: Place 2 sprays into both nostrils daily. ) 90 mL 0  . tolterodine (DETROL LA) 4 MG 24 hr capsule Take 1 capsule (4 mg total) by mouth daily. 90 capsule 3  . fluticasone (FLONASE) 50 MCG/ACT nasal spray Place 2 sprays into both nostrils daily. 48 g 3  . levothyroxine (SYNTHROID) 50 MCG tablet 1 before breakfast EXCEPT 1/2 on Tues & Thurs 90 tablet 1  . lisinopril (PRINIVIL,ZESTRIL) 20 MG tablet Take 1 tablet (20 mg total) by mouth daily. 30 tablet 1  . predniSONE (DELTASONE) 20 MG tablet Take 3 tablets (60 mg total) by mouth daily with breakfast. X 2 days, then 2 tabs (40 mg) daily x 2 days, then 1 tab ( ) daily x 2 days 12 tablet 0  . warfarin (COUMADIN) 5 MG tablet Take 1 tab (5 mg) on  Mon/Friday and 1 1/2 tabs (7.5 mg) tablets on Sun/Tues/Wed/Thurs/Sat. 40 tablet 3  . azithromycin (ZITHROMAX Z-PAK) 250 MG tablet 2 day 1, then 1 qd (Patient not taking: Reported on 02/13/2015) 6 tablet 0   No facility-administered medications prior to visit.    ROS Review of Systems  Constitutional: Positive for fatigue. Negative for fever, chills, diaphoresis, activity change, appetite change and unexpected weight change.  HENT: Negative.   Eyes: Negative.   Respiratory: Positive for cough and shortness of breath. Negative for apnea, choking, chest tightness, wheezing and stridor.   Cardiovascular: Positive for leg swelling. Negative for chest pain and palpitations.  Gastrointestinal: Negative.  Negative for nausea, vomiting, abdominal pain, diarrhea, constipation and blood in stool.  Endocrine: Negative.   Genitourinary: Negative.   Musculoskeletal: Negative.  Negative for myalgias, back pain, arthralgias and neck pain.  Skin: Negative.  Negative for pallor.  Allergic/Immunologic: Negative.   Neurological: Negative.  Negative for dizziness, tremors, weakness, light-headedness, numbness and headaches.  Hematological: Negative.  Negative for adenopathy. Does not bruise/bleed easily.  Psychiatric/Behavioral: Negative.     Objective:  BP 148/78 mmHg  Pulse 63  Temp(Src) 98 F (36.7 C) (Oral)  Resp 16  Ht  (1.676 m)  Wt 216 lb (97.977 kg)  BMI 34.88 kg/m2  SpO2 98%  LMP 12/01/2010  BP Readings from  Last 3 Encounters:  03/23/15 148/78  02/06/15 154/98  02/03/15 140/80    Wt Readings from Last 3 Encounters:  03/23/15 216 lb (97.977 kg)  02/03/15 212 lb (96.163 kg)  01/24/15 200 lb 6.4 oz (90.9 kg)    Physical Exam  Constitutional: She is oriented to person, place, and time. No distress.  HENT:  Head: Normocephalic and atraumatic.  Mouth/Throat: Oropharynx is clear and moist. No oropharyngeal exudate.  Eyes: Conjunctivae are normal. Right eye exhibits no  discharge. Left eye exhibits no discharge. No scleral icterus.  Neck: Normal range of motion. Neck supple. No JVD present. No tracheal deviation present. No thyromegaly present.  Cardiovascular: Normal rate, regular rhythm, normal heart sounds and intact distal pulses.  Exam reveals no gallop and no friction rub.   No murmur heard. Pulmonary/Chest: Effort normal and breath sounds normal. No stridor. No respiratory distress. She has no wheezes. She has no rales. She exhibits no tenderness.  Abdominal: Soft. Bowel sounds are normal. She exhibits no distension and no mass. There is no tenderness. There is no rebound and no guarding.  Musculoskeletal: Normal range of motion. She exhibits edema (2++ pitting edema in BLE). She exhibits no tenderness.  Lymphadenopathy:    She has no cervical adenopathy.  Neurological: She is oriented to person, place, and time.  Skin: Skin is warm and dry. No rash noted. She is not diaphoretic. No erythema. No pallor.  Psychiatric: She has a normal mood and affect. Her behavior is normal. Judgment and thought content normal.  Vitals reviewed.   Lab Results  Component Value Date   WBC 7.3 03/23/2015   HGB 11.9* 03/23/2015   HCT 36.1 03/23/2015   PLT 319.0 03/23/2015   GLUCOSE 126* 03/23/2015   CHOL 208* 06/19/2014   TRIG 100.0 06/19/2014   HDL 61.70 06/19/2014   LDLCALC 126* 06/19/2014   ALT 13 03/23/2015   AST 15 03/23/2015   NA 139 03/23/2015   K 3.9 03/23/2015   CL 105 03/23/2015   CREATININE 0.75 03/23/2015   BUN 16 03/23/2015   CO2 28 03/23/2015   TSH 0.678 01/22/2015   INR 2.3* 02/03/2015   HGBA1C 6.2* 01/22/2015   MICROALBUR 1.2 09/28/2012    Dg Chest Portable 1 View  01/20/2015  CLINICAL DATA:  Shortness of breath with right-sided chest pain for 1 week EXAM: PORTABLE CHEST 1 VIEW COMPARISON:  Aug 25, 2013 FINDINGS: There is no edema or consolidation. The heart size and pulmonary vascularity are normal. No pneumothorax. No adenopathy. No bone  lesions. IMPRESSION: No edema or consolidation. Electronically Signed   By: Bretta BangWilliam  Woodruff III M.D.   On: 01/20/2015 19:04    Assessment & Plan:   Claris CheMargaret was seen today for hypertension.  Diagnoses and all orders for this visit:  Other specified hypothyroidism- her recent TSh was in the normal range, will stay on the current synthroid dose -     levothyroxine (SYNTHROID) 50 MCG tablet; 1 before breakfast EXCEPT 1/2 on Tues & Thurs  Non-seasonal allergic rhinitis due to pollen -     fluticasone (FLONASE) 50 MCG/ACT nasal spray; Place 2 sprays into both nostrils daily.  Diastolic dysfunction- will get better BP control with a loop diuretic, she has a persistent NP cough so I will stop the ACEI -     torsemide (DEMADEX) 20 MG tablet; Take 1 tablet (20 mg total) by mouth daily. -     CBC with Differential/Platelet; Future -     Basic metabolic panel;  Future -     Brain natriuretic peptide; Future  Persistent atrial fibrillation (HCC) -     warfarin (COUMADIN) 5 MG tablet; Take 1 tab (5 mg) on Mon/Friday and 1 1/2 tabs (7.5 mg) tablets on Sun/Tues/Wed/Thurs/Sat.  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 -     CBC with Differential/Platelet; Future -     Hepatic function panel; Future -     Basic metabolic panel; Future -     Brain natriuretic peptide; Future -     D-dimer, quantitative (not at [facility]); Future -     LE VENOUS; Future  Other orders -     Cancel: lisinopril (PRINIVIL,ZESTRIL) 20 MG tablet; Take 1 tablet (20 mg total) by mouth daily. -     Cancel: warfarin (COUMADIN) 7.5 MG tablet;   I have discontinued Ms. [name]'s lisinopril, predniSONE, and azithromycin. I am also having her start on torsemide.  Additionally, I am having her maintain her loratadine, sodium chloride, esomeprazole, montelukast, albuterol, tolterodine, budesonide-formoterol, metoprolol tartrate, warfarin, levothyroxine, fluticasone, and warfarin.  Meds ordered this encounter  Medications  . warfarin (COUMADIN) 5 MG tablet    Sig: Take 1 tab (5 mg) on Mon/Friday and 1 1/2 tabs (7.5 mg) tablets on Sun/Tues/Wed/Thurs/Sat.    Dispense:  40 tablet    Refill:  3    30 day  . levothyroxine (SYNTHROID) 50 MCG tablet    Sig: 1 before breakfast EXCEPT 1/2 on Tues & Thurs    Dispense:  90 tablet    Refill:  1  . fluticasone (FLONASE) 50 MCG/ACT nasal spray    Sig: Place 2 sprays into both nostrils daily.    Dispense:  48 g    Refill:  3  . warfarin (COUMADIN) 7.5 MG tablet    Sig: TAKE 1 TABLET BY MOUTH AT 6 PM ON SUNDAY TUESDAY WEDNESDAY THURSDAY AND SATURDAY    Refill:  0  . torsemide (DEMADEX) 20 MG tablet    Sig: Take 1 tablet (20 mg total) by mouth daily.    Dispense:  90 tablet    Refill:  1     Follow-up: Return in about 2 months (around 05/23/2015).  Sanda Linger, MD

## 2015-03-30 ENCOUNTER — Inpatient Hospital Stay (HOSPITAL_COMMUNITY)
Admission: EM | Admit: 2015-03-30 | Discharge: 2015-04-02 | DRG: 189 | Disposition: A | Discharge status: Home / Self Care | Payer: Medicare Other | Attending: Internal Medicine | Admitting: Internal Medicine

## 2015-03-30 ENCOUNTER — Emergency Department (HOSPITAL_COMMUNITY): Payer: Medicare Other

## 2015-03-30 ENCOUNTER — Encounter (HOSPITAL_COMMUNITY): Payer: Self-pay | Admitting: *Deleted

## 2015-03-30 DIAGNOSIS — Z7901 Long term (current) use of anticoagulants: Secondary | ICD-10-CM | POA: Diagnosis not present

## 2015-03-30 DIAGNOSIS — R509 Fever, unspecified: Secondary | ICD-10-CM | POA: Diagnosis not present

## 2015-03-30 DIAGNOSIS — J45901 Unspecified asthma with (acute) exacerbation: Secondary | ICD-10-CM | POA: Diagnosis present

## 2015-03-30 DIAGNOSIS — N3281 Overactive bladder: Secondary | ICD-10-CM | POA: Diagnosis present

## 2015-03-30 DIAGNOSIS — I10 Essential (primary) hypertension: Secondary | ICD-10-CM | POA: Diagnosis present

## 2015-03-30 DIAGNOSIS — Z825 Family history of asthma and other chronic lower respiratory diseases: Secondary | ICD-10-CM

## 2015-03-30 DIAGNOSIS — I480 Paroxysmal atrial fibrillation: Secondary | ICD-10-CM | POA: Diagnosis present

## 2015-03-30 DIAGNOSIS — E118 Type 2 diabetes mellitus with unspecified complications: Secondary | ICD-10-CM | POA: Diagnosis present

## 2015-03-30 DIAGNOSIS — Z833 Family history of diabetes mellitus: Secondary | ICD-10-CM

## 2015-03-30 DIAGNOSIS — I5189 Other ill-defined heart diseases: Secondary | ICD-10-CM | POA: Diagnosis present

## 2015-03-30 DIAGNOSIS — Z8249 Family history of ischemic heart disease and other diseases of the circulatory system: Secondary | ICD-10-CM | POA: Diagnosis not present

## 2015-03-30 DIAGNOSIS — R05 Cough: Secondary | ICD-10-CM | POA: Diagnosis not present

## 2015-03-30 DIAGNOSIS — I519 Heart disease, unspecified: Secondary | ICD-10-CM | POA: Diagnosis not present

## 2015-03-30 DIAGNOSIS — E039 Hypothyroidism, unspecified: Secondary | ICD-10-CM | POA: Diagnosis present

## 2015-03-30 DIAGNOSIS — J441 Chronic obstructive pulmonary disease with (acute) exacerbation: Secondary | ICD-10-CM | POA: Diagnosis not present

## 2015-03-30 DIAGNOSIS — E1165 Type 2 diabetes mellitus with hyperglycemia: Secondary | ICD-10-CM | POA: Diagnosis present

## 2015-03-30 DIAGNOSIS — I481 Persistent atrial fibrillation: Secondary | ICD-10-CM | POA: Diagnosis not present

## 2015-03-30 DIAGNOSIS — I5033 Acute on chronic diastolic (congestive) heart failure: Secondary | ICD-10-CM | POA: Diagnosis present

## 2015-03-30 DIAGNOSIS — R0602 Shortness of breath: Secondary | ICD-10-CM | POA: Diagnosis not present

## 2015-03-30 DIAGNOSIS — E038 Other specified hypothyroidism: Secondary | ICD-10-CM | POA: Diagnosis not present

## 2015-03-30 DIAGNOSIS — J9601 Acute respiratory failure with hypoxia: Secondary | ICD-10-CM | POA: Diagnosis present

## 2015-03-30 DIAGNOSIS — Z818 Family history of other mental and behavioral disorders: Secondary | ICD-10-CM

## 2015-03-30 DIAGNOSIS — R059 Cough, unspecified: Secondary | ICD-10-CM

## 2015-03-30 DIAGNOSIS — I4891 Unspecified atrial fibrillation: Secondary | ICD-10-CM | POA: Diagnosis present

## 2015-03-30 DIAGNOSIS — R0600 Dyspnea, unspecified: Secondary | ICD-10-CM | POA: Diagnosis not present

## 2015-03-30 LAB — PROCALCITONIN: Procalcitonin: <0.1 ng/mL

## 2015-03-30 LAB — CBC WITH DIFFERENTIAL/PLATELET
BASOS PCT: 0 %
Basophils Absolute: 0 10*3/uL (ref 0.0–0.1)
EOS ABS: 0 10*3/uL (ref 0.0–0.7)
EOS PCT: 0 %
HCT: 40.8 % (ref 36.0–46.0)
HEMOGLOBIN: 13.5 g/dL (ref 12.0–15.0)
Lymphocytes Relative: 7 %
Lymphs Abs: 0.6 10*3/uL — ABNORMAL LOW (ref 0.7–4.0)
MCH: 29.9 pg (ref 26.0–34.0)
MCHC: 33.1 g/dL (ref 30.0–36.0)
MCV: 90.5 fL (ref 78.0–100.0)
MONOS PCT: 9 %
Monocytes Absolute: 0.9 10*3/uL (ref 0.1–1.0)
NEUTROS PCT: 84 %
Neutro Abs: 8 10*3/uL — ABNORMAL HIGH (ref 1.7–7.7)
PLATELETS: 277 10*3/uL (ref 150–400)
RBC: 4.51 MIL/uL (ref 3.87–5.11)
RDW: 15.4 % (ref 11.5–15.5)
WBC: 9.6 10*3/uL (ref 4.0–10.5)

## 2015-03-30 LAB — COMPREHENSIVE METABOLIC PANEL
ALK PHOS: 81 U/L (ref 38–126)
ALT: 20 U/L (ref 14–54)
AST: 29 U/L (ref 15–41)
Albumin: 3.6 g/dL (ref 3.5–5.0)
Anion gap: 9 (ref 5–15)
BUN: 16 mg/dL (ref 6–20)
CALCIUM: 9.1 mg/dL (ref 8.9–10.3)
CO2: 28 mmol/L (ref 22–32)
CREATININE: 0.93 mg/dL (ref 0.44–1.00)
Chloride: 99 mmol/L — ABNORMAL LOW (ref 101–111)
GFR, EST NON AFRICAN AMERICAN: 58 mL/min — AB (ref >60)
Glucose, Bld: 134 mg/dL — ABNORMAL HIGH (ref 65–99)
Potassium: 3.7 mmol/L (ref 3.5–5.1)
Sodium: 136 mmol/L (ref 135–145)
Total Bilirubin: 0.6 mg/dL (ref 0.3–1.2)
Total Protein: 7.2 g/dL (ref 6.5–8.1)

## 2015-03-30 LAB — I-STAT CG4 LACTIC ACID, ED: LACTIC ACID, VENOUS: 1.94 mmol/L (ref 0.5–2.0)

## 2015-03-30 LAB — BRAIN NATRIURETIC PEPTIDE: B Natriuretic Peptide: 290.7 pg/mL — ABNORMAL HIGH (ref 0.0–100.0)

## 2015-03-30 LAB — GLUCOSE, CAPILLARY: Glucose-Capillary: 257 mg/dL — ABNORMAL HIGH (ref 65–99)

## 2015-03-30 LAB — PROTIME-INR
INR: 1.49 (ref 0.00–1.49)
PROTHROMBIN TIME: 18.1 s — AB (ref 11.6–15.2)

## 2015-03-30 MED ORDER — METHYLPREDNISOLONE SODIUM SUCC 125 MG IJ SOLR
125.0000 mg | Freq: Once | INTRAMUSCULAR | Status: AC
  Administered 2015-03-30: 125 mg via INTRAVENOUS
  Filled 2015-03-30: qty 2

## 2015-03-30 MED ORDER — WARFARIN SODIUM 7.5 MG PO TABS
7.5000 mg | ORAL_TABLET | Freq: Once | ORAL | Status: AC
  Administered 2015-03-30: 7.5 mg via ORAL
  Filled 2015-03-30: qty 1

## 2015-03-30 MED ORDER — IPRATROPIUM-ALBUTEROL 0.5-2.5 (3) MG/3ML IN SOLN
3.0000 mL | Freq: Once | RESPIRATORY_TRACT | Status: AC
  Administered 2015-03-30: 3 mL via RESPIRATORY_TRACT
  Filled 2015-03-30: qty 3

## 2015-03-30 MED ORDER — METOPROLOL TARTRATE 25 MG PO TABS
25.0000 mg | ORAL_TABLET | Freq: Two times a day (BID) | ORAL | Status: DC
  Administered 2015-03-30 – 2015-04-02 (×6): 25 mg via ORAL
  Filled 2015-03-30 (×6): qty 1

## 2015-03-30 MED ORDER — ALBUTEROL SULFATE (2.5 MG/3ML) 0.083% IN NEBU
2.5000 mg | INHALATION_SOLUTION | RESPIRATORY_TRACT | Status: DC | PRN
  Administered 2015-03-31: 2.5 mg via RESPIRATORY_TRACT
  Filled 2015-03-30: qty 3

## 2015-03-30 MED ORDER — ALBUTEROL SULFATE (2.5 MG/3ML) 0.083% IN NEBU
5.0000 mg | INHALATION_SOLUTION | Freq: Once | RESPIRATORY_TRACT | Status: AC
  Administered 2015-03-30: 5 mg via RESPIRATORY_TRACT
  Filled 2015-03-30: qty 6

## 2015-03-30 MED ORDER — FLUTICASONE PROPIONATE 50 MCG/ACT NA SUSP
2.0000 | Freq: Every day | NASAL | Status: DC
  Administered 2015-03-31 – 2015-04-02 (×3): 2 via NASAL
  Filled 2015-03-30: qty 16

## 2015-03-30 MED ORDER — MONTELUKAST SODIUM 10 MG PO TABS
10.0000 mg | ORAL_TABLET | Freq: Every day | ORAL | Status: DC
  Administered 2015-03-30 – 2015-04-01 (×3): 10 mg via ORAL
  Filled 2015-03-30 (×3): qty 1

## 2015-03-30 MED ORDER — LORATADINE 10 MG PO TABS
10.0000 mg | ORAL_TABLET | Freq: Every day | ORAL | Status: DC
  Administered 2015-03-31 – 2015-04-02 (×3): 10 mg via ORAL
  Filled 2015-03-30 (×3): qty 1

## 2015-03-30 MED ORDER — WARFARIN - PHARMACIST DOSING INPATIENT: Freq: Every day | Status: DC

## 2015-03-30 MED ORDER — BUDESONIDE-FORMOTEROL FUMARATE 160-4.5 MCG/ACT IN AERO
2.0000 | INHALATION_SPRAY | Freq: Two times a day (BID) | RESPIRATORY_TRACT | Status: DC
Start: 2015-03-30 — End: 2015-04-02
  Administered 2015-03-31 – 2015-04-02 (×5): 2 via RESPIRATORY_TRACT
  Filled 2015-03-30: qty 6

## 2015-03-30 MED ORDER — SALINE SPRAY 0.65 % NA SOLN
2.0000 | Freq: Two times a day (BID) | NASAL | Status: DC
Start: 2015-03-30 — End: 2015-04-02
  Administered 2015-03-30 – 2015-04-01 (×5): 2 via NASAL
  Filled 2015-03-30: qty 44

## 2015-03-30 MED ORDER — LEVOTHYROXINE SODIUM 25 MCG PO TABS
25.0000 ug | ORAL_TABLET | Freq: Every day | ORAL | Status: DC
  Administered 2015-03-31: 25 ug via ORAL
  Filled 2015-03-30: qty 1

## 2015-03-30 MED ORDER — FESOTERODINE FUMARATE ER 8 MG PO TB24
8.0000 mg | ORAL_TABLET | Freq: Every day | ORAL | Status: DC
  Administered 2015-03-31 – 2015-04-02 (×3): 8 mg via ORAL
  Filled 2015-03-30 (×3): qty 1

## 2015-03-30 MED ORDER — INSULIN ASPART 100 UNIT/ML ~~LOC~~ SOLN
0.0000 [IU] | Freq: Three times a day (TID) | SUBCUTANEOUS | Status: DC
  Administered 2015-03-31: 2 [IU] via SUBCUTANEOUS
  Administered 2015-03-31: 1 [IU] via SUBCUTANEOUS
  Administered 2015-03-31 – 2015-04-01 (×2): 2 [IU] via SUBCUTANEOUS
  Administered 2015-04-01: 3 [IU] via SUBCUTANEOUS
  Administered 2015-04-01 – 2015-04-02 (×3): 2 [IU] via SUBCUTANEOUS

## 2015-03-30 MED ORDER — TORSEMIDE 20 MG PO TABS
20.0000 mg | ORAL_TABLET | Freq: Every day | ORAL | Status: DC
  Administered 2015-03-31 – 2015-04-02 (×3): 20 mg via ORAL
  Filled 2015-03-30 (×3): qty 1

## 2015-03-30 MED ORDER — ALBUTEROL SULFATE (2.5 MG/3ML) 0.083% IN NEBU
2.5000 mg | INHALATION_SOLUTION | RESPIRATORY_TRACT | Status: DC
  Administered 2015-03-30 – 2015-03-31 (×4): 2.5 mg via RESPIRATORY_TRACT
  Filled 2015-03-30 (×5): qty 3

## 2015-03-30 MED ORDER — PANTOPRAZOLE SODIUM 40 MG PO TBEC
80.0000 mg | DELAYED_RELEASE_TABLET | Freq: Every day | ORAL | Status: DC
  Administered 2015-03-31 – 2015-04-02 (×3): 80 mg via ORAL
  Filled 2015-03-30 (×3): qty 2

## 2015-03-30 MED ORDER — INSULIN ASPART 100 UNIT/ML ~~LOC~~ SOLN
0.0000 [IU] | Freq: Every day | SUBCUTANEOUS | Status: DC
  Administered 2015-03-30 – 2015-03-31 (×2): 2 [IU] via SUBCUTANEOUS

## 2015-03-30 NOTE — ED Notes (Addendum)
Pt reports having a productive cough x 2 days with white and green sputum. Denies fever but temp 102.4 at triage. Pt reports being allergic to tylenol and ibuprofen and unable to take anything for a fever. No acute resp distress noted at triage.

## 2015-03-30 NOTE — ED Provider Notes (Signed)
CSN: [id number]     Arrival date & time 03/30/15  1203 History   First MD Initiated Contact with Patient 03/30/15 [phone]     Chief Complaint  Patient presents with  . Cough   HPI  Ms. Lozier-Arps is a 74 year old female with past medical history of paroxysmal a fib on coumadin, COPD, asthma and CHF presenting with cough and shortness of breath. She reports a cough for the past week which has been productive of white, green and yellow sputum. She denies coughing up blood. She also endorses increased shortness of breath with exertion. She states that she has had to use her albuterol inhaler more frequently than usual. She denies an oxygen requirement at home. She also notes increased bilateral leg swelling. She has been seen by her primary care provider for this and started on torsemide. She has no other complaints at this time. Denies fevers, chills, dizziness, headache, syncope, congestion, rhinorrhea, sore throat, chest pain, abdominal pain, nausea, vomiting or numbness/tingling in the extremities.  Her son is at bedside and reports that his mother is downplaying the severity of her symptoms. He reports that she fell yesterday and was unable to get herself up do to dyspnea and remained down for approximately 2 hours. She denies any injuries sustained in the fall. He also notes that the patient was unable to ambulate without severe dyspnea when he saw her this morning. He also notes that she is occasionally noncompliant with her respiratory medications. Of note, her husband passed away one week ago and she now lives alone.   Past Medical History  Diagnosis Date  . Migraine, unspecified, without mention of intractable migraine without mention of status migrainosus   . HLD (hyperlipidemia)   . Aortic insufficiency   . Colon polyps   . Hypothyroidism   . GERD (gastroesophageal reflux disease)   . Osteoarthritis   . HTN (hypertension)   . Depression   . PAF (paroxysmal atrial fibrillation) (HCC)     on coumadin and Cardiazem  . Asthma   . Allergic rhinitis   . Chronic anticoagulation     on coumadin  . Obesity   . Diastolic dysfunction   . S/P cardiac cath 2003    no obstructive disease    Past Surgical History  Procedure Laterality Date  . Appendectomy    . Abdominal hysterectomy    . Tubal ligation    . US echocardiography  2007   Family History  Problem Relation Age of Onset  . Heart disease    . Hypertension    . COPD    . Crohn's disease    . Diabetes    . Depression    . Dementia     Social History  Substance Use Topics  . Smoking status: Never Smoker   . Smokeless tobacco: Never Used  . Alcohol Use: 3.0 oz/week    5 Glasses of wine per week   OB History    No data available     Review of Systems  Constitutional: Positive for fever. Negative for chills and diaphoresis.  HENT: Negative for congestion, rhinorrhea and sore throat.   Eyes: Negative for visual disturbance.  Respiratory: Positive for cough and shortness of breath.   Cardiovascular: Positive for leg swelling. Negative for chest pain.  Gastrointestinal: Negative for nausea, vomiting and abdominal pain.  Neurological: Negative for dizziness, syncope and headaches.  All other systems reviewed and are negative.     Allergies  Tetracycline; Acetaminophen; Avelox; Cardizem; Cefaclor; Cephalexin;  Clindamycin; Crestor; Ibuprofen; Nsaids; Penicillins; Pravastatin sodium; Simvastatin; Sulfonamide derivatives; Levaquin; and Lisinopril  Home Medications   Prior to Admission medications   Medication Sig Start Date End Date Taking? Authorizing Provider  albuterol (PROAIR HFA) 108 (90 BASE) MCG/ACT inhaler Inhale 2 puffs into the lungs every 6 (six) hours as needed for wheezing or shortness of breath. 11/18/14  Yes Etta Grandchild, MD  budesonide-formoterol [facility]) 160-4.5 MCG/ACT inhaler Inhale 2 puffs into the lungs 2 (two) times daily. 01/24/15  Yes Catarina Hartshorn, MD  esomeprazole (NEXIUM) 40 MG  capsule Take 1 capsule (40 mg total) by mouth 2 (two) times daily before a meal. 06/18/14  Yes Etta Grandchild, MD  fluticasone [facility]) 50 MCG/ACT nasal spray Place 2 sprays into both nostrils daily. 03/23/15  Yes Etta Grandchild, MD  levothyroxine (SYNTHROID) 50 MCG tablet 1 before breakfast EXCEPT 1/2 on Tues & Thurs Patient taking differently: Take 25-50 mcg by mouth daily before breakfast. 1 before breakfast EXCEPT 1/2 on Tues & Thurs 03/23/15  Yes Etta Grandchild, MD  lisinopril (PRINIVIL,ZESTRIL) 20 MG tablet Take 20 mg by mouth daily. 02/25/15  Yes Historical Provider, MD  loratadine (CLARITIN) 10 MG tablet Take 10 mg by mouth daily.   Yes Historical Provider, MD  metoprolol tartrate (LOPRESSOR) 25 MG tablet Take 1 tablet (25 mg total) by mouth 2 (two) times daily. 01/24/15  Yes Catarina Hartshorn, MD  montelukast (SINGULAIR) 10 MG tablet Take 1 tablet (10 mg total) by mouth at bedtime. 06/18/14  Yes Etta Grandchild, MD  sodium chloride (OCEAN) 0.65 % SOLN nasal spray Place 2 sprays into both nostrils 4 (four) times daily. Patient taking differently: Place 2 sprays into both nostrils 2 (two) times daily.  08/26/13  Yes Hoyt Koch, DO  tolterodine (DETROL LA) 4 MG 24 hr capsule Take 1 capsule (4 mg total) by mouth daily. 11/18/14  Yes Etta Grandchild, MD  torsemide (DEMADEX) 20 MG tablet Take 1 tablet (20 mg total) by mouth daily. 03/23/15  Yes Etta Grandchild, MD  warfarin (COUMADIN) 5 MG tablet Take 1 tab (5 mg) on Mon/Friday and 1 1/2 tabs (7.5 mg) tablets on Sun/Tues/Wed/Thurs/Sat. 03/23/15  Yes Etta Grandchild, MD   BP 147/52 mmHg  Pulse 75  Temp(Src) 99.4 F (37.4 C) (Oral)  Resp 18  Ht  (1.727 m)  Wt 93.486 kg  BMI 31.34 kg/m2  SpO2 96%  LMP 12/01/2010 Physical Exam  Constitutional: She appears well-developed and well-nourished. No distress.  Chronically ill appearing  HENT:  Head: Normocephalic and atraumatic.  Eyes: Conjunctivae are normal. Right eye exhibits no discharge. Left  eye exhibits no discharge. No scleral icterus.  Neck: Normal range of motion.  Cardiovascular: Normal rate.   Pulmonary/Chest: Effort normal. Tachypnea noted. She has wheezes. She has rales.  Tachypneic and increased work of breathing noted. Wheezes and rales noted all lung fields  Abdominal: Soft. There is no tenderness. There is no rebound.  Musculoskeletal: Normal range of motion. She exhibits no edema or tenderness.  Moves all extremities spontaneously and without pain.   Neurological: She is alert. Coordination normal.  Skin: Skin is warm and dry.  Psychiatric: She has a normal mood and affect. Her behavior is normal.  Nursing note and vitals reviewed.   ED Course  Procedures (including critical care time) Labs Review Labs Reviewed  COMPREHENSIVE METABOLIC PANEL - Abnormal; Notable for the following:    Chloride 99 (*)    Glucose, Bld 134 (*)  GFR calc non Af Amer 58 (*)    All other components within normal limits  CBC WITH DIFFERENTIAL/PLATELET - Abnormal; Notable for the following:    Neutro Abs 8.0 (*)    Lymphs Abs 0.6 (*)    All other components within normal limits  BRAIN NATRIURETIC PEPTIDE - Abnormal; Notable for the following:    B Natriuretic Peptide 290.7 (*)    All other components within normal limits  PROTIME-INR - Abnormal; Notable for the following:    Prothrombin Time 18.1 (*)    All other components within normal limits  GLUCOSE, CAPILLARY - Abnormal; Notable for the following:    Glucose-Capillary 257 (*)    All other components within normal limits  CULTURE, BLOOD (ROUTINE X 2)  CULTURE, BLOOD (ROUTINE X 2)  URINALYSIS, ROUTINE W REFLEX MICROSCOPIC (NOT [facility])  PROCALCITONIN  BASIC METABOLIC PANEL  CBC  PROTIME-INR  I-STAT CG4 LACTIC ACID, ED    Imaging Review Dg Chest 2 View  03/30/2015  CLINICAL DATA:  Cough, shortness of breath, fever EXAM: CHEST  2 VIEW COMPARISON:  01/20/2015 FINDINGS: The heart size and mediastinal contours are  within normal limits. Both lungs are clear. The visualized skeletal structures are unremarkable. IMPRESSION: No active cardiopulmonary disease. Electronically Signed   By: Charlett NoseKevin  Dover M.D.   On: 03/30/2015 13:39   I have personally reviewed and evaluated these images and lab results as part of my medical decision-making.   EKG Interpretation None      MDM   Final diagnoses:  Shortness of breath  Cough   74 year old female with past medical history of COPD, asthma and CHF presenting with increased dyspnea and cough x 1 week. She does not typically have an oxygen requirement and was high 80s-low 90s on RA on presentation. She was placed on 2 L via nasal cannula and her oxygenation improved to the mid to high 90s. She was also noted to be febrile to 102.4 on presentation. She has allergies to tylenol and NSAIDs. Her temperature has come down to 99.4 without intervention. She is tachypneic with increased work of breathing. Rales and wheezes heard throughout. Duoneb, proventil and solu-medrol given in ED. BNP slightly elevated to 290. CXR negative. Consulted hospitalist Dr. Maryfrances Bunnellanford for admission.     Rolm GalaStevi Drina Jobst, PA-C 03/30/15 2230  Pricilla LovelessScott Goldston, MD 03/31/15 0001

## 2015-03-30 NOTE — Progress Notes (Signed)
ANTICOAGULATION CONSULT NOTE - Initial Consult  Pharmacy Consult for warfarin Indication: atrial fibrillation  Allergies  Allergen Reactions  . Tetracycline Swelling    Swollen tongue  . Acetaminophen Other (See Comments)    ASTHMA MORE SEVERE  . Avelox [Moxifloxacin Hcl In Nacl] Hives, Itching and Rash  . Cardizem [Diltiazem Hcl] Swelling    edema  . Cefaclor Hives, Itching and Rash  . Cephalexin Hives, Itching and Rash  . Clindamycin Hives, Itching and Rash  . Crestor [Rosuvastatin] Other (See Comments)    Muscle aches  . Ibuprofen Other (See Comments)    MAKES ASTHMA WORSE  . Nsaids Other (See Comments)    Respiratory-Asthma WORSE  . Penicillins Hives  . Pravastatin Sodium Other (See Comments)    MYALGIAS  . Simvastatin Other (See Comments)    MYALGIAS  . Sulfonamide Derivatives Hives, Itching and Rash  . Levaquin [Levofloxacin In D5w] Rash  . Lisinopril Cough  Patient Measurements: Height:  (157.5 cm) IBW/kg (Calculated) : 50.1 Vital Signs: Temp: 99.4 F (37.4 C) (12/05 1853) Temp Source: Oral (12/05 1853) BP: 149/61 mmHg (12/05 2030) Pulse Rate: 81 (12/05 2030) Labs:  Recent Labs  03/30/15 1259 03/30/15 1733  HGB 13.5  --   HCT 40.8  --   PLT 277  --   LABPROT  --  18.1*  INR  --  1.49  CREATININE 0.93  --     Estimated Creatinine Clearance: 56.3 mL/min (by C-G formula based on Cr of 0.93).   Medical History: Past Medical History  Diagnosis Date  . Migraine, unspecified, without mention of intractable migraine without mention of status migrainosus   . HLD (hyperlipidemia)   . Aortic insufficiency   . Colon polyps   . Hypothyroidism   . GERD (gastroesophageal reflux disease)   . Osteoarthritis   . HTN (hypertension)   . Depression   . PAF (paroxysmal atrial fibrillation) (HCC)     on coumadin and Cardiazem  . Asthma   . Allergic rhinitis   . Chronic anticoagulation     on coumadin  . Obesity   . Diastolic dysfunction   . S/P  cardiac cath 2003    no obstructive disease     Medications:  Prescriptions prior to admission  Medication Sig Dispense Refill Last Dose  . albuterol (PROAIR HFA) 108 (90 BASE) MCG/ACT inhaler Inhale 2 puffs into the lungs every 6 (six) hours as needed for wheezing or shortness of breath. 8.5 each 11 03/30/2015 at Unknown time  . budesonide-formoterol (SYMBICORT) 160-4.5 MCG/ACT inhaler Inhale 2 puffs into the lungs 2 (two) times daily. 1 Inhaler 1 Past Week at Unknown time  . esomeprazole (NEXIUM) 40 MG capsule Take 1 capsule (40 mg total) by mouth 2 (two) times daily before a meal. 180 capsule 3 Past Week at Unknown time  . fluticasone (FLONASE) 50 MCG/ACT nasal spray Place 2 sprays into both nostrils daily. 48 g 3 Past Week at Unknown time  . levothyroxine (SYNTHROID) 50 MCG tablet 1 before breakfast EXCEPT 1/2 on Tues & Thurs (Patient taking differently: Take 25-50 mcg by mouth daily before breakfast. 1 before breakfast EXCEPT 1/2 on Tues & Thurs) 90 tablet 1 Past Week at Unknown time  . lisinopril (PRINIVIL,ZESTRIL) 20 MG tablet Take 20 mg by mouth daily.  1 Past Week at Unknown time  . loratadine (CLARITIN) 10 MG tablet Take 10 mg by mouth daily.   Past Week at Unknown time  . metoprolol tartrate (LOPRESSOR) 25 MG tablet  Take 1 tablet (25 mg total) by mouth 2 (two) times daily. 60 tablet 1 Past Week at UNKNOWN TIME  . montelukast (SINGULAIR) 10 MG tablet Take 1 tablet (10 mg total) by mouth at bedtime. 90 tablet 3 Past Week at Unknown time  . sodium chloride (OCEAN) 0.65 % SOLN nasal spray Place 2 sprays into both nostrils 4 (four) times daily. (Patient taking differently: Place 2 sprays into both nostrils 2 (two) times daily. ) 90 mL 0 Past Week at Unknown time  . tolterodine (DETROL LA) 4 MG 24 hr capsule Take 1 capsule (4 mg total) by mouth daily. 90 capsule 3 Past Week at Unknown time  . torsemide (DEMADEX) 20 MG tablet Take 1 tablet (20 mg total) by mouth daily. 90 tablet 1 Past Week at  Unknown time  . warfarin (COUMADIN) 5 MG tablet Take 1 tab (5 mg) on Mon/Friday and 1 1/2 tabs (7.5 mg) tablets on Sun/Tues/Wed/Thurs/Sat. 40 tablet 3 Past Week at Unknown time    Assessment: 74 year old female on chronic coumadin for atrial fibrillation. INR on admission is sub-therapeutic at 1.49 on this admission. Patient does state she has missed doses.   She states her home regimen is 5 mg on Mondays and Fridays and 7.5mg  all other days, however she has not been to her follow-up anticoagulation visits (last INR check in October) due to her husband's failing health. When I spoke with her she was drowsy and a poor historian. She stated her last dose was within the past week- but with further questions she stated she thought it was yesterday. She did clearly state that she had not taken any home medications today.   Goal of Therapy:  INR 2-3 Monitor platelets by anticoagulation protocol: Yes   Plan:  Warfarin 7.5mg  tonight - higher than home dose due to low INR and missed doses.  PT/INR daily while on therapy.  Follow-up with further education when patient is less drowsy.   Link SnufferJessica Bethsaida Siegenthaler, PharmD, BCPS Clinical Pharmacist [phone] 03/30/2015,9:12 PM

## 2015-03-30 NOTE — ED Notes (Signed)
Pt resting, 90% on RA, 2L O2 applied

## 2015-03-30 NOTE — H&P (Signed)
History and Physical  Patient Name: [name] [name]     ZOX:[id number]    DOB: 07/25/1940    DOA: 03/30/2015 Referring physician: Ailene Rud, PA-C PCP: Sanda Linger, MD      Chief Complaint: Cough and dyspnea  HPI: [name] [name] is a 74 y.o. female with a past medical history significant for pAF on warfarin, NIDDM, HTN, hypothyroidism, and chronic diastolic CHF who presents with cough and dyspnea.  The patient's husband passed away one week ago. Since that time she has had increasing dyspnea, productive cough, and wheezing. Today her son thought that she looked like she was having difficulty breathing and so he brought her to the ER.  In the ED, the patient was febrile to 102.11F, diffusely wheezy, and hypoxic but improved with nebulized bronchodilators.  The chest x-ray was clear. TR[facility] were asked to admit for hypoxic respiratory failure.  Of note, the patient gained 15 pounds and had 3+ pitting edema, and was started on torsemide 1 week ago by her PCP. Since then her edema has gotten much better.     Review of Systems:  Pt complains of wheezing, cough, dyspnea, dyspnea with exertion, productive cough, leg swelling. Pt denies any orthopnea, PND, worsening weight gain, fever, chills, rusty sputum.  All other systems negative except as just noted or noted in the history of present illness.  Allergies  Allergen Reactions  . Tetracycline Swelling    Swollen tongue  . Acetaminophen Other (See Comments)    ASTHMA MORE SEVERE  . Avelox [Moxifloxacin Hcl In Nacl] Hives, Itching and Rash  . Cardizem [Diltiazem Hcl] Swelling    edema  . Cefaclor Hives, Itching and Rash  . Cephalexin Hives, Itching and Rash  . Clindamycin Hives, Itching and Rash  . Crestor [Rosuvastatin] Other (See Comments)    Muscle aches  . Ibuprofen Other (See Comments)    MAKES ASTHMA WORSE  . Nsaids Other (See Comments)    Respiratory-Asthma WORSE  . Penicillins Hives  . Pravastatin Sodium  Other (See Comments)    MYALGIAS  . Simvastatin Other (See Comments)    MYALGIAS  . Sulfonamide Derivatives Hives, Itching and Rash  . Levaquin [Levofloxacin In D5w] Rash  . Lisinopril Cough    Prior to Admission medications   Medication Sig Start Date End Date Taking? Authorizing Provider  albuterol (PROAIR HFA) 108 (90 BASE) MCG/ACT inhaler Inhale 2 puffs into the lungs every 6 (six) hours as needed for wheezing or shortness of breath. 11/18/14  Yes Etta Grandchild, MD  budesonide-formoterol [facility]) 160-4.5 MCG/ACT inhaler Inhale 2 puffs into the lungs 2 (two) times daily. 01/24/15  Yes Catarina Hartshorn, MD  esomeprazole (NEXIUM) 40 MG capsule Take 1 capsule (40 mg total) by mouth 2 (two) times daily before a meal. 06/18/14  Yes Etta Grandchild, MD  fluticasone [facility]) 50 MCG/ACT nasal spray Place 2 sprays into both nostrils daily. 03/23/15  Yes Etta Grandchild, MD  levothyroxine (SYNTHROID) 50 MCG tablet 1 before breakfast EXCEPT 1/2 on Tues & Thurs Patient taking differently: Take 25-50 mcg by mouth daily before breakfast. 1 before breakfast EXCEPT 1/2 on Tues & Thurs 03/23/15  Yes Etta Grandchild, MD  lisinopril (PRINIVIL,ZESTRIL) 20 MG tablet Take 20 mg by mouth daily. 02/25/15  Yes Historical Provider, MD  loratadine (CLARITIN) 10 MG tablet Take 10 mg by mouth daily.   Yes Historical Provider, MD  metoprolol tartrate (LOPRESSOR) 25 MG tablet Take 1 tablet (25 mg total) by mouth 2 (two)  times daily. 01/24/15  Yes Catarina Hartshorn, MD  montelukast (SINGULAIR) 10 MG tablet Take 1 tablet (10 mg total) by mouth at bedtime. 06/18/14  Yes Etta Grandchild, MD  sodium chloride (OCEAN) 0.65 % SOLN nasal spray Place 2 sprays into both nostrils 4 (four) times daily. Patient taking differently: Place 2 sprays into both nostrils 2 (two) times daily.  08/26/13  Yes Hoyt Koch, DO  tolterodine (DETROL LA) 4 MG 24 hr capsule Take 1 capsule (4 mg total) by mouth daily. 11/18/14  Yes Etta Grandchild, MD  torsemide  (DEMADEX) 20 MG tablet Take 1 tablet (20 mg total) by mouth daily. 03/23/15  Yes Etta Grandchild, MD  warfarin (COUMADIN) 5 MG tablet Take 1 tab (5 mg) on Mon/Friday and 1 1/2 tabs (7.5 mg) tablets on Sun/Tues/Wed/Thurs/Sat. 03/23/15  Yes Etta Grandchild, MD    Past Medical History  Diagnosis Date  . Migraine, unspecified, without mention of intractable migraine without mention of status migrainosus   . HLD (hyperlipidemia)   . Aortic insufficiency   . Colon polyps   . Hypothyroidism   . GERD (gastroesophageal reflux disease)   . Osteoarthritis   . HTN (hypertension)   . Depression   . PAF (paroxysmal atrial fibrillation) (HCC)     on coumadin and Cardiazem  . Asthma   . Allergic rhinitis   . Chronic anticoagulation     on coumadin  . Obesity   . Diastolic dysfunction   . S/P cardiac cath 2003    no obstructive disease     Past Surgical History  Procedure Laterality Date  . Appendectomy    . Abdominal hysterectomy    . Tubal ligation    . US echocardiography  2007  . Replacement total knee Right     Family history: family history includes COPD in an other family member; Crohn's disease in an other family member; Dementia in an other family member; Depression in an other family member; Diabetes in an other family member; Heart disease in an other family member; Hypertension in an other family member.  Social History: Patient lives alone at home.  She still drives.  She walks without assistance.  She is a nonsmoker.  Her husband (who was very ill) died one week ago.       Physical Exam: BP 151/58 mmHg  Pulse 83  Temp(Src) 99.4 F (37.4 C) (Oral)  Resp 25  Ht  (1.575 m)  SpO2 93%  LMP 12/01/2010 General appearance: Well-developed, elderly adult female, alert and in mild distress from dyspnea.   Eyes: Anicteric, lids and lashes normal.     ENT: No nasal deformity, discharge, or epistaxis.  OP moist without lesions.   Lymph: No cervical lymphadenopathy. Skin:  Warm and dry.   Cardiac: RRR, nl S1-S2.  Capillary refill is brisk.  JVP normal.  1+ LE edema to knee.  Radial pulses 2+ and symmetric. Respiratory: Slightly increased WOB.  Diffuse high pitched wheezing in all fields. Abdomen: Abdomen soft without rigidity. No ascites, distension.   Neuro: Sensorium intact and responding to questions, attention normal.  Speech is fluent.  Moves all extremities equally and with normal coordination.    Psych: Behavior appropriate.  Affect tearful, mood "sad".  No evidence of aural or visual hallucinations or delusions.       Labs on Admission:  The metabolic panel shows normal sodium, potassium, renal function. Bicarbonate high normal. Transaminases and bilirubin are normal. The lactic acid level is normal.  The INR is normal. The BNP is slightly elevated at 290 pg per mL. The complete blood count shows no leukocytosis, anemia, or thrombocytopenia.   Radiological Exams on Admission: Personally reviewed: Dg Chest 2 View  03/30/2015  CLINICAL DATA:  Cough, shortness of breath, fever EXAM: CHEST  2 VIEW COMPARISON:  01/20/2015 FINDINGS: The heart size and mediastinal contours are within normal limits. Both lungs are clear. The visualized skeletal structures are unremarkable. IMPRESSION: No active cardiopulmonary disease. Electronically Signed   By: Charlett NoseKevin  Dover M.D.   On: 03/30/2015 13:39        Assessment/Plan 1. Hypoxic respiratory failure:  This is new.  Suspect this is asthma/COPD.  Her old PFTs were reviewed and show a reversible obstructive defect.  Doubt pneumonia, but we'll check Procalcitonin. Patient states she has been adherent to Symbicort (although her INR is subtherapeutic and she reports "not taking this medicine" this week). -Albuterol every 4 hours and every 2 hours when necessary -Prednisone 40 mg once daily for 5 days -Continue home Symbicort -Continue home Flonase and montelukast -Check Procalcitonin   2. A. Fib: CHADS2-VASc 5.  Subtherapeutic INR at admission. Continue warfarin and metoprolol   3. NIDDM:  Diet controlled.   Sliding scale insulin while on prednisone  4. HTN:  Continue metoprolol  Discontinue lisinopril again (patient's PCP stopped this one week ago, patient is still taking, she reports to me)  5. Hypothyroidism:  Stable.  Continue levothyroxine  6. Bladder dysfunction:  Stable.  Continue Detrol  7. Chronic diastolic CHF:  Stable. Improving with torsemide. At the time of admission I do not think this is controlled being substantially to her hypoxia. -Continue torsemide     DVT PPx: Warfarin Diet: Heart healthy Consultants: None Code Status: Intubate, no CPR Family Communication: The patient's son present at the bedside. All questions were answered. CODE STATUS was confirmed.  Medical decision making: What exists of the patient's previous chart was reviewed in depth and the case was discussed with Ailene RudStevie Barrett, PA-C. Patient seen 8:00 PM on 03/30/2015.  Disposition Plan:  Admit to tele for asthma exacerbation.  Anticipate 3-4 days hospitalization.  Procalcitonin to clarify likelihood of bacterial pneumonia and defer abx for now.        Alberteen SamChristopher P Marleta Lapierre [facility] Pager [phone]

## 2015-03-31 ENCOUNTER — Encounter (HOSPITAL_COMMUNITY): Payer: Self-pay | Admitting: Family Medicine

## 2015-03-31 ENCOUNTER — Other Ambulatory Visit: Payer: Self-pay | Admitting: *Deleted

## 2015-03-31 ENCOUNTER — Encounter: Payer: Self-pay | Admitting: *Deleted

## 2015-03-31 DIAGNOSIS — N3281 Overactive bladder: Secondary | ICD-10-CM

## 2015-03-31 DIAGNOSIS — I10 Essential (primary) hypertension: Secondary | ICD-10-CM

## 2015-03-31 DIAGNOSIS — I481 Persistent atrial fibrillation: Secondary | ICD-10-CM

## 2015-03-31 DIAGNOSIS — I519 Heart disease, unspecified: Secondary | ICD-10-CM

## 2015-03-31 DIAGNOSIS — E118 Type 2 diabetes mellitus with unspecified complications: Secondary | ICD-10-CM

## 2015-03-31 DIAGNOSIS — J45901 Unspecified asthma with (acute) exacerbation: Secondary | ICD-10-CM

## 2015-03-31 DIAGNOSIS — E038 Other specified hypothyroidism: Secondary | ICD-10-CM

## 2015-03-31 LAB — BASIC METABOLIC PANEL
Anion gap: 7 (ref 5–15)
BUN: 15 mg/dL (ref 6–20)
CHLORIDE: 99 mmol/L — AB (ref 101–111)
CO2: 28 mmol/L (ref 22–32)
Calcium: 8.5 mg/dL — ABNORMAL LOW (ref 8.9–10.3)
Creatinine, Ser: 0.87 mg/dL (ref 0.44–1.00)
GFR calc Af Amer: >60 mL/min (ref >60)
GFR calc non Af Amer: >60 mL/min (ref >60)
GLUCOSE: 205 mg/dL — AB (ref 65–99)
POTASSIUM: 3.7 mmol/L (ref 3.5–5.1)
Sodium: 134 mmol/L — ABNORMAL LOW (ref 135–145)

## 2015-03-31 LAB — URINALYSIS, ROUTINE W REFLEX MICROSCOPIC
Bilirubin Urine: NEGATIVE
Glucose, UA: 100 mg/dL — AB
Ketones, ur: 15 mg/dL — AB
NITRITE: NEGATIVE
PROTEIN: 30 mg/dL — AB
SPECIFIC GRAVITY, URINE: 1.022 (ref 1.005–1.030)
pH: 5.5 (ref 5.0–8.0)

## 2015-03-31 LAB — CBC
HEMATOCRIT: 36.8 % (ref 36.0–46.0)
Hemoglobin: 12.1 g/dL (ref 12.0–15.0)
MCH: 29.7 pg (ref 26.0–34.0)
MCHC: 32.9 g/dL (ref 30.0–36.0)
MCV: 90.4 fL (ref 78.0–100.0)
Platelets: 248 10*3/uL (ref 150–400)
RBC: 4.07 MIL/uL (ref 3.87–5.11)
RDW: 15.5 % (ref 11.5–15.5)
WBC: 7.8 10*3/uL (ref 4.0–10.5)

## 2015-03-31 LAB — GLUCOSE, CAPILLARY
GLUCOSE-CAPILLARY: 145 mg/dL — AB (ref 65–99)
GLUCOSE-CAPILLARY: 204 mg/dL — AB (ref 65–99)
Glucose-Capillary: 157 mg/dL — ABNORMAL HIGH (ref 65–99)
Glucose-Capillary: 175 mg/dL — ABNORMAL HIGH (ref 65–99)

## 2015-03-31 LAB — PROTIME-INR
INR: 1.5 — ABNORMAL HIGH (ref 0.00–1.49)
PROTHROMBIN TIME: 18.1 s — AB (ref 11.6–15.2)

## 2015-03-31 LAB — URINE MICROSCOPIC-ADD ON

## 2015-03-31 MED ORDER — LEVOTHYROXINE SODIUM 50 MCG PO TABS
50.0000 ug | ORAL_TABLET | ORAL | Status: DC
  Administered 2015-04-01: 50 ug via ORAL
  Filled 2015-03-31: qty 1

## 2015-03-31 MED ORDER — GI COCKTAIL ~~LOC~~
30.0000 mL | Freq: Once | ORAL | Status: AC
  Administered 2015-03-31: 30 mL via ORAL
  Filled 2015-03-31: qty 30

## 2015-03-31 MED ORDER — WARFARIN SODIUM 7.5 MG PO TABS
7.5000 mg | ORAL_TABLET | Freq: Once | ORAL | Status: AC
  Administered 2015-03-31: 7.5 mg via ORAL
  Filled 2015-03-31: qty 1

## 2015-03-31 MED ORDER — LEVOTHYROXINE SODIUM 25 MCG PO TABS
25.0000 ug | ORAL_TABLET | ORAL | Status: DC
  Administered 2015-04-02: 25 ug via ORAL
  Filled 2015-03-31: qty 1

## 2015-03-31 MED ORDER — METHYLPREDNISOLONE SODIUM SUCC 125 MG IJ SOLR
60.0000 mg | Freq: Two times a day (BID) | INTRAMUSCULAR | Status: DC
  Administered 2015-03-31 – 2015-04-02 (×4): 60 mg via INTRAVENOUS
  Filled 2015-03-31 (×4): qty 2

## 2015-03-31 MED ORDER — METHYLPREDNISOLONE SODIUM SUCC 125 MG IJ SOLR: 60.0000 mg | Freq: Four times a day (QID) | INTRAMUSCULAR | Status: DC

## 2015-03-31 MED ORDER — ALBUTEROL SULFATE (2.5 MG/3ML) 0.083% IN NEBU
2.5000 mg | INHALATION_SOLUTION | Freq: Three times a day (TID) | RESPIRATORY_TRACT | Status: DC
  Administered 2015-03-31 – 2015-04-02 (×6): 2.5 mg via RESPIRATORY_TRACT
  Filled 2015-03-31 (×6): qty 3

## 2015-03-31 MED ORDER — PREDNISONE 20 MG PO TABS
40.0000 mg | ORAL_TABLET | Freq: Every day | ORAL | Status: DC
  Administered 2015-03-31: 40 mg via ORAL
  Filled 2015-03-31: qty 2

## 2015-03-31 MED ORDER — FUROSEMIDE 10 MG/ML IJ SOLN
40.0000 mg | Freq: Every day | INTRAMUSCULAR | Status: DC
  Administered 2015-03-31 – 2015-04-01 (×2): 40 mg via INTRAVENOUS
  Filled 2015-03-31 (×2): qty 4

## 2015-03-31 NOTE — Patient Outreach (Signed)
[facility] Care Management  03/31/2015  Jackelyn PolingMargaret M Lozier-Arps 16-Mar-1941 [id number]   Assessment: Care coordination Patient was noted to be admitted at the hospital on 03/30/15 for shortness of breath/cough (asthma exacerbation). Per doctor's note patient's husband passed away a week ago.   Plan: Will monitor patient's status and continue with previous plan for home visit on 04/06/15 if discharged.  Abisola Carrero A. Tiny Rietz, BSN, RN-BC [facility] LPHN Community Care Management Coordinator Cell: [phone]

## 2015-03-31 NOTE — Evaluation (Signed)
Physical Therapy Evaluation Patient Details Name: [name] [name] MRN: [id number] DOB: 27-Dec-1940 Today's Date: 03/31/2015   History of Present Illness  [name] [name] is a 74 y.o. female with a past medical history significant for pAF on warfarin, NIDDM, HTN, hypothyroidism, and chronic diastolic CHF who presents with cough and dyspnea.  Note her husband passed away 03/25/15.    Clinical Impression  Pt presents with above.  Note decreased activity tolerance and increased coughing with activity, however encouraged pt to ambulate with nursing several times a day for improved lung function due to new dx of PNA.  Pt verbalized understanding.  Feel she will benefit from acute services to address deficits, but do not recommend any follow up at D/C.  SaO2 remained at 92% on RA throughout all mobility.     Follow Up Recommendations No PT follow up    Equipment Recommendations  None recommended by PT    Recommendations for Other Services       Precautions / Restrictions Precautions Precautions: Fall Restrictions Weight Bearing Restrictions: No      Mobility  Bed Mobility               General bed mobility comments: Pt sitting at EOB when PT arrived.   Transfers Overall transfer level: Modified independent Equipment used: None;Rolling walker (2 wheeled)             General transfer comment: Pt performed with and without use of RW (recommended RW for energy conservation), did both at mod I level.   Ambulation/Gait Ambulation/Gait assistance: Supervision Ambulation Distance (Feet): 150 Feet Assistive device: Rolling walker (2 wheeled) Gait Pattern/deviations: WFL(Within Functional Limits)     General Gait Details: Pt with slower gait speed, however no LOB noted.  Had pt ambulate with RW to conserve energy during mobility.  Tolerated well.  Ambulated to/from restroom in room without RW at S level.   Stairs            Wheelchair Mobility     Modified Rankin (Stroke Patients Only)       Balance Overall balance assessment: Needs assistance Sitting-balance support: Feet supported Sitting balance-Leahy Scale: Normal     Standing balance support: No upper extremity supported;During functional activity Standing balance-Leahy Scale: Fair                               Pertinent Vitals/Pain Pain Assessment: No/denies pain    Home Living Family/patient expects to be discharged to:: Private residence Living Arrangements: Children;Alone Available Help at Discharge: Family;Available PRN/intermittently Type of Home: House Home Access: Stairs to enter Entrance Stairs-Rails: None Entrance Stairs-Number of Steps: 1 then 2 more Home Layout: Two level;Able to live on main level with bedroom/bathroom Home Equipment: Walker - 4 wheels;Tub bench;Cane - single point      Prior Function Level of Independence: Independent               Hand Dominance   Dominant Hand: Right    Extremity/Trunk Assessment               Lower Extremity Assessment: Generalized weakness      Cervical / Trunk Assessment: Normal  Communication   Communication: No difficulties  Cognition Arousal/Alertness: Awake/alert Behavior During Therapy: WFL for tasks assessed/performed Overall Cognitive Status: Within Functional Limits for tasks assessed  General Comments      Exercises        Assessment/Plan    PT Assessment Patient needs continued PT services  PT Diagnosis Difficulty walking;Generalized weakness   PT Problem List Decreased strength;Decreased activity tolerance;Decreased balance;Decreased mobility;Cardiopulmonary status limiting activity  PT Treatment Interventions Gait training;Stair training;Functional mobility training;Therapeutic activities;Therapeutic exercise;Balance training;Patient/family education   PT Goals (Current goals can be found in the Care Plan section) Acute  Rehab PT Goals Patient Stated Goal: to return home PT Goal Formulation: With patient Time For Goal Achievement: 04/07/15 Potential to Achieve Goals: Good    Frequency Min 3X/week   Barriers to discharge        Co-evaluation               End of Session   Activity Tolerance: Patient limited by fatigue Patient left: in chair;with call bell/phone within reach Nurse Communication: Mobility status         Time: 1610-9604 PT Time Calculation (min) (ACUTE ONLY): 21 min   Charges:   PT Evaluation $Initial PT Evaluation Tier I: 1 Procedure     PT G CodesVista Deck 03/31/2015, 2:54 PM

## 2015-03-31 NOTE — Progress Notes (Signed)
[facility]                                                                              Patient Demographics  [name] [name], is a 74 y.o. female, DOB - 1941/04/01, ZOX:[id number]  Admit date - 03/30/2015   Admitting Physician Alberteen Sam, MD  Outpatient Primary MD for the patient is Sanda Linger, MD  LOS - 1   Chief Complaint  Patient presents with  . Cough       Brief HPI   [name] [name] is a 74 y.o. female with a past medical history significant for pAF on warfarin, NIDDM, HTN, hypothyroidism, and chronic diastolic CHF who presented with cough and dyspnea. The patient's husband passed away one week ago. Since that time she has had increasing dyspnea, productive cough, and wheezing. Today her son thought that she looked like she was having difficulty breathing and so he brought her to the ER. In the ED, the patient was febrile to 102.87F, diffusely wheezy, and hypoxic but improved with nebulized bronchodilators. The chest x-ray was clear. TRH were asked to admit for hypoxic respiratory failure. Of note, the patient gained 15 pounds and had 3+ pitting edema, and was started on torsemide 1 week ago by her PCP. Since then her edema has gotten much better.   Assessment & Plan    Principal Problem:  Acute Hypoxic respiratory failure: Likely due to asthma/COPD exacerbation and possibly acute on chronic diastolic CHF exacerbation - old PFTs showed reversible obstructive defect. Pro-calcitonin less than 0.1, lactic acid normal, BNP was slightly elevated at 290.  - Continue scheduled bronchodilators, Symbicort, IV steroids, flutter valve. Patient has multiple antibiotic allergies (tetracycline, Avelox, cephalosporins, PCN, levofloxacin). If patient spikes any fevers, will place on aztreonam.  2. A. Fib: -CHADS2-VASc 5. Subtherapeutic INR at admission. - Continue warfarin and metoprolol   3. NIDDM: Hyperglycemia likely due to steroids -  Obtain hemoglobin A1c, blood sugar is elevated, placed on sliding scale insulin  4. HTN:  Continue metoprolol  Discontinue lisinopril again (patient's PCP stopped this one week ago, patient is still taking at home.)  5. Hypothyroidism:  Stable.  Continue levothyroxine  6. Bladder dysfunction:  Stable.  Continue Detrol  7. Chronic diastolic CHF: EF 81-19% with grade 1 diastolic dysfunction, 2-D echo on 9/16. However patient reports significant worsening of the symptoms with weight gain, peripheral edema, orthopnea in the last 2 weeks. - Obtain 2-D echocardiogram, placed on IV Lasix - Follow strict I's and O's and daily weights  Code Status: *Full code  Family Communication: Discussed in detail with the patient, all imaging results, lab results explained to the patient    Disposition Plan:   Time Spent in minutes  25 minutes  Procedures  None   Consults   None   DVT Prophylaxis  Warfarin   Medications  Scheduled Meds: . albuterol  2.5 mg Nebulization Q4H  . budesonide-formoterol  2 puff Inhalation BID  . fesoterodine  8 mg Oral Daily  . fluticasone  2 spray Each Nare Daily  . insulin aspart  0-5 Units Subcutaneous QHS  . insulin aspart  0-9 Units  Subcutaneous TID WC  . [START ON 04/02/2015] levothyroxine  25 mcg Oral Once per day on Tue Thu  . [START ON 04/01/2015] levothyroxine  50 mcg Oral Once per day on Sun Mon Wed Fri Sat  . loratadine  10 mg Oral Daily  . methylPREDNISolone (SOLU-MEDROL) injection  60 mg Intravenous Q12H  . metoprolol tartrate  25 mg Oral BID  . montelukast  10 mg Oral QHS  . pantoprazole  80 mg Oral Q1200  . sodium chloride  2 spray Each Nare BID  . torsemide  20 mg Oral Daily  . warfarin  7.5 mg Oral ONCE-1800  . Warfarin - Pharmacist Dosing Inpatient   Does not apply q1800   Continuous Infusions:  PRN Meds:.albuterol   Antibiotics   Anti-infectives    None        Subjective:   Runette Scifres was seen and  examined today. Still short of breath and tight, wheezing, no fevers or chills. Patient denies dizziness, abdominal pain, N/V/D/C, new weakness, numbess, tingling. No acute events overnight.    Objective:   Blood pressure 151/68, pulse 65, temperature 97.5 F (36.4 C), temperature source Oral, resp. rate 18, height  (1.727 m), weight 94.33 kg (207 lb 15.4 oz), last menstrual period 12/01/2010, SpO2 90 %.  Wt Readings from Last 3 Encounters:  03/31/15 94.33 kg (207 lb 15.4 oz)  03/23/15 97.977 kg (216 lb)  02/03/15 96.163 kg (212 lb)     Intake/Output Summary (Last 24 hours) at 03/31/15 1238 Last data filed at 03/31/15 1057  Gross per 24 hour  Intake    480 ml  Output    300 ml  Net    180 ml    Exam  General: Alert and oriented x 3, NAD  HEENT:  PERRLA, EOMI, Anicteric Sclera, mucous membranes moist.   Neck: Supple, no JVD, no masses  CVS: S1 S2 auscultated, no rubs, murmurs or gallops. Regular rate and rhythm.  Respiratory: Diminished response with diffuse expiratory wheezing  Abdomen: Soft, nontender, nondistended, + bowel sounds  Ext: no cyanosis clubbing, 1-2+ edema  Neuro: AAOx3, Cr N's II- XII. Strength 5/5 upper and lower extremities bilaterally  Skin: No rashes  Psych: Normal affect and demeanor, alert and oriented x3    Data Review   Micro Results Recent Results (from the past 240 hour(s))  Culture, blood (routine x 2)     Status: None (Preliminary result)   Collection Time: 03/30/15  5:15 PM  Result Value Ref Range Status   Specimen Description BLOOD RIGHT ANTECUBITAL  Final   Special Requests BOTTLES DRAWN AEROBIC AND ANAEROBIC 5CC  Final   Culture NO GROWTH < 24 HOURS  Final   Report Status PENDING  Incomplete  Culture, blood (routine x 2)     Status: None (Preliminary result)   Collection Time: 03/30/15  5:38 PM  Result Value Ref Range Status   Specimen Description BLOOD LEFT ANTECUBITAL  Final   Special Requests BOTTLES DRAWN AEROBIC AND  ANAEROBIC 5CC  Final   Culture NO GROWTH < 24 HOURS  Final   Report Status PENDING  Incomplete    Radiology Reports Dg Chest 2 View  03/30/2015  CLINICAL DATA:  Cough, shortness of breath, fever EXAM: CHEST  2 VIEW COMPARISON:  01/20/2015 FINDINGS: The heart size and mediastinal contours are within normal limits. Both lungs are clear. The visualized skeletal structures are unremarkable. IMPRESSION: No active cardiopulmonary disease. Electronically Signed   By: Charlett Nose M.D.  On: 03/30/2015 13:39    CBC  Recent Labs Lab 03/30/15 1259 03/31/15 0543  WBC 9.6 7.8  HGB 13.5 12.1  HCT 40.8 36.8  PLT 277 248  MCV 90.5 90.4  MCH 29.9 29.7  MCHC 33.1 32.9  RDW 15.4 15.5  LYMPHSABS 0.6*  --   MONOABS 0.9  --   EOSABS 0.0  --   BASOSABS 0.0  --     Chemistries   Recent Labs Lab 03/30/15 1259 03/31/15 0543  NA 136 134*  K 3.7 3.7  CL 99* 99*  CO2 28 28  GLUCOSE 134* 205*  BUN 16 15  CREATININE 0.93 0.87  CALCIUM 9.1 8.5*  AST 29  --   ALT 20  --   ALKPHOS 81  --   BILITOT 0.6  --    ------------------------------------------------------------------------------------------------------------------ estimated creatinine clearance is 66.1 mL/min (by C-G formula based on Cr of 0.87). ------------------------------------------------------------------------------------------------------------------ No results for input(s): HGBA1C in the last 72 hours. ------------------------------------------------------------------------------------------------------------------ No results for input(s): CHOL, HDL, LDLCALC, TRIG, CHOLHDL, LDLDIRECT in the last 72 hours. ------------------------------------------------------------------------------------------------------------------ No results for input(s): TSH, T4TOTAL, T3FREE, THYROIDAB in the last 72 hours.  Invalid input(s):  FREET3 ------------------------------------------------------------------------------------------------------------------ No results for input(s): VITAMINB12, FOLATE, FERRITIN, TIBC, IRON, RETICCTPCT in the last 72 hours.  Coagulation profile  Recent Labs Lab 03/30/15 1733 03/31/15 0543  INR 1.49 1.50*    No results for input(s): DDIMER in the last 72 hours.  Cardiac Enzymes No results for input(s): CKMB, TROPONINI, MYOGLOBIN in the last 168 hours.  Invalid input(s): CK ------------------------------------------------------------------------------------------------------------------ Invalid input(s): POCBNP   Recent Labs  03/30/15 2117 03/31/15 0803 03/31/15 1127  GLUCAP 257* 157* 145*     Oday Ridings M.D. [facility] 03/31/2015, 12:38 PM  Pager: [phone] Between 7am to 7pm - call Pager - [phone]  After 7pm go to [email or web address] - password [email or web address]  Call night coverage person covering after 7pm

## 2015-03-31 NOTE — Progress Notes (Signed)
This encounter was created in error - please disregard.

## 2015-03-31 NOTE — Progress Notes (Signed)
Utilization review completed. Evalene Vath, RN, BSN. 

## 2015-03-31 NOTE — Progress Notes (Signed)
ANTICOAGULATION CONSULT NOTE  Pharmacy Consult for warfarin Indication: atrial fibrillation  Labs:  Recent Labs  03/30/15 1259 03/30/15 1733 03/31/15 0543  HGB 13.5  --  12.1  HCT 40.8  --  36.8  PLT 277  --  248  LABPROT  --  18.1* 18.1*  INR  --  1.49 1.50*  CREATININE 0.93  --  0.87    Estimated Creatinine Clearance: 66.1 mL/min (by C-G formula based on Cr of 0.87).   Assessment: 74 year old female on chronic coumadin for atrial fibrillation. INR on admission is sub-therapeutic at 1.49 on this admission. Patient does state she has missed doses.  INR still sub-therapeutic  Goal of Therapy:  INR 2-3 Monitor platelets by anticoagulation protocol: Yes   Plan:  Warfarin 7.5mg  tonight  PT/INR daily while on therapy.   Thank you Okey RegalLisa Erek Kowal, PharmD [phone]   03/31/2015,9:28 AM

## 2015-04-01 ENCOUNTER — Inpatient Hospital Stay (HOSPITAL_COMMUNITY): Payer: Medicare Other

## 2015-04-01 DIAGNOSIS — R0600 Dyspnea, unspecified: Secondary | ICD-10-CM

## 2015-04-01 LAB — PROTIME-INR
INR: 1.93 — ABNORMAL HIGH (ref 0.00–1.49)
Prothrombin Time: 21.9 seconds — ABNORMAL HIGH (ref 11.6–15.2)

## 2015-04-01 LAB — GLUCOSE, CAPILLARY
GLUCOSE-CAPILLARY: 171 mg/dL — AB (ref 65–99)
Glucose-Capillary: 205 mg/dL — ABNORMAL HIGH (ref 65–99)
Glucose-Capillary: 171 mg/dL — ABNORMAL HIGH (ref 65–99)
Glucose-Capillary: 160 mg/dL — ABNORMAL HIGH (ref 65–99)

## 2015-04-01 LAB — BASIC METABOLIC PANEL
Anion gap: 9 (ref 5–15)
BUN: 28 mg/dL — AB (ref 6–20)
CALCIUM: 8.5 mg/dL — AB (ref 8.9–10.3)
CHLORIDE: 98 mmol/L — AB (ref 101–111)
CO2: 28 mmol/L (ref 22–32)
CREATININE: 1.1 mg/dL — AB (ref 0.44–1.00)
GFR calc non Af Amer: 48 mL/min — ABNORMAL LOW (ref >60)
GFR, EST AFRICAN AMERICAN: 55 mL/min — AB (ref >60)
Glucose, Bld: 173 mg/dL — ABNORMAL HIGH (ref 65–99)
Potassium: 3.4 mmol/L — ABNORMAL LOW (ref 3.5–5.1)
SODIUM: 135 mmol/L (ref 135–145)

## 2015-04-01 LAB — CBC
HCT: 35.8 % — ABNORMAL LOW (ref 36.0–46.0)
Hemoglobin: 11.7 g/dL — ABNORMAL LOW (ref 12.0–15.0)
MCH: 29 pg (ref 26.0–34.0)
MCHC: 32.7 g/dL (ref 30.0–36.0)
MCV: 88.8 fL (ref 78.0–100.0)
PLATELETS: 269 10*3/uL (ref 150–400)
RBC: 4.03 MIL/uL (ref 3.87–5.11)
RDW: 15.1 % (ref 11.5–15.5)
WBC: 14.7 10*3/uL — AB (ref 4.0–10.5)

## 2015-04-01 LAB — PROCALCITONIN: Procalcitonin: <0.1 ng/mL

## 2015-04-01 MED ORDER — WARFARIN SODIUM 7.5 MG PO TABS
7.5000 mg | ORAL_TABLET | Freq: Once | ORAL | Status: AC
  Administered 2015-04-01: 7.5 mg via ORAL
  Filled 2015-04-01: qty 1

## 2015-04-01 NOTE — Progress Notes (Signed)
[facility]                                                                              Patient Demographics  Robertine Kipper, is a 74 y.o. female, DOB - 1940/05/29, ONG:[id number]  Admit date - 03/30/2015   Admitting Physician Alberteen Sam, MD  Outpatient Primary MD for the patient is Sanda Linger, MD  LOS - 2   Chief Complaint  Patient presents with  . Cough       Brief HPI   MAKALEY STORTS is a 74 y.o. female with a past medical history significant for pAF on warfarin, NIDDM, HTN, hypothyroidism, and chronic diastolic CHF who presented with cough and dyspnea. The patient's husband passed away one week ago. Since that time she has had increasing dyspnea, productive cough, and wheezing. Today her son thought that she looked like she was having difficulty breathing and so he brought her to the ER. In the ED, the patient was febrile to 102.37F, diffusely wheezy, and hypoxic but improved with nebulized bronchodilators. The chest x-ray was clear. TRH were asked to admit for hypoxic respiratory failure. Of note, the patient gained 15 pounds and had 3+ pitting edema, and was started on torsemide 1 week ago by her PCP. Since then her edema has gotten much better.   Assessment & Plan    Principal Problem:  Acute Hypoxic respiratory failure: Likely due to asthma/COPD exacerbation and possibly acute on chronic diastolic CHF exacerbation- overall improving - old PFTs showed reversible obstructive defect. Pro-calcitonin less than 0.1, lactic acid normal, BNP was slightly elevated at 290.  - Continue scheduled bronchodilators, Symbicort, IV steroids, flutter valve. Patient has multiple antibiotic allergies (tetracycline, Avelox, cephalosporins, PCN, levofloxacin). If patient spikes any fevers, will place on aztreonam. - Will transition to oral prednisone in a.m.,  Active problems Acute on Chronic diastolic CHF: EF 44-01% with grade 1 diastolic  dysfunction, 2-D echo on 9/16. However patient reports significant worsening of the symptoms with weight gain, peripheral edema, orthopnea in the last 2 weeks. - 2-D echo still pending, continue IV Lasix - Follow strict I's and O's, weights down from 207 to 200lbs   A. Fib: -CHADS2-VASc 5. Subtherapeutic INR at admission. - Continue warfarin and metoprolol    NIDDM: Hyperglycemia likely due to steroids - Obtain hemoglobin A1c, blood sugar is elevated, placed on sliding scale insulin  HTN:  Continue metoprolol  Discontinue lisinopril again (patient's PCP stopped this one week ago, patient is still taking at home.)   Hypothyroidism:  Stable.  Continue levothyroxine   Bladder dysfunction:  Stable.  Continue Detrol  Code Status: *Full code  Family Communication: Discussed in detail with the patient, all imaging results, lab results explained to the patient    Disposition Plan: Hopefully DC home in a.m.  Time Spent in minutes  25 minutes  Procedures  None   Consults   None   DVT Prophylaxis  Warfarin   Medications  Scheduled Meds: . albuterol  2.5 mg Nebulization TID  . budesonide-formoterol  2 puff Inhalation BID  . fesoterodine  8 mg Oral Daily  . fluticasone  2 spray  Each Nare Daily  . furosemide  40 mg Intravenous Daily  . insulin aspart  0-5 Units Subcutaneous QHS  . insulin aspart  0-9 Units Subcutaneous TID WC  . [START ON 04/02/2015] levothyroxine  25 mcg Oral Once per day on Tue Thu  . levothyroxine  50 mcg Oral Once per day on Sun Mon Wed Fri Sat  . loratadine  10 mg Oral Daily  . methylPREDNISolone (SOLU-MEDROL) injection  60 mg Intravenous Q12H  . metoprolol tartrate  25 mg Oral BID  . montelukast  10 mg Oral QHS  . pantoprazole  80 mg Oral Q1200  . sodium chloride  2 spray Each Nare BID  . torsemide  20 mg Oral Daily  . warfarin  7.5 mg Oral ONCE-1800  . Warfarin - Pharmacist Dosing Inpatient   Does not apply q1800   Continuous Infusions:    PRN Meds:.albuterol   Antibiotics   Anti-infectives    None        Subjective:   Erma PintoMargaret Lozier-Arps was seen and examined today. Overall improving, shortness of breath improving, no chest pain, peripheral edema and improving. Still has slight wheezing. No abdominal pain, N/V/D/C, new weakness, numbess, tingling. No acute events overnight.   no fevers or chills.  Objective:   Blood pressure 142/64, pulse 60, temperature 98 F (36.7 C), temperature source Oral, resp. rate 20, height 5\' 8"  (1.727 m), weight 90.901 kg (200 lb 6.4 oz), last menstrual period 12/01/2010, SpO2 97 %.  Wt Readings from Last 3 Encounters:  04/01/15 90.901 kg (200 lb 6.4 oz)  03/23/15 97.977 kg (216 lb)  02/03/15 96.163 kg (212 lb)     Intake/Output Summary (Last 24 hours) at 04/01/15 1209 Last data filed at 04/01/15 1038  Gross per 24 hour  Intake    540 ml  Output      0 ml  Net    540 ml    Exam  General: Alert and oriented x 3, NAD  HEENT:  PERRLA, EOMI, Anicteric Sclera, mucous membranes moist.   Neck: Supple, no JVD, no masses  CVS: S1 S2clear, RRR  Respiratory: Faint expiratory wheezing   Abdomen: Soft, nontender, nondistended, + bowel sounds  Ext: no cyanosis clubbing, 1-2+ edema  Neuro: no new deficits   Skin: No rashes  Psych: Normal affect and demeanor, alert and oriented x3    Data Review   Micro Results Recent Results (from the past 240 hour(s))  Culture, blood (routine x 2)     Status: None (Preliminary result)   Collection Time: 03/30/15  5:15 PM  Result Value Ref Range Status   Specimen Description BLOOD RIGHT ANTECUBITAL  Final   Special Requests BOTTLES DRAWN AEROBIC AND ANAEROBIC 5CC  Final   Culture NO GROWTH < 24 HOURS  Final   Report Status PENDING  Incomplete  Culture, blood (routine x 2)     Status: None (Preliminary result)   Collection Time: 03/30/15  5:38 PM  Result Value Ref Range Status   Specimen Description BLOOD LEFT ANTECUBITAL  Final    Special Requests BOTTLES DRAWN AEROBIC AND ANAEROBIC 5CC  Final   Culture NO GROWTH < 24 HOURS  Final   Report Status PENDING  Incomplete    Radiology Reports Dg Chest 2 View  03/30/2015  CLINICAL DATA:  Cough, shortness of breath, fever EXAM: CHEST  2 VIEW COMPARISON:  01/20/2015 FINDINGS: The heart size and mediastinal contours are within normal limits. Both lungs are clear. The visualized skeletal structures are  unremarkable. IMPRESSION: No active cardiopulmonary disease. Electronically Signed   By: Charlett Nose M.D.   On: 03/30/2015 13:39    CBC  Recent Labs Lab 03/30/15 1259 03/31/15 0543 04/01/15 0504  WBC 9.6 7.8 14.7*  HGB 13.5 12.1 11.7*  HCT 40.8 36.8 35.8*  PLT 277 248 269  MCV 90.5 90.4 88.8  MCH 29.9 29.7 29.0  MCHC 33.1 32.9 32.7  RDW 15.4 15.5 15.1  LYMPHSABS 0.6*  --   --   MONOABS 0.9  --   --   EOSABS 0.0  --   --   BASOSABS 0.0  --   --     Chemistries   Recent Labs Lab 03/30/15 1259 03/31/15 0543 04/01/15 0504  NA 136 134* 135  K 3.7 3.7 3.4*  CL 99* 99* 98*  CO2 GLUCOSE 134* 205* 173*  BUN 16 15 28*  CREATININE 0.93 0.87 1.10*  CALCIUM 9.1 8.5* 8.5*  AST 29  --   --   ALT 20  --   --   ALKPHOS 81  --   --   BILITOT 0.6  --   --    ------------------------------------------------------------------------------------------------------------------ estimated creatinine clearance is 51.3 mL/min (by C-G formula based on Cr of 1.1). ------------------------------------------------------------------------------------------------------------------ No results for input(s): HGBA1C in the last 72 hours. ------------------------------------------------------------------------------------------------------------------ No results for input(s): CHOL, HDL, LDLCALC, TRIG, CHOLHDL, LDLDIRECT in the last 72 hours. ------------------------------------------------------------------------------------------------------------------ No results for  input(s): TSH, T4TOTAL, T3FREE, THYROIDAB in the last 72 hours.  Invalid input(s): FREET3 ------------------------------------------------------------------------------------------------------------------ No results for input(s): VITAMINB12, FOLATE, FERRITIN, TIBC, IRON, RETICCTPCT in the last 72 hours.  Coagulation profile  Recent Labs Lab 03/30/15 1733 03/31/15 0543 04/01/15 0504  INR 1.49 1.50* 1.93*    No results for input(s): DDIMER in the last 72 hours.  Cardiac Enzymes No results for input(s): CKMB, TROPONINI, MYOGLOBIN in the last 168 hours.  Invalid input(s): CK ------------------------------------------------------------------------------------------------------------------ Invalid input(s): POCBNP   Recent Labs  03/31/15 0803 03/31/15 1127 03/31/15 1651 03/31/15 2137 04/01/15 0808 04/01/15 1156  GLUCAP 157* 145* 175* 204* 171* 205*     Tanaiya Kolarik M.D. [facility] 04/01/2015, 12:09 PM  Pager: [phone] Between 7am to 7pm - call Pager - [phone]  After 7pm go to [email or web address] - password [email or web address]  Call night coverage person covering after 7pm

## 2015-04-01 NOTE — Consult Note (Signed)
   [facility]   04/01/2015  [name] 1940/05/17 [id number] [facility] for chronic disease management services.  Patient has been engaged by a [employer] Community Care Coordinator.  Went by to see patient and she was having a procedure.   Our community based plan of care has focused on disease management and community resource support.  Patient will receive a post discharge transition of care call and will be evaluated for monthly home visits for assessments and disease process education.  Made Inpatient Case Manager aware that [facility] following.  Inpatient RNCM states she will set up Home Health care, as well. Of note, [facility] services does not replace or interfere with any services that are needed or arranged by inpatient case management or social work.  For additional questions or referrals please contact: Charlesetta ShanksVictoria Emerly Prak, RN BSN CCM [facility]  [phone] business mobile phone

## 2015-04-01 NOTE — Progress Notes (Signed)
Echocardiogram 2D Echocardiogram has been performed.  [name] [name], [name] [name] 04/01/2015, 1:58 PM

## 2015-04-01 NOTE — Progress Notes (Signed)
ANTICOAGULATION CONSULT NOTE  Pharmacy Consult for warfarin Indication: atrial fibrillation  Labs:  Recent Labs  03/30/15 1259 03/30/15 1733 03/31/15 0543 04/01/15 0504  HGB 13.5  --  12.1 11.7*  HCT 40.8  --  36.8 35.8*  PLT 277  --  248 269  LABPROT  --  18.1* 18.1* 21.9*  INR  --  1.49 1.50* 1.93*  CREATININE 0.93  --  0.87 1.10*    Estimated Creatinine Clearance: 51.3 mL/min (by C-G formula based on Cr of 1.1).   Assessment: 74 year old female on chronic coumadin for atrial fibrillation. INR on admission is sub-therapeutic at 1.49 on this admission. Patient does state she has missed doses.  INR trending up  Goal of Therapy:  INR 2-3 Monitor platelets by anticoagulation protocol: Yes   Plan:  Warfarin 7.5mg  tonight  PT/INR daily while on therapy.   Thank you Okey RegalLisa Gracianna Vink, PharmD [phone]   04/01/2015,8:57 AM

## 2015-04-02 ENCOUNTER — Telehealth: Payer: Self-pay | Admitting: Internal Medicine

## 2015-04-02 LAB — CBC
HEMATOCRIT: 37.1 % (ref 36.0–46.0)
HEMOGLOBIN: 12.2 g/dL (ref 12.0–15.0)
MCH: 29.1 pg (ref 26.0–34.0)
MCHC: 32.9 g/dL (ref 30.0–36.0)
MCV: 88.5 fL (ref 78.0–100.0)
Platelets: 318 10*3/uL (ref 150–400)
RBC: 4.19 MIL/uL (ref 3.87–5.11)
RDW: 15.2 % (ref 11.5–15.5)
WBC: 12.9 10*3/uL — ABNORMAL HIGH (ref 4.0–10.5)

## 2015-04-02 LAB — BASIC METABOLIC PANEL
ANION GAP: 10 (ref 5–15)
BUN: 36 mg/dL — AB (ref 6–20)
CHLORIDE: 96 mmol/L — AB (ref 101–111)
CO2: 30 mmol/L (ref 22–32)
Calcium: 8.6 mg/dL — ABNORMAL LOW (ref 8.9–10.3)
Creatinine, Ser: 1.21 mg/dL — ABNORMAL HIGH (ref 0.44–1.00)
GFR calc Af Amer: 49 mL/min — ABNORMAL LOW (ref >60)
GFR, EST NON AFRICAN AMERICAN: 42 mL/min — AB (ref >60)
GLUCOSE: 166 mg/dL — AB (ref 65–99)
POTASSIUM: 3.6 mmol/L (ref 3.5–5.1)
Sodium: 136 mmol/L (ref 135–145)

## 2015-04-02 LAB — PROTIME-INR
INR: 2.1 — AB (ref 0.00–1.49)
Prothrombin Time: 23.4 seconds — ABNORMAL HIGH (ref 11.6–15.2)

## 2015-04-02 LAB — GLUCOSE, CAPILLARY
Glucose-Capillary: 173 mg/dL — ABNORMAL HIGH (ref 65–99)
Glucose-Capillary: 155 mg/dL — ABNORMAL HIGH (ref 65–99)

## 2015-04-02 MED ORDER — WARFARIN SODIUM 5 MG PO TABS: 5.0000 mg | ORAL_TABLET | ORAL | Status: DC

## 2015-04-02 MED ORDER — BENZONATATE 100 MG PO CAPS: 100.0000 mg | ORAL_CAPSULE | Freq: Three times a day (TID) | ORAL | Status: DC | PRN

## 2015-04-02 MED ORDER — GUAIFENESIN-DM 100-10 MG/5ML PO SYRP: 5.0000 mL | ORAL_SOLUTION | ORAL | Status: DC | PRN

## 2015-04-02 MED ORDER — GUAIFENESIN ER 600 MG PO TB12: 600.0000 mg | ORAL_TABLET | Freq: Two times a day (BID) | ORAL | Status: DC

## 2015-04-02 MED ORDER — PREDNISONE 20 MG PO TABS
40.0000 mg | ORAL_TABLET | Freq: Every day | ORAL | Status: DC
  Administered 2015-04-02: 40 mg via ORAL
  Filled 2015-04-02: qty 2

## 2015-04-02 MED ORDER — PREDNISONE 10 MG PO TABS: ORAL_TABLET | ORAL | Status: DC

## 2015-04-02 MED ORDER — WARFARIN SODIUM 7.5 MG PO TABS: 7.5000 mg | ORAL_TABLET | ORAL | Status: DC

## 2015-04-02 MED ORDER — BENZONATATE 100 MG PO CAPS: 100.0000 mg | ORAL_CAPSULE | Freq: Three times a day (TID) | ORAL | Status: DC

## 2015-04-02 NOTE — Progress Notes (Signed)
Reviewed discharge instructions with pt.  PIV removed.  Prescriptions given.  Awaiting for ride. Will continue to monitor.

## 2015-04-02 NOTE — Care Management Important Message (Signed)
Important Message  Patient Details  Name: [name] CarryMargaret M Lozier-Arps MRN: [id number] Date of Birth: November 21, 1940   Medicare Important Message Given:  Yes    Kyla BalzarineShealy, Keyla Milone Abena 04/02/2015, 1:59 PM

## 2015-04-02 NOTE — Discharge Summary (Signed)
Physician Discharge Summary   Patient ID: [name] [name] MRN: [id number] DOB/AGE: [birth year] 74 y.o.  Admit date: 03/30/2015 Discharge date: 04/02/2015  Primary Care Physician:  Sanda Linger, MD  Discharge Diagnoses:    Acute hypoxic respiratory failure due to COPD exacerbation  . Hypothyroidism . Essential hypertension . Campath-induced atrial fibrillation . Type II diabetes mellitus with manifestations (HCC) . OAB (overactive bladder) . Asthma exacerbation . mild acute on chronic Diastolic dysfunction  Consults:  None  Recommendations for Outpatient Follow-up:  1. Please repeat CBC/BMET at next visit    TESTS THAT NEED FOLLOW-UP PT/INR   DIET: Carb modified diet    Allergies:   Allergies  Allergen Reactions  . Tetracycline Swelling    Swollen tongue  . Acetaminophen Other (See Comments)    ASTHMA MORE SEVERE  . Avelox [Moxifloxacin Hcl In Nacl] Hives, Itching and Rash  . Cardizem [Diltiazem Hcl] Swelling    edema  . Cefaclor Hives, Itching and Rash  . Cephalexin Hives, Itching and Rash  . Clindamycin Hives, Itching and Rash  . Crestor [Rosuvastatin] Other (See Comments)    Muscle aches  . Ibuprofen Other (See Comments)    MAKES ASTHMA WORSE  . Nsaids Other (See Comments)    Respiratory-Asthma WORSE  . Penicillins Hives  . Pravastatin Sodium Other (See Comments)    MYALGIAS  . Simvastatin Other (See Comments)    MYALGIAS  . Sulfonamide Derivatives Hives, Itching and Rash  . Levaquin [Levofloxacin In D5w] Rash  . Lisinopril Cough     DISCHARGE MEDICATIONS: Current Discharge Medication List    START taking these medications   Details  benzonatate (TESSALON) 100 MG capsule Take 1 capsule (100 mg total) by mouth 3 (three) times daily as needed for cough. Qty: 30 capsule, Refills: 0    guaiFENesin (MUCINEX) 600 MG 12 hr tablet Take 1 tablet (600 mg total) by mouth 2 (two) times daily. Qty: 30 tablet, Refills: 0     guaiFENesin-dextromethorphan (ROBITUSSIN DM) 100-10 MG/5ML syrup Take 5 mLs by mouth every 4 (four) hours as needed for cough. Qty: 118 mL, Refills: 0    predniSONE (DELTASONE) 10 MG tablet Prednisone dosing: Take  Prednisone 40mg  (4 tabs) x 2 days, then taper to 30mg  (3 tabs) x 3 days, then 20mg  (2 tabs) x 3days, then 10mg  (1 tab) x 3days, then OFF. Qty: 26 tablet, Refills: 0      CONTINUE these medications which have NOT CHANGED   Details  albuterol (PROAIR HFA) 108 (90 BASE) MCG/ACT inhaler Inhale 2 puffs into the lungs every 6 (six) hours as needed for wheezing or shortness of breath. Qty: 8.5 each, Refills: 11   Associated Diagnoses: Asthma, mild intermittent, uncomplicated    budesonide-formoterol (SYMBICORT) 160-4.5 MCG/ACT inhaler Inhale 2 puffs into the lungs 2 (two) times daily. Qty: 1 Inhaler, Refills: 1    esomeprazole (NEXIUM) 40 MG capsule Take 1 capsule (40 mg total) by mouth 2 (two) times daily before a meal. Qty: 180 capsule, Refills: 3   Associated Diagnoses: Gastroesophageal reflux disease with esophagitis    fluticasone (FLONASE) 50 MCG/ACT nasal spray Place 2 sprays into both nostrils daily. Qty: 48 g, Refills: 3   Associated Diagnoses: Non-seasonal allergic rhinitis due to pollen    levothyroxine (SYNTHROID) 50 MCG tablet 1 before breakfast EXCEPT 1/2 on Tues & Thurs Qty: 90 tablet, Refills: 1   Associated Diagnoses: Other specified hypothyroidism    loratadine (CLARITIN) 10 MG tablet Take 10 mg by mouth daily.  metoprolol tartrate (LOPRESSOR) 25 MG tablet Take 1 tablet (25 mg total) by mouth 2 (two) times daily. Qty: 60 tablet, Refills: 1    montelukast (SINGULAIR) 10 MG tablet Take 1 tablet (10 mg total) by mouth at bedtime. Qty: 90 tablet, Refills: 3   Associated Diagnoses: Asthma, mild intermittent, uncomplicated    sodium chloride (OCEAN) 0.65 % SOLN nasal spray Place 2 sprays into both nostrils 4 (four) times daily. Qty: 90 mL, Refills: 0     tolterodine (DETROL LA) 4 MG 24 hr capsule Take 1 capsule (4 mg total) by mouth daily. Qty: 90 capsule, Refills: 3   Associated Diagnoses: OAB (overactive bladder)    torsemide (DEMADEX) 20 MG tablet Take 1 tablet (20 mg total) by mouth daily. Qty: 90 tablet, Refills: 1   Associated Diagnoses: Diastolic dysfunction; Localized edema    warfarin (COUMADIN) 5 MG tablet Take 1 tab (5 mg) on Mon/Friday and 1 1/2 tabs (7.5 mg) tablets on Sun/Tues/Wed/Thurs/Sat. Qty: 40 tablet, Refills: 3   Associated Diagnoses: Persistent atrial fibrillation (HCC)      STOP taking these medications     lisinopril (PRINIVIL,ZESTRIL) 20 MG tablet          Brief H and P: For complete details please refer to admission H and P, but in brief [name] [name] is a 74 y.o. female with a past medical history significant for pAF on warfarin, NIDDM, HTN, hypothyroidism, and chronic diastolic CHF who presented with cough and dyspnea. The patient's husband passed away one week ago. Since that time she has had increasing dyspnea, productive cough, and wheezing. Today her son thought that she looked like she was having difficulty breathing and so he brought her to the ER. In the ED, the patient was febrile to 102.61F, diffusely wheezy, and hypoxic but improved with nebulized bronchodilators. The chest x-ray was clear. TRH were asked to admit for hypoxic respiratory failure. Of note, the patient gained 15 pounds and had 3+ pitting edema, and was started on torsemide 1 week ago by her PCP. Since then her edema has gotten much better.  Hospital Course:   Acute Hypoxic respiratory failure: Likely due to asthma/COPD exacerbation and possibly acute on chronic diastolic CHF exacerbation- overall significantly improved. - old PFTs showed reversible obstructive defect. Pro-calcitonin less than 0.1, lactic acid normal, BNP was slightly elevated at 290.  - Patient was placed on scheduled bronchodilators, Symbicort, IV  steroids, flutter valve.  - She was transitioned to oral prednisone, she requested to continue albuterol inhaler with Symbicort. She does not feel quite comfortable with nebulizers.   Acute on Chronic diastolic CHF: EF 16-10% with grade 1 diastolic dysfunction, 2-D echo on 9/16. However patient reports significant worsening of the symptoms with weight gain, peripheral edema, orthopnea in the last 2 weeks. - 2-D echo showed EF of 65-70% with grade 1 diastolic dysfunction. Patient was placed on IV Lasix. weight down from 207 to 200lbs. I recommended the patient to continue outpatient dose of torsemide.  A. Fib: -CHADS2-VASc 5. Subtherapeutic INR at admission. INR 2.1 at the time of discharge. - Continue warfarin and metoprolol   NIDDM: Hyperglycemia likely due to steroids - Patient recommended continue diet control, she has a follow-up appointment with PCP, may need to be  oral hypoglycemic medicine except needed at the follow-up appointment  HTN:  Continue metoprolol  Discontinue lisinopril again (patient's PCP stopped this one week ago, patient is still taking at home.)  Hypothyroidism:  Stable.  Continue levothyroxine  Bladder  dysfunction:  Stable.  Continue Detrol  Generalized debility Physical therapy evaluation was done and patient states she is ambulatory without difficulty and no longer needs physical therapy   Day of Discharge BP 160/66 mmHg  Pulse 62  Temp(Src) 98.2 F (36.8 C) (Oral)  Resp 18  Ht  (1.727 m)  Wt 91 kg (200 lb 9.9 oz)  BMI 30.51 kg/m2  SpO2 93%  LMP 12/01/2010  Physical Exam: General: Alert and awake oriented x3 not in any acute distress. HEENT: anicteric sclera, pupils reactive to light and accommodation CVS: S1-S2 clear no murmur rubs or gallops Chest: clear to auscultation bilaterally, no wheezing rales or rhonchi Abdomen: soft nontender, nondistended, normal bowel sounds Extremities: no cyanosis, clubbing or edema noted  bilaterally Neuro: Cranial nerves II-XII intact, no focal neurological deficits   The results of significant diagnostics from this hospitalization (including imaging, microbiology, ancillary and laboratory) are listed below for reference.    LAB RESULTS: Basic Metabolic Panel:  Recent Labs Lab 04/01/15 0504 04/02/15 0658  NA 135 136  K 3.4* 3.6  CL 98* 96*  CO2 28 30  GLUCOSE 173* 166*  BUN 28* 36*  CREATININE 1.10* 1.21*  CALCIUM 8.5* 8.6*   Liver Function Tests:  Recent Labs Lab 03/30/15 1259  AST 29  ALT 20  ALKPHOS 81  BILITOT 0.6  PROT 7.2  ALBUMIN 3.6   No results for input(s): LIPASE, AMYLASE in the last 168 hours. No results for input(s): AMMONIA in the last 168 hours. CBC:  Recent Labs Lab 03/30/15 1259  04/01/15 0504 04/02/15 0658  WBC 9.6  < > 14.7* 12.9*  NEUTROABS 8.0*  --   --   --   HGB 13.5  < > 11.7* 12.2  HCT 40.8  < > 35.8* 37.1  MCV 90.5  < > 88.8 88.5  PLT 277  < > 269 318  < > = values in this interval not displayed. Cardiac Enzymes: No results for input(s): CKTOTAL, CKMB, CKMBINDEX, TROPONINI in the last 168 hours. BNP: Invalid input(s): POCBNP CBG:  Recent Labs Lab 04/02/15 0755 04/02/15 1156  GLUCAP 173* 155*    Significant Diagnostic Studies:  Dg Chest 2 View  03/30/2015  CLINICAL DATA:  Cough, shortness of breath, fever EXAM: CHEST  2 VIEW COMPARISON:  01/20/2015 FINDINGS: The heart size and mediastinal contours are within normal limits. Both lungs are clear. The visualized skeletal structures are unremarkable. IMPRESSION: No active cardiopulmonary disease. Electronically Signed   By: Charlett Nose M.D.   On: 03/30/2015 13:39    2D ECHO: Study Conclusions  - Left ventricle: The cavity size was normal. Systolic function was vigorous. The estimated ejection fraction was in the range of 65% to 70%. There was no dynamic obstruction. Wall motion was normal; there were no regional wall motion abnormalities.  Doppler parameters are consistent with abnormal left ventricular relaxation (grade 1 diastolic dysfunction). - Aortic valve: Trileaflet; mildly thickened, mildly calcified leaflets. There was mild regurgitation. - Mitral valve: Calcified annulus.  Impressions:  - There was no dynamic obstruction when compared to prior echocardiogram.  Disposition and Follow-up: Discharge Instructions    Diet - low sodium heart healthy    Complete by:  As directed      Increase activity slowly    Complete by:  As directed             DISPOSITION: home    DISCHARGE FOLLOW-UP Follow-up Information    Follow up with Sanda Linger, MD. Nyra Capes  on 04/09/2015.   Specialty:  Internal Medicine   Why:  for hospital follow-up/// Appointment with Dr. Yetta BarreJones is 04/09/15 at 10:30am   Contact information:   [address] [place] [id number] [phone]        Time spent on Discharge: 40 minutes  Signed:   Takyla Kuchera M.D. [facility] 04/02/2015, 12:12 PM Pager: [phone]

## 2015-04-02 NOTE — Telephone Encounter (Signed)
Pt request to speak to the assistant concern about medication that she need to be send to her mail order. Pt stated she has about 10 written RX for the medications but she does not know the process of how to get this send to mail order. Please call her back

## 2015-04-02 NOTE — Progress Notes (Signed)
ANTICOAGULATION CONSULT NOTE  Pharmacy Consult for Warfarin Indication: atrial fibrillation  Labs:  Recent Labs  03/31/15 0543 04/01/15 0504 04/02/15 0658  HGB 12.1 11.7* 12.2  HCT 36.8 35.8* 37.1  PLT 248 269 318  LABPROT 18.1* 21.9* 23.4*  INR 1.50* 1.93* 2.10*  CREATININE 0.87 1.10* 1.21*    Estimated Creatinine Clearance: 46.6 mL/min (by C-G formula based on Cr of 1.21).   Assessment: 74 year old female on chronic coumadin for atrial fibrillation. INR on admission is sub-therapeutic at 1.49 on this admission.  Patient does state she has missed doses.  INR = 2.10  Goal of Therapy:  INR 2-3 Monitor platelets by anticoagulation protocol: Yes   Plan:  Resume prior to admission dose - > 7.5 mg daily except 5 mg Monday and Friday PT/INR daily while on therapy.   Thank you Okey RegalLisa Keng Jewel, PharmD [phone]   04/02/2015,9:24 AM

## 2015-04-02 NOTE — Care Management Note (Addendum)
Case Management Note  Patient Details  Name: [name] [name] MRN: [id number] Date of Birth: 04-08-1941  Subjective/Objective:    Date: 04/02/15 Spoke with patient at the bedside.  Introduced self as Sports coachcase manager and explained role in discharge planning and how to be reached.  Verified patient lives in town, alone but her daughter will be her on Sunday or Monday to come stay with her for a week and her son is in/out all the time at her home. Has DME cane , rolling walker and a shower chair.  Expressed potential need for no other DME.  Verified patient anticipates to go  home alone, at time of discharge and will have parrt-time supervision by family at this time to best of their knowledge. Patient denied needing help with their medication.  Patient will be driven by her daughter or son to MD appointments.  Verified patient has PCP Sanda Lingerhomas Jones.  [facility].  [facility] made to [facility]  Soc will begin 24-48 hrs post dc. Patient states that her son is her point of contact Edward [phone].  Plan: CM will continue to follow for discharge planning and [facility].                 Action/Plan:   Expected Discharge Date:                  Expected Discharge Plan:  Home w Home Health Services  In-House Referral:     Discharge planning Services  CM Consult  Post Acute Care Choice:  Home Health Choice offered to:  Patient  DME Arranged:    DME Agency:     HH Arranged:  RN, Nurse's Aide, PT, OT HH Agency:  [facility]  Status of Service:  Completed, signed off  Medicare Important Message Given:    Date Medicare IM Given:    Medicare IM give by:    Date Additional Medicare IM Given:    Additional Medicare Important Message give by:     If discussed at Long Length of Stay Meetings, dates discussed:    Additional Comments:  [name] [name], [name] Gagen Clinton, RN 04/02/2015, 12:26 PM

## 2015-04-02 NOTE — Progress Notes (Signed)
Physical Therapy Discharge Patient Details Name: [name] CarryMargaret M Lozier-Arps MRN: [id number] DOB: 1940-05-09 Today's Date: 04/02/2015 Time:  -     Patient discharged from PT services secondary to Pt states she is now amb without difficulty and no longer needs PT.Marland Kitchen.  Please see latest therapy progress note for current level of functioning and progress toward goals.    Progress and discharge plan discussed with patient and/or caregiver: Patient/Caregiver agrees with plan  GP     [facility] LLC[facility]YCOCK,Jode Lippe 04/02/2015, 11:41 AM  Skip Mayerary Bernetha Anschutz PT [phone]

## 2015-04-03 ENCOUNTER — Telehealth: Payer: Self-pay | Admitting: *Deleted

## 2015-04-03 DIAGNOSIS — E039 Hypothyroidism, unspecified: Secondary | ICD-10-CM | POA: Diagnosis not present

## 2015-04-03 DIAGNOSIS — I10 Essential (primary) hypertension: Secondary | ICD-10-CM | POA: Diagnosis not present

## 2015-04-03 DIAGNOSIS — J45901 Unspecified asthma with (acute) exacerbation: Secondary | ICD-10-CM | POA: Diagnosis not present

## 2015-04-03 DIAGNOSIS — I480 Paroxysmal atrial fibrillation: Secondary | ICD-10-CM | POA: Diagnosis not present

## 2015-04-03 DIAGNOSIS — E119 Type 2 diabetes mellitus without complications: Secondary | ICD-10-CM | POA: Diagnosis not present

## 2015-04-03 DIAGNOSIS — Z7901 Long term (current) use of anticoagulants: Secondary | ICD-10-CM | POA: Diagnosis not present

## 2015-04-03 DIAGNOSIS — M150 Primary generalized (osteo)arthritis: Secondary | ICD-10-CM | POA: Diagnosis not present

## 2015-04-03 DIAGNOSIS — E669 Obesity, unspecified: Secondary | ICD-10-CM | POA: Diagnosis not present

## 2015-04-03 DIAGNOSIS — J441 Chronic obstructive pulmonary disease with (acute) exacerbation: Secondary | ICD-10-CM | POA: Diagnosis not present

## 2015-04-03 DIAGNOSIS — F329 Major depressive disorder, single episode, unspecified: Secondary | ICD-10-CM | POA: Diagnosis not present

## 2015-04-03 DIAGNOSIS — I5033 Acute on chronic diastolic (congestive) heart failure: Secondary | ICD-10-CM | POA: Diagnosis not present

## 2015-04-03 DIAGNOSIS — K219 Gastro-esophageal reflux disease without esophagitis: Secondary | ICD-10-CM | POA: Diagnosis not present

## 2015-04-03 DIAGNOSIS — Z5181 Encounter for therapeutic drug level monitoring: Secondary | ICD-10-CM | POA: Diagnosis not present

## 2015-04-03 LAB — HEMOGLOBIN A1C
Hgb A1c MFr Bld: 6.2 % — ABNORMAL HIGH (ref 4.8–5.6)
MEAN PLASMA GLUCOSE: 131 mg/dL

## 2015-04-03 NOTE — Telephone Encounter (Signed)
Transition Care Management Follow-up Telephone Call   Date discharged? 04/02/15   How have you been since you were released from the hospital? Called pt spoke with son Ramon Dredge(Edward) mom was in the shower, he stated that she was doing better   Do you understand why you were in the hospital? YES   Do you understand the discharge instructions? YES   Where were you discharged to? Home   Items Reviewed:  Medications reviewed: YES, inform son per summary pt is to stop taking the Lisinopril until she see md on 12/15.  Allergies reviewed: YES  Dietary changes reviewed: YES  Referrals reviewed: YES   Functional Questionnaire:   Activities of Daily Living (ADLs):   He states she are independent in the following: ambulation, bathing and hygiene, feeding, continence, grooming, toileting and dressing States she doesn't  require assistance    Any transportation issues/concerns?: NO   Any patient concerns? NO   Confirmed importance and date/time of follow-up visits scheduled YES appt 04/09/15  Provider Appointment booked with Dr. Yetta BarreJones  Confirmed with patient if condition begins to worsen call PCP or go to the ER.  Patient was given the office number and encouraged to call back with question or concerns.  : YES

## 2015-04-03 NOTE — Telephone Encounter (Signed)
Returning pt call to see which pharmacy she wants her meds sent to. Adivsed she will call back pt is busy at the moment.

## 2015-04-04 LAB — CULTURE, BLOOD (ROUTINE X 2)
CULTURE: NO GROWTH
CULTURE: NO GROWTH

## 2015-04-06 ENCOUNTER — Encounter: Payer: Self-pay | Admitting: *Deleted

## 2015-04-06 ENCOUNTER — Other Ambulatory Visit: Payer: Self-pay | Admitting: *Deleted

## 2015-04-06 ENCOUNTER — Telehealth: Payer: Self-pay | Admitting: Internal Medicine

## 2015-04-06 DIAGNOSIS — J441 Chronic obstructive pulmonary disease with (acute) exacerbation: Secondary | ICD-10-CM | POA: Diagnosis not present

## 2015-04-06 DIAGNOSIS — I4819 Other persistent atrial fibrillation: Secondary | ICD-10-CM

## 2015-04-06 DIAGNOSIS — I5033 Acute on chronic diastolic (congestive) heart failure: Secondary | ICD-10-CM | POA: Diagnosis not present

## 2015-04-06 DIAGNOSIS — I480 Paroxysmal atrial fibrillation: Secondary | ICD-10-CM | POA: Diagnosis not present

## 2015-04-06 DIAGNOSIS — J45901 Unspecified asthma with (acute) exacerbation: Secondary | ICD-10-CM | POA: Diagnosis not present

## 2015-04-06 DIAGNOSIS — E119 Type 2 diabetes mellitus without complications: Secondary | ICD-10-CM | POA: Diagnosis not present

## 2015-04-06 DIAGNOSIS — I10 Essential (primary) hypertension: Secondary | ICD-10-CM | POA: Diagnosis not present

## 2015-04-06 MED ORDER — RIVAROXABAN 15 MG PO TABS: 15.0000 mg | ORAL_TABLET | Freq: Two times a day (BID) | ORAL | Status: DC

## 2015-04-06 NOTE — Telephone Encounter (Signed)
Pt request all her med to go to home delivery, per Justice DeedsLoraine, pt stated Elana AlmGabie is taking care of this and know which pharmacy the pt use. Gabbie please follow up with this, pt need medication to go to home deliver.

## 2015-04-06 NOTE — Telephone Encounter (Signed)
Change to xarelto Rx sent to her pharmacy

## 2015-04-06 NOTE — Telephone Encounter (Signed)
Loraine, [facility] called stated that pt was discharge from the hospital with order of coumadin to take but pt is not taking the coumadin. She is concern about this and was wondering what Dr. Yetta BarreJones recommend. Please let her know  Phone # [phone]

## 2015-04-06 NOTE — Patient Outreach (Signed)
[facility]) Care Management   04/06/2015  Arthi Mcdonald [name] May 23, 1940 [id number]  [name] [name] is an 74 y.o. female  Subjective: Patient reports feeling like "constantly tired".    Objective: BP 140/66 mmHg  Pulse 63  Resp 20  SpO2 98%  LMP 12/01/2010   Review of Systems  Constitutional: Negative.   HENT: Negative.   Eyes:       Wears eyeglasses   Respiratory: Positive for cough, sputum production and wheezing.        Slight expiratory wheeze to left lower lobe otherwise clear Gets short of breath with exertion/ walking On and off productive cough with yellowish phlegm   Cardiovascular:       Bilateral trace pedal edema Regular rate and rhythm  Gastrointestinal:       Obese, soft and non-tender Present bowel sounds  Genitourinary: Positive for frequency.       Wears incontinence pad  Musculoskeletal: Positive for falls.  Skin:       Dry skin to bilateral lower legs Bruised spots to bilateral arms (from blood draws/ IV site)  Neurological: Positive for dizziness.       Dizzy spell or lightheadedness reported when getting up quick but goes away  Endo/Heme/Allergies: Negative.   Psychiatric/Behavioral: Positive for memory loss. The patient is nervous/anxious.        Forgetful at times    Physical Exam  Current Medications:   Current Outpatient Prescriptions  Medication Sig Dispense Refill  . albuterol (PROAIR HFA) 108 (90 BASE) MCG/ACT inhaler Inhale 2 puffs into the lungs every 6 (six) hours as needed for wheezing or shortness of breath. 8.5 each 11  . benzonatate (TESSALON) 100 MG capsule Take 1 capsule (100 mg total) by mouth 3 (three) times daily as needed for cough. 30 capsule 0  . budesonide-formoterol (SYMBICORT) 160-4.5 MCG/ACT inhaler Inhale 2 puffs into the lungs 2 (two) times daily. 1 Inhaler 1  . esomeprazole (NEXIUM) 40 MG capsule Take 1 capsule (40 mg total) by mouth 2 (two) times daily before a meal. 180 capsule 3  .  fluticasone (FLONASE) 50 MCG/ACT nasal spray Place 2 sprays into both nostrils daily. 48 g 3  . guaiFENesin (MUCINEX) 600 MG 12 hr tablet Take 1 tablet (600 mg total) by mouth 2 (two) times daily. 30 tablet 0  . levothyroxine (SYNTHROID) 50 MCG tablet 1 before breakfast EXCEPT 1/2 on Tues & Thurs (Patient taking differently: Take 25-50 mcg by mouth daily before breakfast. 1 before breakfast EXCEPT 1/2 on Tues & Thurs) 90 tablet 1  . loratadine (CLARITIN) 10 MG tablet Take 10 mg by mouth daily.    . metoprolol tartrate (LOPRESSOR) 25 MG tablet Take 1 tablet (25 mg total) by mouth 2 (two) times daily. 60 tablet 1  . montelukast (SINGULAIR) 10 MG tablet Take 1 tablet (10 mg total) by mouth at bedtime. 90 tablet 3  . sodium chloride (OCEAN) 0.65 % SOLN nasal spray Place 2 sprays into both nostrils 4 (four) times daily. (Patient taking differently: Place 2 sprays into both nostrils 2 (two) times daily. ) 90 mL 0  . tolterodine (DETROL LA) 4 MG 24 hr capsule Take 1 capsule (4 mg total) by mouth daily. 90 capsule 3  . torsemide (DEMADEX) 20 MG tablet Take 1 tablet (20 mg total) by mouth daily. 90 tablet 1  . guaiFENesin-dextromethorphan (ROBITUSSIN DM) 100-10 MG/5ML syrup Take 5 mLs by mouth every 4 (four) hours as needed for cough. (Patient not taking: Reported  on 04/06/2015) 118 mL 0  . predniSONE (DELTASONE) 10 MG tablet Prednisone dosing: Take  Prednisone 31m (4 tabs) x 2 days, then taper to 324m(3 tabs) x 3 days, then 2065m2 tabs) x 3days, then 51m24m tab) x 3days, then OFF. (Patient not taking: Reported on 04/06/2015) 26 tablet 0  . warfarin (COUMADIN) 5 MG tablet Take 1 tab (5 mg) on Mon/Friday and 1 1/2 tabs (7.5 mg) tablets on Sun/Tues/Wed/Thurs/Sat. 40 tablet 3   No current facility-administered medications for this visit.    Functional Status:   In your present state of health, do you have any difficulty performing the following activities: 04/06/2015 03/31/2015  Hearing? N -  Vision? N -   Difficulty concentrating or making decisions? Y -  Walking or climbing stairs? Y -  Dressing or bathing? N -  Doing errands, shopping? Y Y Tempie Donningeparing Food and eating ? Y -  Using the Toilet? N -  In the past six months, have you accidently leaked urine? Y -  Do you have problems with loss of bowel control? N -  Managing your Medications? Y -  Managing your Finances? Y -  Housekeeping or managing your Housekeeping? 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 Arrived at patient's house. Patient and son (EdwPercell Millerre present during this visit.  Patient is living alone now since her husband passed away recently. Since that time, patient had increased difficulty with breathing, productive cough, and wheezing that prompted son to bring patient to the emergency room, hence recent hospitalization. Patient reports completing her antibiotic regimen.  Patient's son is her primary caregiver at present. Son is cooking and preparing meals for patient, providing transportation to her appointments; helping finance her medications and manage patient's medications to make sure patient is taking all her medications as directed. Son is reviewing option of obtaining medical alert device for patient's safety. Son reported patient's fall couple of days ago getting up from her bed but no injury noted. Home fall prevention checklist provided and discussed with son and patient. [facility] nurse, physical therapy, occupational therapy and aide are in place to see patient.  Patient has a private pay housekeeper who cleans her house once a week since she is not able to do it and she has allergy to "dust mites". Patient had her Flu shot and pneumonia vaccine as reported.  Hospital follow- up appointment with primary  care provider is scheduled on 12/15.  Patient has a follow-up eye appointment scheduled on 12/14 and dental appointment on 12/20.  Patient reports breathing better and does not use oxygen at home. She reports using Albuterol inhaler daily for now. She takes her medications regularly as directed with son's assistance. Patient and son are aware of need to refill patient's Nexium. Noted that patient does not have Coumadin to take as ordered on discharge. She reports that her medications are on the process to be switched to home medication delivery as assisted by primary provider's office. Care management coordinator called primary care provider's office to inform that patient is currently not taking Coumadin that is listed on discharge instruction. Primary care provider plans and on the process of switching patient from Coumadin to Xarelto instead.    Patient is  able to walk around the house without using her walker. [facility] seen patient today.   Patient states getting tired and short of breath with exertion. Encouraged patient to take rest periods at regular intervals and in between activities to avoid asthma flare-up. Confirmed with patient that she has her quick relief medication (Albuterol) at all times, in case she needs it and to take her preventive (long-term) asthma medications as ordered.  Reviewed with patient the Asthma educational booklet that was provided previously ("Breathing Free"). Patient was able to recall most informations.   Patient denies any other immediate needs at this time. She agreed to transition of care call next week. Patient was encouraged to call [facility], care management coordinator or 24- hour nurse line as needed. Contact informations provided.    Plan: Transition of care call 12/21   [facility] CM Care Plan Problem One        Most Recent Value   Care Plan Problem One  at risk for readmission   Role Documenting the Problem One  Care  Management [place] for Problem One  Active   [facility] Long Term Goal (31-90 days)  patient will not be readmitted in the next 31 days   [facility] Long Term Goal Start Date  04/06/15   [facility] Long Term Goal Met Date  04/06/15 [Goal met- not readmission until 12/5]   Interventions for Problem One Long Term Goal  review medications and confirm that she has all her medication supplies,  call primary care provider's office to clarify Coumadin prescription to be called to patient's pharmacy,  emphasize patient's adherence to medications as ordered (especially asthma medications--not to let it run out),  remind patient to refill medications when running low (aware to refill Nexium),  reinforce attendance to follow-up doctors' appointments,  have her evaluate self daily and monitor for warning signs of asthma flare-up like coughing,shortness of breath,chest tightness and wheezing,  reinforce taking rest periods in between activities,  remind to avoid irrritants that can cause a flare up (dust mites)- private pay housekeeper to clean house once a week    [facility] CM Short Term Goal #1 (0-30 days)  patient will verbalize adherence to medications in the next 30 days   [facility] CM Short Term Goal #1 Start Date  04/06/15   [facility] CM Short Term Goal #1 Met Date  03/05/15 [Goal met]   Interventions for Short Term Goal #1  review medications-remind to refill Nexium,  reiterate benefits/ importance of adhering to medications as ordered (paticularly asthma medications),  call to primary care provider to inform that patient needs Coumadin prescription called to pharmacy,  follow up on home delivery medications,  continue to follow system that son made for her (medications numbered on spreadsheet ccorresponding to numbers on each bottle/container)   [facility] CM Short Term Goal #2 (0-30 days)  patient will identify at least 4 warning signs of asthma flare up and when to call the doctor for help in the next 30 days   [facility] CM Short Term Goal #2  Start Date  04/06/15   [facility] CM Short Term Goal #2 Met Date  02/20/15 [Goal met]   Interventions for Short Term Goal #2  review the warning signs of asthma flare-up,  encourage patient to evaluate self daily for any signs and symptoms of asthma exacerbation and take rescue medication (Albuterol) as ordered if not improving, to call the doctor for help,  have house cleaned by housekeeper  once a week to prevent triggers (dust mites),  remind to review Asthma booklet previously provided   [facility] CM Short Term Goal #3 (0-30 days)  patient will attend follow-up visit with primary care provider as well as scheduled appointments with specialists within the next 2 weeks   [facility] CM Short Term Goal #3 Start Date  04/06/15   [facility] CM Short Term Goal #3 Met Date  03/05/15 [Goal met]   Interventions for Short Tern Goal #3  remind to attend follow-up doctors' appointments as scheduled,  review upcoming follow-up visits on her calendar to be reminded,  emphasize the benefits and importance of attending doctors' appointments   [facility] CM Short Term Goal #4 (0-30 days)  patient will report of checking her blood pressure and recording it in the next 30 days   [facility] CM Short Term Goal #4 Start Date  03/05/15   [facility] CM Short Term Goal #4 Met Date  04/06/15 [Not met- busy with sick husband and passing away recently ]   Interventions for Short Term Goal #4  check with patient for blood pressure readings she has taken and recorded,  reiterate with patient the importance of checking blood pressure and recording in relation to taking blood pressure medications,  remind to use [facility] calendar to record and bring her blood pressure record to doctor's appointment for review        Edwena Felty A. Kierstin January, BSN, RN-BC [place] Management Coordinator Cell: [phone]

## 2015-04-07 ENCOUNTER — Encounter: Payer: Self-pay | Admitting: *Deleted

## 2015-04-07 ENCOUNTER — Telehealth: Payer: Self-pay

## 2015-04-07 NOTE — Telephone Encounter (Signed)
See phone note. 12/8 Called pt to see which pharmacy they were going to use. Spoke with pt son today. They are still not sure which mail pharmacy they are going to use. Advised I need to know where to sent script. He states [facility] is [facility] will call to get more information and call back with prefered pharmacy.

## 2015-04-07 NOTE — Telephone Encounter (Signed)
PA initiated via covermymeds. UJW:[id number]

## 2015-04-08 DIAGNOSIS — I5033 Acute on chronic diastolic (congestive) heart failure: Secondary | ICD-10-CM | POA: Diagnosis not present

## 2015-04-08 DIAGNOSIS — J441 Chronic obstructive pulmonary disease with (acute) exacerbation: Secondary | ICD-10-CM | POA: Diagnosis not present

## 2015-04-08 DIAGNOSIS — J45901 Unspecified asthma with (acute) exacerbation: Secondary | ICD-10-CM | POA: Diagnosis not present

## 2015-04-08 DIAGNOSIS — I10 Essential (primary) hypertension: Secondary | ICD-10-CM | POA: Diagnosis not present

## 2015-04-08 DIAGNOSIS — E119 Type 2 diabetes mellitus without complications: Secondary | ICD-10-CM | POA: Diagnosis not present

## 2015-04-08 DIAGNOSIS — I480 Paroxysmal atrial fibrillation: Secondary | ICD-10-CM | POA: Diagnosis not present

## 2015-04-09 ENCOUNTER — Encounter: Payer: Self-pay | Admitting: Internal Medicine

## 2015-04-09 ENCOUNTER — Ambulatory Visit (INDEPENDENT_AMBULATORY_CARE_PROVIDER_SITE_OTHER): Payer: Medicare Other | Admitting: Internal Medicine

## 2015-04-09 VITALS — BP 138/78 | HR 63 | Temp 98.0°F | Resp 16 | Ht 68.0 in | Wt 206.0 lb

## 2015-04-09 DIAGNOSIS — I4819 Other persistent atrial fibrillation: Secondary | ICD-10-CM

## 2015-04-09 DIAGNOSIS — I519 Heart disease, unspecified: Secondary | ICD-10-CM

## 2015-04-09 DIAGNOSIS — N3281 Overactive bladder: Secondary | ICD-10-CM

## 2015-04-09 DIAGNOSIS — R600 Localized edema: Secondary | ICD-10-CM

## 2015-04-09 DIAGNOSIS — I481 Persistent atrial fibrillation: Secondary | ICD-10-CM

## 2015-04-09 DIAGNOSIS — J4521 Mild intermittent asthma with (acute) exacerbation: Secondary | ICD-10-CM

## 2015-04-09 DIAGNOSIS — I5189 Other ill-defined heart diseases: Secondary | ICD-10-CM

## 2015-04-09 MED ORDER — TORSEMIDE 20 MG PO TABS: 20.0000 mg | ORAL_TABLET | Freq: Every day | ORAL | Status: DC

## 2015-04-09 MED ORDER — TOLTERODINE TARTRATE ER 4 MG PO CP24: 4.0000 mg | ORAL_CAPSULE | Freq: Every day | ORAL | Status: DC

## 2015-04-09 MED ORDER — RIVAROXABAN 15 MG PO TABS: 15.0000 mg | ORAL_TABLET | Freq: Every day | ORAL | Status: DC

## 2015-04-09 MED ORDER — RIVAROXABAN 15 MG PO TABS: 15.0000 mg | ORAL_TABLET | Freq: Two times a day (BID) | ORAL | Status: DC

## 2015-04-09 MED ORDER — BUDESONIDE-FORMOTEROL FUMARATE 160-4.5 MCG/ACT IN AERO: 2.0000 | INHALATION_SPRAY | Freq: Two times a day (BID) | RESPIRATORY_TRACT | Status: DC

## 2015-04-09 NOTE — Progress Notes (Signed)
Subjective:  Patient ID: [name] [name], female    DOB: [birth year]  Age: 74 y.o. MRN: [id number]  CC: Cough; Hypothyroidism; Asthma; and Atrial Fibrillation   HPI [name] [name] presents for hospital follow-up after recent admission for COPD/asthma exacerbation. Her chest x-ray was normal. She was treated with systemic steroids. She tells me today that she is slowly improving. She still has a mild cough that is occasionally productive of foamy, clear phlegm. She has not noticed any wheezing or shortness of breath for several days now. She does complain of fatigue. Was notified by her home health care nurse that she has been refusing to take Coumadin so I have changed her to Xarelto which was renally dosed because her GFR is down to about 47.  Outpatient Prescriptions Prior to Visit  Medication Sig Dispense Refill  . albuterol (PROAIR HFA) 108 (90 BASE) MCG/ACT inhaler Inhale 2 puffs into the lungs every 6 (six) hours as needed for wheezing or shortness of breath. 8.5 each 11  . esomeprazole (NEXIUM) 40 MG capsule Take 1 capsule (40 mg total) by mouth 2 (two) times daily before a meal. 180 capsule 3  . fluticasone (FLONASE) 50 MCG/ACT nasal spray Place 2 sprays into both nostrils daily. 48 g 3  . levothyroxine (SYNTHROID) 50 MCG tablet 1 before breakfast EXCEPT 1/2 on Tues & Thurs (Patient taking differently: Take 25-50 mcg by mouth daily before breakfast. 1 before breakfast EXCEPT 1/2 on Tues & Thurs) 90 tablet 1  . loratadine (CLARITIN) 10 MG tablet Take 10 mg by mouth daily.    . metoprolol tartrate (LOPRESSOR) 25 MG tablet Take 1 tablet (25 mg total) by mouth 2 (two) times daily. 60 tablet 1  . montelukast (SINGULAIR) 10 MG tablet Take 1 tablet (10 mg total) by mouth at bedtime. 90 tablet 3  . sodium chloride (OCEAN) 0.65 % SOLN nasal spray Place 2 sprays into both nostrils 4 (four) times daily. (Patient taking differently: Place 2 sprays into both nostrils 2 (two) times daily.  ) 90 mL 0  . benzonatate (TESSALON) 100 MG capsule Take 1 capsule (100 mg total) by mouth 3 (three) times daily as needed for cough. 30 capsule 0  . budesonide-formoterol (SYMBICORT) 160-4.5 MCG/ACT inhaler Inhale 2 puffs into the lungs 2 (two) times daily. 1 Inhaler 1  . guaiFENesin (MUCINEX) 600 MG 12 hr tablet Take 1 tablet (600 mg total) by mouth 2 (two) times daily. 30 tablet 0  . guaiFENesin-dextromethorphan (ROBITUSSIN DM) 100-10 MG/5ML syrup Take 5 mLs by mouth every 4 (four) hours as needed for cough. (Patient not taking: Reported on 04/06/2015) 118 mL 0  . predniSONE (DELTASONE) 10 MG tablet Prednisone dosing: Take  Prednisone  (4 tabs) x 2 days, then taper to  (3 tabs) x 3 days, then  (2 tabs) x 3days, then  (1 tab) x 3days, then OFF. (Patient not taking: Reported on 04/06/2015) 26 tablet 0  . Rivaroxaban (XARELTO) 15 MG TABS tablet Take 1 tablet (15 mg total) by mouth 2 (two) times daily with a meal. 30 tablet 5  . tolterodine (DETROL LA) 4 MG 24 hr capsule Take 1 capsule (4 mg total) by mouth daily. 90 capsule 3  . torsemide (DEMADEX) 20 MG tablet Take 1 tablet (20 mg total) by mouth daily. 90 tablet 1   No facility-administered medications prior to visit.    ROS Review of Systems  Constitutional: Positive for fatigue. Negative for fever, chills, diaphoresis, activity change, appetite change and unexpected  weight change.  HENT: Negative.   Eyes: Negative.   Respiratory: Positive for cough. Negative for apnea, choking, chest tightness, shortness of breath, wheezing and stridor.   Cardiovascular: Negative.  Negative for chest pain, palpitations and leg swelling.  Gastrointestinal: Negative.  Negative for nausea, vomiting, abdominal pain, diarrhea and constipation.  Endocrine: Negative.   Genitourinary: Negative.   Musculoskeletal: Negative.   Skin: Negative.   Neurological: Negative.  Negative for dizziness, tremors, weakness and light-headedness.  Hematological:  Negative.  Negative for adenopathy. Does not bruise/bleed easily.  Psychiatric/Behavioral: Negative.     Objective:  BP 138/78 mmHg  Pulse 63  Temp(Src) 98 F (36.7 C) (Oral)  Resp 16  Ht  (1.727 m)  Wt 206 lb (93.441 kg)  BMI 31.33 kg/m2  SpO2 96%  LMP 12/01/2010  BP Readings from Last 3 Encounters:  04/09/15 138/78  04/06/15 140/66  04/02/15 160/66    Wt Readings from Last 3 Encounters:  04/09/15 206 lb (93.441 kg)  04/02/15 200 lb 9.9 oz (91 kg)  03/23/15 216 lb (97.977 kg)    Physical Exam  Constitutional: She is oriented to person, place, and time. She appears well-developed and well-nourished.  Non-toxic appearance. She does not have a sickly appearance. She does not appear ill. No distress.  HENT:  Head: Normocephalic and atraumatic.  Mouth/Throat: Oropharynx is clear and moist. No oropharyngeal exudate.  Eyes: Conjunctivae are normal. Right eye exhibits no discharge. Left eye exhibits no discharge. No scleral icterus.  Neck: Normal range of motion. Neck supple. No JVD present. No tracheal deviation present. No thyromegaly present.  Cardiovascular: Normal rate, regular rhythm, normal heart sounds and intact distal pulses.  Exam reveals no gallop and no friction rub.   No murmur heard. Pulmonary/Chest: Effort normal. No accessory muscle usage or stridor. No tachypnea. No respiratory distress. She has decreased breath sounds in the right lower field and the left lower field. She has no wheezes. She has no rhonchi. She has no rales.  Abdominal: Soft. Bowel sounds are normal. She exhibits no distension and no mass. There is no tenderness. There is no rebound and no guarding.  Musculoskeletal: Normal range of motion. She exhibits edema (trace pitting edema in both lower extremities). She exhibits no tenderness.  Lymphadenopathy:    She has no cervical adenopathy.  Neurological: She is oriented to person, place, and time.  Skin: Skin is warm and dry. No rash noted.  She is not diaphoretic. No erythema. No pallor.  Psychiatric: She has a normal mood and affect. Her behavior is normal. Judgment and thought content normal.  Vitals reviewed.   Lab Results  Component Value Date   WBC 12.9* 04/02/2015   HGB 12.2 04/02/2015   HCT 37.1 04/02/2015   PLT 318 04/02/2015   GLUCOSE 166* 04/02/2015   CHOL 208* 06/19/2014   TRIG 100.0 06/19/2014   HDL 61.70 06/19/2014   LDLCALC 126* 06/19/2014   ALT 20 03/30/2015   AST 29 03/30/2015   NA 136 04/02/2015   K 3.6 04/02/2015   CL 96* 04/02/2015   CREATININE 1.21* 04/02/2015   BUN 36* 04/02/2015   CO2 30 04/02/2015   TSH 0.678 01/22/2015   INR 2.10* 04/02/2015   HGBA1C 6.2* 04/02/2015   MICROALBUR 1.2 09/28/2012    Dg Chest 2 View  03/30/2015  CLINICAL DATA:  Cough, shortness of breath, fever EXAM: CHEST  2 VIEW COMPARISON:  01/20/2015 FINDINGS: The heart size and mediastinal contours are within normal limits. Both lungs are  clear. The visualized skeletal structures are unremarkable. IMPRESSION: No active cardiopulmonary disease. Electronically Signed   By: Charlett NoseKevin  Dover M.D.   On: 03/30/2015 13:39    Assessment & Plan:   [name] [name] was seen today for cough, hypothyroidism, asthma and atrial fibrillation.  Diagnoses and all orders for this visit:  OAB (overactive bladder) -     tolterodine (DETROL LA) 4 MG 24 hr capsule; Take 1 capsule (4 mg total) by mouth daily.  Diastolic dysfunction- improvement noted, continue Demadex -     torsemide (DEMADEX) 20 MG tablet; Take 1 tablet (20 mg total) by mouth daily.  Localized edema- improvement noted, continue Demadex -     torsemide (DEMADEX) 20 MG tablet; Take 1 tablet (20 mg total) by mouth daily.  Persistent atrial fibrillation (HCC)- anticoagulation with Coumadin was too complicated for her, she has been changed to Xarelto, dosed based on her renal function. -     Discontinue: Rivaroxaban (XARELTO) 15 MG TABS tablet; Take 1 tablet (15 mg total) by mouth 2  (two) times daily with a meal. -     Discontinue: Rivaroxaban (XARELTO) 15 MG TABS tablet; Take 1 tablet (15 mg total) by mouth daily with supper. -     Rivaroxaban (XARELTO) 15 MG TABS tablet; Take 1 tablet (15 mg total) by mouth daily with supper.  Asthma, mild intermittent, with acute exacerbation- improvement noted, will continue Spiriva and Symbicort. -     budesonide-formoterol (SYMBICORT) 160-4.5 MCG/ACT inhaler; Inhale 2 puffs into the lungs 2 (two) times daily.  I have discontinued [name] [name]'s predniSONE, benzonatate, guaiFENesin-dextromethorphan, and guaiFENesin. I am also having her maintain her loratadine, sodium chloride, esomeprazole, montelukast, albuterol, metoprolol tartrate, levothyroxine, fluticasone, tolterodine, torsemide, Rivaroxaban, and budesonide-formoterol.  Meds ordered this encounter  Medications  . tolterodine (DETROL LA) 4 MG 24 hr capsule    Sig: Take 1 capsule (4 mg total) by mouth daily.    Dispense:  90 capsule    Refill:  3  . torsemide (DEMADEX) 20 MG tablet    Sig: Take 1 tablet (20 mg total) by mouth daily.    Dispense:  90 tablet    Refill:  1  . DISCONTD: Rivaroxaban (XARELTO) 15 MG TABS tablet    Sig: Take 1 tablet (15 mg total) by mouth 2 (two) times daily with a meal.    Dispense:  180 tablet    Refill:  1  . DISCONTD: Rivaroxaban (XARELTO) 15 MG TABS tablet    Sig: Take 1 tablet (15 mg total) by mouth daily with supper.    Dispense:  90 tablet    Refill:  1  . Rivaroxaban (XARELTO) 15 MG TABS tablet    Sig: Take 1 tablet (15 mg total) by mouth daily with supper.    Dispense:  90 tablet    Refill:  1    Disregard prior sig for this med  . budesonide-formoterol (SYMBICORT) 160-4.5 MCG/ACT inhaler    Sig: Inhale 2 puffs into the lungs 2 (two) times daily.    Dispense:  1 Inhaler    Refill:  11     Follow-up: Return in about 3 months (around 07/08/2015).  Sanda Lingerhomas Marykay Mccleod, MD

## 2015-04-09 NOTE — Progress Notes (Signed)
Pre visit review using our clinic review tool, if applicable. No additional management support is needed unless otherwise documented below in the visit note. 

## 2015-04-09 NOTE — Patient Instructions (Signed)
Asthma, Adult Asthma is a recurring condition in which the airways tighten and narrow. Asthma can make it difficult to breathe. It can cause coughing, wheezing, and shortness of breath. Asthma episodes, also called asthma attacks, range from minor to life-threatening. Asthma cannot be cured, but medicines and lifestyle changes can help control it. CAUSES Asthma is believed to be caused by inherited (genetic) and environmental factors, but its exact cause is unknown. Asthma may be triggered by allergens, lung infections, or irritants in the air. Asthma triggers are different for each person. Common triggers include:   Animal dander.  Dust mites.  Cockroaches.  Pollen from trees or grass.  Mold.  Smoke.  Air pollutants such as dust, household cleaners, hair sprays, aerosol sprays, paint fumes, strong chemicals, or strong odors.  Cold air, weather changes, and winds (which increase molds and pollens in the air).  Strong emotional expressions such as crying or laughing hard.  Stress.  Certain medicines (such as aspirin) or types of drugs (such as beta-blockers).  Sulfites in foods and drinks. Foods and drinks that may contain sulfites include dried fruit, potato chips, and sparkling grape juice.  Infections or inflammatory conditions such as the flu, a cold, or an inflammation of the nasal membranes (rhinitis).  Gastroesophageal reflux disease (GERD).  Exercise or strenuous activity. SYMPTOMS Symptoms may occur immediately after asthma is triggered or many hours later. Symptoms include:  Wheezing.  Excessive nighttime or early morning coughing.  Frequent or severe coughing with a common cold.  Chest tightness.  Shortness of breath. DIAGNOSIS  The diagnosis of asthma is made by a review of your medical history and a physical exam. Tests may also be performed. These may include:  Lung function studies. These tests show how much air you breathe in and out.  Allergy  tests.  Imaging tests such as X-rays. TREATMENT  Asthma cannot be cured, but it can usually be controlled. Treatment involves identifying and avoiding your asthma triggers. It also involves medicines. There are 2 classes of medicine used for asthma treatment:   Controller medicines. These prevent asthma symptoms from occurring. They are usually taken every day.  Reliever or rescue medicines. These quickly relieve asthma symptoms. They are used as needed and provide short-term relief. Your health care provider will help you create an asthma action plan. An asthma action plan is a written plan for managing and treating your asthma attacks. It includes a list of your asthma triggers and how they may be avoided. It also includes information on when medicines should be taken and when their dosage should be changed. An action plan may also involve the use of a device called a peak flow meter. A peak flow meter measures how well the lungs are working. It helps you monitor your condition. HOME CARE INSTRUCTIONS   Take medicines only as directed by your health care provider. Speak with your health care provider if you have questions about how or when to take the medicines.  Use a peak flow meter as directed by your health care provider. Record and keep track of readings.  Understand and use the action plan to help minimize or stop an asthma attack without needing to seek medical care.  Control your home environment in the following ways to help prevent asthma attacks:  Do not smoke. Avoid being exposed to secondhand smoke.  Change your heating and air conditioning filter regularly.  Limit your use of fireplaces and wood stoves.  Get rid of pests (such as roaches   and mice) and their droppings.  Throw away plants if you see mold on them.  Clean your floors and dust regularly. Use unscented cleaning products.  Try to have someone else vacuum for you regularly. Stay out of rooms while they are  being vacuumed and for a short while afterward. If you vacuum, use a dust mask from a hardware store, a double-layered or microfilter vacuum cleaner bag, or a vacuum cleaner with a HEPA filter.  Replace carpet with wood, tile, or vinyl flooring. Carpet can trap dander and dust.  Use allergy-proof pillows, mattress covers, and box spring covers.  Wash bed sheets and blankets every week in hot water and dry them in a dryer.  Use blankets that are made of polyester or cotton.  Clean bathrooms and kitchens with bleach. If possible, have someone repaint the walls in these rooms with mold-resistant paint. Keep out of the rooms that are being cleaned and painted.  Wash hands frequently. SEEK MEDICAL CARE IF:   You have wheezing, shortness of breath, or a cough even if taking medicine to prevent attacks.  The colored mucus you cough up (sputum) is thicker than usual.  Your sputum changes from clear or white to yellow, green, gray, or bloody.  You have any problems that may be related to the medicines you are taking (such as a rash, itching, swelling, or trouble breathing).  You are using a reliever medicine more than 2-3 times per week.  Your peak flow is still at 50-79% of your personal best after following your action plan for 1 hour.  You have a fever. SEEK IMMEDIATE MEDICAL CARE IF:   You seem to be getting worse and are unresponsive to treatment during an asthma attack.  You are short of breath even at rest.  You get short of breath when doing very little physical activity.  You have difficulty eating, drinking, or talking due to asthma symptoms.  You develop chest pain.  You develop a fast heartbeat.  You have a bluish color to your lips or fingernails.  You are light-headed, dizzy, or faint.  Your peak flow is less than 50% of your personal best.   This information is not intended to replace advice given to you by your health care provider. Make sure you discuss any  questions you have with your health care provider.   Document Released: 04/11/2005 Document Revised: 12/31/2014 Document Reviewed: 11/08/2012 Elsevier Interactive Patient Education 2016 Elsevier Inc.  

## 2015-04-10 DIAGNOSIS — I10 Essential (primary) hypertension: Secondary | ICD-10-CM | POA: Diagnosis not present

## 2015-04-10 DIAGNOSIS — I480 Paroxysmal atrial fibrillation: Secondary | ICD-10-CM | POA: Diagnosis not present

## 2015-04-10 DIAGNOSIS — J441 Chronic obstructive pulmonary disease with (acute) exacerbation: Secondary | ICD-10-CM | POA: Diagnosis not present

## 2015-04-10 DIAGNOSIS — E119 Type 2 diabetes mellitus without complications: Secondary | ICD-10-CM | POA: Diagnosis not present

## 2015-04-10 DIAGNOSIS — I5033 Acute on chronic diastolic (congestive) heart failure: Secondary | ICD-10-CM | POA: Diagnosis not present

## 2015-04-10 DIAGNOSIS — J45901 Unspecified asthma with (acute) exacerbation: Secondary | ICD-10-CM | POA: Diagnosis not present

## 2015-04-14 ENCOUNTER — Ambulatory Visit: Payer: Self-pay | Admitting: General Practice

## 2015-04-14 DIAGNOSIS — I480 Paroxysmal atrial fibrillation: Secondary | ICD-10-CM | POA: Diagnosis not present

## 2015-04-14 DIAGNOSIS — I5033 Acute on chronic diastolic (congestive) heart failure: Secondary | ICD-10-CM | POA: Diagnosis not present

## 2015-04-14 DIAGNOSIS — J441 Chronic obstructive pulmonary disease with (acute) exacerbation: Secondary | ICD-10-CM | POA: Diagnosis not present

## 2015-04-14 DIAGNOSIS — J45901 Unspecified asthma with (acute) exacerbation: Secondary | ICD-10-CM | POA: Diagnosis not present

## 2015-04-14 DIAGNOSIS — I10 Essential (primary) hypertension: Secondary | ICD-10-CM | POA: Diagnosis not present

## 2015-04-14 DIAGNOSIS — E119 Type 2 diabetes mellitus without complications: Secondary | ICD-10-CM | POA: Diagnosis not present

## 2015-04-15 ENCOUNTER — Other Ambulatory Visit: Payer: Self-pay | Admitting: *Deleted

## 2015-04-15 DIAGNOSIS — J45901 Unspecified asthma with (acute) exacerbation: Secondary | ICD-10-CM | POA: Diagnosis not present

## 2015-04-15 DIAGNOSIS — I5033 Acute on chronic diastolic (congestive) heart failure: Secondary | ICD-10-CM | POA: Diagnosis not present

## 2015-04-15 DIAGNOSIS — I10 Essential (primary) hypertension: Secondary | ICD-10-CM | POA: Diagnosis not present

## 2015-04-15 DIAGNOSIS — E119 Type 2 diabetes mellitus without complications: Secondary | ICD-10-CM | POA: Diagnosis not present

## 2015-04-15 DIAGNOSIS — I480 Paroxysmal atrial fibrillation: Secondary | ICD-10-CM | POA: Diagnosis not present

## 2015-04-15 DIAGNOSIS — J441 Chronic obstructive pulmonary disease with (acute) exacerbation: Secondary | ICD-10-CM | POA: Diagnosis not present

## 2015-04-15 NOTE — Patient Outreach (Addendum)
[facility]) Care Management  04/15/2015  [name] [name] 10/09/1940 [id number]   Assessment: Transition of care - week 2 Call placed to speak with patient twice but unable to reach her. HIPAA compliant voice messages left with name and contact number.  Plan: Will await for patient's return call. If unable to receive a call back, will schedule for next outreach call.  Adelheid Hoggard A. Ikia Cincotta, BSN, RN-BC [facility] Community Care Management Coordinator Cell: [phone]

## 2015-04-21 ENCOUNTER — Other Ambulatory Visit: Payer: Self-pay | Admitting: *Deleted

## 2015-04-21 NOTE — Patient Outreach (Addendum)
[facility]) Care Management  04/21/2015  [name] 1940/05/25 [id number]   Assessment: Transition of care call - week 3 Call placed and spoke to patient. She apologized for not being able to contact last week. Patient states that she is "feeling very much better". She denies feeling of any warning signs of asthma flare-up at this time. Patient was seen by her primary care provider on 12/15 for hospital follow-up after recent admission for COPD/asthma exacerbation. Her chest x-ray result was normal. She reports having a mild cough but not often and no expectorant as stated. Denies any wheezing. She verbalized complain of fatigue and mentions taking rest and nap to help it.  Patient reports having shortness of breath when walking a distance but resolves with rest. She mentioned noticing slight swelling on her lower extremities. She is aware to elevate at intervals and watch for her salt intake. Patient states she reads labels before purchasing any food. She reports using her Albuterol one time last week but none this week. She continues on Symbicort and Spiriva.  Per primary care provider's note, Coumadin is complicated for patient. She was changed to Xarelto instead, which patient reports taking it daily.  Patient states no changes on her medications except for Xarelto. She reports having all her medication supplies for now but still not on home delivery basis.  Called primary care provider's office and notified that patient's medications are still not home delivered per patient's report. Primary provider's office notified care management coordinator that prior authorization is still pending to see if insurance will cover for home medication delivery.   According to patient, [facility] occupational therapist and nurse sees patient once a week. Patient reports being able to see her eye doctor on this recent appointment and got a new pair of eyeglasses.   According to patient, she cancelled her dental appointment on 12/20 since she did not have the money to pay $200 for cleaning her teeth. She states she will try to call and schedule with the "lady dentist" she used to go to years ago. Encourage her to schedule one as appropriate.   Patient denies any other health concerns at this time. She agreed to transition of care call next week.  She is aware [facility], care management coordinator or 24-hour nurse line as needed. Patient has the contact informations.   Plan: Will follow-up on home medication delivery. Transition of care call on 04/29/2015.  Trenice Mesa A. Fujie Dickison, BSN, RN-BC [facility] Community Care Management Coordinator Cell: [phone]

## 2015-04-23 DIAGNOSIS — I10 Essential (primary) hypertension: Secondary | ICD-10-CM | POA: Diagnosis not present

## 2015-04-23 DIAGNOSIS — E119 Type 2 diabetes mellitus without complications: Secondary | ICD-10-CM | POA: Diagnosis not present

## 2015-04-23 DIAGNOSIS — J441 Chronic obstructive pulmonary disease with (acute) exacerbation: Secondary | ICD-10-CM | POA: Diagnosis not present

## 2015-04-23 DIAGNOSIS — I5033 Acute on chronic diastolic (congestive) heart failure: Secondary | ICD-10-CM | POA: Diagnosis not present

## 2015-04-23 DIAGNOSIS — I480 Paroxysmal atrial fibrillation: Secondary | ICD-10-CM | POA: Diagnosis not present

## 2015-04-23 DIAGNOSIS — J45901 Unspecified asthma with (acute) exacerbation: Secondary | ICD-10-CM | POA: Diagnosis not present

## 2015-04-25 DIAGNOSIS — I480 Paroxysmal atrial fibrillation: Secondary | ICD-10-CM | POA: Diagnosis not present

## 2015-04-25 DIAGNOSIS — I10 Essential (primary) hypertension: Secondary | ICD-10-CM | POA: Diagnosis not present

## 2015-04-25 DIAGNOSIS — I5033 Acute on chronic diastolic (congestive) heart failure: Secondary | ICD-10-CM | POA: Diagnosis not present

## 2015-04-25 DIAGNOSIS — J441 Chronic obstructive pulmonary disease with (acute) exacerbation: Secondary | ICD-10-CM | POA: Diagnosis not present

## 2015-04-25 DIAGNOSIS — E119 Type 2 diabetes mellitus without complications: Secondary | ICD-10-CM | POA: Diagnosis not present

## 2015-04-25 DIAGNOSIS — J45901 Unspecified asthma with (acute) exacerbation: Secondary | ICD-10-CM | POA: Diagnosis not present

## 2015-04-28 NOTE — Telephone Encounter (Signed)
Patient verification is unable to be done through cover my meds.  Called pt and requested Rx ID information. Pt informed that she has lost her card. I requested for pt to call for a new and request the PA form to be faxed directly to side A.

## 2015-04-29 ENCOUNTER — Ambulatory Visit: Payer: Medicare Other | Admitting: *Deleted

## 2015-04-29 ENCOUNTER — Encounter: Payer: Self-pay | Admitting: *Deleted

## 2015-04-30 NOTE — Progress Notes (Signed)
This encounter was created in error - please disregard.

## 2015-05-01 ENCOUNTER — Telehealth: Payer: Self-pay

## 2015-05-01 NOTE — Telephone Encounter (Signed)
Called patient unable to reach, need new insurance information.  

## 2015-05-05 ENCOUNTER — Other Ambulatory Visit: Payer: Self-pay | Admitting: *Deleted

## 2015-05-05 ENCOUNTER — Telehealth: Payer: Self-pay

## 2015-05-05 NOTE — Patient Outreach (Signed)
[facility]) Care Management  05/05/2015  [name] [name] 05-31-1940 [id number]   Assessment: Transition of care - week 4 Call placed to follow-up with transition of care but unable to reach patient at this time. HIPAA compliant voice message left with name and contact information.  Plan: Will await for patient's return call. If unable to receive a return call, will schedule for next outreach call.  Emmanuelle Coxe A. Girtrude Enslin, BSN, RN-BC [facility] Community Care Management Coordinator Cell: [phone]

## 2015-05-05 NOTE — Telephone Encounter (Signed)
PA initiated via covermymeds. Key [id number]

## 2015-05-06 ENCOUNTER — Other Ambulatory Visit: Payer: Self-pay | Admitting: *Deleted

## 2015-05-06 NOTE — Patient Outreach (Signed)
[facility] ([facility]) Care Management  05/06/2015  [facility]ingMargaret M [facility] [birth year] [id number]   Assessment: Transition of care - week 4 (follow-up call) Unable to receive a call back from patient to message left on her voicemail yesterday. Call placed to follow-up with transition of care but unable to reach patient at this time. HIPAA compliant voice message left with name and contact number.  Plan: Will await for patient to call back. If unable to receive a return call, will schedule for next outreach call.  Raeley Gilmore A. Beckey Polkowski, BSN, RN-BC [facility] Community Care Management Coordinator Cell: [phone]

## 2015-05-07 DIAGNOSIS — E119 Type 2 diabetes mellitus without complications: Secondary | ICD-10-CM | POA: Diagnosis not present

## 2015-05-07 DIAGNOSIS — J45901 Unspecified asthma with (acute) exacerbation: Secondary | ICD-10-CM | POA: Diagnosis not present

## 2015-05-07 DIAGNOSIS — J441 Chronic obstructive pulmonary disease with (acute) exacerbation: Secondary | ICD-10-CM | POA: Diagnosis not present

## 2015-05-07 DIAGNOSIS — I10 Essential (primary) hypertension: Secondary | ICD-10-CM | POA: Diagnosis not present

## 2015-05-07 DIAGNOSIS — I480 Paroxysmal atrial fibrillation: Secondary | ICD-10-CM | POA: Diagnosis not present

## 2015-05-07 DIAGNOSIS — I5033 Acute on chronic diastolic (congestive) heart failure: Secondary | ICD-10-CM | POA: Diagnosis not present

## 2015-05-08 ENCOUNTER — Telehealth: Payer: Self-pay

## 2015-05-08 NOTE — Telephone Encounter (Addendum)
Appeal paperwork has been faxed awaiting ins response

## 2015-05-11 ENCOUNTER — Other Ambulatory Visit: Payer: Self-pay | Admitting: *Deleted

## 2015-05-11 NOTE — Patient Outreach (Addendum)
[facility]) Care Management  05/11/2015  [name] [name] December 02, 1940 [id number]   Assessment: Transition of care follow-up call Unable to receive any call back from patient to messages left on her voicemail last week. Call placed to patient for follow-up but unable to reach her. HIPAA compliant voice message left with name and contact number.  Plan: Will await for return call. If unable to receive a call back, will schedule for next outreach call.  Caydyn Sprung A. Fancy Dunkley, BSN, RN-BC [facility] Community Care Management Coordinator Cell: [phone]

## 2015-05-14 ENCOUNTER — Other Ambulatory Visit: Payer: Self-pay | Admitting: *Deleted

## 2015-05-14 DIAGNOSIS — J441 Chronic obstructive pulmonary disease with (acute) exacerbation: Secondary | ICD-10-CM

## 2015-05-14 NOTE — Patient Outreach (Signed)
[facility]) Care Management  05/14/2015  [name] [name] May 02, 1940 [id number]   Assessment: Transition of care follow-up call Call placed and spoke with patient stating she is "doing pretty good". She reports attending the [facility] today. Patient reports having been to the supermarket to do grocery shopping and was able to tolerate it.  She verbalized being on the "green zone" with very seldom coughing, no mucus production, no change in appetite or sleep. She reports having shortness of breath and fatigue with physical exertion but she usually stops to rest which relieves it.  She also states using Albuterol inhaler as needed and had used it twice this week as reported.  Patient reports "a little bit" of swelling more evident on her left lower leg than on the right. She states that she's been watching her sodium intake and elevating her legs when she gets the chance. Patient also mentioned that her bed allows to elevate the foot part while she is laying down in bed.  Patient still needs reinforcement regarding action plan on Asthma/ COPD exacerbation as well as evaluating for asthma warning sings of a flare up.  Per patient report, her medications remain the same and has been taking them as ordered. Primary care provider's office is currently assisting in the process of having patient's medications delivered to home with prior authorization still pending to see if insurance will cover for home medication delivery. Appeal paperwork has been forwarded and awaiting for insurance response per primary provider's office.   Patient has a scheduled follow-up visit with primary care provider on 07/08/15.  She verbalized that she will reschedule dentist appointment with the lady dentist she use to go to once she has enough money to pay for dental services. According to patient, [facility] nurse is still actively seeing her.  Patient denies any other needs or  concerns at this time. She agreed to be transitioned to health coach for further disease management and states "sounds like a good idea". Patient still needs further reinforcement on action plan for Asthma/ COPD exacerbation as well as evaluating for asthma flare up.  She is aware to call [facility], care management coordinator or 24-hour nurse line as needed or if further needs arise. Patient has contact informations with her.                  Plan: Will refer to [facility] health coach for further disease management of COPD exacerbation/ asthma.  Cecille Mcclusky A. Chana Lindstrom, BSN, RN-BC [facility] Community Care Management Coordinator Cell: [phone]

## 2015-05-15 ENCOUNTER — Encounter: Payer: Self-pay | Admitting: *Deleted

## 2015-05-19 ENCOUNTER — Telehealth: Payer: Self-pay

## 2015-05-19 NOTE — Telephone Encounter (Signed)
Pt form completed, stamped with MD's signature and faxed back to plan [phone], case number [id number]

## 2015-05-20 ENCOUNTER — Other Ambulatory Visit: Payer: Self-pay

## 2015-05-20 NOTE — Patient Outreach (Signed)
[facility]) Care Management  05/20/2015  [name] [name] 10/08/1940 [id number]   Telephone call to patient for health coach introductory call. Patient reports she is doing ok and is receptive to the call.  Advised patient of health coach and role.  She verbalized understanding.    Plan: RN Health Coach will contact patient within one month and patient agrees to next outreach.    Bary Leriche, RN, MSN [facility] Care Management RN Telephonic Health Coach [phone]

## 2015-05-21 DIAGNOSIS — J441 Chronic obstructive pulmonary disease with (acute) exacerbation: Secondary | ICD-10-CM | POA: Diagnosis not present

## 2015-05-21 DIAGNOSIS — I5033 Acute on chronic diastolic (congestive) heart failure: Secondary | ICD-10-CM | POA: Diagnosis not present

## 2015-05-21 DIAGNOSIS — I10 Essential (primary) hypertension: Secondary | ICD-10-CM | POA: Diagnosis not present

## 2015-05-21 DIAGNOSIS — E119 Type 2 diabetes mellitus without complications: Secondary | ICD-10-CM | POA: Diagnosis not present

## 2015-05-21 DIAGNOSIS — I480 Paroxysmal atrial fibrillation: Secondary | ICD-10-CM | POA: Diagnosis not present

## 2015-05-21 DIAGNOSIS — J45901 Unspecified asthma with (acute) exacerbation: Secondary | ICD-10-CM | POA: Diagnosis not present

## 2015-05-21 NOTE — Telephone Encounter (Signed)
PA denied again.

## 2015-05-21 NOTE — Telephone Encounter (Signed)
Appeal for  Xarelto was denied because "Xarelto is not FDA approved for atrial fibrillation", please advise on alternative medication, thanks

## 2015-05-22 NOTE — Telephone Encounter (Signed)
Xarelto is FDA approved for a fib

## 2015-05-25 NOTE — Telephone Encounter (Signed)
Denial states medication is not FDA approved for atrial fibrillation in a patient with an artifical heart value. Appeal paperwork states pt has Aortic Stenosis with Aortic Valve Replacement, please advise

## 2015-05-25 NOTE — Telephone Encounter (Signed)
SHE HAS NOT HAD A VALVE REPLACEMENT I wrote this on her appeal form

## 2015-05-25 NOTE — Telephone Encounter (Signed)
Message left with Appeals Dept [name] [name]) to call me back so we can update incorrect information documented on appeal form

## 2015-05-28 DIAGNOSIS — E119 Type 2 diabetes mellitus without complications: Secondary | ICD-10-CM | POA: Diagnosis not present

## 2015-05-28 DIAGNOSIS — J45901 Unspecified asthma with (acute) exacerbation: Secondary | ICD-10-CM | POA: Diagnosis not present

## 2015-05-28 DIAGNOSIS — I5033 Acute on chronic diastolic (congestive) heart failure: Secondary | ICD-10-CM | POA: Diagnosis not present

## 2015-05-28 DIAGNOSIS — J441 Chronic obstructive pulmonary disease with (acute) exacerbation: Secondary | ICD-10-CM | POA: Diagnosis not present

## 2015-05-28 DIAGNOSIS — I480 Paroxysmal atrial fibrillation: Secondary | ICD-10-CM | POA: Diagnosis not present

## 2015-05-28 DIAGNOSIS — I10 Essential (primary) hypertension: Secondary | ICD-10-CM | POA: Diagnosis not present

## 2015-05-29 NOTE — Telephone Encounter (Signed)
Per Randa Evens at appeal dept, appeal form has already been sent in for this patient.  Forwarding to [place], CMA as she has already begun appeal process

## 2015-05-29 NOTE — Telephone Encounter (Signed)
Called medicare and they were unable to give me the number of the patient prescription drug plan due to hippa.   I called the pt to request information.   Letter and other documentation attached to be faxed to Medicare.

## 2015-06-01 NOTE — Telephone Encounter (Signed)
Letter has been sign.   Pt information along with letter and faxed cover sent to fax number listed below.   Information in Dahlia's box in the event that I am not available.

## 2015-06-04 ENCOUNTER — Other Ambulatory Visit: Payer: Self-pay | Admitting: Internal Medicine

## 2015-06-15 ENCOUNTER — Ambulatory Visit: Payer: Self-pay

## 2015-06-15 ENCOUNTER — Other Ambulatory Visit: Payer: Self-pay

## 2015-06-15 NOTE — Patient Outreach (Signed)
[facility]) Care Management  06/15/2015  [name] [name] Dec 20, 1940 [id number]   Attempted telephone call to patient for initial assessment.  No answer.  HIPAA compliant voice message left.    Plan: RN Health Coach will attempt patient within 1-2 weeks.    Bary Leriche, RN, MSN [facility] Care Management RN Telephonic Health Coach [phone]

## 2015-06-16 ENCOUNTER — Telehealth: Payer: Self-pay | Admitting: *Deleted

## 2015-06-16 DIAGNOSIS — I4819 Other persistent atrial fibrillation: Secondary | ICD-10-CM

## 2015-06-16 DIAGNOSIS — E038 Other specified hypothyroidism: Secondary | ICD-10-CM

## 2015-06-16 MED ORDER — LISINOPRIL 20 MG PO TABS: 20.0000 mg | ORAL_TABLET | Freq: Every day | ORAL | Status: DC

## 2015-06-16 MED ORDER — RIVAROXABAN 15 MG PO TABS: 15.0000 mg | ORAL_TABLET | Freq: Every day | ORAL | Status: DC

## 2015-06-16 MED ORDER — LEVOTHYROXINE SODIUM 50 MCG PO TABS: ORAL_TABLET | ORAL | Status: DC

## 2015-06-16 NOTE — Addendum Note (Signed)
Addended by: Deatra James on: 06/16/2015 01:07 PM   Modules accepted: Orders

## 2015-06-16 NOTE — Telephone Encounter (Signed)
Received call pt states she is needing [facility] her Lisinopril, Levothyroxine and xarelto. Inform pt per MD due for 3 month appt in March. Will send 30 day until appt. Made appt for 07/02/15...[name]

## 2015-06-19 ENCOUNTER — Other Ambulatory Visit: Payer: Self-pay

## 2015-06-19 NOTE — Patient Outreach (Signed)
[facility]) Care Management  [facility] Care Manager  06/19/2015   [name] [name] 1941-02-22 [id number]  Subjective: Telephone call to patient for initial health coach outreach. Patient reports she is feeling some tired today.  Patient reports she just had to use her inhaler for some tightness with her breathing.  Patient reports that she thinks due to the weather her allergies are messing with her.  Discussed with patient signs and symptoms of COPD flare and when to notify physician.  She verbalized understanding.      Objective:   Current Medications:  Current Outpatient Prescriptions  Medication Sig Dispense Refill  . albuterol (PROAIR HFA) 108 (90 BASE) MCG/ACT inhaler Inhale 2 puffs into the lungs every 6 (six) hours as needed for wheezing or shortness of breath. 8.5 each 11  . budesonide-formoterol (SYMBICORT) 160-4.5 MCG/ACT inhaler Inhale 2 puffs into the lungs 2 (two) times daily. 1 Inhaler 11  . esomeprazole (NEXIUM) 40 MG capsule Take 1 capsule (40 mg total) by mouth 2 (two) times daily before a meal. 180 capsule 3  . fluticasone (FLONASE) 50 MCG/ACT nasal spray Place 2 sprays into both nostrils daily. 48 g 3  . levothyroxine (SYNTHROID) 50 MCG tablet 1 before breakfast EXCEPT 1/2 on Tues & Thurs Keep march appt for future refills 30 tablet 0  . lisinopril (PRINIVIL,ZESTRIL) 20 MG tablet Take 1 tablet (20 mg total) by mouth daily. Must keep march appr for future refills 30 tablet 0  . loratadine (CLARITIN) 10 MG tablet Take 10 mg by mouth daily.    . metoprolol tartrate (LOPRESSOR) 25 MG tablet TAKE 1 TABLET (25 MG TOTAL) BY MOUTH 2 (TWO) TIMES DAILY. 60 tablet 2  . montelukast (SINGULAIR) 10 MG tablet Take 1 tablet (10 mg total) by mouth at bedtime. 90 tablet 3  . Rivaroxaban (XARELTO) 15 MG TABS tablet Take 1 tablet (15 mg total) by mouth daily with supper. Keep 07/08/15 appt for future refills 30 tablet 0  . sodium chloride (OCEAN) 0.65 % SOLN nasal spray  Place 2 sprays into both nostrils 4 (four) times daily. (Patient taking differently: Place 2 sprays into both nostrils 2 (two) times daily. ) 90 mL 0  . tolterodine (DETROL LA) 4 MG 24 hr capsule Take 1 capsule (4 mg total) by mouth daily. 90 capsule 3  . torsemide (DEMADEX) 20 MG tablet Take 1 tablet (20 mg total) by mouth daily. 90 tablet 1   No current facility-administered medications for this visit.    Functional Status:  In your present state of health, do you have any difficulty performing the following activities: 06/19/2015 04/06/2015  Hearing? N -  Vision? N -  Difficulty concentrating or making decisions? Y (No Data)  Walking or climbing stairs? Y -  Dressing or bathing? N -  Doing errands, shopping? Y -  Quarry manager and eating ? Y -  Using the Toilet? N -  In the past six months, have you accidently leaked urine? Y -  Do you have problems with loss of bowel control? N -  Managing your Medications? Y -  Managing your Finances? Y -  Housekeeping or managing your Housekeeping? Y -    Fall/Depression Screening: PHQ 2/9 Scores 06/19/2015 04/06/2015 02/06/2015 04/12/2013  PHQ - 2 Score 0 0 3 0  PHQ- 9 Score - - 7 -    Assessment: Patient will benefit from health coach outreach for disease management and support of COPD.     Plan:  [facility] CM  Care Plan Problem One        Most Recent Value   Care Plan Problem One  Knowledge deficit COPD   Role Documenting the Problem One  Health Coach   Care Plan for Problem One  Active   [facility] Long Term Goal (31-90 days)  Patient will continue to utilize COPD action plan as part of daily management within the next 90 days.    [facility] Long Term Goal Start Date  06/19/15   Interventions for Problem One Long Term Goal  RN Health Coach reviewed with patient COPD action plan and encouraged to continue to utilize it for self-management.  Also discussed with patient when to notify physician.    [facility] CM Short Term Goal #2 (0-30 days)  Patient will  identify at least 4 warning signs of COPD/asthma flare up and when to call the doctor for help in the next 30 days   [facility] CM Short Term Goal #2 Start Date  06/19/15   Interventions for Short Term Goal #2  RN Health Coach reviewed with patient yellow zone of COPD action plan.      RN Health Coach will provide ongoing education for patient on COPD through phone calls and sending printed information to patient for further discussion.  RN Health Coach sent welcome letter to patient.  RN Health Coach will send initial barriers letter, assessment, and care plan to primary care physician.  RN Health Coach will contact patient within one month and patient agrees to next contact.   Bary Leriche, RN, MSN [facility] Care Management RN Telephonic Health Coach [phone]

## 2015-07-08 ENCOUNTER — Encounter: Payer: Self-pay | Admitting: Internal Medicine

## 2015-07-08 ENCOUNTER — Ambulatory Visit (INDEPENDENT_AMBULATORY_CARE_PROVIDER_SITE_OTHER): Payer: Medicare Other | Admitting: Internal Medicine

## 2015-07-08 ENCOUNTER — Other Ambulatory Visit (INDEPENDENT_AMBULATORY_CARE_PROVIDER_SITE_OTHER): Payer: Medicare Other

## 2015-07-08 VITALS — BP 168/80 | HR 72 | Temp 98.2°F | Resp 16 | Ht 68.0 in | Wt 220.0 lb

## 2015-07-08 DIAGNOSIS — K2100 Gastro-esophageal reflux disease with esophagitis, without bleeding: Secondary | ICD-10-CM

## 2015-07-08 DIAGNOSIS — E038 Other specified hypothyroidism: Secondary | ICD-10-CM | POA: Diagnosis not present

## 2015-07-08 DIAGNOSIS — I481 Persistent atrial fibrillation: Secondary | ICD-10-CM | POA: Diagnosis not present

## 2015-07-08 DIAGNOSIS — E118 Type 2 diabetes mellitus with unspecified complications: Secondary | ICD-10-CM

## 2015-07-08 DIAGNOSIS — I519 Heart disease, unspecified: Secondary | ICD-10-CM

## 2015-07-08 DIAGNOSIS — I4819 Other persistent atrial fibrillation: Secondary | ICD-10-CM

## 2015-07-08 DIAGNOSIS — I10 Essential (primary) hypertension: Secondary | ICD-10-CM

## 2015-07-08 DIAGNOSIS — J4520 Mild intermittent asthma, uncomplicated: Secondary | ICD-10-CM

## 2015-07-08 DIAGNOSIS — I5189 Other ill-defined heart diseases: Secondary | ICD-10-CM

## 2015-07-08 DIAGNOSIS — R600 Localized edema: Secondary | ICD-10-CM

## 2015-07-08 LAB — URINALYSIS, ROUTINE W REFLEX MICROSCOPIC
BILIRUBIN URINE: NEGATIVE
Hgb urine dipstick: NEGATIVE
Ketones, ur: NEGATIVE
NITRITE: NEGATIVE
PH: 6 (ref 5.0–8.0)
RBC / HPF: NONE SEEN (ref 0–?)
Specific Gravity, Urine: 1.01 (ref 1.000–1.030)
TOTAL PROTEIN, URINE-UPE24: NEGATIVE
Urine Glucose: NEGATIVE
Urobilinogen, UA: 0.2 (ref 0.0–1.0)

## 2015-07-08 LAB — CBC WITH DIFFERENTIAL/PLATELET
BASOS ABS: 0 10*3/uL (ref 0.0–0.1)
Basophils Relative: 0.5 % (ref 0.0–3.0)
EOS ABS: 0.5 10*3/uL (ref 0.0–0.7)
Eosinophils Relative: 6.6 % — ABNORMAL HIGH (ref 0.0–5.0)
HCT: 36.5 % (ref 36.0–46.0)
Hemoglobin: 12.4 g/dL (ref 12.0–15.0)
LYMPHS ABS: 1.4 10*3/uL (ref 0.7–4.0)
Lymphocytes Relative: 19.1 % (ref 12.0–46.0)
MCHC: 33.9 g/dL (ref 30.0–36.0)
MCV: 89.3 fl (ref 78.0–100.0)
MONO ABS: 0.7 10*3/uL (ref 0.1–1.0)
Monocytes Relative: 9.3 % (ref 3.0–12.0)
NEUTROS PCT: 64.5 % (ref 43.0–77.0)
Neutro Abs: 4.9 10*3/uL (ref 1.4–7.7)
Platelets: 308 10*3/uL (ref 150.0–400.0)
RBC: 4.08 Mil/uL (ref 3.87–5.11)
RDW: 14.5 % (ref 11.5–15.5)
WBC: 7.6 10*3/uL (ref 4.0–10.5)

## 2015-07-08 LAB — HEMOGLOBIN A1C: HEMOGLOBIN A1C: 5.9 % (ref 4.6–6.5)

## 2015-07-08 LAB — BASIC METABOLIC PANEL
BUN: 16 mg/dL (ref 6–23)
CALCIUM: 9.6 mg/dL (ref 8.4–10.5)
CHLORIDE: 105 meq/L (ref 96–112)
CO2: 30 meq/L (ref 19–32)
Creatinine, Ser: 0.74 mg/dL (ref 0.40–1.20)
GFR: 80.92 mL/min (ref >60.00)
Glucose, Bld: 118 mg/dL — ABNORMAL HIGH (ref 70–99)
POTASSIUM: 4.2 meq/L (ref 3.5–5.1)
SODIUM: 139 meq/L (ref 135–145)

## 2015-07-08 LAB — MICROALBUMIN / CREATININE URINE RATIO
Creatinine,U: 55.7 mg/dL
MICROALB/CREAT RATIO: 1.6 mg/g (ref 0.0–30.0)
Microalb, Ur: 0.9 mg/dL (ref 0.0–1.9)

## 2015-07-08 LAB — TSH: TSH: 2.94 u[IU]/mL (ref 0.35–4.50)

## 2015-07-08 MED ORDER — RIVAROXABAN 15 MG PO TABS: 15.0000 mg | ORAL_TABLET | Freq: Every day | ORAL | Status: DC

## 2015-07-08 MED ORDER — MONTELUKAST SODIUM 10 MG PO TABS: 10.0000 mg | ORAL_TABLET | Freq: Every day | ORAL | Status: DC

## 2015-07-08 MED ORDER — TORSEMIDE 20 MG PO TABS: 20.0000 mg | ORAL_TABLET | Freq: Two times a day (BID) | ORAL | Status: DC

## 2015-07-08 MED ORDER — ESOMEPRAZOLE MAGNESIUM 40 MG PO CPDR: 40.0000 mg | DELAYED_RELEASE_CAPSULE | Freq: Two times a day (BID) | ORAL | Status: DC

## 2015-07-08 MED ORDER — TELMISARTAN 80 MG PO TABS: 80.0000 mg | ORAL_TABLET | Freq: Every day | ORAL | Status: AC

## 2015-07-08 MED ORDER — ALBUTEROL SULFATE (2.5 MG/3ML) 0.083% IN NEBU: 2.5000 mg | INHALATION_SOLUTION | Freq: Four times a day (QID) | RESPIRATORY_TRACT | Status: DC | PRN

## 2015-07-08 MED ORDER — LEVOTHYROXINE SODIUM 50 MCG PO TABS: ORAL_TABLET | ORAL | Status: DC

## 2015-07-08 MED ORDER — METHYLPREDNISOLONE 4 MG PO TBPK: ORAL_TABLET | ORAL | Status: DC

## 2015-07-08 NOTE — Progress Notes (Signed)
Pre visit review using our clinic review tool, if applicable. No additional management support is needed unless otherwise documented below in the visit note. 

## 2015-07-08 NOTE — Patient Instructions (Signed)
Hypertension Hypertension, commonly called high blood pressure, is when the force of blood pumping through your arteries is too strong. Your arteries are the blood vessels that carry blood from your heart throughout your body. A blood pressure reading consists of a higher number over a lower number, such as 110/72. The higher number (systolic) is the pressure inside your arteries when your heart pumps. The lower number (diastolic) is the pressure inside your arteries when your heart relaxes. Ideally you want your blood pressure below 120/80. Hypertension forces your heart to work harder to pump blood. Your arteries may become narrow or stiff. Having untreated or uncontrolled hypertension can cause heart attack, stroke, kidney disease, and other problems. RISK FACTORS Some risk factors for high blood pressure are controllable. Others are not.  Risk factors you cannot control include:   Race. You may be at higher risk if you are African American.  Age. Risk increases with age.  Gender. Men are at higher risk than women before age 45 years. After age 65, women are at higher risk than men. Risk factors you can control include:  Not getting enough exercise or physical activity.  Being overweight.  Getting too much fat, sugar, calories, or salt in your diet.  Drinking too much alcohol. SIGNS AND SYMPTOMS Hypertension does not usually cause signs or symptoms. Extremely high blood pressure (hypertensive crisis) may cause headache, anxiety, shortness of breath, and nosebleed. DIAGNOSIS To check if you have hypertension, your health care provider will measure your blood pressure while you are seated, with your arm held at the level of your heart. It should be measured at least twice using the same arm. Certain conditions can cause a difference in blood pressure between your right and left arms. A blood pressure reading that is higher than normal on one occasion does not mean that you need treatment. If  it is not clear whether you have high blood pressure, you may be asked to return on a different day to have your blood pressure checked again. Or, you may be asked to monitor your blood pressure at home for 1 or more weeks. TREATMENT Treating high blood pressure includes making lifestyle changes and possibly taking medicine. Living a healthy lifestyle can help lower high blood pressure. You may need to change some of your habits. Lifestyle changes may include:  Following the DASH diet. This diet is high in fruits, vegetables, and whole grains. It is low in salt, red meat, and added sugars.  Keep your sodium intake below 2,300 mg per day.  Getting at least 30-45 minutes of aerobic exercise at least 4 times per week.  Losing weight if necessary.  Not smoking.  Limiting alcoholic beverages.  Learning ways to reduce stress. Your health care provider may prescribe medicine if lifestyle changes are not enough to get your blood pressure under control, and if one of the following is true:  You are 18-59 years of age and your systolic blood pressure is above 140.  You are 60 years of age or older, and your systolic blood pressure is above 150.  Your diastolic blood pressure is above 90.  You have diabetes, and your systolic blood pressure is over 140 or your diastolic blood pressure is over 90.  You have kidney disease and your blood pressure is above 140/90.  You have heart disease and your blood pressure is above 140/90. Your personal target blood pressure may vary depending on your medical conditions, your age, and other factors. HOME CARE INSTRUCTIONS    Have your blood pressure rechecked as directed by your health care provider.   Take medicines only as directed by your health care provider. Follow the directions carefully. Blood pressure medicines must be taken as prescribed. The medicine does not work as well when you skip doses. Skipping doses also puts you at risk for  problems.  Do not smoke.   Monitor your blood pressure at home as directed by your health care provider. SEEK MEDICAL CARE IF:   You think you are having a reaction to medicines taken.  You have recurrent headaches or feel dizzy.  You have swelling in your ankles.  You have trouble with your vision. SEEK IMMEDIATE MEDICAL CARE IF:  You develop a severe headache or confusion.  You have unusual weakness, numbness, or feel faint.  You have severe chest or abdominal pain.  You vomit repeatedly.  You have trouble breathing. MAKE SURE YOU:   Understand these instructions.  Will watch your condition.  Will get help right away if you are not doing well or get worse.   This information is not intended to replace advice given to you by your health care provider. Make sure you discuss any questions you have with your health care provider.   Document Released: 04/11/2005 Document Revised: 08/26/2014 Document Reviewed: 02/01/2013 Elsevier Interactive Patient Education 2016 Elsevier Inc.  

## 2015-07-08 NOTE — Progress Notes (Signed)
Subjective:  Patient ID: [name] [name], female    DOB: 1940/11/22  Age: 75 y.o. MRN: [id number]  CC: Cough and Hypertension   HPI [name] [name] presents for the complaint of a nonproductive cough for about 5 weeks. She also complains of wheezing, shortness of breath and worsening edema in her lower extremity. She denies fevers, chills, night sweats, hemoptysis, chest pain, nausea, or vomiting. She complains of fatigue. She is getting some symptom relief with the use of albuterol inhaler and Symbicort.   Outpatient Prescriptions Prior to Visit  Medication Sig Dispense Refill  . albuterol (PROAIR HFA) 108 (90 BASE) MCG/ACT inhaler Inhale 2 puffs into the lungs every 6 (six) hours as needed for wheezing or shortness of breath. 8.5 each 11  . budesonide-formoterol (SYMBICORT) 160-4.5 MCG/ACT inhaler Inhale 2 puffs into the lungs 2 (two) times daily. 1 Inhaler 11  . fluticasone (FLONASE) 50 MCG/ACT nasal spray Place 2 sprays into both nostrils daily. 48 g 3  . loratadine (CLARITIN) 10 MG tablet Take 10 mg by mouth daily.    . metoprolol tartrate (LOPRESSOR) 25 MG tablet TAKE 1 TABLET (25 MG TOTAL) BY MOUTH 2 (TWO) TIMES DAILY. 60 tablet 2  . sodium chloride (OCEAN) 0.65 % SOLN nasal spray Place 2 sprays into both nostrils 4 (four) times daily. (Patient taking differently: Place 2 sprays into both nostrils 2 (two) times daily. ) 90 mL 0  . tolterodine (DETROL LA) 4 MG 24 hr capsule Take 1 capsule (4 mg total) by mouth daily. 90 capsule 3  . esomeprazole (NEXIUM) 40 MG capsule Take 1 capsule (40 mg total) by mouth 2 (two) times daily before a meal. 180 capsule 3  . levothyroxine (SYNTHROID) 50 MCG tablet 1 before breakfast EXCEPT 1/2 on Tues & Thurs Keep march appt for future refills 30 tablet 0  . lisinopril (PRINIVIL,ZESTRIL) 20 MG tablet Take 1 tablet (20 mg total) by mouth daily. Must keep march appr for future refills 30 tablet 0  . montelukast (SINGULAIR) 10 MG tablet Take 1  tablet (10 mg total) by mouth at bedtime. 90 tablet 3  . Rivaroxaban (XARELTO) 15 MG TABS tablet Take 1 tablet (15 mg total) by mouth daily with supper. Keep 07/08/15 appt for future refills 30 tablet 0  . torsemide (DEMADEX) 20 MG tablet Take 1 tablet (20 mg total) by mouth daily. 90 tablet 1   No facility-administered medications prior to visit.    ROS Review of Systems  Constitutional: Positive for fatigue. Negative for fever, chills, diaphoresis, activity change, appetite change and unexpected weight change.  HENT: Negative.  Negative for congestion, facial swelling, sinus pressure, sore throat and trouble swallowing.   Eyes: Negative.  Negative for visual disturbance.  Respiratory: Positive for cough, shortness of breath and wheezing. Negative for choking, chest tightness and stridor.   Cardiovascular: Positive for leg swelling. Negative for chest pain and palpitations.  Gastrointestinal: Negative.  Negative for nausea, vomiting, abdominal pain, diarrhea, constipation and blood in stool.  Endocrine: Negative.   Genitourinary: Negative.   Musculoskeletal: Negative.  Negative for myalgias, back pain, joint swelling and arthralgias.  Skin: Negative.   Allergic/Immunologic: Negative.   Neurological: Negative.  Negative for dizziness, tremors, weakness, light-headedness and numbness.  Hematological: Negative.  Negative for adenopathy. Does not bruise/bleed easily.  Psychiatric/Behavioral: Negative.     Objective:  BP 168/80 mmHg  Pulse 72  Temp(Src) 98.2 F (36.8 C) (Oral)  Resp 16  Ht 5\' 8"  (1.727 m)  Wt 220  lb (99.791 kg)  BMI 33.46 kg/m2  SpO2 95%  LMP 12/01/2010  BP Readings from Last 3 Encounters:  07/08/15 168/80  04/09/15 138/78  04/06/15 140/66    Wt Readings from Last 3 Encounters:  07/08/15 220 lb (99.791 kg)  04/09/15 206 lb (93.441 kg)  04/02/15 200 lb 9.9 oz (91 kg)    Physical Exam  Constitutional: She is oriented to person, place, and time. She appears  well-developed and well-nourished.  Non-toxic appearance. She does not have a sickly appearance. She does not appear ill. No distress.  HENT:  Head: Normocephalic and atraumatic.  Mouth/Throat: Oropharynx is clear and moist. No oropharyngeal exudate.  Eyes: Conjunctivae are normal. Right eye exhibits no discharge. Left eye exhibits no discharge. No scleral icterus.  Neck: Normal range of motion. Neck supple. No JVD present. No tracheal deviation present. No thyromegaly present.  Cardiovascular: Normal rate, normal heart sounds and intact distal pulses.  An irregularly irregular rhythm present. Exam reveals no gallop and no friction rub.   No murmur heard. Pulmonary/Chest: Effort normal. No stridor. No respiratory distress. She has wheezes in the right middle field, the right lower field, the left middle field and the left lower field. She has no rhonchi. She has no rales. She exhibits no tenderness.  She has good air movement but there is bilateral late inspiratory wheezing on both sides  Abdominal: Soft. Bowel sounds are normal. She exhibits no distension and no mass. There is no tenderness. There is no rebound and no guarding.  Musculoskeletal: Normal range of motion. She exhibits edema (2+ pitting edema in bilateral lower extremities). She exhibits no tenderness.  Lymphadenopathy:    She has no cervical adenopathy.  Neurological: She is oriented to person, place, and time.  Skin: Skin is warm and dry. No rash noted. She is not diaphoretic. No erythema. No pallor.  Psychiatric: She has a normal mood and affect. Her behavior is normal. Judgment and thought content normal.  Vitals reviewed.   Lab Results  Component Value Date   WBC 7.6 07/08/2015   HGB 12.4 07/08/2015   HCT 36.5 07/08/2015   PLT 308.0 07/08/2015   GLUCOSE 118* 07/08/2015   CHOL 208* 06/19/2014   TRIG 100.0 06/19/2014   HDL 61.70 06/19/2014   LDLCALC 126* 06/19/2014   ALT 20 03/30/2015   AST 29 03/30/2015   NA 139  07/08/2015   K 4.2 07/08/2015   CL 105 07/08/2015   CREATININE 0.74 07/08/2015   BUN 16 07/08/2015   CO2 30 07/08/2015   TSH 2.94 07/08/2015   INR 2.10* 04/02/2015   HGBA1C 5.9 07/08/2015   MICROALBUR 0.9 07/08/2015    Dg Chest 2 View  03/30/2015  CLINICAL DATA:  Cough, shortness of breath, fever EXAM: CHEST  2 VIEW COMPARISON:  01/20/2015 FINDINGS: The heart size and mediastinal contours are within normal limits. Both lungs are clear. The visualized skeletal structures are unremarkable. IMPRESSION: No active cardiopulmonary disease. Electronically Signed   By: Charlett Nose M.D.   On: 03/30/2015 13:39    Assessment & Plan:   Jailee was seen today for cough and hypertension.  Diagnoses and all orders for this visit:  Gastroesophageal reflux disease with esophagitis -     esomeprazole (NEXIUM) 40 MG capsule; Take 1 capsule (40 mg total) by mouth 2 (two) times daily before a meal. -     CBC with Differential/Platelet; Future  Asthma, mild intermittent, uncomplicated- she is having a moderately severe flare of asthma so I  refilled all of her usual medications and asked her to continue them. She has a nebulizer at home but she tells me his many years old and doesn't work anymore. I sent a prescription to her pharmacy for albuterol to be used her nebulizer and of asked to home health care company to visit her to provide her with a nebulizer and help her use it. For now since she is having a flareup prescribed a course of Medrol Dosepak to reduce the wheezing. -     montelukast (SINGULAIR) 10 MG tablet; Take 1 tablet (10 mg total) by mouth at bedtime. -     albuterol (PROVENTIL) (2.5 MG/3ML) 0.083% nebulizer solution; Take 3 mLs (2.5 mg total) by nebulization every 6 (six) hours as needed for wheezing or shortness of breath. -     methylPREDNISolone (MEDROL DOSEPAK) 4 MG TBPK tablet; TAKE AS DIRECTED -     Ambulatory referral to Home Health  Persistent atrial fibrillation (HCC)- she has  good rate control, will continue Xarelto -     Rivaroxaban (XARELTO) 15 MG TABS tablet; Take 1 tablet (15 mg total) by mouth daily with supper. -     TSH; Future  Other specified hypothyroidism- her TSH is in the normal range, she will remain on the current dose of Synthroid -     TSH; Future -     levothyroxine (SYNTHROID) 50 MCG tablet; 1 before breakfast QD  Essential hypertension- she has a chronic cough side of asked her to stop the ACE inhibitor, will change to an ARB. Her electrolytes and renal function are stable. -     telmisartan (MICARDIS) 80 MG tablet; Take 1 tablet (80 mg total) by mouth daily. -     Basic metabolic panel; Future -     torsemide (DEMADEX) 20 MG tablet; Take 1 tablet (20 mg total) by mouth 2 (two) times daily. -     Urinalysis, Routine w reflex microscopic (not at [facility]); Future  Diastolic dysfunction- she has worsening edema so I've increase the Demadex 20 mg once a day to 20 mg twice a day. She also does not have very good blood pressure control so will change the ACE inhibitor to an ARB -     telmisartan (MICARDIS) 80 MG tablet; Take 1 tablet (80 mg total) by mouth daily. -     torsemide (DEMADEX) 20 MG tablet; Take 1 tablet (20 mg total) by mouth 2 (two) times daily. -     Ambulatory referral to Home Health  Type 2 diabetes mellitus with complication, without long-term current use of insulin (HCC)- her blood sugars are well-controlled -     Hemoglobin A1c; Future -     Microalbumin / creatinine urine ratio; Future  Localized edema- there are multiple factors contributing to this including a sedentary lifestyle, sodium intake, obesity, and diastolic dysfunction. Since her symptoms have worsened I will increase her Demadex to twice a day. -     torsemide (DEMADEX) 20 MG tablet; Take 1 tablet (20 mg total) by mouth 2 (two) times daily. -     Ambulatory referral to Home Health  Other orders -     Cancel: lisinopril (PRINIVIL,ZESTRIL) 20 MG tablet; Take 1 tablet  (20 mg total) by mouth daily. Must keep march appr for future refills -     Cancel: levothyroxine (SYNTHROID) 50 MCG tablet; 1 before breakfast EXCEPT 1/2 on Tues & Thurs Keep march appt for future refills  I have discontinued Ms. [name]'s lisinopril. I  have also changed her Rivaroxaban, torsemide, and levothyroxine. Additionally, I am having her start on telmisartan, albuterol, and methylPREDNISolone. Lastly, I am having her maintain her loratadine, sodium chloride, albuterol, fluticasone, tolterodine, budesonide-formoterol, metoprolol tartrate, esomeprazole, and montelukast.  Meds ordered this encounter  Medications  . esomeprazole (NEXIUM) 40 MG capsule    Sig: Take 1 capsule (40 mg total) by mouth 2 (two) times daily before a meal.    Dispense:  180 capsule    Refill:  3  . montelukast (SINGULAIR) 10 MG tablet    Sig: Take 1 tablet (10 mg total) by mouth at bedtime.    Dispense:  90 tablet    Refill:  3  . Rivaroxaban (XARELTO) 15 MG TABS tablet    Sig: Take 1 tablet (15 mg total) by mouth daily with supper.    Dispense:  30 tablet    Refill:  0  . telmisartan (MICARDIS) 80 MG tablet    Sig: Take 1 tablet (80 mg total) by mouth daily.    Dispense:  90 tablet    Refill:  3  . albuterol (PROVENTIL) (2.5 MG/3ML) 0.083% nebulizer solution    Sig: Take 3 mLs (2.5 mg total) by nebulization every 6 (six) hours as needed for wheezing or shortness of breath.    Dispense:  150 mL    Refill:  1  . methylPREDNISolone (MEDROL DOSEPAK) 4 MG TBPK tablet    Sig: TAKE AS DIRECTED    Dispense:  21 tablet    Refill:  0  . torsemide (DEMADEX) 20 MG tablet    Sig: Take 1 tablet (20 mg total) by mouth 2 (two) times daily.    Dispense:  180 tablet    Refill:  1  . levothyroxine (SYNTHROID) 50 MCG tablet    Sig: 1 before breakfast QD    Dispense:  30 tablet    Refill:  5     Follow-up: Return in about 3 months (around 10/08/2015).  Sanda Linger, MD

## 2015-07-14 ENCOUNTER — Telehealth: Payer: Self-pay | Admitting: Internal Medicine

## 2015-07-14 ENCOUNTER — Other Ambulatory Visit: Payer: Self-pay | Admitting: Internal Medicine

## 2015-07-14 DIAGNOSIS — E038 Other specified hypothyroidism: Secondary | ICD-10-CM

## 2015-07-14 DIAGNOSIS — J4520 Mild intermittent asthma, uncomplicated: Secondary | ICD-10-CM

## 2015-07-14 MED ORDER — LEVOTHYROXINE SODIUM 50 MCG PO TABS: 50.0000 ug | ORAL_TABLET | Freq: Every day | ORAL | Status: DC

## 2015-07-14 NOTE — Telephone Encounter (Signed)
[facility] to be fax to them at [phone]. He said that they have the order but then need written Rx to go with it. Please help

## 2015-07-14 NOTE — Telephone Encounter (Signed)
Generated DME for nebulizer fax back to # below...[name]/lmb

## 2015-07-14 NOTE — Telephone Encounter (Signed)
Can you help with this?

## 2015-07-15 ENCOUNTER — Other Ambulatory Visit: Payer: Self-pay

## 2015-07-15 NOTE — Patient Outreach (Signed)
[facility]) Care Management  07/15/2015  [name] 1940-07-20 [id number]   Telephone call to patient to update on the status of nebulizer machine and medication. Advised patient that nebulizer machine has dispatched to driver for delivery and that medication for nebulizer is ready at [facility] and will cost 38.95.  She verbalized understanding and thanked Control and instrumentation engineerhealth coach for assistance.    Bary Lericheionne J Tina Gruner, RN, MSN [facility] Care Management RN Telephonic Health Coach [phone]

## 2015-07-15 NOTE — Patient Outreach (Addendum)
[facility]) Care Management  [facility]  07/15/2015   Jackelyn PolingMargaret M Lozier-Arps Aug 26, 1940 [id number]  Subjective: Telephone call to patient for monthly call. Patient reports she is having some allergy related wheezing and shortness of breath.  Patient reports she is using Claritin and Flonase. She also reports more frequent use of inhaler. She states that she is waiting for nebulizer medication but has not heard anything yet.  Noted that [facility] has been involved and order is in process.  Health Coach will contact [facility] to find out status.  Discussed with patient COPD action plan and when to notify physician.  She verbalized understanding. Patient reports that she also had some swelling to her legs and that Dr. Yetta BarreJones was aware.  She states that she has been using elastic stockings and keeping her feet up and this has helped.    Objective:   Current Medications:  Current Outpatient Prescriptions  Medication Sig Dispense Refill  . albuterol (PROAIR HFA) 108 (90 BASE) MCG/ACT inhaler Inhale 2 puffs into the lungs every 6 (six) hours as needed for wheezing or shortness of breath. 8.5 each 11  . albuterol (PROVENTIL) (2.5 MG/3ML) 0.083% nebulizer solution Take 3 mLs (2.5 mg total) by nebulization every 6 (six) hours as needed for wheezing or shortness of breath. 150 mL 1  . budesonide-formoterol (SYMBICORT) 160-4.5 MCG/ACT inhaler Inhale 2 puffs into the lungs 2 (two) times daily. 1 Inhaler 11  . esomeprazole (NEXIUM) 40 MG capsule Take 1 capsule (40 mg total) by mouth 2 (two) times daily before a meal. 180 capsule 3  . fluticasone (FLONASE) 50 MCG/ACT nasal spray Place 2 sprays into both nostrils daily. 48 g 3  . levothyroxine (SYNTHROID) 50 MCG tablet Take 1 tablet (50 mcg total) by mouth daily before breakfast. 90 tablet 3  . loratadine (CLARITIN) 10 MG tablet Take 10 mg by mouth daily.    . methylPREDNISolone (MEDROL DOSEPAK) 4 MG TBPK tablet TAKE AS DIRECTED 21  tablet 0  . metoprolol tartrate (LOPRESSOR) 25 MG tablet TAKE 1 TABLET (25 MG TOTAL) BY MOUTH 2 (TWO) TIMES DAILY. 60 tablet 2  . montelukast (SINGULAIR) 10 MG tablet Take 1 tablet (10 mg total) by mouth at bedtime. 90 tablet 3  . Rivaroxaban (XARELTO) 15 MG TABS tablet Take 1 tablet (15 mg total) by mouth daily with supper. 30 tablet 0  . sodium chloride (OCEAN) 0.65 % SOLN nasal spray Place 2 sprays into both nostrils 4 (four) times daily. (Patient taking differently: Place 2 sprays into both nostrils 2 (two) times daily. ) 90 mL 0  . telmisartan (MICARDIS) 80 MG tablet Take 1 tablet (80 mg total) by mouth daily. 90 tablet 3  . tolterodine (DETROL LA) 4 MG 24 hr capsule Take 1 capsule (4 mg total) by mouth daily. 90 capsule 3  . torsemide (DEMADEX) 20 MG tablet Take 1 tablet (20 mg total) by mouth 2 (two) times daily. 180 tablet 1   No current facility-administered medications for this visit.    Functional Status:  In your present state of health, do you have any difficulty performing the following activities: 06/19/2015 04/06/2015  Hearing? N -  Vision? N -  Difficulty concentrating or making decisions? Y (No Data)  Walking or climbing stairs? Y -  Dressing or bathing? N -  Doing errands, shopping? Y -  Quarry managerreparing Food and eating ? Y -  Using the Toilet? N -  In the past six months, have you accidently  leaked urine? Y -  Do you have problems with loss of bowel control? N -  Managing your Medications? Y -  Managing your Finances? Y -  Housekeeping or managing your Housekeeping? Y -    Fall/Depression Screening: PHQ 2/9 Scores 07/15/2015 06/19/2015 04/06/2015 02/06/2015 04/12/2013  PHQ - 2 Score 0 0 0 3 0  PHQ- 9 Score - - - 7 -    Assessment: Patient continues to benefit from health coach outreach for disease management and support.  Plan:  [facility] CM Care Plan Problem One        Most Recent Value   Care Plan Problem One  Knowledge deficit COPD   Role Documenting the Problem One   Health Coach   Care Plan for Problem One  Active   [facility] Long Term Goal (31-90 days)  Patient will continue to utilize COPD action plan as part of daily management within the next 90 days.    [facility] Long Term Goal Start Date  06/19/15   Interventions for Problem One Long Term Goal  RN Health Coach discussed with patient COPD action plan and encouraged to continue to utilize it for self-management.  Also discussed with patient when to notify physician.    [facility] CM Short Term Goal #2 (0-30 days)  Patient will identify at least 4 warning signs of COPD/asthma flare up and when to call the doctor for help in the next 30 days   [facility] CM Short Term Goal #2 Start Date  07/15/15 Staci Righter continued]   Interventions for Short Term Goal #2  RN Health Coach discussed with patient yellow zone of COPD action plan.      RN Health Coach will contacted [facility] and nebulizer to be delivered today.  RN Health Coach will contact patient within one month and patient agrees to next outreach.    Bary Leriche, RN, MSN [facility] Care Management RN Telephonic Health Coach [phone]

## 2015-07-16 ENCOUNTER — Other Ambulatory Visit: Payer: Self-pay

## 2015-07-16 DIAGNOSIS — J301 Allergic rhinitis due to pollen: Secondary | ICD-10-CM

## 2015-07-16 DIAGNOSIS — J4520 Mild intermittent asthma, uncomplicated: Secondary | ICD-10-CM

## 2015-07-16 MED ORDER — FLUTICASONE PROPIONATE 50 MCG/ACT NA SUSP: 2.0000 | Freq: Every day | NASAL | Status: AC

## 2015-07-16 MED ORDER — ALBUTEROL SULFATE HFA 108 (90 BASE) MCG/ACT IN AERS: 2.0000 | INHALATION_SPRAY | Freq: Four times a day (QID) | RESPIRATORY_TRACT | Status: DC | PRN

## 2015-08-06 ENCOUNTER — Other Ambulatory Visit: Payer: Self-pay

## 2015-08-06 NOTE — Patient Outreach (Signed)
[facility]) Care Management  08/06/2015  [name] 07/11/1940 [id number]   Telephone call to patient for monthly outreach.  Patient reports she is busy right now and asked for a call back.  Plan: RN Health Coach will contact patient within 1-2 weeks.  Bary Lericheionne J Kayde Atkerson, RN, MSN [facility] Care Management RN Telephonic Health Coach [phone]

## 2015-08-10 ENCOUNTER — Telehealth: Payer: Self-pay

## 2015-08-10 NOTE — Telephone Encounter (Signed)
Faxed order for nebulizer supplies with PCP sign to [facility]

## 2015-08-18 ENCOUNTER — Other Ambulatory Visit: Payer: Self-pay

## 2015-08-18 NOTE — Patient Outreach (Signed)
[facility]) Care Management  [place]  08/18/2015   [name] [name] 03-31-1941 [id number]  Subjective: Telephone call to patient for monthly call.  Patient reports she is doing good.  Patient reports minimal problems with shortness of breath.  Patient thinks she is ready for program closure as she is able to manage from day to day with her COPD.  Discussed with patient COPD action plan and utilizing it as a guide.  She verbalized understanding and able to verbalize signs and symptoms of exacerbation.  No concerns.    Objective:   Encounter Medications:  Outpatient Encounter Prescriptions as of 08/18/2015  Medication Sig  . albuterol (PROAIR HFA) 108 (90 Base) MCG/ACT inhaler Inhale 2 puffs into the lungs every 6 (six) hours as needed for wheezing or shortness of breath.  . budesonide-formoterol (SYMBICORT) 160-4.5 MCG/ACT inhaler Inhale 2 puffs into the lungs 2 (two) times daily.  Marland Kitchen esomeprazole (NEXIUM) 40 MG capsule Take 1 capsule (40 mg total) by mouth 2 (two) times daily before a meal.  . fluticasone (FLONASE) 50 MCG/ACT nasal spray Place 2 sprays into both nostrils daily.  Marland Kitchen levothyroxine (SYNTHROID) 50 MCG tablet Take 1 tablet (50 mcg total) by mouth daily before breakfast.  . loratadine (CLARITIN) 10 MG tablet Take 10 mg by mouth daily.  . methylPREDNISolone (MEDROL DOSEPAK) 4 MG TBPK tablet TAKE AS DIRECTED  . metoprolol tartrate (LOPRESSOR) 25 MG tablet TAKE 1 TABLET (25 MG TOTAL) BY MOUTH 2 (TWO) TIMES DAILY.  . montelukast (SINGULAIR) 10 MG tablet Take 1 tablet (10 mg total) by mouth at bedtime.  . Rivaroxaban (XARELTO) 15 MG TABS tablet Take 1 tablet (15 mg total) by mouth daily with supper.  . sodium chloride (OCEAN) 0.65 % SOLN nasal spray Place 2 sprays into both nostrils 4 (four) times daily. (Patient taking differently: Place 2 sprays into both nostrils 2 (two) times daily. )  . telmisartan (MICARDIS) 80 MG tablet Take 1 tablet (80 mg total)  by mouth daily.  Marland Kitchen tolterodine (DETROL LA) 4 MG 24 hr capsule Take 1 capsule (4 mg total) by mouth daily.  Marland Kitchen torsemide (DEMADEX) 20 MG tablet Take 1 tablet (20 mg total) by mouth 2 (two) times daily.  Marland Kitchen albuterol (PROVENTIL) (2.5 MG/3ML) 0.083% nebulizer solution Take 3 mLs (2.5 mg total) by nebulization every 6 (six) hours as needed for wheezing or shortness of breath.   No facility-administered encounter medications on file as of 08/18/2015.    Functional Status:  In your present state of health, do you have any difficulty performing the following activities: 06/19/2015 04/06/2015  Hearing? N -  Vision? N -  Difficulty concentrating or making decisions? Y (No Data)  Walking or climbing stairs? Y -  Dressing or bathing? N -  Doing errands, shopping? Y -  Conservation officer, nature and eating ? Y -  Using the Toilet? N -  In the past six months, have you accidently leaked urine? Y -  Do you have problems with loss of bowel control? N -  Managing your Medications? Y -  Managing your Finances? Y -  Housekeeping or managing your Housekeeping? Y -    Fall/Depression Screening: PHQ 2/9 Scores 07/15/2015 06/19/2015 04/06/2015 02/06/2015 04/12/2013  PHQ - 2 Score 0 0 0 3 0  PHQ- 9 Score - - - 7 -    Assessment: Patient has met care plan goals and is able to self manage COPD.   Plan:  [facility] CM Care Plan Problem One  Most Recent Value   Care Plan Problem One  Knowledge deficit COPD   Role Documenting the Problem One  [facility] [place] for Problem One  Active   [facility] Long Term Goal (31-90 days)  Patient will continue to utilize COPD action plan as part of daily management within the next 90 days.    [facility] Long Term Goal Start Date  06/19/15   [facility] Long Term Goal Met Date  08/18/15 [patient met goals]   Interventions for Problem One Long Term Goal  Patient able to discuss COPD action plan and when to notify physician.     [facility] CM Short Term Goal #2 (0-30 days)  Patient will identify at least 4  warning signs of COPD/asthma flare up and when to call the doctor for help in the next 30 days   [facility] CM Short Term Goal #2 Start Date  07/15/15 [goal continued]   [facility] CM Short Term Goal #2 Met Date  08/18/15   Interventions for Short Term Goal #2  Patient able to name signs and symptoms of COPD exacerbation.      RN Health Coach will send closure letter to patient and physician. RN Health Coach will notify care management assistant of case closure.  Jone Baseman, RN, MSN [place] [phone]

## 2015-10-08 ENCOUNTER — Encounter: Payer: Self-pay | Admitting: Internal Medicine

## 2015-10-08 ENCOUNTER — Ambulatory Visit (INDEPENDENT_AMBULATORY_CARE_PROVIDER_SITE_OTHER): Payer: Medicare Other | Admitting: Internal Medicine

## 2015-10-08 VITALS — BP 100/50 | HR 76 | Temp 98.6°F | Resp 16 | Ht 68.0 in | Wt 209.0 lb

## 2015-10-08 DIAGNOSIS — I10 Essential (primary) hypertension: Secondary | ICD-10-CM

## 2015-10-08 DIAGNOSIS — J4521 Mild intermittent asthma with (acute) exacerbation: Secondary | ICD-10-CM | POA: Diagnosis not present

## 2015-10-08 MED ORDER — METOPROLOL TARTRATE 25 MG PO TABS: ORAL_TABLET | ORAL | Status: DC

## 2015-10-08 MED ORDER — METHYLPREDNISOLONE 4 MG PO TBPK: ORAL_TABLET | ORAL | Status: DC

## 2015-10-08 MED ORDER — TIOTROPIUM BROMIDE MONOHYDRATE 1.25 MCG/ACT IN AERS: 2.0000 | INHALATION_SPRAY | Freq: Every day | RESPIRATORY_TRACT | Status: DC

## 2015-10-08 NOTE — Progress Notes (Signed)
Subjective:  Patient ID: [name] [name], female    DOB: 1941/03/08  Age: 75 y.o. MRN: [id number]  CC: Cough and Asthma   HPI [name] PolingMargaret M [name] presents for follow-up on asthma, and she tells me for the last 2-3 months she has had persistent cough productive of thin, clear phlegm with persistent wheezing and a few dizzy spells. She reports that she has been compliant with her Symbicort therapy but she is starting to demonstrate some signs of cognitive decline and lives alone. She denies chest pain, hemoptysis, fever, chills, night sweats.  Outpatient Prescriptions Prior to Visit  Medication Sig Dispense Refill  . albuterol (PROAIR HFA) 108 (90 Base) MCG/ACT inhaler Inhale 2 puffs into the lungs every 6 (six) hours as needed for wheezing or shortness of breath. 8.5 each 5  . albuterol (PROVENTIL) (2.5 MG/3ML) 0.083% nebulizer solution Take 3 mLs (2.5 mg total) by nebulization every 6 (six) hours as needed for wheezing or shortness of breath. 150 mL 1  . budesonide-formoterol (SYMBICORT) 160-4.5 MCG/ACT inhaler Inhale 2 puffs into the lungs 2 (two) times daily. 1 Inhaler 11  . esomeprazole (NEXIUM) 40 MG capsule Take 1 capsule (40 mg total) by mouth 2 (two) times daily before a meal. 180 capsule 3  . fluticasone (FLONASE) 50 MCG/ACT nasal spray Place 2 sprays into both nostrils daily. 48 g 1  . levothyroxine (SYNTHROID) 50 MCG tablet Take 1 tablet (50 mcg total) by mouth daily before breakfast. 90 tablet 3  . loratadine (CLARITIN) 10 MG tablet Take 10 mg by mouth daily.    . montelukast (SINGULAIR) 10 MG tablet Take 1 tablet (10 mg total) by mouth at bedtime. 90 tablet 3  . Rivaroxaban (XARELTO) 15 MG TABS tablet Take 1 tablet (15 mg total) by mouth daily with supper. 30 tablet 0  . sodium chloride (OCEAN) 0.65 % SOLN nasal spray Place 2 sprays into both nostrils 4 (four) times daily. (Patient taking differently: Place 2 sprays into both nostrils 2 (two) times daily. ) 90 mL 0  .  telmisartan (MICARDIS) 80 MG tablet Take 1 tablet (80 mg total) by mouth daily. 90 tablet 3  . tolterodine (DETROL LA) 4 MG 24 hr capsule Take 1 capsule (4 mg total) by mouth daily. 90 capsule 3  . torsemide (DEMADEX) 20 MG tablet Take 1 tablet (20 mg total) by mouth 2 (two) times daily. 180 tablet 1  . metoprolol tartrate (LOPRESSOR) 25 MG tablet TAKE 1 TABLET (25 MG TOTAL) BY MOUTH 2 (TWO) TIMES DAILY. 60 tablet 2  . methylPREDNISolone (MEDROL DOSEPAK) 4 MG TBPK tablet TAKE AS DIRECTED 21 tablet 0   No facility-administered medications prior to visit.    ROS Review of Systems  Constitutional: Negative.  Negative for fever, chills, diaphoresis, appetite change and fatigue.  HENT: Negative.   Eyes: Negative.   Respiratory: Positive for cough, shortness of breath and wheezing. Negative for apnea, choking, chest tightness and stridor.   Cardiovascular: Negative.  Negative for chest pain, palpitations and leg swelling.  Gastrointestinal: Negative.  Negative for nausea, vomiting, abdominal pain, diarrhea and constipation.  Endocrine: Negative.   Genitourinary: Negative.   Musculoskeletal: Negative.  Negative for myalgias, back pain, joint swelling and arthralgias.  Skin: Negative.  Negative for color change, pallor and rash.  Allergic/Immunologic: Negative.   Neurological: Positive for dizziness. Negative for tremors, syncope, light-headedness and numbness.  Hematological: Negative.  Negative for adenopathy. Does not bruise/bleed easily.  Psychiatric/Behavioral: Negative.     Objective:  BP  100/50 mmHg  Pulse 76  Temp(Src) 98.6 F (37 C) (Oral)  Resp 16  Ht  (1.727 m)  Wt 209 lb (94.802 kg)  BMI 31.79 kg/m2  SpO2 94%  LMP 12/01/2010  BP Readings from Last 3 Encounters:  10/08/15 100/50  07/08/15 168/80  04/09/15 138/78    Wt Readings from Last 3 Encounters:  10/08/15 209 lb (94.802 kg)  07/08/15 220 lb (99.791 kg)  04/09/15 206 lb (93.441 kg)    Physical Exam    Constitutional: She is oriented to person, place, and time. She appears well-developed and well-nourished.  Non-toxic appearance. She does not have a sickly appearance. She does not appear ill. No distress.  HENT:  Mouth/Throat: Oropharynx is clear and moist. No oropharyngeal exudate.  Eyes: Conjunctivae are normal. Right eye exhibits no discharge. Left eye exhibits no discharge. No scleral icterus.  Neck: Normal range of motion. Neck supple. No JVD present. No tracheal deviation present. No thyromegaly present.  Cardiovascular: Normal rate, regular rhythm, normal heart sounds and intact distal pulses.  Exam reveals no gallop and no friction rub.   No murmur heard. Pulmonary/Chest: Effort normal. No accessory muscle usage or stridor. No tachypnea. No respiratory distress. She has no decreased breath sounds. She has wheezes in the right upper field and the left upper field. She has no rhonchi. She has no rales. She exhibits no tenderness.  There are faint inspiratory and expiratory wheezes in bilateral upper zones, she has good air movement  Abdominal: Soft. Bowel sounds are normal. She exhibits no distension and no mass. There is no tenderness. There is no rebound and no guarding.  Musculoskeletal: Normal range of motion. She exhibits no edema or tenderness.  Lymphadenopathy:    She has no cervical adenopathy.  Neurological: She is oriented to person, place, and time.  Skin: Skin is warm and dry. No rash noted. She is not diaphoretic. No erythema. No pallor.  Vitals reviewed.   Lab Results  Component Value Date   WBC 7.6 07/08/2015   HGB 12.4 07/08/2015   HCT 36.5 07/08/2015   PLT 308.0 07/08/2015   GLUCOSE 118* 07/08/2015   CHOL 208* 06/19/2014   TRIG 100.0 06/19/2014   HDL 61.70 06/19/2014   LDLCALC 126* 06/19/2014   ALT 20 03/30/2015   AST 29 03/30/2015   NA 139 07/08/2015   K 4.2 07/08/2015   CL 105 07/08/2015   CREATININE 0.74 07/08/2015   BUN 16 07/08/2015   CO2 30  07/08/2015   TSH 2.94 07/08/2015   INR 2.10* 04/02/2015   HGBA1C 5.9 07/08/2015   MICROALBUR 0.9 07/08/2015    Dg Chest 2 View  03/30/2015  CLINICAL DATA:  Cough, shortness of breath, fever EXAM: CHEST  2 VIEW COMPARISON:  01/20/2015 FINDINGS: The heart size and mediastinal contours are within normal limits. Both lungs are clear. The visualized skeletal structures are unremarkable. IMPRESSION: No active cardiopulmonary disease. Electronically Signed   By: Charlett Nose M.D.   On: 03/30/2015 13:39    Assessment & Plan:   Minaal was seen today for cough and asthma.  Diagnoses and all orders for this visit:  Essential hypertension- her blood pressures adequately well-controlled -     metoprolol tartrate (LOPRESSOR) 25 MG tablet; TAKE 1 TABLET (25 MG TOTAL) BY MOUTH 2 (TWO) TIMES DAILY.  Asthma, mild intermittent, with acute exacerbation- she is having a flare of asthma so will start a course of systemic steroids, I've asked her to continue the LABA/ICS combination as  well as Singulair, will add an inhaled anti-cholinergic for additional relief of the asthma symptoms. -     Tiotropium Bromide Monohydrate (SPIRIVA RESPIMAT) 1.25 MCG/ACT AERS; Inhale 2 puffs into the lungs daily. -     methylPREDNISolone (MEDROL DOSEPAK) 4 MG TBPK tablet; TAKE AS DIRECTED -     Ambulatory referral to Pulmonology   I have discontinued Ms. [name]'s methylPREDNISolone. I am also having her start on Tiotropium Bromide Monohydrate and methylPREDNISolone. Additionally, I am having her maintain her loratadine, sodium chloride, tolterodine, budesonide-formoterol, esomeprazole, montelukast, Rivaroxaban, telmisartan, albuterol, torsemide, levothyroxine, fluticasone, albuterol, and metoprolol tartrate.  Meds ordered this encounter  Medications  . metoprolol tartrate (LOPRESSOR) 25 MG tablet    Sig: TAKE 1 TABLET (25 MG TOTAL) BY MOUTH 2 (TWO) TIMES DAILY.    Dispense:  180 tablet    Refill:  3  . Tiotropium  Bromide Monohydrate (SPIRIVA RESPIMAT) 1.25 MCG/ACT AERS    Sig: Inhale 2 puffs into the lungs daily.    Dispense:  4 g    Refill:  11  . methylPREDNISolone (MEDROL DOSEPAK) 4 MG TBPK tablet    Sig: TAKE AS DIRECTED    Dispense:  21 tablet    Refill:  0     Follow-up: No Follow-up on file.  Sanda Linger, MD

## 2015-10-08 NOTE — Progress Notes (Signed)
Pre visit review using our clinic review tool, if applicable. No additional management support is needed unless otherwise documented below in the visit note. 

## 2015-10-08 NOTE — Patient Instructions (Signed)
Asthma, Adult Asthma is a recurring condition in which the airways tighten and narrow. Asthma can make it difficult to breathe. It can cause coughing, wheezing, and shortness of breath. Asthma episodes, also called asthma attacks, range from minor to life-threatening. Asthma cannot be cured, but medicines and lifestyle changes can help control it. CAUSES Asthma is believed to be caused by inherited (genetic) and environmental factors, but its exact cause is unknown. Asthma may be triggered by allergens, lung infections, or irritants in the air. Asthma triggers are different for each person. Common triggers include:   Animal dander.  Dust mites.  Cockroaches.  Pollen from trees or grass.  Mold.  Smoke.  Air pollutants such as dust, household cleaners, hair sprays, aerosol sprays, paint fumes, strong chemicals, or strong odors.  Cold air, weather changes, and winds (which increase molds and pollens in the air).  Strong emotional expressions such as crying or laughing hard.  Stress.  Certain medicines (such as aspirin) or types of drugs (such as beta-blockers).  Sulfites in foods and drinks. Foods and drinks that may contain sulfites include dried fruit, potato chips, and sparkling grape juice.  Infections or inflammatory conditions such as the flu, a cold, or an inflammation of the nasal membranes (rhinitis).  Gastroesophageal reflux disease (GERD).  Exercise or strenuous activity. SYMPTOMS Symptoms may occur immediately after asthma is triggered or many hours later. Symptoms include:  Wheezing.  Excessive nighttime or early morning coughing.  Frequent or severe coughing with a common cold.  Chest tightness.  Shortness of breath. DIAGNOSIS  The diagnosis of asthma is made by a review of your medical history and a physical exam. Tests may also be performed. These may include:  Lung function studies. These tests show how much air you breathe in and out.  Allergy  tests.  Imaging tests such as X-rays. TREATMENT  Asthma cannot be cured, but it can usually be controlled. Treatment involves identifying and avoiding your asthma triggers. It also involves medicines. There are 2 classes of medicine used for asthma treatment:   Controller medicines. These prevent asthma symptoms from occurring. They are usually taken every day.  Reliever or rescue medicines. These quickly relieve asthma symptoms. They are used as needed and provide short-term relief. Your health care provider will help you create an asthma action plan. An asthma action plan is a written plan for managing and treating your asthma attacks. It includes a list of your asthma triggers and how they may be avoided. It also includes information on when medicines should be taken and when their dosage should be changed. An action plan may also involve the use of a device called a peak flow meter. A peak flow meter measures how well the lungs are working. It helps you monitor your condition. HOME CARE INSTRUCTIONS   Take medicines only as directed by your health care provider. Speak with your health care provider if you have questions about how or when to take the medicines.  Use a peak flow meter as directed by your health care provider. Record and keep track of readings.  Understand and use the action plan to help minimize or stop an asthma attack without needing to seek medical care.  Control your home environment in the following ways to help prevent asthma attacks:  Do not smoke. Avoid being exposed to secondhand smoke.  Change your heating and air conditioning filter regularly.  Limit your use of fireplaces and wood stoves.  Get rid of pests (such as roaches   and mice) and their droppings.  Throw away plants if you see mold on them.  Clean your floors and dust regularly. Use unscented cleaning products.  Try to have someone else vacuum for you regularly. Stay out of rooms while they are  being vacuumed and for a short while afterward. If you vacuum, use a dust mask from a hardware store, a double-layered or microfilter vacuum cleaner bag, or a vacuum cleaner with a HEPA filter.  Replace carpet with wood, tile, or vinyl flooring. Carpet can trap dander and dust.  Use allergy-proof pillows, mattress covers, and box spring covers.  Wash bed sheets and blankets every week in hot water and dry them in a dryer.  Use blankets that are made of polyester or cotton.  Clean bathrooms and kitchens with bleach. If possible, have someone repaint the walls in these rooms with mold-resistant paint. Keep out of the rooms that are being cleaned and painted.  Wash hands frequently. SEEK MEDICAL CARE IF:   You have wheezing, shortness of breath, or a cough even if taking medicine to prevent attacks.  The colored mucus you cough up (sputum) is thicker than usual.  Your sputum changes from clear or white to yellow, green, gray, or bloody.  You have any problems that may be related to the medicines you are taking (such as a rash, itching, swelling, or trouble breathing).  You are using a reliever medicine more than 2-3 times per week.  Your peak flow is still at 50-79% of your personal best after following your action plan for 1 hour.  You have a fever. SEEK IMMEDIATE MEDICAL CARE IF:   You seem to be getting worse and are unresponsive to treatment during an asthma attack.  You are short of breath even at rest.  You get short of breath when doing very little physical activity.  You have difficulty eating, drinking, or talking due to asthma symptoms.  You develop chest pain.  You develop a fast heartbeat.  You have a bluish color to your lips or fingernails.  You are light-headed, dizzy, or faint.  Your peak flow is less than 50% of your personal best.   This information is not intended to replace advice given to you by your health care provider. Make sure you discuss any  questions you have with your health care provider.   Document Released: 04/11/2005 Document Revised: 12/31/2014 Document Reviewed: 11/08/2012 Elsevier Interactive Patient Education 2016 Elsevier Inc.  

## 2015-10-09 ENCOUNTER — Emergency Department (HOSPITAL_COMMUNITY): Payer: Medicare Other

## 2015-10-09 ENCOUNTER — Encounter (HOSPITAL_COMMUNITY): Payer: Self-pay | Admitting: *Deleted

## 2015-10-09 ENCOUNTER — Inpatient Hospital Stay (HOSPITAL_COMMUNITY)
Admission: EM | Admit: 2015-10-09 | Discharge: 2015-10-14 | DRG: 208 | Disposition: A | Discharge status: Skilled Nursing Facility | Payer: Medicare Other | Attending: Internal Medicine | Admitting: Internal Medicine

## 2015-10-09 ENCOUNTER — Inpatient Hospital Stay (HOSPITAL_COMMUNITY): Payer: Medicare Other

## 2015-10-09 DIAGNOSIS — E039 Hypothyroidism, unspecified: Secondary | ICD-10-CM | POA: Diagnosis present

## 2015-10-09 DIAGNOSIS — Z6832 Body mass index (BMI) 32.0-32.9, adult: Secondary | ICD-10-CM | POA: Diagnosis not present

## 2015-10-09 DIAGNOSIS — R1311 Dysphagia, oral phase: Secondary | ICD-10-CM | POA: Diagnosis not present

## 2015-10-09 DIAGNOSIS — K219 Gastro-esophageal reflux disease without esophagitis: Secondary | ICD-10-CM | POA: Diagnosis present

## 2015-10-09 DIAGNOSIS — E872 Acidosis: Secondary | ICD-10-CM | POA: Diagnosis not present

## 2015-10-09 DIAGNOSIS — I110 Hypertensive heart disease with heart failure: Secondary | ICD-10-CM | POA: Diagnosis present

## 2015-10-09 DIAGNOSIS — I5032 Chronic diastolic (congestive) heart failure: Secondary | ICD-10-CM | POA: Diagnosis present

## 2015-10-09 DIAGNOSIS — R4182 Altered mental status, unspecified: Secondary | ICD-10-CM | POA: Diagnosis not present

## 2015-10-09 DIAGNOSIS — Z7951 Long term (current) use of inhaled steroids: Secondary | ICD-10-CM

## 2015-10-09 DIAGNOSIS — Z888 Allergy status to other drugs, medicaments and biological substances status: Secondary | ICD-10-CM | POA: Diagnosis not present

## 2015-10-09 DIAGNOSIS — J45901 Unspecified asthma with (acute) exacerbation: Secondary | ICD-10-CM | POA: Insufficient documentation

## 2015-10-09 DIAGNOSIS — R2689 Other abnormalities of gait and mobility: Secondary | ICD-10-CM | POA: Diagnosis not present

## 2015-10-09 DIAGNOSIS — R8271 Bacteriuria: Secondary | ICD-10-CM | POA: Diagnosis not present

## 2015-10-09 DIAGNOSIS — D649 Anemia, unspecified: Secondary | ICD-10-CM | POA: Diagnosis not present

## 2015-10-09 DIAGNOSIS — J209 Acute bronchitis, unspecified: Secondary | ICD-10-CM | POA: Diagnosis present

## 2015-10-09 DIAGNOSIS — B9620 Unspecified Escherichia coli [E. coli] as the cause of diseases classified elsewhere: Secondary | ICD-10-CM | POA: Diagnosis not present

## 2015-10-09 DIAGNOSIS — J4541 Moderate persistent asthma with (acute) exacerbation: Secondary | ICD-10-CM | POA: Diagnosis not present

## 2015-10-09 DIAGNOSIS — F329 Major depressive disorder, single episode, unspecified: Secondary | ICD-10-CM | POA: Diagnosis present

## 2015-10-09 DIAGNOSIS — E785 Hyperlipidemia, unspecified: Secondary | ICD-10-CM | POA: Diagnosis present

## 2015-10-09 DIAGNOSIS — Z883 Allergy status to other anti-infective agents status: Secondary | ICD-10-CM

## 2015-10-09 DIAGNOSIS — N179 Acute kidney failure, unspecified: Secondary | ICD-10-CM | POA: Diagnosis not present

## 2015-10-09 DIAGNOSIS — J440 Chronic obstructive pulmonary disease with acute lower respiratory infection: Secondary | ICD-10-CM | POA: Diagnosis present

## 2015-10-09 DIAGNOSIS — J449 Chronic obstructive pulmonary disease, unspecified: Secondary | ICD-10-CM | POA: Diagnosis not present

## 2015-10-09 DIAGNOSIS — Z5181 Encounter for therapeutic drug level monitoring: Secondary | ICD-10-CM | POA: Diagnosis not present

## 2015-10-09 DIAGNOSIS — Z880 Allergy status to penicillin: Secondary | ICD-10-CM

## 2015-10-09 DIAGNOSIS — I959 Hypotension, unspecified: Secondary | ICD-10-CM | POA: Diagnosis present

## 2015-10-09 DIAGNOSIS — B958 Unspecified staphylococcus as the cause of diseases classified elsewhere: Secondary | ICD-10-CM | POA: Diagnosis not present

## 2015-10-09 DIAGNOSIS — I480 Paroxysmal atrial fibrillation: Secondary | ICD-10-CM | POA: Diagnosis present

## 2015-10-09 DIAGNOSIS — E669 Obesity, unspecified: Secondary | ICD-10-CM | POA: Diagnosis present

## 2015-10-09 DIAGNOSIS — R7989 Other specified abnormal findings of blood chemistry: Secondary | ICD-10-CM

## 2015-10-09 DIAGNOSIS — J9600 Acute respiratory failure, unspecified whether with hypoxia or hypercapnia: Secondary | ICD-10-CM | POA: Diagnosis not present

## 2015-10-09 DIAGNOSIS — Z882 Allergy status to sulfonamides status: Secondary | ICD-10-CM | POA: Diagnosis not present

## 2015-10-09 DIAGNOSIS — R7881 Bacteremia: Secondary | ICD-10-CM | POA: Diagnosis not present

## 2015-10-09 DIAGNOSIS — E876 Hypokalemia: Secondary | ICD-10-CM | POA: Diagnosis present

## 2015-10-09 DIAGNOSIS — E119 Type 2 diabetes mellitus without complications: Secondary | ICD-10-CM | POA: Diagnosis present

## 2015-10-09 DIAGNOSIS — Z886 Allergy status to analgesic agent status: Secondary | ICD-10-CM

## 2015-10-09 DIAGNOSIS — R55 Syncope and collapse: Secondary | ICD-10-CM | POA: Diagnosis not present

## 2015-10-09 DIAGNOSIS — I509 Heart failure, unspecified: Secondary | ICD-10-CM | POA: Diagnosis not present

## 2015-10-09 DIAGNOSIS — J9601 Acute respiratory failure with hypoxia: Principal | ICD-10-CM

## 2015-10-09 DIAGNOSIS — I10 Essential (primary) hypertension: Secondary | ICD-10-CM | POA: Diagnosis not present

## 2015-10-09 DIAGNOSIS — J9620 Acute and chronic respiratory failure, unspecified whether with hypoxia or hypercapnia: Secondary | ICD-10-CM | POA: Diagnosis not present

## 2015-10-09 DIAGNOSIS — Z7901 Long term (current) use of anticoagulants: Secondary | ICD-10-CM | POA: Diagnosis not present

## 2015-10-09 DIAGNOSIS — J441 Chronic obstructive pulmonary disease with (acute) exacerbation: Secondary | ICD-10-CM | POA: Diagnosis present

## 2015-10-09 DIAGNOSIS — J320 Chronic maxillary sinusitis: Secondary | ICD-10-CM | POA: Diagnosis present

## 2015-10-09 DIAGNOSIS — Z4682 Encounter for fitting and adjustment of non-vascular catheter: Secondary | ICD-10-CM | POA: Diagnosis not present

## 2015-10-09 DIAGNOSIS — R778 Other specified abnormalities of plasma proteins: Secondary | ICD-10-CM

## 2015-10-09 DIAGNOSIS — R278 Other lack of coordination: Secondary | ICD-10-CM | POA: Diagnosis not present

## 2015-10-09 DIAGNOSIS — R069 Unspecified abnormalities of breathing: Secondary | ICD-10-CM | POA: Diagnosis not present

## 2015-10-09 DIAGNOSIS — J80 Acute respiratory distress syndrome: Secondary | ICD-10-CM | POA: Diagnosis not present

## 2015-10-09 DIAGNOSIS — Z4659 Encounter for fitting and adjustment of other gastrointestinal appliance and device: Secondary | ICD-10-CM

## 2015-10-09 DIAGNOSIS — J9602 Acute respiratory failure with hypercapnia: Secondary | ICD-10-CM | POA: Diagnosis not present

## 2015-10-09 DIAGNOSIS — J9690 Respiratory failure, unspecified, unspecified whether with hypoxia or hypercapnia: Secondary | ICD-10-CM | POA: Diagnosis present

## 2015-10-09 LAB — BLOOD GAS, ARTERIAL
ACID-BASE DEFICIT: 2.8 mmol/L — AB (ref 0.0–2.0)
ACID-BASE DEFICIT: 1.6 mmol/L (ref 0.0–2.0)
BICARBONATE: 24.8 meq/L — AB (ref 20.0–24.0)
BICARBONATE: 22.8 meq/L (ref 20.0–24.0)
Drawn by: 331471
Drawn by: 308601
FIO2: 0.4
FIO2: 0.4
LHR: 20 {breaths}/min
O2 SAT: 95.8 %
O2 Saturation: 95.9 %
PATIENT TEMPERATURE: 98.6
PEEP/CPAP: 5 cmH2O
PEEP/CPAP: 5 cmH2O
PH ART: 7.236 — AB (ref 7.350–7.450)
Patient temperature: 98.6
RATE: 15 resp/min
TCO2: 23.7 mmol/L (ref 0–100)
TCO2: 21.1 mmol/L (ref 0–100)
VT: 510 mL
VT: 510 mL
pCO2 arterial: 60.6 mmHg (ref 35.0–45.0)
pCO2 arterial: 39.5 mmHg (ref 35.0–45.0)
pH, Arterial: 7.379 (ref 7.350–7.450)
pO2, Arterial: 101 mmHg — ABNORMAL HIGH (ref 80.0–100.0)
pO2, Arterial: 83.1 mmHg (ref 80.0–100.0)

## 2015-10-09 LAB — LACTIC ACID, PLASMA: LACTIC ACID, VENOUS: 4.4 mmol/L — AB (ref 0.5–2.0)

## 2015-10-09 LAB — CBC WITH DIFFERENTIAL/PLATELET
BASOS ABS: 0 10*3/uL (ref 0.0–0.1)
BASOS PCT: 0 %
EOS ABS: 0.1 10*3/uL (ref 0.0–0.7)
Eosinophils Relative: 1 %
HCT: 33.3 % — ABNORMAL LOW (ref 36.0–46.0)
HEMOGLOBIN: 11 g/dL — AB (ref 12.0–15.0)
Lymphocytes Relative: 7 %
Lymphs Abs: 1.4 10*3/uL (ref 0.7–4.0)
MCH: 30.4 pg (ref 26.0–34.0)
MCHC: 33 g/dL (ref 30.0–36.0)
MCV: 92 fL (ref 78.0–100.0)
Monocytes Absolute: 0.9 10*3/uL (ref 0.1–1.0)
Monocytes Relative: 4 %
NEUTROS PCT: 88 %
Neutro Abs: 17.5 10*3/uL — ABNORMAL HIGH (ref 1.7–7.7)
Platelets: 234 10*3/uL (ref 150–400)
RBC: 3.62 MIL/uL — AB (ref 3.87–5.11)
RDW: 14.7 % (ref 11.5–15.5)
WBC: 19.9 10*3/uL — AB (ref 4.0–10.5)

## 2015-10-09 LAB — I-STAT CHEM 8, ED
BUN: 24 mg/dL — ABNORMAL HIGH (ref 6–20)
CALCIUM ION: 1.11 mmol/L — AB (ref 1.13–1.30)
CHLORIDE: 99 mmol/L — AB (ref 101–111)
CREATININE: 1.2 mg/dL — AB (ref 0.44–1.00)
GLUCOSE: 309 mg/dL — AB (ref 65–99)
HCT: 33 % — ABNORMAL LOW (ref 36.0–46.0)
Hemoglobin: 11.2 g/dL — ABNORMAL LOW (ref 12.0–15.0)
Potassium: 3.5 mmol/L (ref 3.5–5.1)
Sodium: 139 mmol/L (ref 135–145)
TCO2: 29 mmol/L (ref 0–100)

## 2015-10-09 LAB — RAPID URINE DRUG SCREEN, HOSP PERFORMED
AMPHETAMINES: NOT DETECTED
BARBITURATES: NOT DETECTED
Benzodiazepines: NOT DETECTED
COCAINE: NOT DETECTED
Opiates: NOT DETECTED
TETRAHYDROCANNABINOL: NOT DETECTED

## 2015-10-09 LAB — URINE MICROSCOPIC-ADD ON
RBC / HPF: NONE SEEN RBC/hpf (ref 0–5)
SQUAMOUS EPITHELIAL / LPF: NONE SEEN

## 2015-10-09 LAB — COMPREHENSIVE METABOLIC PANEL
ALT: 25 U/L (ref 14–54)
AST: 36 U/L (ref 15–41)
Albumin: 3.3 g/dL — ABNORMAL LOW (ref 3.5–5.0)
Alkaline Phosphatase: 78 U/L (ref 38–126)
Anion gap: 9 (ref 5–15)
BUN: 23 mg/dL — AB (ref 6–20)
CHLORIDE: 100 mmol/L — AB (ref 101–111)
CO2: 28 mmol/L (ref 22–32)
Calcium: 8.2 mg/dL — ABNORMAL LOW (ref 8.9–10.3)
Creatinine, Ser: 1.33 mg/dL — ABNORMAL HIGH (ref 0.44–1.00)
GFR, EST AFRICAN AMERICAN: 43 mL/min — AB (ref >60)
GFR, EST NON AFRICAN AMERICAN: 37 mL/min — AB (ref >60)
Glucose, Bld: 324 mg/dL — ABNORMAL HIGH (ref 65–99)
POTASSIUM: 3.3 mmol/L — AB (ref 3.5–5.1)
Sodium: 137 mmol/L (ref 135–145)
Total Bilirubin: 0.8 mg/dL (ref 0.3–1.2)
Total Protein: 6.2 g/dL — ABNORMAL LOW (ref 6.5–8.1)

## 2015-10-09 LAB — URINALYSIS, ROUTINE W REFLEX MICROSCOPIC
Bilirubin Urine: NEGATIVE
GLUCOSE, UA: NEGATIVE mg/dL
Hgb urine dipstick: NEGATIVE
KETONES UR: NEGATIVE mg/dL
NITRITE: NEGATIVE
PROTEIN: NEGATIVE mg/dL
Specific Gravity, Urine: 1.014 (ref 1.005–1.030)
pH: 5 (ref 5.0–8.0)

## 2015-10-09 LAB — I-STAT TROPONIN, ED: TROPONIN I, POC: 0.06 ng/mL (ref 0.00–0.08)

## 2015-10-09 LAB — LIPASE, BLOOD: LIPASE: 28 U/L (ref 11–51)

## 2015-10-09 LAB — TRIGLYCERIDES: Triglycerides: 111 mg/dL (ref <150)

## 2015-10-09 LAB — HEPARIN LEVEL (UNFRACTIONATED): Heparin Unfractionated: <0.1 IU/mL — ABNORMAL LOW (ref 0.30–0.70)

## 2015-10-09 LAB — TROPONIN I: TROPONIN I: 0.35 ng/mL — AB (ref <0.031)

## 2015-10-09 LAB — PROTIME-INR
INR: 1.23 (ref 0.00–1.49)
PROTHROMBIN TIME: 15.2 s (ref 11.6–15.2)

## 2015-10-09 LAB — MRSA PCR SCREENING: MRSA by PCR: NEGATIVE

## 2015-10-09 LAB — PHOSPHORUS: PHOSPHORUS: 6.6 mg/dL — AB (ref 2.5–4.6)

## 2015-10-09 LAB — ETHANOL: Alcohol, Ethyl (B): <5 mg/dL (ref <5)

## 2015-10-09 LAB — PROCALCITONIN: Procalcitonin: <0.1 ng/mL

## 2015-10-09 LAB — APTT: aPTT: 20 seconds — ABNORMAL LOW (ref 24–37)

## 2015-10-09 LAB — MAGNESIUM: MAGNESIUM: 2.1 mg/dL (ref 1.7–2.4)

## 2015-10-09 LAB — ACETAMINOPHEN LEVEL

## 2015-10-09 MED ORDER — SODIUM CHLORIDE 0.9 % IV BOLUS (SEPSIS)
500.0000 mL | Freq: Once | INTRAVENOUS | Status: AC
  Administered 2015-10-10: 500 mL via INTRAVENOUS

## 2015-10-09 MED ORDER — LORATADINE 10 MG PO TABS
10.0000 mg | ORAL_TABLET | Freq: Every day | ORAL | Status: DC
  Administered 2015-10-09: 10 mg via ORAL
  Filled 2015-10-09 (×2): qty 1

## 2015-10-09 MED ORDER — FENTANYL CITRATE (PF) 100 MCG/2ML IJ SOLN
50.0000 ug | INTRAMUSCULAR | Status: DC | PRN
  Administered 2015-10-09: 50 ug via INTRAVENOUS
  Filled 2015-10-09: qty 2

## 2015-10-09 MED ORDER — SODIUM CHLORIDE 0.9 % IV SOLN
INTRAVENOUS | Status: AC | PRN
  Administered 2015-10-09: 1000 mL via INTRAVENOUS

## 2015-10-09 MED ORDER — FAMOTIDINE IN NACL 20-0.9 MG/50ML-% IV SOLN
20.0000 mg | Freq: Two times a day (BID) | INTRAVENOUS | Status: DC
  Administered 2015-10-09 – 2015-10-10 (×2): 20 mg via INTRAVENOUS
  Filled 2015-10-09 (×2): qty 50

## 2015-10-09 MED ORDER — PROPOFOL 1000 MG/100ML IV EMUL
5.0000 ug/kg/min | Freq: Once | INTRAVENOUS | Status: AC
  Administered 2015-10-09: 5 ug/kg/min via INTRAVENOUS

## 2015-10-09 MED ORDER — MONTELUKAST SODIUM 5 MG PO CHEW
10.0000 mg | CHEWABLE_TABLET | Freq: Every day | ORAL | Status: DC
  Administered 2015-10-09: 10 mg via ORAL
  Filled 2015-10-09 (×2): qty 2

## 2015-10-09 MED ORDER — PROPOFOL 1000 MG/100ML IV EMUL
INTRAVENOUS | Status: AC
  Filled 2015-10-09: qty 100

## 2015-10-09 MED ORDER — IPRATROPIUM-ALBUTEROL 0.5-2.5 (3) MG/3ML IN SOLN
3.0000 mL | RESPIRATORY_TRACT | Status: DC
  Administered 2015-10-09 – 2015-10-10 (×6): 3 mL via RESPIRATORY_TRACT
  Filled 2015-10-09 (×6): qty 3

## 2015-10-09 MED ORDER — ARFORMOTEROL TARTRATE 15 MCG/2ML IN NEBU
15.0000 ug | INHALATION_SOLUTION | Freq: Two times a day (BID) | RESPIRATORY_TRACT | Status: DC
  Administered 2015-10-09 – 2015-10-10 (×2): 15 ug via RESPIRATORY_TRACT
  Filled 2015-10-09 (×4): qty 2

## 2015-10-09 MED ORDER — SUCCINYLCHOLINE CHLORIDE 20 MG/ML IJ SOLN
INTRAMUSCULAR | Status: AC | PRN
  Administered 2015-10-09: 100 mg via INTRAVENOUS

## 2015-10-09 MED ORDER — SODIUM CHLORIDE 0.9 % IV SOLN: 250.0000 mL | INTRAVENOUS | Status: DC | PRN

## 2015-10-09 MED ORDER — ALBUTEROL SULFATE (2.5 MG/3ML) 0.083% IN NEBU
5.0000 mg | INHALATION_SOLUTION | Freq: Once | RESPIRATORY_TRACT | Status: AC
Start: 2015-10-09 — End: 2015-10-09
  Administered 2015-10-09: 5 mg via RESPIRATORY_TRACT
  Filled 2015-10-09: qty 6

## 2015-10-09 MED ORDER — SODIUM CHLORIDE 0.9 % IV BOLUS (SEPSIS)
1000.0000 mL | Freq: Once | INTRAVENOUS | Status: AC
  Administered 2015-10-09: 1000 mL via INTRAVENOUS

## 2015-10-09 MED ORDER — NALOXONE HCL 0.4 MG/ML IJ SOLN
0.4000 mg | Freq: Once | INTRAMUSCULAR | Status: AC
  Administered 2015-10-09: 0.4 mg via INTRAVENOUS

## 2015-10-09 MED ORDER — LORAZEPAM 2 MG/ML IJ SOLN
1.0000 mg | Freq: Once | INTRAMUSCULAR | Status: AC
  Administered 2015-10-09: 1 mg via INTRAVENOUS
  Filled 2015-10-09: qty 1

## 2015-10-09 MED ORDER — LEVOTHYROXINE SODIUM 50 MCG PO TABS
50.0000 ug | ORAL_TABLET | Freq: Every day | ORAL | Status: DC
  Administered 2015-10-10: 50 ug via ORAL
  Filled 2015-10-09: qty 1

## 2015-10-09 MED ORDER — FENTANYL CITRATE (PF) 100 MCG/2ML IJ SOLN: 50.0000 ug | INTRAMUSCULAR | Status: DC | PRN

## 2015-10-09 MED ORDER — METHYLPREDNISOLONE SODIUM SUCC 40 MG IJ SOLR
40.0000 mg | Freq: Four times a day (QID) | INTRAMUSCULAR | Status: DC
  Administered 2015-10-09 – 2015-10-11 (×7): 40 mg via INTRAVENOUS
  Filled 2015-10-09 (×7): qty 1

## 2015-10-09 MED ORDER — ANTISEPTIC ORAL RINSE SOLUTION (CORINZ)
7.0000 mL | Freq: Four times a day (QID) | OROMUCOSAL | Status: DC
  Administered 2015-10-09 – 2015-10-10 (×4): 7 mL via OROMUCOSAL

## 2015-10-09 MED ORDER — SODIUM CHLORIDE 0.9 % IV SOLN
INTRAVENOUS | Status: DC
  Administered 2015-10-09: 23:00:00 via INTRAVENOUS
  Administered 2015-10-10: 1000 mL via INTRAVENOUS
  Administered 2015-10-11 – 2015-10-13 (×3): via INTRAVENOUS

## 2015-10-09 MED ORDER — KCL IN DEXTROSE-NACL 20-5-0.45 MEQ/L-%-% IV SOLN
INTRAVENOUS | Status: DC
  Administered 2015-10-09: 21:00:00 via INTRAVENOUS
  Filled 2015-10-09: qty 1000

## 2015-10-09 MED ORDER — CHLORHEXIDINE GLUCONATE 0.12% ORAL RINSE (MEDLINE KIT)
15.0000 mL | Freq: Two times a day (BID) | OROMUCOSAL | Status: DC
  Administered 2015-10-09 – 2015-10-10 (×2): 15 mL via OROMUCOSAL

## 2015-10-09 MED ORDER — ETOMIDATE 2 MG/ML IV SOLN
INTRAVENOUS | Status: AC | PRN
  Administered 2015-10-09: 20 mg via INTRAVENOUS

## 2015-10-09 MED ORDER — FLUTICASONE PROPIONATE 50 MCG/ACT NA SUSP
2.0000 | Freq: Every day | NASAL | Status: DC
  Administered 2015-10-10: 2 via NASAL
  Filled 2015-10-09: qty 16

## 2015-10-09 MED ORDER — BUDESONIDE 0.5 MG/2ML IN SUSP
0.5000 mg | Freq: Two times a day (BID) | RESPIRATORY_TRACT | Status: DC
  Administered 2015-10-09 – 2015-10-10 (×2): 0.5 mg via RESPIRATORY_TRACT
  Filled 2015-10-09 (×2): qty 2

## 2015-10-09 MED ORDER — PROPOFOL 1000 MG/100ML IV EMUL
0.0000 ug/kg/min | INTRAVENOUS | Status: DC
  Administered 2015-10-09 – 2015-10-10 (×2): 5 ug/kg/min via INTRAVENOUS
  Filled 2015-10-09: qty 100

## 2015-10-09 MED ORDER — HEPARIN (PORCINE) IN NACL 100-0.45 UNIT/ML-% IJ SOLN
1000.0000 [IU]/h | INTRAMUSCULAR | Status: DC
  Administered 2015-10-09 – 2015-10-10 (×2): 1150 [IU]/h via INTRAVENOUS
  Filled 2015-10-09 (×3): qty 250

## 2015-10-09 NOTE — ED Notes (Signed)
Left message with Rush Farmerdward Cimler (son) [phone] to call CN when message is obtained.

## 2015-10-09 NOTE — Progress Notes (Signed)
ELink called to notify of temp 96 degrees F despite interventions, Critical value Lactic Acid 4.2, request KUB read to verify new OG placement, and request clarification of maintenance fluid orders.

## 2015-10-09 NOTE — ED Notes (Signed)
Called 20 min timer--patient go up at 1905.

## 2015-10-09 NOTE — ED Notes (Signed)
Made respiratory aware of transport patient to floor.  Was told they were in  Report and would be at least 15 mins before they would be available for transport.

## 2015-10-09 NOTE — ED Notes (Signed)
MD at bedside.  Pt. Responds to voice and will open eyes. Shakes head yes and no to questions being asked. Pt was informed of condition and plan of care. Pt nods head with understanding.   Verbal order. Decrease propofol by 5 mcg and give 1 mg ativan for comfort. BP being monitored.

## 2015-10-09 NOTE — ED Notes (Signed)
0.4 narcan given.  

## 2015-10-09 NOTE — ED Notes (Addendum)
MD at bedside. Dr Milford CageZackowsky made aware of patient's BP dropping.

## 2015-10-09 NOTE — ED Notes (Signed)
Pt called EMS unable to speak in sentences, Resp dist noted by EMS, 02 32% at arrival, 100% with neb treatment 1 abluteroal and atrovent and 1 atrovent. Pt 85% enroute, #20 rt hand, 125 solumederol given. 0.3 epi given.

## 2015-10-09 NOTE — Progress Notes (Signed)
Pt RR increased to 20 due to ABG verbal order per MD.

## 2015-10-09 NOTE — ED Notes (Signed)
Bed: [id number] Expected date: 10/09/15 Expected time:  Means of arrival:  Comments: EMS Respiratory distress

## 2015-10-09 NOTE — Progress Notes (Signed)
Pt transported to ICU on VENT without complication.  Pt suctioned prior to transport, RT to monitor and assess as needed.

## 2015-10-09 NOTE — Progress Notes (Signed)
CRITICAL VALUE ALERT  Critical value received:  Lactic Acid 4.4  Date of notification:  10-09-15  Time of notification:  2210  Critical value read back: Yes Nurse who received alert:  Verdia KubaZoe Yui Mulvaney  MD notified (1st page):  Mignon PineELink Sood  Time of first page:  2211  MD notified (2nd page): [facility] to relay to Dr. Craige CottaSood

## 2015-10-09 NOTE — ED Notes (Signed)
MD at bedside. 

## 2015-10-09 NOTE — Progress Notes (Signed)
eLink Physician-Brief Progress Note Patient Name: [name] [name] DOB: [birth year] MRN: [id number]   Date of Service  10/09/2015  HPI/Events of Note  Hypotensive.  eICU Interventions  Will give 1 liter NS.     Intervention Category Major Interventions: Other:  Jannah Guardiola 10/09/2015, 8:49 PM

## 2015-10-09 NOTE — ED Notes (Signed)
Patient transported to CT. RN/RT

## 2015-10-09 NOTE — Progress Notes (Signed)
ANTICOAGULATION CONSULT NOTE - Initial Consult  Pharmacy Consult for Heparin Indication: atrial fibrillation  Allergies  Allergen Reactions  . Tetracycline Swelling    Swollen tongue  . Acetaminophen Other (See Comments)    ASTHMA MORE SEVERE  . Avelox [Moxifloxacin Hcl In Nacl] Hives, Itching and Rash  . Cardizem [Diltiazem Hcl] Swelling    edema  . Cefaclor Hives, Itching and Rash  . Cephalexin Hives, Itching and Rash  . Clindamycin Hives, Itching and Rash  . Crestor [Rosuvastatin] Other (See Comments)    Muscle aches  . Ibuprofen Other (See Comments)    MAKES ASTHMA WORSE  . Nsaids Other (See Comments)    Respiratory-Asthma WORSE  . Penicillins Hives    Has patient had a PCN reaction causing immediate rash, facial/tongue/throat swelling, SOB or lightheadedness with hypotension: Yes Has patient had a PCN reaction causing severe rash involving mucus membranes or skin necrosis: No Has patient had a PCN reaction that required hospitalization No Has patient had a PCN reaction occurring within the last 10 years: No If all of the above answers are "NO", then may proceed with Cephalosporin use.   . Pravastatin Sodium Other (See Comments)    MYALGIAS  . Simvastatin Other (See Comments)    MYALGIAS  . Sulfonamide Derivatives Hives, Itching and Rash  . Levaquin [Levofloxacin In D5w] Rash  . Lisinopril Cough    Patient Measurements:    Weight 94.8 kg (10/08/15) Heparin Dosing Weight: 84.4 kg  Vital Signs: Temp: 95.4 F (35.2 C) (06/16 1815) BP: 105/64 mmHg (06/16 1829) Pulse Rate: 66 (06/16 1829)  Labs:  Recent Labs  10/09/15 1622 10/09/15 1650  HGB 11.0* 11.2*  HCT 33.3* 33.0*  PLT 234  --   LABPROT 15.2  --   INR 1.23  --   CREATININE 1.33* 1.20*    Estimated Creatinine Clearance: 47.3 mL/min (by C-G formula based on Cr of 1.2).   Medical History: Past Medical History  Diagnosis Date  . Migraine, unspecified, without mention of intractable migraine  without mention of status migrainosus   . HLD (hyperlipidemia)   . Aortic insufficiency   . Colon polyps   . Hypothyroidism   . GERD (gastroesophageal reflux disease)   . Osteoarthritis   . HTN (hypertension)   . Depression   . PAF (paroxysmal atrial fibrillation) (HCC)     on coumadin and Cardiazem  . Asthma   . Allergic rhinitis   . Chronic anticoagulation     on coumadin  . Obesity   . Diastolic dysfunction   . S/P cardiac cath 2003    no obstructive disease     Medications:  Prescriptions prior to admission  Medication Sig Dispense Refill Last Dose  . albuterol (PROAIR HFA) 108 (90 Base) MCG/ACT inhaler Inhale 2 puffs into the lungs every 6 (six) hours as needed for wheezing or shortness of breath. 8.5 each 5 10/09/2015 at Unknown time  . albuterol (PROVENTIL) (2.5 MG/3ML) 0.083% nebulizer solution Take 3 mLs (2.5 mg total) by nebulization every 6 (six) hours as needed for wheezing or shortness of breath. 150 mL 1 Past Month at Unknown time  . budesonide-formoterol (SYMBICORT) 160-4.5 MCG/ACT inhaler Inhale 2 puffs into the lungs 2 (two) times daily. 1 Inhaler 11 Past Month at Unknown time  . esomeprazole (NEXIUM) 40 MG capsule Take 1 capsule (40 mg total) by mouth 2 (two) times daily before a meal. 180 capsule 3 10/09/2015 at Unknown time  . fluticasone (FLONASE) 50 MCG/ACT nasal spray Place  2 sprays into both nostrils daily. 48 g 1 Past Month at Unknown time  . levothyroxine (SYNTHROID) 50 MCG tablet Take 1 tablet (50 mcg total) by mouth daily before breakfast. 90 tablet 3 10/09/2015 at Unknown time  . loratadine (CLARITIN) 10 MG tablet Take 10 mg by mouth daily.   Unknown  . metoprolol tartrate (LOPRESSOR) 25 MG tablet TAKE 1 TABLET (25 MG TOTAL) BY MOUTH 2 (TWO) TIMES DAILY. 180 tablet 3 Unknown at UNKNOWN  . montelukast (SINGULAIR) 10 MG tablet Take 1 tablet (10 mg total) by mouth at bedtime. 90 tablet 3 Unknown  . Rivaroxaban (XARELTO) 15 MG TABS tablet Take 1 tablet (15 mg  total) by mouth daily with supper. 30 tablet 0 UNKNOWN at UNKNOWN  . sodium chloride (OCEAN) 0.65 % SOLN nasal spray Place 2 sprays into both nostrils 4 (four) times daily. (Patient taking differently: Place 2 sprays into both nostrils 2 (two) times daily. ) 90 mL 0 Past Week at Unknown time  . telmisartan (MICARDIS) 80 MG tablet Take 1 tablet (80 mg total) by mouth daily. 90 tablet 3 Unknown  . tolterodine (DETROL LA) 4 MG 24 hr capsule Take 1 capsule (4 mg total) by mouth daily. 90 capsule 3 Unknown  . torsemide (DEMADEX) 20 MG tablet Take 1 tablet (20 mg total) by mouth 2 (two) times daily. 180 tablet 1 Unknown  . methylPREDNISolone (MEDROL DOSEPAK) 4 MG TBPK tablet TAKE AS DIRECTED (Patient not taking: Reported on 10/09/2015) 21 tablet 0   . Tiotropium Bromide Monohydrate (SPIRIVA RESPIMAT) 1.25 MCG/ACT AERS Inhale 2 puffs into the lungs daily. (Patient not taking: Reported on 10/09/2015) 4 g 11 Not Taking at Unknown time   Scheduled:  . arformoterol  15 mcg Nebulization BID  . budesonide (PULMICORT) nebulizer solution  0.5 mg Nebulization BID  . famotidine (PEPCID) IV  20 mg Intravenous Q12H  . [START ON 10/10/2015] fluticasone  2 spray Each Nare Daily  . ipratropium-albuterol  3 mL Nebulization Q4H  . [START ON 10/10/2015] levothyroxine  50 mcg Oral QAC breakfast  . loratadine  10 mg Oral Daily  . methylPREDNISolone (SOLU-MEDROL) injection  40 mg Intravenous Q6H  . montelukast  10 mg Oral QHS  . propofol       Infusions:  . [START ON 10/10/2015] sodium chloride    . dextrose 5 % and 0.45 % NaCl with KCl 20 mEq/L    . heparin    . propofol (DIPRIVAN) infusion 5 mcg/kg/min (10/09/15 1919)    Assessment: 30 yoF presented to ED on 6/16 with respiratory distress and AMS.  PMH includes atrial fibrillation on chronic Xarelto.  Xarelto was held after intubtion in ED and pharmacy is consulted for Heparin IV dosing.  PTA Xarelto 15 mg daily w/ supper - pt currently unable to state last time of  administration d/t intubation and sedation.  Son is unable to confirm last dose time.  Today, 10/09/2015:  Baseline aPTT 20, baseline HL <0.1  CBC: Hgb 11.2, Plt 234  SCr 1.2 with CrCl ~ 47 ml/min  Goal of Therapy:  Heparin level 0.3-0.7 units/ml aPTT 66-102 seconds Monitor platelets by anticoagulation protocol: Yes   Plan:   Baseline PTT, PT/INR  No heparin bolus d/t potentially recent Xarelto  Start heparin IV infusion at 1150 units/hr  HL and aPTT 8 hours after starting  If HL and aPTT continue to correlate, may resume monitoring of HL alone and d/c aPTT monitoring.    Daily heparin level and CBC  Continue to monitor H&H and platelets   Lynann Beaverhristine Chrysta Fulcher PharmD, BCPS Pager [phone] 10/09/2015 6:50 PM

## 2015-10-09 NOTE — H&P (Addendum)
PULMONARY / CRITICAL CARE MEDICINE   Name: [name] CarryMargaret M Lozier-Arps MRN: [id number] DOB: [birth year]    ADMISSION DATE:  10/09/2015 CONSULTATION DATE:  10/09/2015  REFERRING MD:  ED Dr. Vanetta MuldersScott Zackowski  CHIEF COMPLAINT:  Resp Fx  HISTORY OF PRESENT ILLNESS:   Pt known to have chronic asthma with fixed airway obstruction (FEV1 1.61 69%), on Symbicort, Spiriva, nebulizer medicines. She has a lot of allergy to antibiotics and according to the son, she was tested and was found to be tolerant to [facility], presented to the emergency room with unresponsiveness. She saw Dr. Delton CoombesByrum in 2015 and her asthma has been stable until the last couple of days. She saw her primary care doctor yesterday and was prescribed prednisone for acute flareup of asthma. According to the son, patient was not too bad as partial breathing is concerned. It is not sure what happened noon time but I think patient overexerted herself and became severely short of breath. She was seen to be in respiratory distress and unresponsive. She was brought to the emergency room. He was intubated for unresponsiveness and respiratory distress.  ABG postintubation showed pH of 7.23, PCO2 61, PO2 of 101 and that's on 40%. Blood pressure is on the soft side with propofol. Propofol has been discontinued. She remains sedated on and off. She is currently on her second and third liters of IV fluids. Still tight with wheezing.  She woke up a little bit and she said she just felt short of breath. Denied any chest pain.   PAST MEDICAL HISTORY :  She  has a past medical history of Migraine, unspecified, without mention of intractable migraine without mention of status migrainosus; HLD (hyperlipidemia); Aortic insufficiency; Colon polyps; Hypothyroidism; GERD (gastroesophageal reflux disease); Osteoarthritis; HTN (hypertension); Depression; PAF (paroxysmal atrial fibrillation) (HCC); Asthma; Allergic rhinitis; Chronic anticoagulation; Obesity; Diastolic  dysfunction; and S/P cardiac cath (2003).  PAST SURGICAL HISTORY: She  has past surgical history that includes Appendectomy; Abdominal hysterectomy; Tubal ligation; US ECHOCARDIOGRAPHY (2007); and Replacement total knee (Right).  Allergies  Allergen Reactions  . Tetracycline Swelling    Swollen tongue  . Acetaminophen Other (See Comments)    ASTHMA MORE SEVERE  . Avelox [Moxifloxacin Hcl In Nacl] Hives, Itching and Rash  . Cardizem [Diltiazem Hcl] Swelling    edema  . Cefaclor Hives, Itching and Rash  . Cephalexin Hives, Itching and Rash  . Clindamycin Hives, Itching and Rash  . Crestor [Rosuvastatin] Other (See Comments)    Muscle aches  . Ibuprofen Other (See Comments)    MAKES ASTHMA WORSE  . Nsaids Other (See Comments)    Respiratory-Asthma WORSE  . Penicillins Hives    Has patient had a PCN reaction causing immediate rash, facial/tongue/throat swelling, SOB or lightheadedness with hypotension: Yes Has patient had a PCN reaction causing severe rash involving mucus membranes or skin necrosis: No Has patient had a PCN reaction that required hospitalization No Has patient had a PCN reaction occurring within the last 10 years: No If all of the above answers are "NO", then may proceed with Cephalosporin use.   . Pravastatin Sodium Other (See Comments)    MYALGIAS  . Simvastatin Other (See Comments)    MYALGIAS  . Sulfonamide Derivatives Hives, Itching and Rash  . Levaquin [Levofloxacin In D5w] Rash  . Lisinopril Cough    No current facility-administered medications on file prior to encounter.   Current Outpatient Prescriptions on File Prior to Encounter  Medication Sig  . albuterol (PROAIR HFA) 108 (90 Base)  MCG/ACT inhaler Inhale 2 puffs into the lungs every 6 (six) hours as needed for wheezing or shortness of breath.  Marland Kitchen albuterol (PROVENTIL) (2.5 MG/3ML) 0.083% nebulizer solution Take 3 mLs (2.5 mg total) by nebulization every 6 (six) hours as needed for wheezing or  shortness of breath.  . budesonide-formoterol (SYMBICORT) 160-4.5 MCG/ACT inhaler Inhale 2 puffs into the lungs 2 (two) times daily.  Marland Kitchen esomeprazole (NEXIUM) 40 MG capsule Take 1 capsule (40 mg total) by mouth 2 (two) times daily before a meal.  . fluticasone (FLONASE) 50 MCG/ACT nasal spray Place 2 sprays into both nostrils daily.  Marland Kitchen levothyroxine (SYNTHROID) 50 MCG tablet Take 1 tablet (50 mcg total) by mouth daily before breakfast.  . loratadine (CLARITIN) 10 MG tablet Take 10 mg by mouth daily.  . metoprolol tartrate (LOPRESSOR) 25 MG tablet TAKE 1 TABLET (25 MG TOTAL) BY MOUTH 2 (TWO) TIMES DAILY.  . montelukast (SINGULAIR) 10 MG tablet Take 1 tablet (10 mg total) by mouth at bedtime.  . Rivaroxaban (XARELTO) 15 MG TABS tablet Take 1 tablet (15 mg total) by mouth daily with supper.  . sodium chloride (OCEAN) 0.65 % SOLN nasal spray Place 2 sprays into both nostrils 4 (four) times daily. (Patient taking differently: Place 2 sprays into both nostrils 2 (two) times daily. )  . telmisartan (MICARDIS) 80 MG tablet Take 1 tablet (80 mg total) by mouth daily.  Marland Kitchen tolterodine (DETROL LA) 4 MG 24 hr capsule Take 1 capsule (4 mg total) by mouth daily.  Marland Kitchen torsemide (DEMADEX) 20 MG tablet Take 1 tablet (20 mg total) by mouth 2 (two) times daily.  . methylPREDNISolone (MEDROL DOSEPAK) 4 MG TBPK tablet TAKE AS DIRECTED (Patient not taking: Reported on 10/09/2015)  . Tiotropium Bromide Monohydrate (SPIRIVA RESPIMAT) 1.25 MCG/ACT AERS Inhale 2 puffs into the lungs daily. (Patient not taking: Reported on 10/09/2015)    FAMILY HISTORY:  Her indicated that her mother is deceased. She indicated that her father is deceased.   SOCIAL HISTORY: She  reports that she has never smoked. She has never used smokeless tobacco. She reports that she drinks about 3.0 oz of alcohol per week. She reports that she does not use illicit drugs.  REVIEW OF SYSTEMS:   Unable to obtain as patient is intubated.  SUBJECTIVE:   Short of breath. When sedated, she is comfortable. Denies any chest pain.  VITAL SIGNS: BP 105/64 mmHg  Pulse 66  Temp(Src) 95.4 F (35.2 C)  Resp 20  SpO2 96%  LMP 12/01/2010  HEMODYNAMICS:    VENTILATOR SETTINGS: Vent Mode:  [-] PRVC FiO2 (%):  [40 %-100 %] 40 % Set Rate:  [15 bmp-20 bmp] 20 bmp Vt Set:  [510 mL] 510 mL PEEP:  [5 cmH20] 5 cmH20 Plateau Pressure:  [25 cmH20] 25 cmH20  INTAKE / OUTPUT:    PHYSICAL EXAMINATION: General:  Sedated. Comfortable. Not in distress. Neuro:  Cranial nerves grossly intact. No lateralizing signs. HEENT:  PERLLA, (-) NVD. ETT in place.  Cardiovascular:  Good S1 and S2. No S3 murmur rub or gallop. Lungs:  Dec BS on BLF. Wheezing in both lung fields. Tight airways. Better than initial presentation.  Abdomen:  (+) BS, soft, NT. Obese. (-) masses.  Musculoskeletal:  Trace edema. (-) masses/tenderness/clubbing Skin:  Warm and dry  LABS:  BMET  Recent Labs Lab 10/09/15 1622 10/09/15 1650  NA 137 139  K 3.3* 3.5  CL 100* 99*  CO2 28  --   BUN 23* 24*  CREATININE 1.33* 1.20*  GLUCOSE 324* 309*    Electrolytes  Recent Labs Lab 10/09/15 1622  CALCIUM 8.2*    CBC  Recent Labs Lab 10/09/15 1622 10/09/15 1650  WBC 19.9*  --   HGB 11.0* 11.2*  HCT 33.3* 33.0*  PLT 234  --     Coag's  Recent Labs Lab 10/09/15 1622  INR 1.23    Sepsis Markers No results for input(s): LATICACIDVEN, PROCALCITON, O2SATVEN in the last 168 hours.  ABG  Recent Labs Lab 10/09/15 1615  PHART 7.236*  PCO2ART 60.6*  PO2ART 101*    Liver Enzymes  Recent Labs Lab 10/09/15 1622  AST 36  ALT 25  ALKPHOS 78  BILITOT 0.8  ALBUMIN 3.3*    Cardiac Enzymes No results for input(s): TROPONINI, PROBNP in the last 168 hours.  Glucose No results for input(s): GLUCAP in the last 168 hours.  Imaging Ct Head Wo Contrast  10/09/2015  CLINICAL DATA:  Nonresponsive, respiratory distress. EXAM: CT HEAD WITHOUT CONTRAST  TECHNIQUE: Contiguous axial images were obtained from the base of the skull through the vertex without intravenous contrast. COMPARISON:  None. FINDINGS: Mild right maxillary sinusitis is noted. Minimal chronic ischemic white matter disease is noted. No mass effect or midline shift is noted. Ventricular size is within normal limits. There is no evidence of mass lesion, hemorrhage or acute infarction. IMPRESSION: Mild right maxillary sinusitis. Minimal chronic ischemic white matter disease. No acute intracranial abnormality seen. Electronically Signed   By: Lupita Raider, M.D.   On: 10/09/2015 16:14   Dg Chest Portable 1 View  10/09/2015  CLINICAL DATA:  Respiratory distress. EXAM: PORTABLE CHEST 1 VIEW COMPARISON:  Radiograph of March 30, 2015. FINDINGS: The heart size and mediastinal contours are within normal limits. No pneumothorax or pleural effusion is noted. Endotracheal tube is seen projected over tracheal air shadow with distal tip 2.5 cm above the carina. Both lungs are clear. The visualized skeletal structures are unremarkable. IMPRESSION: Endotracheal tube in grossly good position. No acute cardiopulmonary abnormality seen. Electronically Signed   By: Lupita Raider, M.D.   On: 10/09/2015 15:48     STUDIES:    CULTURES: Blood culture 5/16 > Trache asp 6/16 > Urine cultrue 6/16 >   ANTIBIOTICS: None  SIGNIFICANT EVENTS: 6/16 admit for severe asthma exacerbation. Intubated.   LINES/TUBES:   DISCUSSION: Patient with chronic asthma with fixed airway obstruction, admitted with severe exacerbation of asthma.   ASSESSMENT / PLAN:  PULMONARY A: Acute hypoxemic hypercapnic respiratory failure secondary to severe asthma exacerbation. Secondary to bronchitis. No evidence of pneumonia with x-ray. Failure of outpatient meds. Chronic asthma with fixed airway obstruction with significant BD response (2015) P:   Continue ventilatory support. Vent has been adjusted. Repeat blood  gas in an hour. Holding off on antibiotics for now. Awaiting cultures and will calcitonin. Medrol 60 every 6, Pulmicort and Brovana neb twice a day, DuoNeb every 4 hours. I anticipate we can start pressure support trial in 2-3 days. She looked comfortable when propofol was discontinued at the ER briefly. Cont Flonase, Claritin, Singulair.  CARDIOVASCULAR A:  Diastolic dysfunction. Atrial fibrillation. P:  We'll switch to heparin drip. She was on [facility] doesn't outpatient. Trend troponin. May need 2-D echo.  RENAL A:   Acute kidney injury probably secondary to volume depletion. P:   Patient is on her second and third liter of saline. Start maintenance fluids after. Check electrolytes.  GASTROINTESTINAL A:   Gastroesophageal reflux disease. P:  Pepcid 20 mg IV twice a day.  HEMATOLOGIC A:   On xarelto for chronic afib P:  We'll switch to heparin drip.  INFECTIOUS A:   No evidence of infection. bronchitis P:   PT HAS A LOT OF ALLERGIES TO MEDS. PER SON, SHE IS ABLE TO TOLERATE AVELOX.  Panculture. Check pro calcitonin.  ENDOCRINE A:   Hypothyroidism.  P:   Resume Synthroid.  NEUROLOGIC A:   Agitation P:   RASS goal: 0 - -1 Propofol causes her blood pressure to drop. We'll start her on fentanyl pushes. May be able to supplement with lower dose propofol.   FAMILY  - Updates: I extensively discussed the case with patient's son. I explained to him her overall critical nature of disease. He understands. He is concerned about antibiotics if we need to give it to her. She mentioned, she has been tested in the past and she is able to tolerate Avelox.    - Inter-disciplinary family meet or Palliative Care meeting due by:  10/16/15  Critical care time spent on this patient today was 35 minutes.  Pollie Meyer, MD Pulmonary and Critical Care Medicine [facility] Pager: [phone] After 3 pm or if no response, call [phone]  10/09/2015, 6:44  PM

## 2015-10-09 NOTE — ED Provider Notes (Signed)
CSN: [id number]     Arrival date & time 10/09/15  1514 History   First MD Initiated Contact with Patient 10/09/15 1537     Chief Complaint  Patient presents with  . Respiratory Distress     (Consider location/radiation/quality/duration/timing/severity/associated sxs/prior Treatment) The history is provided by the EMS personnel. The history is limited by the condition of the patient.  Patient arrived unresponsive. There was a call for respiratory problems but patient was not speaking well. EMS arrived she was able to speak a little bit and lost her mental status on the way in. Patient has a history of COPD. Patient best we can tell not on home oxygen. Patient lives by herself in place to call herself. EMS did not check blood sugar in route. They did give her 2 nebulizer treatments and Solu-Medrol. Patient wheezing upon arrival.  Past Medical History  Diagnosis Date  . Migraine, unspecified, without mention of intractable migraine without mention of status migrainosus   . HLD (hyperlipidemia)   . Aortic insufficiency   . Colon polyps   . Hypothyroidism   . GERD (gastroesophageal reflux disease)   . Osteoarthritis   . HTN (hypertension)   . Depression   . PAF (paroxysmal atrial fibrillation) (HCC)     on coumadin and Cardiazem  . Asthma   . Allergic rhinitis   . Chronic anticoagulation     on coumadin  . Obesity   . Diastolic dysfunction   . S/P cardiac cath 2003    no obstructive disease    Past Surgical History  Procedure Laterality Date  . Appendectomy    . Abdominal hysterectomy    . Tubal ligation    . US echocardiography  2007  . Replacement total knee Right    Family History  Problem Relation Age of Onset  . Heart disease    . Hypertension    . COPD    . Crohn's disease    . Diabetes    . Depression    . Dementia     Social History  Substance Use Topics  . Smoking status: Never Smoker   . Smokeless tobacco: Never Used  . Alcohol Use: 3.0 oz/week    5  Glasses of wine per week   OB History    No data available     Review of Systems  Unable to perform ROS: Mental status change      Allergies  Tetracycline; Acetaminophen; Avelox; Cardizem; Cefaclor; Cephalexin; Clindamycin; Crestor; Ibuprofen; Nsaids; Penicillins; Pravastatin sodium; Simvastatin; Sulfonamide derivatives; Levaquin; and Lisinopril  Home Medications   Prior to Admission medications   Medication Sig Start Date End Date Taking? Authorizing Provider  albuterol (PROAIR HFA) 108 (90 Base) MCG/ACT inhaler Inhale 2 puffs into the lungs every 6 (six) hours as needed for wheezing or shortness of breath. 07/16/15   Etta Grandchild, MD  albuterol (PROVENTIL) (2.5 MG/3ML) 0.083% nebulizer solution Take 3 mLs (2.5 mg total) by nebulization every 6 (six) hours as needed for wheezing or shortness of breath. 07/08/15   Etta Grandchild, MD  budesonide-formoterol [facility]) 160-4.5 MCG/ACT inhaler Inhale 2 puffs into the lungs 2 (two) times daily. 04/09/15   Etta Grandchild, MD  esomeprazole (NEXIUM) 40 MG capsule Take 1 capsule (40 mg total) by mouth 2 (two) times daily before a meal. 07/08/15   Etta Grandchild, MD  fluticasone [facility]) 50 MCG/ACT nasal spray Place 2 sprays into both nostrils daily. 07/16/15   Etta Grandchild, MD  levothyroxine (SYNTHROID)  50 MCG tablet Take 1 tablet (50 mcg total) by mouth daily before breakfast. 07/14/15   Etta Grandchild, MD  loratadine (CLARITIN) 10 MG tablet Take 10 mg by mouth daily.    Historical Provider, MD  methylPREDNISolone (MEDROL DOSEPAK) 4 MG TBPK tablet TAKE AS DIRECTED 10/08/15   Etta Grandchild, MD  metoprolol tartrate (LOPRESSOR) 25 MG tablet TAKE 1 TABLET (25 MG TOTAL) BY MOUTH 2 (TWO) TIMES DAILY. 10/08/15   Etta Grandchild, MD  montelukast (SINGULAIR) 10 MG tablet Take 1 tablet (10 mg total) by mouth at bedtime. 07/08/15   Etta Grandchild, MD  Rivaroxaban (XARELTO) 15 MG TABS tablet Take 1 tablet (15 mg total) by mouth daily with supper. 07/08/15    Etta Grandchild, MD  sodium chloride (OCEAN) 0.65 % SOLN nasal spray Place 2 sprays into both nostrils 4 (four) times daily. Patient taking differently: Place 2 sprays into both nostrils 2 (two) times daily.  08/26/13   Joseph Art, DO  telmisartan (MICARDIS) 80 MG tablet Take 1 tablet (80 mg total) by mouth daily. 07/08/15   Etta Grandchild, MD  Tiotropium Bromide Monohydrate (SPIRIVA RESPIMAT) 1.25 MCG/ACT AERS Inhale 2 puffs into the lungs daily. 10/08/15   Etta Grandchild, MD  tolterodine (DETROL LA) 4 MG 24 hr capsule Take 1 capsule (4 mg total) by mouth daily. 04/09/15   Etta Grandchild, MD  torsemide (DEMADEX) 20 MG tablet Take 1 tablet (20 mg total) by mouth 2 (two) times daily. 07/08/15   Etta Grandchild, MD   BP 154/136 mmHg  Pulse 86  Resp 21  SpO2 97%  LMP 12/01/2010 Physical Exam  Constitutional: She appears well-developed. She appears distressed.  HENT:  Head: Normocephalic and atraumatic.  Mouth/Throat: Oropharynx is clear and moist.  Eyes: Conjunctivae and EOM are normal. Pupils are equal, round, and reactive to light.  Neck: Normal range of motion.  Cardiovascular: Normal rate, regular rhythm and normal heart sounds.   No murmur heard. Pulmonary/Chest: She is in respiratory distress. She has wheezes.  Abdominal: Soft. Bowel sounds are normal. She exhibits no distension.  Musculoskeletal: Normal range of motion. She exhibits no edema.  Neurological:  Patient arrived essentially unresponsive. Not able to follow commands. Not able to talk.  Skin: Skin is warm. No erythema.  Nursing note and vitals reviewed.   ED Course  Procedures (including critical care time) Labs Review Labs Reviewed  URINALYSIS, ROUTINE W REFLEX MICROSCOPIC (NOT AT [facility])  COMPREHENSIVE METABOLIC PANEL  LIPASE, BLOOD  PROTIME-INR  CBC WITH DIFFERENTIAL/PLATELET  BLOOD GAS, ARTERIAL  URINE RAPID DRUG SCREEN, HOSP PERFORMED  ETHANOL  ACETAMINOPHEN LEVEL  I-STAT TROPOININ, ED  I-STAT CHEM 8, ED     Imaging Review Dg Chest Portable 1 View  10/09/2015  CLINICAL DATA:  Respiratory distress. EXAM: PORTABLE CHEST 1 VIEW COMPARISON:  Radiograph of March 30, 2015. FINDINGS: The heart size and mediastinal contours are within normal limits. No pneumothorax or pleural effusion is noted. Endotracheal tube is seen projected over tracheal air shadow with distal tip 2.5 cm above the carina. Both lungs are clear. The visualized skeletal structures are unremarkable. IMPRESSION: Endotracheal tube in grossly good position. No acute cardiopulmonary abnormality seen. Electronically Signed   By: Lupita Raider, M.D.   On: 10/09/2015 15:48   I have personally reviewed and evaluated these images and lab results as part of my medical decision-making.   EKG Interpretation   Date/Time:  Friday October 09 2015 15:18:59 EDT  Ventricular Rate:  102 PR Interval:  202 QRS Duration: 80 QT Interval:  254 QTC Calculation: 331 R Axis:   65 Text Interpretation:  Sinus tachycardia Anteroseptal infarct, old Repol  abnrm, severe global ischemia (LM/MVD) Confirmed by Ireanna Finlayson  MD, Jemila Camille  ([id number]) on 10/09/2015 3:59:51 [phone]       CRITICAL CARE Performed by: Vanetta MuldersZACKOWSKI,Karris Deangelo Total critical care time: 60 minutes Critical care time was exclusive of separately billable procedures and treating other patients. Critical care was necessary to treat or prevent imminent or life-threatening deterioration. Critical care was time spent personally by me on the following activities: development of treatment plan with patient and/or surrogate as well as nursing, discussions with consultants, evaluation of patient's response to treatment, examination of patient, obtaining history from patient or surrogate, ordering and performing treatments and interventions, ordering and review of laboratory studies, ordering and review of radiographic studies, pulse oximetry and re-evaluation of patient's condition.  INTUBATION Performed by:  Nell Gales  Required items: required blood products, implants, devices, and special equipment available Patient identity confirmed: provided demographic data and hospital-assigned identification number Time out: Immediately prior to procedure a "time out" was called to verify the correct patient, procedure, equipment, support staff and site/side marked as required.  Indications: Respiratory failure   Intubation method: Glidescope Laryngoscopy   Preoxygenation: 100%  BVM  Sedatives: 20 mg Etomidate Paralytic: 100 mg Succinylcholine  Tube Size: 7.5 cuffed  Post-procedure assessment: chest rise and ETCO2 monitor Breath sounds: equal and absent over the epigastrium Tube secured with: ETT holder Chest x-ray interpreted by radiologist and me.  Chest x-ray findings: endotracheal tube in appropriate position  Patient tolerated the procedure well with no immediate complications.      MDM   Final diagnoses:  Acute respiratory failure with hypoxia (HCC)  Chronic obstructive pulmonary disease, unspecified COPD type (HCC)  Altered mental status, unspecified altered mental status type     Based on patient's blood gas this appears to be a pure respiratory failure due to elevated CO2. Discussed with critical care they will admit. We've had some of low blood pressures recently which refills related to the propofol were dialing back on that. Based on patient's presentation blood pressures doubt that this is a septic kind of picture. Patient's meld status is improving. She is tolerating being intubated she is much more awake.  probably intubation overnight and then weaned in the morning.  Patient on arrival had significant altered mental status was drooling. Initially started on BiPAP was wheezing bilaterally not moving air bilaterally well. Was not clear whether was per respiratory failure with perhaps there was altered mental status due to a CNS event.  Patient was intubated after  being on BiPAP did tried Narcan did check blood sugars no sniffing change with Narcan blood sugar was reasonable.  Patient's chest x-ray showed no evidence of pneumonia head CT showed no acute changes.  Vanetta MuldersScott Donovan Persley, MD 10/09/15 1723

## 2015-10-09 NOTE — ED Notes (Signed)
Patient son, Renae Fickled, [phone]

## 2015-10-09 NOTE — ED Notes (Signed)
[facility] pt called 911 Resp Dist. Not complete sentences. Fire initial oxygen low 40s.non responsive upon arrival.   10 albutorl .5 atrovent 125 solumedrol .3 epi in route

## 2015-10-09 NOTE — Progress Notes (Signed)
eLink Physician-Brief Progress Note Patient Name: [name] CarryMargaret M [name] DOB: [birth year] MRN: [id number]   Date of Service  10/09/2015  HPI/Events of Note  Hypothermia with temp of 96. IVF clarification.  Check OGT placement  eICU Interventions  Plan: Warming blanket D/C D5NS with potassium OGT in correct position     Intervention Category Intermediate Interventions: Other:  [name] 10/09/2015, 10:30 PM

## 2015-10-09 NOTE — ED Notes (Signed)
RT at bedside. Pt being suctioned.

## 2015-10-09 NOTE — Progress Notes (Signed)
Rt called for pt in respiratory distress. Pt was placed on BIPAP/NIV per verbal order by MD. Pt was took to RES B and intubated.

## 2015-10-10 ENCOUNTER — Inpatient Hospital Stay (HOSPITAL_COMMUNITY): Payer: Medicare Other

## 2015-10-10 DIAGNOSIS — J9602 Acute respiratory failure with hypercapnia: Secondary | ICD-10-CM

## 2015-10-10 DIAGNOSIS — R7989 Other specified abnormal findings of blood chemistry: Secondary | ICD-10-CM

## 2015-10-10 DIAGNOSIS — N179 Acute kidney failure, unspecified: Secondary | ICD-10-CM

## 2015-10-10 DIAGNOSIS — E876 Hypokalemia: Secondary | ICD-10-CM

## 2015-10-10 LAB — CBC WITH DIFFERENTIAL/PLATELET
BASOS ABS: 0 10*3/uL (ref 0.0–0.1)
BASOS PCT: 0 %
EOS ABS: 0 10*3/uL (ref 0.0–0.7)
Eosinophils Relative: 0 %
HCT: 31.2 % — ABNORMAL LOW (ref 36.0–46.0)
HEMOGLOBIN: 10.5 g/dL — AB (ref 12.0–15.0)
Lymphocytes Relative: 5 %
Lymphs Abs: 1 10*3/uL (ref 0.7–4.0)
MCH: 30.3 pg (ref 26.0–34.0)
MCHC: 33.7 g/dL (ref 30.0–36.0)
MCV: 90.2 fL (ref 78.0–100.0)
Monocytes Absolute: 0.9 10*3/uL (ref 0.1–1.0)
Monocytes Relative: 4 %
Neutro Abs: 19.3 10*3/uL — ABNORMAL HIGH (ref 1.7–7.7)
Neutrophils Relative %: 91 %
Platelets: 231 10*3/uL (ref 150–400)
RBC: 3.46 MIL/uL — AB (ref 3.87–5.11)
RDW: 15.5 % (ref 11.5–15.5)
WBC: 21.2 10*3/uL — AB (ref 4.0–10.5)

## 2015-10-10 LAB — TROPONIN I
TROPONIN I: 0.49 ng/mL — AB (ref <0.031)
TROPONIN I: 0.5 ng/mL — AB (ref <0.031)
TROPONIN I: 0.64 ng/mL — AB (ref <0.031)
TROPONIN I: 0.53 ng/mL — AB (ref <0.031)
Troponin I: 0.86 ng/mL (ref <0.031)

## 2015-10-10 LAB — ECHOCARDIOGRAM COMPLETE
AV Peak grad: 27 mmHg
AV VEL mean LVOT/AV: 0.84
AV pk vel: 259 cm/s
AVG: 16 mmHg
AVLVOTPG: 24 mmHg
AVPHT: 332 ms
Ao pk vel: 0.94 m/s
CHL CUP MV DEC (S): 209
DOP CAL AO MEAN VELOCITY: 186 cm/s
E decel time: 209 msec
EERAT: 10.81
FS: 53 % — AB (ref 28–44)
HEIGHTINCHES: 68 in
IVS/LV PW RATIO, ED: 0.99
LA ID, A-P, ES: 34 mm
LA diam end sys: 34 mm
LA diam index: 1.55 cm/m2
LA vol index: 24.8 mL/m2
LAVOL: 54.6 mL
LAVOLA4C: 42.8 mL
LV E/e' medial: 10.81
LV E/e'average: 10.81
LV PW d: 10.6 mm — AB (ref 0.6–1.1)
LV TDI E'LATERAL: 9.14
LV TDI E'MEDIAL: 7.94
LVELAT: 9.14 cm/s
LVOT VTI: 55.4 cm
LVOT peak VTI: 0.96 cm
LVOT peak vel: 243 cm/s
MV Annulus VTI: 43.7 cm
MV M vel: 128
MV pk A vel: 143 m/s
MVPG: 4 mmHg
MVPKEVEL: 98.8 m/s
Mean grad: 7 mmHg
RV TAPSE: 24.4 mm
VTI: 57.9 cm
WEIGHTICAEL: 3449.76 [oz_av]

## 2015-10-10 LAB — BLOOD CULTURE ID PANEL (REFLEXED)
Acinetobacter baumannii: NOT DETECTED
CANDIDA ALBICANS: NOT DETECTED
CANDIDA GLABRATA: NOT DETECTED
CANDIDA PARAPSILOSIS: NOT DETECTED
Candida krusei: NOT DETECTED
Candida tropicalis: NOT DETECTED
Carbapenem resistance: NOT DETECTED
ENTEROBACTERIACEAE SPECIES: NOT DETECTED
ENTEROCOCCUS SPECIES: NOT DETECTED
ESCHERICHIA COLI: NOT DETECTED
Enterobacter cloacae complex: NOT DETECTED
HAEMOPHILUS INFLUENZAE: NOT DETECTED
KLEBSIELLA PNEUMONIAE: NOT DETECTED
Klebsiella oxytoca: NOT DETECTED
Listeria monocytogenes: NOT DETECTED
METHICILLIN RESISTANCE: NOT DETECTED
Neisseria meningitidis: NOT DETECTED
PROTEUS SPECIES: NOT DETECTED
PSEUDOMONAS AERUGINOSA: NOT DETECTED
STAPHYLOCOCCUS AUREUS BCID: NOT DETECTED
STREPTOCOCCUS PNEUMONIAE: NOT DETECTED
Serratia marcescens: NOT DETECTED
Staphylococcus species: DETECTED — AB
Streptococcus agalactiae: NOT DETECTED
Streptococcus pyogenes: NOT DETECTED
Streptococcus species: NOT DETECTED
VANCOMYCIN RESISTANCE: NOT DETECTED

## 2015-10-10 LAB — STREP PNEUMONIAE URINARY ANTIGEN: Strep Pneumo Urinary Antigen: NEGATIVE

## 2015-10-10 LAB — BLOOD GAS, ARTERIAL
Acid-base deficit: 3.9 mmol/L — ABNORMAL HIGH (ref 0.0–2.0)
BICARBONATE: 19.3 meq/L — AB (ref 20.0–24.0)
Drawn by: 422461
FIO2: 0.35
LHR: 20 {breaths}/min
O2 Saturation: 97 %
PATIENT TEMPERATURE: 37.7
PEEP/CPAP: 5 cmH2O
PO2 ART: 93.7 mmHg (ref 80.0–100.0)
TCO2: 17.9 mmol/L (ref 0–100)
VT: 510 mL
pCO2 arterial: 31.6 mmHg — ABNORMAL LOW (ref 35.0–45.0)
pH, Arterial: 7.408 (ref 7.350–7.450)

## 2015-10-10 LAB — BASIC METABOLIC PANEL
ANION GAP: 10 (ref 5–15)
BUN: 20 mg/dL (ref 6–20)
CALCIUM: 7.7 mg/dL — AB (ref 8.9–10.3)
CO2: 19 mmol/L — AB (ref 22–32)
CREATININE: 1.01 mg/dL — AB (ref 0.44–1.00)
Chloride: 110 mmol/L (ref 101–111)
GFR, EST NON AFRICAN AMERICAN: 52 mL/min — AB (ref >60)
Glucose, Bld: 227 mg/dL — ABNORMAL HIGH (ref 65–99)
Potassium: 2.9 mmol/L — ABNORMAL LOW (ref 3.5–5.1)
SODIUM: 139 mmol/L (ref 135–145)

## 2015-10-10 LAB — GLUCOSE, CAPILLARY
GLUCOSE-CAPILLARY: 151 mg/dL — AB (ref 65–99)
GLUCOSE-CAPILLARY: 143 mg/dL — AB (ref 65–99)
Glucose-Capillary: 176 mg/dL — ABNORMAL HIGH (ref 65–99)

## 2015-10-10 LAB — MAGNESIUM: Magnesium: 1.6 mg/dL — ABNORMAL LOW (ref 1.7–2.4)

## 2015-10-10 LAB — PROCALCITONIN: PROCALCITONIN: 2.04 ng/mL

## 2015-10-10 LAB — HEPARIN LEVEL (UNFRACTIONATED)
HEPARIN UNFRACTIONATED: 0.82 [IU]/mL — AB (ref 0.30–0.70)
Heparin Unfractionated: 0.51 IU/mL (ref 0.30–0.70)
Heparin Unfractionated: 0.7 IU/mL (ref 0.30–0.70)

## 2015-10-10 LAB — APTT: aPTT: 65 seconds — ABNORMAL HIGH (ref 24–37)

## 2015-10-10 LAB — PHOSPHORUS: Phosphorus: 2.5 mg/dL (ref 2.5–4.6)

## 2015-10-10 LAB — LACTIC ACID, PLASMA: LACTIC ACID, VENOUS: 1.3 mmol/L (ref 0.5–2.0)

## 2015-10-10 MED ORDER — SALINE SPRAY 0.65 % NA SOLN
2.0000 | Freq: Two times a day (BID) | NASAL | Status: DC
  Administered 2015-10-10 – 2015-10-14 (×8): 2 via NASAL
  Filled 2015-10-10: qty 44

## 2015-10-10 MED ORDER — POTASSIUM CHLORIDE 20 MEQ/15ML (10%) PO SOLN
40.0000 meq | Freq: Once | ORAL | Status: AC
  Administered 2015-10-10: 40 meq
  Filled 2015-10-10: qty 30

## 2015-10-10 MED ORDER — SODIUM CHLORIDE 0.9 % IV BOLUS (SEPSIS)
1000.0000 mL | Freq: Once | INTRAVENOUS | Status: AC
Start: 2015-10-10 — End: 2015-10-10
  Administered 2015-10-10: 1000 mL via INTRAVENOUS

## 2015-10-10 MED ORDER — LEVOTHYROXINE SODIUM 50 MCG PO TABS
50.0000 ug | ORAL_TABLET | Freq: Every day | ORAL | Status: DC
  Administered 2015-10-11 – 2015-10-14 (×4): 50 ug via ORAL
  Filled 2015-10-10 (×4): qty 1

## 2015-10-10 MED ORDER — MAGNESIUM SULFATE 4 GM/100ML IV SOLN
4.0000 g | Freq: Once | INTRAVENOUS | Status: AC
  Administered 2015-10-10: 4 g via INTRAVENOUS
  Filled 2015-10-10: qty 100

## 2015-10-10 MED ORDER — IPRATROPIUM-ALBUTEROL 0.5-2.5 (3) MG/3ML IN SOLN
3.0000 mL | Freq: Three times a day (TID) | RESPIRATORY_TRACT | Status: DC
  Administered 2015-10-11: 3 mL via RESPIRATORY_TRACT
  Filled 2015-10-10: qty 3

## 2015-10-10 MED ORDER — IPRATROPIUM-ALBUTEROL 0.5-2.5 (3) MG/3ML IN SOLN
3.0000 mL | Freq: Four times a day (QID) | RESPIRATORY_TRACT | Status: DC
  Administered 2015-10-10: 3 mL via RESPIRATORY_TRACT
  Filled 2015-10-10: qty 3

## 2015-10-10 MED ORDER — POTASSIUM CHLORIDE 10 MEQ/100ML IV SOLN
10.0000 meq | INTRAVENOUS | Status: DC
  Administered 2015-10-10 (×4): 10 meq via INTRAVENOUS
  Filled 2015-10-10 (×3): qty 100

## 2015-10-10 MED ORDER — FLUTICASONE PROPIONATE 50 MCG/ACT NA SUSP
2.0000 | Freq: Every day | NASAL | Status: DC
  Filled 2015-10-10: qty 16

## 2015-10-10 MED ORDER — MONTELUKAST SODIUM 10 MG PO TABS
10.0000 mg | ORAL_TABLET | Freq: Every day | ORAL | Status: DC
  Administered 2015-10-10 – 2015-10-13 (×4): 10 mg via ORAL
  Filled 2015-10-10 (×4): qty 1

## 2015-10-10 MED ORDER — SODIUM CHLORIDE 0.9 % IV SOLN: INTRAVENOUS | Status: DC | PRN

## 2015-10-10 MED ORDER — PANTOPRAZOLE SODIUM 40 MG PO TBEC
40.0000 mg | DELAYED_RELEASE_TABLET | Freq: Every day | ORAL | Status: DC
  Administered 2015-10-10 – 2015-10-14 (×5): 40 mg via ORAL
  Filled 2015-10-10 (×5): qty 1

## 2015-10-10 MED ORDER — HEPARIN (PORCINE) IN NACL 100-0.45 UNIT/ML-% IJ SOLN
900.0000 [IU]/h | INTRAMUSCULAR | Status: DC
  Filled 2015-10-10: qty 250

## 2015-10-10 MED ORDER — INSULIN ASPART 100 UNIT/ML ~~LOC~~ SOLN
0.0000 [IU] | SUBCUTANEOUS | Status: DC
  Administered 2015-10-10: 2 [IU] via SUBCUTANEOUS
  Administered 2015-10-10: 1 [IU] via SUBCUTANEOUS
  Administered 2015-10-10: 2 [IU] via SUBCUTANEOUS
  Administered 2015-10-11: 1 [IU] via SUBCUTANEOUS
  Administered 2015-10-11: 2 [IU] via SUBCUTANEOUS

## 2015-10-10 MED ORDER — NON FORMULARY: Status: DC

## 2015-10-10 MED ORDER — MOXIFLOXACIN HCL 400 MG PO TABS
400.0000 mg | ORAL_TABLET | ORAL | Status: DC
  Administered 2015-10-10 – 2015-10-12 (×3): 400 mg via ORAL
  Filled 2015-10-10 (×5): qty 1

## 2015-10-10 MED ORDER — FLUTICASONE FUROATE-VILANTEROL 200-25 MCG/INH IN AEPB
1.0000 | INHALATION_SPRAY | Freq: Every day | RESPIRATORY_TRACT | Status: DC
  Administered 2015-10-10 – 2015-10-11 (×2): 1 via RESPIRATORY_TRACT
  Filled 2015-10-10: qty 28

## 2015-10-10 MED ORDER — LORATADINE 10 MG PO TABS
10.0000 mg | ORAL_TABLET | Freq: Every day | ORAL | Status: DC
  Administered 2015-10-10 – 2015-10-14 (×5): 10 mg via ORAL
  Filled 2015-10-10 (×4): qty 1

## 2015-10-10 NOTE — Progress Notes (Signed)
ANTICOAGULATION CONSULT NOTE   Pharmacy Consult for Heparin Indication: atrial fibrillation  Allergies  Allergen Reactions  . Tetracycline Swelling    Swollen tongue  . Acetaminophen Other (See Comments)    ASTHMA MORE SEVERE  . Avelox [Moxifloxacin Hcl In Nacl] Hives, Itching and Rash  . Cardizem [Diltiazem Hcl] Swelling    edema  . Cefaclor Hives, Itching and Rash  . Cephalexin Hives, Itching and Rash  . Clindamycin Hives, Itching and Rash  . Crestor [Rosuvastatin] Other (See Comments)    Muscle aches  . Ibuprofen Other (See Comments)    MAKES ASTHMA WORSE  . Nsaids Other (See Comments)    Respiratory-Asthma WORSE  . Penicillins Hives    Has patient had a PCN reaction causing immediate rash, facial/tongue/throat swelling, SOB or lightheadedness with hypotension: Yes Has patient had a PCN reaction causing severe rash involving mucus membranes or skin necrosis: No Has patient had a PCN reaction that required hospitalization No Has patient had a PCN reaction occurring within the last 10 years: No If all of the above answers are "NO", then may proceed with Cephalosporin use.   . Pravastatin Sodium Other (See Comments)    MYALGIAS  . Simvastatin Other (See Comments)    MYALGIAS  . Sulfonamide Derivatives Hives, Itching and Rash  . Levaquin [Levofloxacin In D5w] Rash  . Lisinopril Cough    Patient Measurements: Height: 5\' 8"  (172.7 cm) Weight: 215 lb 9.8 oz (97.8 kg) IBW/kg (Calculated) : 63.9  Heparin Dosing Weight: 84.4 kg  Vital Signs: Temp: 99.9 F (37.7 C) (06/17 0400) Temp Source: Core (Comment) (06/17 0400) BP: 109/40 mmHg (06/17 0200) Pulse Rate: 78 (06/17 0335)  Labs:  Recent Labs  10/09/15 1622 10/09/15 1650 10/09/15 1900 10/09/15 2120 10/10/15 0025 10/10/15 0344  HGB 11.0* 11.2*  --   --   --   --   HCT 33.3* 33.0*  --   --   --   --   PLT 234  --   --   --   --   --   APTT  --   --  20*  --   --  65*  LABPROT 15.2  --   --   --   --   --    INR 1.23  --   --   --   --   --   HEPARINUNFRC  --   --  <0.10*  --   --  0.51  CREATININE 1.33* 1.20*  --   --   --  1.01*  TROPONINI  --   --   --  0.35* 0.53* 0.86*    Estimated Creatinine Clearance: 57.1 mL/min (by C-G formula based on Cr of 1.01).   Medical History: Past Medical History  Diagnosis Date  . Migraine, unspecified, without mention of intractable migraine without mention of status migrainosus   . HLD (hyperlipidemia)   . Aortic insufficiency   . Colon polyps   . Hypothyroidism   . GERD (gastroesophageal reflux disease)   . Osteoarthritis   . HTN (hypertension)   . Depression   . PAF (paroxysmal atrial fibrillation) (HCC)     on coumadin and Cardiazem  . Asthma   . Allergic rhinitis   . Chronic anticoagulation     on coumadin  . Obesity   . Diastolic dysfunction   . S/P cardiac cath 2003    no obstructive disease     Medications:  Prescriptions prior to admission  Medication Sig Dispense  Refill Last Dose  . albuterol (PROAIR HFA) 108 (90 Base) MCG/ACT inhaler Inhale 2 puffs into the lungs every 6 (six) hours as needed for wheezing or shortness of breath. 8.5 each 5 10/09/2015 at Unknown time  . albuterol (PROVENTIL) (2.5 MG/3ML) 0.083% nebulizer solution Take 3 mLs (2.5 mg total) by nebulization every 6 (six) hours as needed for wheezing or shortness of breath. 150 mL 1 Past Month at Unknown time  . budesonide-formoterol (SYMBICORT) 160-4.5 MCG/ACT inhaler Inhale 2 puffs into the lungs 2 (two) times daily. 1 Inhaler 11 Past Month at Unknown time  . esomeprazole (NEXIUM) 40 MG capsule Take 1 capsule (40 mg total) by mouth 2 (two) times daily before a meal. 180 capsule 3 10/09/2015 at Unknown time  . fluticasone (FLONASE) 50 MCG/ACT nasal spray Place 2 sprays into both nostrils daily. 48 g 1 Past Month at Unknown time  . levothyroxine (SYNTHROID) 50 MCG tablet Take 1 tablet (50 mcg total) by mouth daily before breakfast. 90 tablet 3 10/09/2015 at Unknown time   . loratadine (CLARITIN) 10 MG tablet Take 10 mg by mouth daily.   Unknown  . metoprolol tartrate (LOPRESSOR) 25 MG tablet TAKE 1 TABLET (25 MG TOTAL) BY MOUTH 2 (TWO) TIMES DAILY. 180 tablet 3 Unknown at UNKNOWN  . montelukast (SINGULAIR) 10 MG tablet Take 1 tablet (10 mg total) by mouth at bedtime. 90 tablet 3 Unknown  . Rivaroxaban (XARELTO) 15 MG TABS tablet Take 1 tablet (15 mg total) by mouth daily with supper. 30 tablet 0 UNKNOWN at UNKNOWN  . sodium chloride (OCEAN) 0.65 % SOLN nasal spray Place 2 sprays into both nostrils 4 (four) times daily. (Patient taking differently: Place 2 sprays into both nostrils 2 (two) times daily. ) 90 mL 0 Past Week at Unknown time  . telmisartan (MICARDIS) 80 MG tablet Take 1 tablet (80 mg total) by mouth daily. 90 tablet 3 Unknown  . tolterodine (DETROL LA) 4 MG 24 hr capsule Take 1 capsule (4 mg total) by mouth daily. 90 capsule 3 Unknown  . torsemide (DEMADEX) 20 MG tablet Take 1 tablet (20 mg total) by mouth 2 (two) times daily. 180 tablet 1 Unknown  . methylPREDNISolone (MEDROL DOSEPAK) 4 MG TBPK tablet TAKE AS DIRECTED (Patient not taking: Reported on 10/09/2015) 21 tablet 0   . Tiotropium Bromide Monohydrate (SPIRIVA RESPIMAT) 1.25 MCG/ACT AERS Inhale 2 puffs into the lungs daily. (Patient not taking: Reported on 10/09/2015) 4 g 11 Not Taking at Unknown time   Scheduled:  . antiseptic oral rinse  7 mL Mouth Rinse QID  . arformoterol  15 mcg Nebulization BID  . budesonide (PULMICORT) nebulizer solution  0.5 mg Nebulization BID  . chlorhexidine gluconate (SAGE KIT)  15 mL Mouth Rinse BID  . famotidine (PEPCID) IV  20 mg Intravenous Q12H  . fluticasone  2 spray Each Nare Daily  . ipratropium-albuterol  3 mL Nebulization Q4H  . levothyroxine  50 mcg Oral QAC breakfast  . loratadine  10 mg Oral Daily  . magnesium sulfate 1 - 4 g bolus IVPB  4 g Intravenous Once  . methylPREDNISolone (SOLU-MEDROL) injection  40 mg Intravenous Q6H  . montelukast  10 mg  Oral QHS  . potassium chloride  10 mEq Intravenous Q1 Hr x 4   Infusions:  . sodium chloride 75 mL/hr at 10/09/15 2301  . heparin 1,150 Units/hr (10/09/15 2050)  . propofol (DIPRIVAN) infusion 5 mcg/kg/min (10/10/15 0524)    Assessment: 63 yoF presented to ED on  6/16 with respiratory distress and AMS.  PMH includes atrial fibrillation on chronic Xarelto.  Xarelto was held after intubtion in ED and pharmacy is consulted for Heparin IV dosing.  PTA Xarelto 15 mg daily w/ supper - pt currently unable to state last time of administration d/t intubation and sedation.  Son was unable to confirm last dose time.  Today, 10/10/2015:  Baseline aPTT 20, baseline HL <0.1  APTT = 65 sec and HL = 0.51 with heparin infusing @ 1150 units/hr (therapeutic)  No bleeding complications noted  Goal of Therapy:  Heparin level 0.3-0.7 units/ml aPTT 66-102 seconds Monitor platelets by anticoagulation protocol: Yes   Plan:   Continue heparin IV infusion at 1150 units/hr  Repeat HL in 8 hours to confirm therapeutic dose (will check HL with next scheduled troponin level)  Daily heparin level and CBC  Continue to monitor H&H and platelets   Leone Haven, PharmD  10/10/2015 6:13 AM

## 2015-10-10 NOTE — Progress Notes (Signed)
eLink Physician-Brief Progress Note Patient Name: [name] DOB: 1940-08-02 MRN: [id number]   Date of Service  10/10/2015  HPI/Events of Note  Pt extubated earlier today w/o issue.  eICU Interventions  Will advance diet, have arterial line removed.     Intervention Category Major Interventions: Other:  Roberto Romanoski 10/10/2015, 4:28 PM

## 2015-10-10 NOTE — Progress Notes (Signed)
eLink Physician-Brief Progress Note Patient Name: [name] CarryMargaret M Lozier-Arps DOB: 1940/12/11 MRN: [id number]   Date of Service  10/10/2015  HPI/Events of Note  Hypokalemia and hypomag  eICU Interventions  Potassium both IV and PO along with mag replaced     Intervention Category Intermediate Interventions: Electrolyte abnormality - evaluation and management  DETERDING,ELIZABETH 10/10/2015, 5:04 AM

## 2015-10-10 NOTE — Progress Notes (Signed)
Pt's rings given to son [name] [name], one ring was gold with green stones and the other was gold with purple stone

## 2015-10-10 NOTE — Progress Notes (Signed)
eLink Physician-Brief Progress Note Patient Name: [name] CarryMargaret M [name] DOB: 08/10/1940 MRN: [id number]   Date of Service  10/10/2015  HPI/Events of Note  Elevated trop of 0.86.  Already on heparin gtt for AF.  eICU Interventions  Plan: Continue heparin Continue to trend trop 2D ECHO ordered 12 lead EKG Consider cardiology consult     Intervention Category Intermediate Interventions: Other:  Lavaughn Haberle 10/10/2015, 4:53 AM

## 2015-10-10 NOTE — Progress Notes (Signed)
PCCM Attending Note:  After 30 minutes on spontaneous breathing trial with pressure support 0/0 and FiO2 0.35 patient is able to perform an inhalation of greater than 1 L. Respiratory rate stable at 18-20. Hemodynamically stable and breathing comfortably. Checking for cuff leak & extubating patient at present.  Donna ChristenJennings E. Jamison NeighborNestor, M.D. [facility] Pager:  [phone] After 3pm or if no response, call [phone] 11:23 AM 10/10/2015

## 2015-10-10 NOTE — Consult Note (Addendum)
CARDIOLOGY CONSULT NOTE     Primary Care Physician: Scarlette Calico, MD Referring Physician:  Hospitalist  Admit Date: 10/09/2015  Reason for consultation:  + troponin  [name] [name] is a 75 y.o. female with a h/o obesity, paroxysmal atrial fibrillation, HTN and chronic asthma admitted with acute respiratory failure requiring intubation.  She has been treated aggressively and has now been extubated.  She was noted to have ST depression and a mildly elevated troponin. Currently, she denies chest pain.  Her primary concern is with wheezing and cough.  She was hypotensive with propofol and did receive several liters of IVF.  Today, she denies symptoms of palpitations, chest pain,  orthopnea, PND, lower extremity edema, dizziness, presyncope, syncope, or neurologic sequela. The patient is tolerating medications without difficulties and is otherwise without complaint today.   Past Medical History  Diagnosis Date  . Migraine, unspecified, without mention of intractable migraine without mention of status migrainosus   . HLD (hyperlipidemia)   . Aortic insufficiency   . Colon polyps   . Hypothyroidism   . GERD (gastroesophageal reflux disease)   . Osteoarthritis   . HTN (hypertension)   . Depression   . PAF (paroxysmal atrial fibrillation) (HCC)     on coumadin and Cardiazem  . Asthma   . Allergic rhinitis   . Chronic anticoagulation     on coumadin  . Obesity   . Diastolic dysfunction   . S/P cardiac cath 2003    no obstructive disease    Past Surgical History  Procedure Laterality Date  . Appendectomy    . Abdominal hysterectomy    . Tubal ligation    . US echocardiography  2007  . Replacement total knee Right     . antiseptic oral rinse  7 mL Mouth Rinse QID  . chlorhexidine gluconate (SAGE KIT)  15 mL Mouth Rinse BID  . fluticasone  2 spray Each Nare Daily  . fluticasone furoate-vilanterol  1 puff Inhalation Daily  . insulin aspart  0-9 Units Subcutaneous Q4H  .  ipratropium-albuterol  3 mL Nebulization Q6H WA  . [START ON 10/11/2015] levothyroxine  50 mcg Oral QAC breakfast  . loratadine  10 mg Oral Daily  . methylPREDNISolone (SOLU-MEDROL) injection  40 mg Intravenous Q6H  . montelukast  10 mg Oral QHS  . moxifloxacin  400 mg Oral Q24H  . pantoprazole  40 mg Oral Daily  . sodium chloride  2 spray Each Nare BID   . sodium chloride 75 mL/hr at 10/10/15 1200  . heparin 1,000 Units/hr (10/10/15 1244)    Allergies  Allergen Reactions  . Tetracycline Swelling    Swollen tongue  . Acetaminophen Other (See Comments)    ASTHMA MORE SEVERE  . Cardizem [Diltiazem Hcl] Swelling    edema  . Cefaclor Hives, Itching and Rash  . Cephalexin Hives, Itching and Rash  . Clindamycin Hives, Itching and Rash  . Crestor [Rosuvastatin] Other (See Comments)    Muscle aches  . Ibuprofen Other (See Comments)    MAKES ASTHMA WORSE  . Nsaids Other (See Comments)    Respiratory-Asthma WORSE  . Penicillins Hives    Has patient had a PCN reaction causing immediate rash, facial/tongue/throat swelling, SOB or lightheadedness with hypotension: Yes Has patient had a PCN reaction causing severe rash involving mucus membranes or skin necrosis: No Has patient had a PCN reaction that required hospitalization No Has patient had a PCN reaction occurring within the last 10 years: No If all of  the above answers are "NO", then may proceed with Cephalosporin use.   . Pravastatin Sodium Other (See Comments)    MYALGIAS  . Simvastatin Other (See Comments)    MYALGIAS  . Sulfonamide Derivatives Hives, Itching and Rash  . Levaquin [Levofloxacin In D5w] Rash  . Lisinopril Cough    Social History   Social History  . Marital Status: Married    Spouse Name: N/A  . Number of Children: 3  . Years of Education: N/A   Occupational History  . retired Therapist, sports    Social History Main Topics  . Smoking status: Never Smoker   . Smokeless tobacco: Never Used  . Alcohol Use: 3.0  oz/week    5 Glasses of wine per week  . Drug Use: No  . Sexual Activity: Not Currently   Other Topics Concern  . Not on file   Social History Narrative    Family History  Problem Relation Age of Onset  . Heart disease    . Hypertension    . COPD    . Crohn's disease    . Diabetes    . Depression    . Dementia      ROS- All systems are reviewed and negative except as per the HPI above  Physical Exam: Telemetry: Filed Vitals:   10/10/15 1300 10/10/15 1400 10/10/15 1500 10/10/15 1614  BP:      Pulse: 87  82   Temp: 99.3 F (37.4 C) 99.3 F (37.4 C) 99.3 F (37.4 C)   TempSrc:      Resp: _0 Height:      Weight:      SpO2: 99%  99% 100%    GEN- The patient is overweight appearing, alert and oriented x 3 today.   Head- normocephalic, atraumatic Eyes-  Sclera clear, conjunctiva pink Ears- hearing intact Oropharynx- clear Neck- supple,   Lungs- diffuse expiratory wheezes Heart- Regular rate and rhythm  GI- soft, NT, ND, + BS Extremities- no clubbing, cyanosis, + dependant edema MS- no significant deformity or atrophy Skin- no rash or lesion Psych- euthymic mood, full affect Neuro- strength and sensation are intact  EKG: reveals sinus rhythm with diffuse ST segment depression  Labs:   Lab Results  Component Value Date   WBC 19.9* 10/09/2015   HGB 11.2* 10/09/2015   HCT 33.0* 10/09/2015   MCV 92.0 10/09/2015   PLT 234 10/09/2015    Recent Labs Lab 10/09/15 1622  10/10/15 0344  NA 137  < > 139  K 3.3*  < > 2.9*  CL 100*  < > 110  CO2 28  --  19*  BUN 23*  < > 20  CREATININE 1.33*  < > 1.01*  CALCIUM 8.2*  --  7.7*  PROT 6.2*  --   --   BILITOT 0.8  --   --   ALKPHOS 78  --   --   ALT 25  --   --   AST 36  --   --   GLUCOSE 324*  < > 227*  < > = values in this interval not displayed. Lab Results  Component Value Date   CKTOTAL 95 09/01/2011   TROPONINI 0.64* 10/10/2015    Lab Results  Component Value Date   CHOL 208* 06/19/2014    CHOL 144 01/29/2013   CHOL 154 01/24/2012   Lab Results  Component Value Date   HDL 61.70 06/19/2014   HDL 63.50 01/29/2013   HDL  58.60 01/24/2012   Lab Results  Component Value Date   LDLCALC 126* 06/19/2014   LDLCALC 68 01/29/2013   LDLCALC 73 01/24/2012   Lab Results  Component Value Date   TRIG 111 10/09/2015   TRIG 100.0 06/19/2014   TRIG 61.0 01/29/2013   Lab Results  Component Value Date   CHOLHDL 3 06/19/2014   CHOLHDL 2 01/29/2013   CHOLHDL 3 01/24/2012   No results found for: LDLDIRECT    Radiology: reviewed  Echo: echo reveals small hyperdynamic LV  ASSESSMENT AND PLAN:   1. Elevated troponin Likely due to demand ischemic in setting of acute medical illness Currently without symptoms of ACS Would treat acute respiratory failure as you are Could consider stress testing once clinically improved and possibly as an outpatient  2. afib Maintaining sinus rhythm Will follow Resume xarelto when able  3. HTN Stable No change required today  4. Overweight Weight loss advised Body mass index is 32.79 kg/(m^2).   5. Hyperdynamic LV Would not advise TEE at this time Would repeat echo elective once clinically improved as outpatient   Thompson Grayer, MD 10/10/2015  4:54 PM

## 2015-10-10 NOTE — Progress Notes (Addendum)
[facility] for Heparin Indication: atrial fibrillation  Allergies  Allergen Reactions  . Tetracycline Swelling    Swollen tongue  . Acetaminophen Other (See Comments)    ASTHMA MORE SEVERE  . Cardizem [Diltiazem Hcl] Swelling    edema  . Cefaclor Hives, Itching and Rash  . Cephalexin Hives, Itching and Rash  . Clindamycin Hives, Itching and Rash  . Crestor [Rosuvastatin] Other (See Comments)    Muscle aches  . Ibuprofen Other (See Comments)    MAKES ASTHMA WORSE  . Nsaids Other (See Comments)    Respiratory-Asthma WORSE  . Penicillins Hives    Has patient had a PCN reaction causing immediate rash, facial/tongue/throat swelling, SOB or lightheadedness with hypotension: Yes Has patient had a PCN reaction causing severe rash involving mucus membranes or skin necrosis: No Has patient had a PCN reaction that required hospitalization No Has patient had a PCN reaction occurring within the last 10 years: No If all of the above answers are "NO", then may proceed with Cephalosporin use.   . Pravastatin Sodium Other (See Comments)    MYALGIAS  . Simvastatin Other (See Comments)    MYALGIAS  . Sulfonamide Derivatives Hives, Itching and Rash  . Levaquin [Levofloxacin In D5w] Rash  . Lisinopril Cough    Patient Measurements: Height: 5' 8" (172.7 cm) Weight: 215 lb 9.8 oz (97.8 kg) IBW/kg (Calculated) : 63.9  Heparin Dosing Weight: 84.4 kg  Vital Signs: Temp: 99.3 F (37.4 C) (06/17 1800) Temp Source: Core (Comment) (06/17 0851) BP: 115/43 mmHg (06/17 0851) Pulse Rate: 88 (06/17 1800)  Labs:  Recent Labs  10/09/15 1622 10/09/15 1650  10/09/15 1900  10/10/15 0025 10/10/15 0344 10/10/15 1055 10/10/15 1811  HGB 11.0* 11.2*  --   --   --   --   --   --  10.5*  HCT 33.3* 33.0*  --   --   --   --   --   --  31.2*  PLT 234  --   --   --   --   --   --   --  231  APTT  --   --   --  20*  --   --  65*  --   --   LABPROT 15.2  --   --   --    --   --   --   --   --   INR 1.23  --   --   --   --   --   --   --   --   HEPARINUNFRC  --   --   < > <0.10*  --   --  0.51 0.82* 0.70  CREATININE 1.33* 1.20*  --   --   --   --  1.01*  --   --   TROPONINI  --   --   --   --   < > 0.53* 0.86* 0.64*  --   < > = values in this interval not displayed.  Estimated Creatinine Clearance: 57.1 mL/min (by C-G formula based on Cr of 1.01).   Medical History: Past Medical History  Diagnosis Date  . Migraine, unspecified, without mention of intractable migraine without mention of status migrainosus   . HLD (hyperlipidemia)   . Aortic insufficiency   . Colon polyps   . Hypothyroidism   . GERD (gastroesophageal reflux disease)   . Osteoarthritis   . HTN (hypertension)   .  Depression   . PAF (paroxysmal atrial fibrillation) (HCC)     on coumadin and Cardiazem  . Asthma   . Allergic rhinitis   . Chronic anticoagulation     on coumadin  . Obesity   . Diastolic dysfunction   . S/P cardiac cath 2003    no obstructive disease     Medications:  Prescriptions prior to admission  Medication Sig Dispense Refill Last Dose  . albuterol (PROAIR HFA) 108 (90 Base) MCG/ACT inhaler Inhale 2 puffs into the lungs every 6 (six) hours as needed for wheezing or shortness of breath. 8.5 each 5 10/09/2015 at Unknown time  . albuterol (PROVENTIL) (2.5 MG/3ML) 0.083% nebulizer solution Take 3 mLs (2.5 mg total) by nebulization every 6 (six) hours as needed for wheezing or shortness of breath. 150 mL 1 Past Month at Unknown time  . budesonide-formoterol (SYMBICORT) 160-4.5 MCG/ACT inhaler Inhale 2 puffs into the lungs 2 (two) times daily. 1 Inhaler 11 Past Month at Unknown time  . esomeprazole (NEXIUM) 40 MG capsule Take 1 capsule (40 mg total) by mouth 2 (two) times daily before a meal. 180 capsule 3 10/09/2015 at Unknown time  . fluticasone (FLONASE) 50 MCG/ACT nasal spray Place 2 sprays into both nostrils daily. 48 g 1 Past Month at Unknown time  .  levothyroxine (SYNTHROID) 50 MCG tablet Take 1 tablet (50 mcg total) by mouth daily before breakfast. 90 tablet 3 10/09/2015 at Unknown time  . loratadine (CLARITIN) 10 MG tablet Take 10 mg by mouth daily.   Unknown  . metoprolol tartrate (LOPRESSOR) 25 MG tablet TAKE 1 TABLET (25 MG TOTAL) BY MOUTH 2 (TWO) TIMES DAILY. 180 tablet 3 Unknown at UNKNOWN  . montelukast (SINGULAIR) 10 MG tablet Take 1 tablet (10 mg total) by mouth at bedtime. 90 tablet 3 Unknown  . Rivaroxaban (XARELTO) 15 MG TABS tablet Take 1 tablet (15 mg total) by mouth daily with supper. 30 tablet 0 UNKNOWN at UNKNOWN  . sodium chloride (OCEAN) 0.65 % SOLN nasal spray Place 2 sprays into both nostrils 4 (four) times daily. (Patient taking differently: Place 2 sprays into both nostrils 2 (two) times daily. ) 90 mL 0 Past Week at Unknown time  . telmisartan (MICARDIS) 80 MG tablet Take 1 tablet (80 mg total) by mouth daily. 90 tablet 3 Unknown  . tolterodine (DETROL LA) 4 MG 24 hr capsule Take 1 capsule (4 mg total) by mouth daily. 90 capsule 3 Unknown  . torsemide (DEMADEX) 20 MG tablet Take 1 tablet (20 mg total) by mouth 2 (two) times daily. 180 tablet 1 Unknown  . Tiotropium Bromide Monohydrate (SPIRIVA RESPIMAT) 1.25 MCG/ACT AERS Inhale 2 puffs into the lungs daily. (Patient not taking: Reported on 10/09/2015) 4 g 11 Not Taking at Unknown time  . [DISCONTINUED] methylPREDNISolone (MEDROL DOSEPAK) 4 MG TBPK tablet TAKE AS DIRECTED (Patient not taking: Reported on 10/09/2015) 21 tablet 0    Scheduled:  . antiseptic oral rinse  7 mL Mouth Rinse QID  . chlorhexidine gluconate (SAGE KIT)  15 mL Mouth Rinse BID  . fluticasone  2 spray Each Nare Daily  . fluticasone furoate-vilanterol  1 puff Inhalation Daily  . insulin aspart  0-9 Units Subcutaneous Q4H  . ipratropium-albuterol  3 mL Nebulization Q6H WA  . [START ON 10/11/2015] levothyroxine  50 mcg Oral QAC breakfast  . loratadine  10 mg Oral Daily  . methylPREDNISolone (SOLU-MEDROL)  injection  40 mg Intravenous Q6H  . montelukast  10 mg Oral QHS  .  moxifloxacin  400 mg Oral Q24H  . pantoprazole  40 mg Oral Daily  . sodium chloride  2 spray Each Nare BID   Infusions:  . sodium chloride 50 mL/hr at 10/10/15 1631  . heparin      Assessment: 58 yoF presented to ED on 6/16 with respiratory distress and AMS.  PMH includes atrial fibrillation on chronic Xarelto.  Xarelto was held after intubtion in ED and pharmacy is consulted for Heparin IV dosing.  PTA Xarelto 15 mg daily w/ supper - pt currently unable to state last time of administration d/t intubation and sedation.  Son was unable to confirm last dose time.  6/17  1100 HL=0.82  note heparin had been off for 1 hour when level drawn due to no IV access  Baseline aPTT 20, baseline HL <0.1  1811 HL=0.70 on 1000 units/hr, no infusion or bleeding complications per RN  Goal of Therapy:  Heparin level 0.3-0.7 units/ml aPTT 66-102 seconds Monitor platelets by anticoagulation protocol: Yes   Plan:   Decrease heparin IV infusion to 900 Units/hr  Repeat HL in 8 hours (am labs)  Daily heparin level and CBC  Continue to monitor H&H and platelets   Dorrene German 10/10/2015, 6:45 PM

## 2015-10-10 NOTE — Progress Notes (Signed)
  Echocardiogram 2D Echocardiogram has been performed.  [name] SavoyCasey N Josceline Chenard 10/10/2015, 10:31 AM

## 2015-10-10 NOTE — Progress Notes (Signed)
PHARMACY - PHYSICIAN COMMUNICATION CRITICAL VALUE ALERT - BLOOD CULTURE IDENTIFICATION (BCID)  Results for orders placed or performed during the hospital encounter of 10/09/15  Blood Culture ID Panel (Reflexed) (Collected: 10/09/2015  3:32 PM)  Result Value Ref Range   Enterococcus species NOT DETECTED NOT DETECTED   Vancomycin resistance NOT DETECTED NOT DETECTED   Listeria monocytogenes NOT DETECTED NOT DETECTED   Staphylococcus species DETECTED (A) NOT DETECTED   Staphylococcus aureus NOT DETECTED NOT DETECTED   Methicillin resistance NOT DETECTED NOT DETECTED   Streptococcus species NOT DETECTED NOT DETECTED   Streptococcus agalactiae NOT DETECTED NOT DETECTED   Streptococcus pneumoniae NOT DETECTED NOT DETECTED   Streptococcus pyogenes NOT DETECTED NOT DETECTED   Acinetobacter baumannii NOT DETECTED NOT DETECTED   Enterobacteriaceae species NOT DETECTED NOT DETECTED   Enterobacter cloacae complex NOT DETECTED NOT DETECTED   Escherichia coli NOT DETECTED NOT DETECTED   Klebsiella oxytoca NOT DETECTED NOT DETECTED   Klebsiella pneumoniae NOT DETECTED NOT DETECTED   Proteus species NOT DETECTED NOT DETECTED   Serratia marcescens NOT DETECTED NOT DETECTED   Carbapenem resistance NOT DETECTED NOT DETECTED   Haemophilus influenzae NOT DETECTED NOT DETECTED   Neisseria meningitidis NOT DETECTED NOT DETECTED   Pseudomonas aeruginosa NOT DETECTED NOT DETECTED   Candida albicans NOT DETECTED NOT DETECTED   Candida glabrata NOT DETECTED NOT DETECTED   Candida krusei NOT DETECTED NOT DETECTED   Candida parapsilosis NOT DETECTED NOT DETECTED   Candida tropicalis NOT DETECTED NOT DETECTED    Name of physician (or Provider) Contacted: Dr. Craige CottaSood  Changes to prescribed antibiotics required: no antibiotic changes required  Dannielle HuhZeigler, Kelicia Youtz George 10/10/2015  5:26 PM

## 2015-10-10 NOTE — Progress Notes (Signed)
Patients son Paris Loredward Cimbler called to get telephone consent. Patient and her son were explained the procedure. Verified consent over the phone with second RN.

## 2015-10-10 NOTE — Procedures (Signed)
Arterial Catheter Insertion Procedure Note [name] [name] [id number] [birth year]  Procedure: Insertion of Arterial Catheter  Indications: Blood pressure monitoring  Procedure Details Consent: Risks of procedure as well as the alternatives and risks of each were explained to the (patient/caregiver).  Consent for procedure obtained. Time Out: Verified patient identification, verified procedure, site/side was marked, verified correct patient position, special equipment/implants available, medications/allergies/relevent history reviewed, required imaging and test results available.  Performed  Maximum sterile technique was used including antiseptics, cap, gloves, gown, hand hygiene, mask and sheet. Skin prep: Chlorhexidine; local anesthetic administered 20 gauge catheter was inserted into right radial artery using the Seldinger technique.  Evaluation Blood flow good; BP tracing good. Complications: No apparent complications.   [name] [name], [name] [name] [name] [name] 10/10/2015

## 2015-10-10 NOTE — Progress Notes (Signed)
PULMONARY / CRITICAL CARE MEDICINE   Name: [name] [name] MRN: [id number] DOB: August 05, 1940    ADMISSION DATE:  10/09/2015 CONSULTATION DATE:  10/09/2015  REFERRING MD:  ED Dr. Vanetta Mulders  CHIEF COMPLAINT:  Severe Asthma Exacerbation  HISTORY OF PRESENT ILLNESS:   Pt known to have chronic asthma with fixed airway obstruction (FEV1 1.61 69%), on Symbicort, Spiriva, nebulizer medicines. She has a lot of allergy to antibiotics and according to the son, she was tested and was found to be tolerant to [facility], presented to the emergency room with unresponsiveness. She saw Dr. Delton Coombes in 2015 and her asthma has been stable until the last couple of days. She saw her primary care doctor yesterday and was prescribed prednisone for acute flareup of asthma. According to the son, patient was not too bad as partial breathing is concerned. It is not sure what happened noon time but I think patient overexerted herself and became severely short of breath. She was seen to be in respiratory distress and unresponsive. She was brought to the emergency room. He was intubated for unresponsiveness and respiratory distress. ABG postintubation showed pH of 7.23, PCO2 61, PO2 of 101 and that's on 40%. Blood pressure is on the soft side with propofol. Propofol has been discontinued. She remains sedated on and off. She is currently on her second and third liters of IV fluids. Still tight with wheezing. She woke up a little bit and she said she just felt short of breath. Denied any chest pain.   SUBJECTIVE: Patient hypotensive overnight and responded to IV fluid bolus. Patient signifies no difficulty breathing on pressure support 0/0 at initiation. Denies any chest pain or pressure. Signifies that she is having some pain from her endotracheal tube.  REVIEW OF SYSTEMS:  Unable to obtain as patient is intubated.  VITAL SIGNS: BP 115/43 mmHg  Pulse 73  Temp(Src) 99 F (37.2 C) (Core (Comment))  Resp 18  Ht 5\' 8"   (1.727 m)  Wt 215 lb 9.8 oz (97.8 kg)  BMI 32.79 kg/m2  SpO2 98%  LMP 12/01/2010  HEMODYNAMICS:    VENTILATOR SETTINGS: Vent Mode:  [-] PRVC FiO2 (%):  [35 %-100 %] 35 % Set Rate:  [15 bmp-20 bmp] 20 bmp Vt Set:  [510 mL] 510 mL PEEP:  [5 cmH20] 5 cmH20 Plateau Pressure:  [16 cmH20-25 cmH20] 16 cmH20  INTAKE / OUTPUT: I/O last 3 completed shifts: In: 1914.2 [I.V.:664.2; IV Piggyback:1250] Out: 700 [Emesis/NG output:700]  PHYSICAL EXAMINATION: General:  Awake. Son at bedside. No distress. Neuro: Follows commands. Nods to questions. Moving all 4 extremities equally. Grossly nonfocal. HEENT:  Endotracheal tube in place. Pupils equal and reactive. No scleral injection or icterus. Cardiovascular:  No appreciable JVD. Regular rate. No edema. Lungs: Prolonged exhalation phase. Normal work of breathing on ventilator pressure support 0/0.  Abdomen:  Soft. Nontender. Mildly protuberant. Normal bowel sounds. Integument: Warm and dry. No rash on exposed skin.  LABS:  BMET  Recent Labs Lab 10/09/15 1622 10/09/15 1650 10/10/15 0344  NA 137 139 139  K 3.3* 3.5 2.9*  CL 100* 99* 110  CO2 28  --  19*  BUN 23* 24* 20  CREATININE 1.33* 1.20* 1.01*  GLUCOSE 324* 309* 227*    Electrolytes  Recent Labs Lab 10/09/15 1600 10/09/15 1622 10/10/15 0344  CALCIUM  --  8.2* 7.7*  MG 2.1  --  1.6*  PHOS 6.6*  --  2.5    CBC  Recent Labs Lab 10/09/15 1622 10/09/15  1650  WBC 19.9*  --   HGB 11.0* 11.2*  HCT 33.3* 33.0*  PLT 234  --     Coag's  Recent Labs Lab 10/09/15 1622 10/09/15 1900 10/10/15 0344  APTT  --  20* 65*  INR 1.23  --   --     Sepsis Markers  Recent Labs Lab 10/09/15 1600 10/09/15 2120 10/10/15 0344  LATICACIDVEN  --  4.4*  --   PROCALCITON <0.10  --  2.04    ABG  Recent Labs Lab 10/09/15 1615 10/09/15 1922 10/10/15 0330  PHART 7.236* 7.379 7.408  PCO2ART 60.6* 39.5 31.6*  PO2ART 101* 83.1 93.7    Liver Enzymes  Recent  Labs Lab 10/09/15 1622  AST 36  ALT 25  ALKPHOS 78  BILITOT 0.8  ALBUMIN 3.3*    Cardiac Enzymes  Recent Labs Lab 10/09/15 2120 10/10/15 0025 10/10/15 0344  TROPONINI 0.35* 0.53* 0.86*    Glucose No results for input(s): GLUCAP in the last 168 hours.  Imaging Ct Head Wo Contrast  10/09/2015  CLINICAL DATA:  Nonresponsive, respiratory distress. EXAM: CT HEAD WITHOUT CONTRAST TECHNIQUE: Contiguous axial images were obtained from the base of the skull through the vertex without intravenous contrast. COMPARISON:  None. FINDINGS: Mild right maxillary sinusitis is noted. Minimal chronic ischemic white matter disease is noted. No mass effect or midline shift is noted. Ventricular size is within normal limits. There is no evidence of mass lesion, hemorrhage or acute infarction. IMPRESSION: Mild right maxillary sinusitis. Minimal chronic ischemic white matter disease. No acute intracranial abnormality seen. Electronically Signed   By: Lupita RaiderJames  Green Jr, M.D.   On: 10/09/2015 16:14   Dg Chest Portable 1 View  10/09/2015  CLINICAL DATA:  Respiratory distress. EXAM: PORTABLE CHEST 1 VIEW COMPARISON:  Radiograph of March 30, 2015. FINDINGS: The heart size and mediastinal contours are within normal limits. No pneumothorax or pleural effusion is noted. Endotracheal tube is seen projected over tracheal air shadow with distal tip 2.5 cm above the carina. Both lungs are clear. The visualized skeletal structures are unremarkable. IMPRESSION: Endotracheal tube in grossly good position. No acute cardiopulmonary abnormality seen. Electronically Signed   By: Lupita RaiderJames  Green Jr, M.D.   On: 10/09/2015 15:48   Dg Abd Portable 1v  10/09/2015  CLINICAL DATA:  Orogastric tube placement. EXAM: PORTABLE ABDOMEN - 1 VIEW COMPARISON:  None. FINDINGS: Enteric catheter descends from the thorax to the central abdomen in a straight line. Lack of gastric bubble, and somewhat unusual course of the catheter make confirmation of  positioning difficult. The bowel gas pattern is nonobstructive. No radio-opaque calculi or other significant radiographic abnormality are seen. IMPRESSION: Enteric catheter descends from the thorax to the central abdomen in a straight line. Although it may be within the gastric body, radiographic confirmation is difficult due to its somewhat unusual course and lack of gastric bubble. Electronically Signed   By: Ted Mcalpineobrinka  Dimitrova M.D.   On: 10/09/2015 22:28    STUDIES:  PFT 07/22/13:  FVC 2.52 L (82%) FEV1 1.61 L (69%) FEV1/FVC 0.64 positive bronchodilator response DLCO uncorrected 79% CT HEAD W/O 6/16: Right maxillary sinusitis. Minimal chronic ischemic white matter disease. No acute intracranial abnormality.  KUB 6/16:  Enteric feeding tube coursing below diaphragm but unable to visualize further. PORT CXR 6/16: Endotracheal tube in good position. Unable to visualize lower lung zones completely. TTE 6/17: EF 65-70%. no regional wall motion abnormalities. Small hyperdynamic LV with small LVOT. RV normal in size and function.  MICROBIOLOGY: MRSA PCR 6/16:  Negative  Blood Ctx x2 6/16 >> Tracheal Asp Ctx >>  ANTIBIOTICS: NONE  SIGNIFICANT EVENTS: 6/16 - Admit for severe asthma exacerbation. Intubated.   LINES/TUBES: OETT 7.5 6/16>> R RAD ART LINE 6/17>> OGT 6/16>> FOLEY 6/16>> PIV x2  ASSESSMENT / PLAN:  PULMONARY A: Acute Hypoxic & Hypercarbic Respiratory Failure Severe Asthma Exacerbation Acute Bronchitis  P:   Full Vent Support Solu-Medrol IV q6hr Duoneb q4hr Continuing Claritin & Singulair D/C Pulmicort & Brovana SBT on PS 0/0 today  CARDIOVASCULAR A:  Hypotension - Resolved with IVF bolus. Propofol causes hypotension. Elevated Troponin I - Suspect demand ischemia. H/O Diastolic CHF H/O Atrial fibrillation H/O Hyperlipidemia H/O HTN . P:  Monitoring on telemetry Vitals per unit protocol Heparin gtt per pharmacy protocol Trending Troponin I q6hr TTE  pending  Consulting Cardiology  RENAL A:   Acute Renal Failure - Resolving. Lactic/Metabolic Acidosis Hypokalemia - Replacing. Hypomagnesemia - Replacing.  P:   Trending UOP with Foley Monitoring electrolytes & renal function daily Replacing electrolytes as indicated Trending LA q6hr KCl VT x1 KCl IV x4 runs Magnesium Sulfate 4gm IV  GASTROINTESTINAL A:   H/O GERD  P:   Pepcid 20 mg IV twice a day NPO Holding on TF  HEMATOLOGIC A:   Leukocytosis - Likely secondary to stress response & acute infection. Anemia - Mild.  Chronic Anticoagulation - Xarelto as outpatient.  P:  Trending cell counts daily w/ CBC Systemic Anticoagulation with heparin gtt per pharmacy protocol  INFECTIOUS A:   Acute Bronchitis Right Maxillary Sinusitis - Holding Flonase.  P:   Starting Aveolx Day#1 Awaiting Tracheal Aspirate Ctx Trending PCT according to algorithm Re-ordering Urine Strep & Legionella Antigens  ENDOCRINE A:   H/O DM Type II - BG elevated. H/O Hypothyroidism  P:   Synthroid VT Checking Hgb A1c SSI per Low Dose Algorithm Accu-Checks q4hr  Holding home Metformin  NEUROLOGIC A:   Sedation on Ventilator  P:   RASS goal: 0 to -1 Holding Propofol Fentanyl IV prn   FAMILY  - Updates:  Son updated by Dr. Jamison Neighbor at length 6/17.   - Inter-disciplinary family meet or Palliative Care meeting due by:  10/16/15  TODAY'S SUMMARY:  75 year old female with history of chronic asthma and fixed airway obstruction. Presenting with severe asthma exacerbation. Patient has an elevated troponin I of unclear significance. I suspect there is some element of demand ischemia. Awaiting transthoracic echocardiogram result. Consulted cardiology for further recommendations. Attempting pressure support trial today. Continuing IV Solu-Medrol and scheduled Duonebs. Discontinuing Flonase, Brovana, and budesonide. Holding on tube feeds in anticipation of extubation and next  24-48 hours.  I have spent a total of 39 minutes of critical care time today caring for the patient, updating family at bedside, and reviewing the patient's electronic medical record.   Donna Christen Jamison Neighbor, M.D. [facility] Pulmonary & Critical Care Pager:  [phone] After 3pm or if no response, call [phone] 10/10/2015, 10:16 AM

## 2015-10-10 NOTE — Progress Notes (Signed)
[facility] for Heparin Indication: atrial fibrillation  Allergies  Allergen Reactions  . Tetracycline Swelling    Swollen tongue  . Acetaminophen Other (See Comments)    ASTHMA MORE SEVERE  . Cardizem [Diltiazem Hcl] Swelling    edema  . Cefaclor Hives, Itching and Rash  . Cephalexin Hives, Itching and Rash  . Clindamycin Hives, Itching and Rash  . Crestor [Rosuvastatin] Other (See Comments)    Muscle aches  . Ibuprofen Other (See Comments)    MAKES ASTHMA WORSE  . Nsaids Other (See Comments)    Respiratory-Asthma WORSE  . Penicillins Hives    Has patient had a PCN reaction causing immediate rash, facial/tongue/throat swelling, SOB or lightheadedness with hypotension: Yes Has patient had a PCN reaction causing severe rash involving mucus membranes or skin necrosis: No Has patient had a PCN reaction that required hospitalization No Has patient had a PCN reaction occurring within the last 10 years: No If all of the above answers are "NO", then may proceed with Cephalosporin use.   . Pravastatin Sodium Other (See Comments)    MYALGIAS  . Simvastatin Other (See Comments)    MYALGIAS  . Sulfonamide Derivatives Hives, Itching and Rash  . Levaquin [Levofloxacin In D5w] Rash  . Lisinopril Cough    Patient Measurements: Height: _0  (172.7 cm) Weight: 215 lb 9.8 oz (97.8 kg) IBW/kg (Calculated) : 63.9  Heparin Dosing Weight: 84.4 kg  Vital Signs: Temp: 99 F (37.2 C) (06/17 0851) Temp Source: Core (Comment) (06/17 0851) BP: 115/43 mmHg (06/17 0851) Pulse Rate: 73 (06/17 0851)  Labs:  Recent Labs  10/09/15 1622 10/09/15 1650 10/09/15 1900  10/10/15 0025 10/10/15 0344 10/10/15 1055  HGB 11.0* 11.2*  --   --   --   --   --   HCT 33.3* 33.0*  --   --   --   --   --   PLT 234  --   --   --   --   --   --   APTT  --   --  20*  --   --  65*  --   LABPROT 15.2  --   --   --   --   --   --   INR 1.23  --   --   --   --   --   --    HEPARINUNFRC  --   --  <0.10*  --   --  0.51 0.82*  CREATININE 1.33* 1.20*  --   --   --  1.01*  --   TROPONINI  --   --   --   < > 0.53* 0.86* 0.64*  < > = values in this interval not displayed.  Estimated Creatinine Clearance: 57.1 mL/min (by C-G formula based on Cr of 1.01).   Medical History: Past Medical History  Diagnosis Date  . Migraine, unspecified, without mention of intractable migraine without mention of status migrainosus   . HLD (hyperlipidemia)   . Aortic insufficiency   . Colon polyps   . Hypothyroidism   . GERD (gastroesophageal reflux disease)   . Osteoarthritis   . HTN (hypertension)   . Depression   . PAF (paroxysmal atrial fibrillation) (HCC)     on coumadin and Cardiazem  . Asthma   . Allergic rhinitis   . Chronic anticoagulation     on coumadin  . Obesity   . Diastolic dysfunction   . S/P  cardiac cath 2003    no obstructive disease     Medications:  Prescriptions prior to admission  Medication Sig Dispense Refill Last Dose  . albuterol (PROAIR HFA) 108 (90 Base) MCG/ACT inhaler Inhale 2 puffs into the lungs every 6 (six) hours as needed for wheezing or shortness of breath. 8.5 each 5 10/09/2015 at Unknown time  . albuterol (PROVENTIL) (2.5 MG/3ML) 0.083% nebulizer solution Take 3 mLs (2.5 mg total) by nebulization every 6 (six) hours as needed for wheezing or shortness of breath. 150 mL 1 Past Month at Unknown time  . budesonide-formoterol (SYMBICORT) 160-4.5 MCG/ACT inhaler Inhale 2 puffs into the lungs 2 (two) times daily. 1 Inhaler 11 Past Month at Unknown time  . esomeprazole (NEXIUM) 40 MG capsule Take 1 capsule (40 mg total) by mouth 2 (two) times daily before a meal. 180 capsule 3 10/09/2015 at Unknown time  . fluticasone (FLONASE) 50 MCG/ACT nasal spray Place 2 sprays into both nostrils daily. 48 g 1 Past Month at Unknown time  . levothyroxine (SYNTHROID) 50 MCG tablet Take 1 tablet (50 mcg total) by mouth daily before breakfast. 90 tablet 3  10/09/2015 at Unknown time  . loratadine (CLARITIN) 10 MG tablet Take 10 mg by mouth daily.   Unknown  . metoprolol tartrate (LOPRESSOR) 25 MG tablet TAKE 1 TABLET (25 MG TOTAL) BY MOUTH 2 (TWO) TIMES DAILY. 180 tablet 3 Unknown at UNKNOWN  . montelukast (SINGULAIR) 10 MG tablet Take 1 tablet (10 mg total) by mouth at bedtime. 90 tablet 3 Unknown  . Rivaroxaban (XARELTO) 15 MG TABS tablet Take 1 tablet (15 mg total) by mouth daily with supper. 30 tablet 0 UNKNOWN at UNKNOWN  . sodium chloride (OCEAN) 0.65 % SOLN nasal spray Place 2 sprays into both nostrils 4 (four) times daily. (Patient taking differently: Place 2 sprays into both nostrils 2 (two) times daily. ) 90 mL 0 Past Week at Unknown time  . telmisartan (MICARDIS) 80 MG tablet Take 1 tablet (80 mg total) by mouth daily. 90 tablet 3 Unknown  . tolterodine (DETROL LA) 4 MG 24 hr capsule Take 1 capsule (4 mg total) by mouth daily. 90 capsule 3 Unknown  . torsemide (DEMADEX) 20 MG tablet Take 1 tablet (20 mg total) by mouth 2 (two) times daily. 180 tablet 1 Unknown  . methylPREDNISolone (MEDROL DOSEPAK) 4 MG TBPK tablet TAKE AS DIRECTED (Patient not taking: Reported on 10/09/2015) 21 tablet 0   . Tiotropium Bromide Monohydrate (SPIRIVA RESPIMAT) 1.25 MCG/ACT AERS Inhale 2 puffs into the lungs daily. (Patient not taking: Reported on 10/09/2015) 4 g 11 Not Taking at Unknown time   Scheduled:  . antiseptic oral rinse  7 mL Mouth Rinse QID  . chlorhexidine gluconate (SAGE KIT)  15 mL Mouth Rinse BID  . famotidine (PEPCID) IV  20 mg Intravenous Q12H  . insulin aspart  0-9 Units Subcutaneous Q4H  . ipratropium-albuterol  3 mL Nebulization Q4H  . levothyroxine  50 mcg Oral QAC breakfast  . loratadine  10 mg Oral Daily  . methylPREDNISolone (SOLU-MEDROL) injection  40 mg Intravenous Q6H  . montelukast  10 mg Oral QHS  . moxifloxacin  400 mg Oral Q24H   Infusions:  . sodium chloride 75 mL/hr at 10/10/15 1200  . heparin 1,150 Units/hr (10/10/15  1112)  . propofol (DIPRIVAN) infusion Stopped (10/10/15 1130)    Assessment: 25 yoF presented to ED on 6/16 with respiratory distress and AMS.  PMH includes atrial fibrillation on chronic Xarelto.  Xarelto  was held after intubtion in ED and pharmacy is consulted for Heparin IV dosing.  PTA Xarelto 15 mg daily w/ supper - pt currently unable to state last time of administration d/t intubation and sedation.  Son was unable to confirm last dose time.  Today, 10/10/2015:  1100 HL=0.82  note heparin had been off for 1 hour when level drawn due to no IV access  Baseline aPTT 20, baseline HL <0.1  No bleeding complications noted per RN  Goal of Therapy:  Heparin level 0.3-0.7 units/ml aPTT 66-102 seconds Monitor platelets by anticoagulation protocol: Yes   Plan:   Decrease heparin IV infusion to 1000 Units/hr  Repeat HL in 6 hours  Daily heparin level and CBC  Continue to monitor H&H and platelets   Dolly Rias RPh 10/10/2015, 12:43 PM Pager [phone]

## 2015-10-11 DIAGNOSIS — J45901 Unspecified asthma with (acute) exacerbation: Secondary | ICD-10-CM | POA: Insufficient documentation

## 2015-10-11 DIAGNOSIS — R778 Other specified abnormalities of plasma proteins: Secondary | ICD-10-CM

## 2015-10-11 DIAGNOSIS — I10 Essential (primary) hypertension: Secondary | ICD-10-CM

## 2015-10-11 DIAGNOSIS — R7989 Other specified abnormal findings of blood chemistry: Secondary | ICD-10-CM

## 2015-10-11 DIAGNOSIS — I480 Paroxysmal atrial fibrillation: Secondary | ICD-10-CM

## 2015-10-11 LAB — PROCALCITONIN: PROCALCITONIN: 1.55 ng/mL

## 2015-10-11 LAB — RENAL FUNCTION PANEL
ALBUMIN: 3 g/dL — AB (ref 3.5–5.0)
ANION GAP: 5 (ref 5–15)
BUN: 19 mg/dL (ref 6–20)
CALCIUM: 8.4 mg/dL — AB (ref 8.9–10.3)
CHLORIDE: 112 mmol/L — AB (ref 101–111)
CO2: 22 mmol/L (ref 22–32)
Creatinine, Ser: 0.88 mg/dL (ref 0.44–1.00)
GFR calc Af Amer: >60 mL/min (ref >60)
GFR calc non Af Amer: >60 mL/min (ref >60)
Glucose, Bld: 150 mg/dL — ABNORMAL HIGH (ref 65–99)
PHOSPHORUS: 2.1 mg/dL — AB (ref 2.5–4.6)
POTASSIUM: 4.3 mmol/L (ref 3.5–5.1)
Sodium: 139 mmol/L (ref 135–145)

## 2015-10-11 LAB — CBC
HCT: 32.4 % — ABNORMAL LOW (ref 36.0–46.0)
Hemoglobin: 10.9 g/dL — ABNORMAL LOW (ref 12.0–15.0)
MCH: 30.4 pg (ref 26.0–34.0)
MCHC: 33.6 g/dL (ref 30.0–36.0)
MCV: 90.3 fL (ref 78.0–100.0)
Platelets: 245 10*3/uL (ref 150–400)
RBC: 3.59 MIL/uL — ABNORMAL LOW (ref 3.87–5.11)
RDW: 15.6 % — ABNORMAL HIGH (ref 11.5–15.5)
WBC: 23.7 10*3/uL — AB (ref 4.0–10.5)

## 2015-10-11 LAB — GLUCOSE, CAPILLARY
GLUCOSE-CAPILLARY: 111 mg/dL — AB (ref 65–99)
GLUCOSE-CAPILLARY: 131 mg/dL — AB (ref 65–99)
GLUCOSE-CAPILLARY: 175 mg/dL — AB (ref 65–99)
Glucose-Capillary: 134 mg/dL — ABNORMAL HIGH (ref 65–99)
Glucose-Capillary: 157 mg/dL — ABNORMAL HIGH (ref 65–99)
Glucose-Capillary: 122 mg/dL — ABNORMAL HIGH (ref 65–99)

## 2015-10-11 LAB — HEPARIN LEVEL (UNFRACTIONATED): Heparin Unfractionated: 0.32 IU/mL (ref 0.30–0.70)

## 2015-10-11 LAB — MAGNESIUM: Magnesium: 2.7 mg/dL — ABNORMAL HIGH (ref 1.7–2.4)

## 2015-10-11 MED ORDER — RIVAROXABAN 20 MG PO TABS
20.0000 mg | ORAL_TABLET | Freq: Every day | ORAL | Status: DC
  Administered 2015-10-12 – 2015-10-14 (×3): 20 mg via ORAL
  Filled 2015-10-11 (×3): qty 1

## 2015-10-11 MED ORDER — METOPROLOL TARTRATE 25 MG PO TABS
25.0000 mg | ORAL_TABLET | Freq: Two times a day (BID) | ORAL | Status: DC
  Administered 2015-10-11 – 2015-10-14 (×7): 25 mg via ORAL
  Filled 2015-10-11 (×7): qty 1

## 2015-10-11 MED ORDER — RIVAROXABAN 20 MG PO TABS
20.0000 mg | ORAL_TABLET | Freq: Once | ORAL | Status: AC
  Administered 2015-10-11: 20 mg via ORAL
  Filled 2015-10-11: qty 1

## 2015-10-11 MED ORDER — INSULIN ASPART 100 UNIT/ML ~~LOC~~ SOLN
0.0000 [IU] | Freq: Three times a day (TID) | SUBCUTANEOUS | Status: DC
  Administered 2015-10-11: 1 [IU] via SUBCUTANEOUS
  Administered 2015-10-11 – 2015-10-12 (×3): 2 [IU] via SUBCUTANEOUS
  Administered 2015-10-13 – 2015-10-14 (×3): 1 [IU] via SUBCUTANEOUS

## 2015-10-11 MED ORDER — IPRATROPIUM-ALBUTEROL 0.5-2.5 (3) MG/3ML IN SOLN
3.0000 mL | Freq: Four times a day (QID) | RESPIRATORY_TRACT | Status: DC
  Administered 2015-10-11 – 2015-10-12 (×4): 3 mL via RESPIRATORY_TRACT
  Filled 2015-10-11 (×4): qty 3

## 2015-10-11 MED ORDER — PREDNISONE 20 MG PO TABS
60.0000 mg | ORAL_TABLET | Freq: Every day | ORAL | Status: DC
  Administered 2015-10-12: 60 mg via ORAL
  Filled 2015-10-11: qty 3

## 2015-10-11 MED ORDER — INSULIN ASPART 100 UNIT/ML ~~LOC~~ SOLN: 0.0000 [IU] | Freq: Every day | SUBCUTANEOUS | Status: DC

## 2015-10-11 MED ORDER — CALCIUM CARBONATE ANTACID 500 MG PO CHEW: 1.0000 | CHEWABLE_TABLET | Freq: Four times a day (QID) | ORAL | Status: DC | PRN

## 2015-10-11 NOTE — Progress Notes (Signed)
Pt alert and oriented X4 however has occasional moments where she forgets and removes equipment. Pt removed IV, new IV placed.

## 2015-10-11 NOTE — Progress Notes (Addendum)
PULMONARY / CRITICAL CARE MEDICINE   Name: [name] [name] MRN: [id number] DOB: [birth year]    ADMISSION DATE:  10/09/2015 CONSULTATION DATE:  10/09/2015  REFERRING MD:  ED Dr. Vanetta MuldersScott Zackowski  CHIEF COMPLAINT:  Severe Asthma Exacerbation  HISTORY OF PRESENT ILLNESS:   Pt known to have chronic asthma with fixed airway obstruction (FEV1 1.61 69%), on Symbicort, Spiriva, nebulizer medicines. She has a lot of allergy to antibiotics and according to the son, she was tested and was [facility], presented to the emergency room with unresponsiveness. She saw Dr. Delton CoombesByrum in 2015 and her asthma has been stable until the last couple of days. She saw her primary care doctor yesterday and was prescribed prednisone for acute flareup of asthma. According to the son, patient was not too bad as partial breathing is concerned. It is not sure what happened noon time but I think patient overexerted herself and became severely short of breath. She was seen to be in respiratory distress and unresponsive. She was brought to the emergency room. He was intubated for unresponsiveness and respiratory distress. ABG postintubation showed pH of 7.23, PCO2 61, PO2 of 101 and that's on 40%. Blood pressure is on the soft side with propofol. Propofol has been discontinued. She remains sedated on and off. She is currently on her second and third liters of IV fluids. Still tight with wheezing. She woke up a little bit and she said she just felt short of breath. Denied any chest pain.   SUBJECTIVE: Successfully extubated yesterday. Reports dyspnea improving. Denies any audible wheezing. Continuing to have intermittent cough. Denies any chest pain or pressure.  REVIEW OF SYSTEMS:  No nausea, vomiting, or abdominal pain. No subjective fever or chills.  VITAL SIGNS: BP 166/70 mmHg  Pulse 72  Temp(Src) 98.2 F (36.8 C) (Core (Comment))  Resp 22  Ht 5\' 8"  (1.727 m)  Wt 217 lb 9.5 oz (98.7 kg)  BMI 33.09 kg/m2   SpO2 98%  LMP 12/01/2010  HEMODYNAMICS:    VENTILATOR SETTINGS: Vent Mode:  [-] PRVC FiO2 (%):  [35 %] 35 % Set Rate:  [20 bmp] 20 bmp Vt Set:  [510 mL] 510 mL PEEP:  [5 cmH20] 5 cmH20 Plateau Pressure:  [16 cmH20] 16 cmH20  INTAKE / OUTPUT: I/O last 3 completed shifts: In: 3498.8 [I.V.:2123.8; NG/GT:75; IV Piggyback:1300] Out: 1575 [Urine:875; Emesis/NG output:700]  PHYSICAL EXAMINATION: General:  Awake. No acute distress. Comfortable. No family at bedside. Neuro: Oriented to person, year, president, and hospital location. Moving all 4 extremities equally. Grossly nonfocal. HEENT:  Moist mucous membranes. No scleral icterus. No oral ulcers. Cardiovascular:  No appreciable JVD. Regular rate. No edema. Lungs: Prolonged exhalation phase but improving aeration bilaterally. Clear to auscultation bilaterally.  Abdomen:  Soft. Nontender. Mildly protuberant. Normal bowel sounds. Integument: Warm and dry. No rash on exposed skin.  LABS:  BMET  Recent Labs Lab 10/09/15 1622 10/09/15 1650 10/10/15 0344 10/11/15 0310  NA 137 139 139 139  K 3.3* 3.5 2.9* 4.3  CL 100* 99* 110 112*  CO2 28  --  19* 22  BUN 23* 24* 20 19  CREATININE 1.33* 1.20* 1.01* 0.88  GLUCOSE 324* 309* 227* 150*    Electrolytes  Recent Labs Lab 10/09/15 1600 10/09/15 1622 10/10/15 0344 10/11/15 0310  CALCIUM  --  8.2* 7.7* 8.4*  MG 2.1  --  1.6* 2.7*  PHOS 6.6*  --  2.5 2.1*    CBC  Recent Labs Lab 10/09/15  1622 10/09/15 1650 10/10/15 1811  WBC 19.9*  --  21.2*  HGB 11.0* 11.2* 10.5*  HCT 33.3* 33.0* 31.2*  PLT 234  --  231    Coag's  Recent Labs Lab 10/09/15 1622 10/09/15 1900 10/10/15 0344  APTT  --  20* 65*  INR 1.23  --   --     Sepsis Markers  Recent Labs Lab 10/09/15 1600 10/09/15 2120 10/10/15 0344 10/10/15 1029 10/11/15 0310  LATICACIDVEN  --  4.4*  --  1.3  --   PROCALCITON <0.10  --  2.04  --  1.55    ABG  Recent Labs Lab 10/09/15 1615  10/09/15 1922 10/10/15 0330  PHART 7.236* 7.379 7.408  PCO2ART 60.6* 39.5 31.6*  PO2ART 101* 83.1 93.7    Liver Enzymes  Recent Labs Lab 10/09/15 1622 10/11/15 0310  AST 36  --   ALT 25  --   ALKPHOS 78  --   BILITOT 0.8  --   ALBUMIN 3.3* 3.0*    Cardiac Enzymes  Recent Labs Lab 10/10/15 1055 10/10/15 1811 10/10/15 2251  TROPONINI 0.64* 0.49* 0.50*    Glucose  Recent Labs Lab 10/10/15 1245 10/10/15 1642 10/10/15 2029 10/10/15 2358 10/11/15 0415 10/11/15 0734  GLUCAP 151* 143* 176* 111* 175* 134*    Imaging No results found.  STUDIES:  PFT 07/22/13:  FVC 2.52 L (82%) FEV1 1.61 L (69%) FEV1/FVC 0.64 positive bronchodilator response DLCO uncorrected 79% CT HEAD W/O 6/16: Right maxillary sinusitis. Minimal chronic ischemic white matter disease. No acute intracranial abnormality.  KUB 6/16:  Enteric feeding tube coursing below diaphragm but unable to visualize further. PORT CXR 6/16: Endotracheal tube in good position. Unable to visualize lower lung zones completely. TTE 6/17: EF 65-70%. no regional wall motion abnormalities. Small hyperdynamic LV with small LVOT. RV normal in size and function.   MICROBIOLOGY: MRSA PCR 6/16:  Negative  Blood Ctx x2 6/16 >> Coag Neg Staph 1/2 bottles Urine Ctx 6/17>> Urine Strep Ag 6/17:  Negative Urine Legionella Ag 6/17>>  ANTIBIOTICS: Avelox 6/17>>  SIGNIFICANT EVENTS: 6/16 - Admit for severe asthma exacerbation. Intubated.   LINES/TUBES: OETT 7.5 6/16 - 6/17 R RAD ART LINE 6/17 - 6/17 OGT 6/16 - 6/17 FOLEY 6/16>> PIV x2  ASSESSMENT / PLAN:  PULMONARY A: Acute Hypoxic & Hypercarbic Respiratory Failure - Improving. Severe Asthma Exacerbation - Improving Acute Bronchitis  P:   Start Prednisone  daily D/C Solu-Medrol Switch Duoneb to q6hr while awake Continuing Claritin & Singulair Incentive Spirometer for Pulmonary Toilette  CARDIOVASCULAR A:  Hypotension - Resolved with IVF bolus. Propofol  causes hypotension. Elevated Troponin I - Suspect demand ischemia. H/O Diastolic CHF H/O Atrial fibrillation H/O Hyperlipidemia H/O HTN . P:  Appreciate Cardiology recommendations & consultation Monitoring on telemetry Vitals per unit protocol Off Heparin gtt Restarted Xarelto 6/18 Plan for outpatient stress test Restarting home Lopressor bid Holding home Torsemide Checking AM Lipid Panel  RENAL A:   Acute Renal Failure - Resolved. Lactic/Metabolic Acidosis - Resolved. Hypokalemia - Resolved. Hypomagnesemia - Resolved.  P:   Trending UOP with Foley Monitoring electrolytes & renal function daily Replacing electrolytes as indicated  GASTROINTESTINAL A:   H/O GERD  P:   Heart Healthy Diet Protonix PO daily  HEMATOLOGIC A:   Leukocytosis - Likely secondary to stress response & acute infection. Anemia - Mild.  Chronic Anticoagulation - Xarelto as outpatient.  P:  Trending cell counts daily w/ CBC SCDs Restarted systemic anticoagulation with Xarelto  INFECTIOUS  A:   Acute Bronchitis Right Maxillary Sinusitis - Holding Flonase.  P:   Avelox Day #2 Awaiting Tracheal Aspirate Ctx Trending PCT according to algorithm Awaiting Legionella Antigen  ENDOCRINE A:   H/O DM Type II - BG controlled. H/O Hypothyroidism  P:   Synthroid qAM Hgb A1c Pending SSI per Low Dose Algorithm Switching Accu-Checks to qAC & HS Holding home Metformin  NEUROLOGIC A:   No acute issues.  P:   Monitor closely. PT Consult   FAMILY  - Updates:  Son updated by Dr. Jamison Neighbor at length 6/17.   - Inter-disciplinary family meet or Palliative Care meeting due by:  10/16/15  TODAY'S SUMMARY:  75 year old female with history of chronic asthma and fixed airway obstruction. Presenting with severe asthma exacerbation. Patient seems to be recovering well from her asthma exacerbation. I am tapering her to prednisone daily. Continuing scheduled nebulizer therapy but increasing time  interval. Holding on initiation of additional inhaled medications at this time. Appreciate cardiology assistance in evaluation of elevated troponin I. Restarting home Lopressor twice daily. Continuing telemetry monitoring and plan for outpatient stress test. Switching patient's Accu-Cheks to every before meals & at bedtime. Continuing to hold metformin for treatment of underlying diabetes while awaiting hemoglobin A1c. Transitioning patient to medical telemetry bed.  TRH to assume care & PCCM to follow as a Research scientist (medical) starting 6/19.  Donna Christen Jamison Neighbor, M.D. [facility] Pulmonary & Critical Care Pager:  [phone] After 3pm or if no response, call [phone] 10/11/2015, 9:15 AM

## 2015-10-11 NOTE — Progress Notes (Signed)
SUBJECTIVE: The patient is doing well today.  "Im coughing up a lot of junk".  At this time, she denies chest pain, worsening shortness of breath, or any new concerns.  . fluticasone  2 spray Each Nare Daily  . fluticasone furoate-vilanterol  1 puff Inhalation Daily  . insulin aspart  0-9 Units Subcutaneous Q4H  . ipratropium-albuterol  3 mL Nebulization TID  . levothyroxine  50 mcg Oral QAC breakfast  . loratadine  10 mg Oral Daily  . methylPREDNISolone (SOLU-MEDROL) injection  40 mg Intravenous Q6H  . montelukast  10 mg Oral QHS  . moxifloxacin  400 mg Oral Q24H  . pantoprazole  40 mg Oral Daily  . sodium chloride  2 spray Each Nare BID   . sodium chloride 50 mL/hr at 10/10/15 1631  . heparin 900 Units/hr (10/10/15 1907)    OBJECTIVE: Physical Exam: Filed Vitals:   10/11/15 0519 10/11/15 0600 10/11/15 0700 10/11/15 0731  BP: 143/64 142/92 166/70   Pulse: 74 75 72   Temp: 98.6 F (37 C) 98.2 F (36.8 C) 98.2 F (36.8 C)   TempSrc:   Core (Comment)   Resp: Height:      Weight:      SpO2: 95% 100% 97% 98%    Intake/Output Summary (Last 24 hours) at 10/11/15 0806 Last data filed at 10/11/15 0400  Gross per 24 hour  Intake 1377.87 ml  Output    875 ml  Net 502.87 ml    Telemetry reveals sinus rhythm  GEN- The patient is well appearing, alert and oriented x 3 today.   Head- normocephalic, atraumatic Eyes-  Sclera clear, conjunctiva pink Ears- hearing intact Oropharynx- clear Neck- supple,   Lungs- few expiratory wheezes, normal work of breathing Heart- Regular rate and rhythm, no murmurs, rubs or gallops, PMI not laterally displaced GI- soft, NT, ND, + BS Extremities- no clubbing, cyanosis, or edema Skin- no rash or lesion Psych- euthymic mood, full affect Neuro- strength and sensation are intact  LABS: Basic Metabolic Panel:  Recent Labs  16/10/96 0344 10/11/15 0310  NA 139 139  K 2.9* 4.3  CL 110 112*  CO2 19* 22  GLUCOSE 227* 150*    BUN 20 19  CREATININE 1.01* 0.88  CALCIUM 7.7* 8.4*  MG 1.6* 2.7*  PHOS 2.5 2.1*   Liver Function Tests:  Recent Labs  10/09/15 1622 10/11/15 0310  AST 36  --   ALT 25  --   ALKPHOS 78  --   BILITOT 0.8  --   PROT 6.2*  --   ALBUMIN 3.3* 3.0*    Recent Labs  10/09/15 1622  LIPASE 28   CBC:  Recent Labs  10/09/15 1622 10/09/15 1650 10/10/15 1811  WBC 19.9*  --  21.2*  NEUTROABS 17.5*  --  19.3*  HGB 11.0* 11.2* 10.5*  HCT 33.3* 33.0* 31.2*  MCV 92.0  --  90.2  PLT 234  --  231   Cardiac Enzymes:  Recent Labs  10/10/15 1055 10/10/15 1811 10/10/15 2251  TROPONINI 0.64* 0.49* 0.50*   Fasting Lipid Panel:  Recent Labs  10/09/15 1922  TRIG 111    ASSESSMENT AND PLAN:   1. Elevated troponin Likely due to demand ischemic in setting of acute medical illness Currently without any symptoms of ACS.  Echo reviewed Would treat acute respiratory failure as you are Could consider stress testing once clinically improved and possibly as an outpatient No need for heparin  drip presently  2. afib Maintaining sinus rhythm Will follow Resume xarelto 20mg  daily at this time (CrCl 80)  3. HTN Stable No change required today  4. Overweight Weight loss advised Body mass index is 32.79 kg/(m^2).   5. Hyperdynamic LV Would not advise TEE at this time Would repeat echo elective once clinically improved as outpatient  Cardiology to follow from afar Please call with questions  Hillis RangeJames Dreama Kuna, MD 10/11/2015 8:06 AM

## 2015-10-11 NOTE — Progress Notes (Signed)
[facility] Transfer Accept Note  This is a no charge tx accept note.  75 yo F with PMHx sig for COPD/asthma, pAF on Xarelto, NIDDM, HTN, hypothyroidism and chronic diastolic CHF, known to me from previous admission for COPD, presents now with 2 weeks worsening dyspnea/cough at home, as well as confusion, admitted two days ago obtunded and hypoxic, intubated for COPD/asthma flare.  Extubated mid-day Saturday, doing well since.      COPD Flare: -PCCM will follow -prednisone, PCCM plans to continue at 60 mg daily and manage taper slowly -scheduled nebs -Avelox -Continue montelukast   HTN: Metoprolol restarted  Elevated troponin: Peak 0.86.  Cards consulted. Echo normal. -Heparin gtt stopped today and restarted Xarelto -Outpatient stress test per Cards  NIDDM: -Sliding scale corrections  Hypothyroidism: -Levothyroxine  Chronic diastolic CHF: EF 16%65% this admit with no wall motion abnlties -Torsemide still held -Daily weights

## 2015-10-11 NOTE — Progress Notes (Signed)
Patient refuses to wear heart monitor. Family (son) at bedside. Educated patient/family on need for cardiac monitoring, they still refuse at this time. Will attempt to place heart monitor again after patient finishes dinner.  Earnest ConroyBrooke M. Clelia CroftShaw, RN

## 2015-10-11 NOTE — Progress Notes (Signed)
ANTICOAGULATION CONSULT NOTE   Pharmacy Consult for Heparin Indication: atrial fibrillation  Allergies  Allergen Reactions  . Tetracycline Swelling    Swollen tongue  . Acetaminophen Other (See Comments)    ASTHMA MORE SEVERE  . Cardizem [Diltiazem Hcl] Swelling    edema  . Cefaclor Hives, Itching and Rash  . Cephalexin Hives, Itching and Rash  . Clindamycin Hives, Itching and Rash  . Crestor [Rosuvastatin] Other (See Comments)    Muscle aches  . Ibuprofen Other (See Comments)    MAKES ASTHMA WORSE  . Nsaids Other (See Comments)    Respiratory-Asthma WORSE  . Penicillins Hives    Has patient had a PCN reaction causing immediate rash, facial/tongue/throat swelling, SOB or lightheadedness with hypotension: Yes Has patient had a PCN reaction causing severe rash involving mucus membranes or skin necrosis: No Has patient had a PCN reaction that required hospitalization No Has patient had a PCN reaction occurring within the last 10 years: No If all of the above answers are "NO", then may proceed with Cephalosporin use.   . Pravastatin Sodium Other (See Comments)    MYALGIAS  . Simvastatin Other (See Comments)    MYALGIAS  . Sulfonamide Derivatives Hives, Itching and Rash  . Levaquin [Levofloxacin In D5w] Rash  . Lisinopril Cough    Patient Measurements: Height: 5\' 8"  (172.7 cm) Weight: 215 lb 9.8 oz (97.8 kg) IBW/kg (Calculated) : 63.9  Heparin Dosing Weight: 84.4 kg  Vital Signs: Temp: 98.1 F (36.7 C) (06/18 0400) Temp Source: Oral (06/17 2359) BP: 115/24 mmHg (06/18 0215) Pulse Rate: 77 (06/18 0400)  Labs:  Recent Labs  10/09/15 1622 10/09/15 1650  10/09/15 1900  10/10/15 0344 10/10/15 1055 10/10/15 1811 10/10/15 2251 10/11/15 0310  HGB 11.0* 11.2*  --   --   --   --   --  10.5*  --   --   HCT 33.3* 33.0*  --   --   --   --   --  31.2*  --   --   PLT 234  --   --   --   --   --   --  231  --   --   APTT  --   --   --  20*  --  65*  --   --   --   --    LABPROT 15.2  --   --   --   --   --   --   --   --   --   INR 1.23  --   --   --   --   --   --   --   --   --   HEPARINUNFRC  --   --   < > <0.10*  --  0.51 0.82* 0.70  --  0.32  CREATININE 1.33* 1.20*  --   --   --  1.01*  --   --   --  0.88  TROPONINI  --   --   --   --   < > 0.86* 0.64* 0.49* 0.50*  --   < > = values in this interval not displayed.  Estimated Creatinine Clearance: 65.5 mL/min (by C-G formula based on Cr of 0.88).   Medical History: Past Medical History  Diagnosis Date  . Migraine, unspecified, without mention of intractable migraine without mention of status migrainosus   . HLD (hyperlipidemia)   . Aortic insufficiency   . Colon polyps   .  Hypothyroidism   . GERD (gastroesophageal reflux disease)   . Osteoarthritis   . HTN (hypertension)   . Depression   . PAF (paroxysmal atrial fibrillation) (HCC)     on coumadin and Cardiazem  . Asthma   . Allergic rhinitis   . Chronic anticoagulation     on coumadin  . Obesity   . Diastolic dysfunction   . S/P cardiac cath 2003    no obstructive disease     Medications:  Prescriptions prior to admission  Medication Sig Dispense Refill Last Dose  . albuterol (PROAIR HFA) 108 (90 Base) MCG/ACT inhaler Inhale 2 puffs into the lungs every 6 (six) hours as needed for wheezing or shortness of breath. 8.5 each 5 10/09/2015 at Unknown time  . albuterol (PROVENTIL) (2.5 MG/3ML) 0.083% nebulizer solution Take 3 mLs (2.5 mg total) by nebulization every 6 (six) hours as needed for wheezing or shortness of breath. 150 mL 1 Past Month at Unknown time  . budesonide-formoterol (SYMBICORT) 160-4.5 MCG/ACT inhaler Inhale 2 puffs into the lungs 2 (two) times daily. 1 Inhaler 11 Past Month at Unknown time  . esomeprazole (NEXIUM) 40 MG capsule Take 1 capsule (40 mg total) by mouth 2 (two) times daily before a meal. 180 capsule 3 10/09/2015 at Unknown time  . fluticasone (FLONASE) 50 MCG/ACT nasal spray Place 2 sprays into both nostrils  daily. 48 g 1 Past Month at Unknown time  . levothyroxine (SYNTHROID) 50 MCG tablet Take 1 tablet (50 mcg total) by mouth daily before breakfast. 90 tablet 3 10/09/2015 at Unknown time  . loratadine (CLARITIN) 10 MG tablet Take 10 mg by mouth daily.   Unknown  . metoprolol tartrate (LOPRESSOR) 25 MG tablet TAKE 1 TABLET (25 MG TOTAL) BY MOUTH 2 (TWO) TIMES DAILY. 180 tablet 3 Unknown at UNKNOWN  . montelukast (SINGULAIR) 10 MG tablet Take 1 tablet (10 mg total) by mouth at bedtime. 90 tablet 3 Unknown  . Rivaroxaban (XARELTO) 15 MG TABS tablet Take 1 tablet (15 mg total) by mouth daily with supper. 30 tablet 0 UNKNOWN at UNKNOWN  . sodium chloride (OCEAN) 0.65 % SOLN nasal spray Place 2 sprays into both nostrils 4 (four) times daily. (Patient taking differently: Place 2 sprays into both nostrils 2 (two) times daily. ) 90 mL 0 Past Week at Unknown time  . telmisartan (MICARDIS) 80 MG tablet Take 1 tablet (80 mg total) by mouth daily. 90 tablet 3 Unknown  . tolterodine (DETROL LA) 4 MG 24 hr capsule Take 1 capsule (4 mg total) by mouth daily. 90 capsule 3 Unknown  . torsemide (DEMADEX) 20 MG tablet Take 1 tablet (20 mg total) by mouth 2 (two) times daily. 180 tablet 1 Unknown  . Tiotropium Bromide Monohydrate (SPIRIVA RESPIMAT) 1.25 MCG/ACT AERS Inhale 2 puffs into the lungs daily. (Patient not taking: Reported on 10/09/2015) 4 g 11 Not Taking at Unknown time  . [DISCONTINUED] methylPREDNISolone (MEDROL DOSEPAK) 4 MG TBPK tablet TAKE AS DIRECTED (Patient not taking: Reported on 10/09/2015) 21 tablet 0    Scheduled:  . fluticasone  2 spray Each Nare Daily  . fluticasone furoate-vilanterol  1 puff Inhalation Daily  . insulin aspart  0-9 Units Subcutaneous Q4H  . ipratropium-albuterol  3 mL Nebulization TID  . levothyroxine  50 mcg Oral QAC breakfast  . loratadine  10 mg Oral Daily  . methylPREDNISolone (SOLU-MEDROL) injection  40 mg Intravenous Q6H  . montelukast  10 mg Oral QHS  . moxifloxacin  400  mg Oral  Q24H  . pantoprazole  40 mg Oral Daily  . sodium chloride  2 spray Each Nare BID   Infusions:  . sodium chloride 50 mL/hr at 10/10/15 1631  . heparin 900 Units/hr (10/10/15 1907)    Assessment: 21 yoF presented to ED on 6/16 with respiratory distress and AMS.  PMH includes atrial fibrillation on chronic Xarelto.  Xarelto was held after intubtion in ED and pharmacy is consulted for Heparin IV dosing.  PTA Xarelto 15 mg daily w/ supper - pt currently unable to state last time of administration d/t intubation and sedation.  Son was unable to confirm last dose time.  6/18  0310 HL=0.32 on heparin @ 900 units/hr  No infusion or bleeding complications noted  Goal of Therapy:  Heparin level 0.3-0.7 units/ml aPTT 66-102 seconds Monitor platelets by anticoagulation protocol: Yes   Plan:   Continue heparin IV infusion @ 900 Units/hr  Repeat HL in 8 hours to confirm therapeutic dose  Will check CBC with 8 hr HL as CBC not drawn with AM labs.  Daily heparin level and CBC  Continue to monitor H&H and platelets  Klynn Linnemann, Joselyn Glassman, PharmD 10/11/2015, 4:13 AM

## 2015-10-11 NOTE — Progress Notes (Signed)
Received patient from ICU. Patient resting comfortably, no complaints or concerns at this time. Agree with previous shift assessment.  Earnest ConroyBrooke M. Clelia CroftShaw, RN

## 2015-10-12 DIAGNOSIS — J4541 Moderate persistent asthma with (acute) exacerbation: Secondary | ICD-10-CM

## 2015-10-12 DIAGNOSIS — R8271 Bacteriuria: Secondary | ICD-10-CM

## 2015-10-12 DIAGNOSIS — J449 Chronic obstructive pulmonary disease, unspecified: Secondary | ICD-10-CM | POA: Insufficient documentation

## 2015-10-12 LAB — RENAL FUNCTION PANEL
ALBUMIN: 2.9 g/dL — AB (ref 3.5–5.0)
Anion gap: 5 (ref 5–15)
BUN: 21 mg/dL — AB (ref 6–20)
CALCIUM: 8.3 mg/dL — AB (ref 8.9–10.3)
CO2: 21 mmol/L — AB (ref 22–32)
CREATININE: 1.05 mg/dL — AB (ref 0.44–1.00)
Chloride: 114 mmol/L — ABNORMAL HIGH (ref 101–111)
GFR calc Af Amer: 58 mL/min — ABNORMAL LOW (ref >60)
GFR calc non Af Amer: 50 mL/min — ABNORMAL LOW (ref >60)
GLUCOSE: 107 mg/dL — AB (ref 65–99)
PHOSPHORUS: 1.7 mg/dL — AB (ref 2.5–4.6)
Potassium: 4.1 mmol/L (ref 3.5–5.1)
SODIUM: 140 mmol/L (ref 135–145)

## 2015-10-12 LAB — LEGIONELLA PNEUMOPHILA SEROGP 1 UR AG: L. pneumophila Serogp 1 Ur Ag: NEGATIVE

## 2015-10-12 LAB — LIPID PANEL
Cholesterol: 162 mg/dL (ref 0–200)
HDL: 51 mg/dL (ref >40)
LDL CALC: 84 mg/dL (ref 0–99)
TRIGLYCERIDES: 136 mg/dL (ref <150)
Total CHOL/HDL Ratio: 3.2 RATIO
VLDL: 27 mg/dL (ref 0–40)

## 2015-10-12 LAB — HEMOGLOBIN A1C
Hgb A1c MFr Bld: 5.9 % — ABNORMAL HIGH (ref 4.8–5.6)
Mean Plasma Glucose: 123 mg/dL

## 2015-10-12 LAB — URINE CULTURE: SPECIAL REQUESTS: NORMAL

## 2015-10-12 LAB — MAGNESIUM: Magnesium: 2.2 mg/dL (ref 1.7–2.4)

## 2015-10-12 LAB — GLUCOSE, CAPILLARY
GLUCOSE-CAPILLARY: 163 mg/dL — AB (ref 65–99)
GLUCOSE-CAPILLARY: 158 mg/dL — AB (ref 65–99)
GLUCOSE-CAPILLARY: 137 mg/dL — AB (ref 65–99)
Glucose-Capillary: 111 mg/dL — ABNORMAL HIGH (ref 65–99)

## 2015-10-12 LAB — CULTURE, BLOOD (ROUTINE X 2)

## 2015-10-12 LAB — CBC
HCT: 32.2 % — ABNORMAL LOW (ref 36.0–46.0)
HEMOGLOBIN: 10.7 g/dL — AB (ref 12.0–15.0)
MCH: 29.8 pg (ref 26.0–34.0)
MCHC: 33.2 g/dL (ref 30.0–36.0)
MCV: 89.7 fL (ref 78.0–100.0)
PLATELETS: 249 10*3/uL (ref 150–400)
RBC: 3.59 MIL/uL — ABNORMAL LOW (ref 3.87–5.11)
RDW: 15.6 % — AB (ref 11.5–15.5)
WBC: 18.5 10*3/uL — ABNORMAL HIGH (ref 4.0–10.5)

## 2015-10-12 MED ORDER — TIOTROPIUM BROMIDE MONOHYDRATE 18 MCG IN CAPS
18.0000 ug | ORAL_CAPSULE | Freq: Every day | RESPIRATORY_TRACT | Status: DC
  Administered 2015-10-12 – 2015-10-14 (×3): 18 ug via RESPIRATORY_TRACT
  Filled 2015-10-12: qty 5

## 2015-10-12 MED ORDER — MOMETASONE FURO-FORMOTEROL FUM 200-5 MCG/ACT IN AERO
2.0000 | INHALATION_SPRAY | Freq: Two times a day (BID) | RESPIRATORY_TRACT | Status: DC
  Administered 2015-10-12 – 2015-10-14 (×4): 2 via RESPIRATORY_TRACT
  Filled 2015-10-12: qty 8.8

## 2015-10-12 MED ORDER — HYDRALAZINE HCL 20 MG/ML IJ SOLN: 10.0000 mg | Freq: Four times a day (QID) | INTRAMUSCULAR | Status: DC | PRN

## 2015-10-12 MED ORDER — K PHOS MONO-SOD PHOS DI & MONO 155-852-130 MG PO TABS
500.0000 mg | ORAL_TABLET | Freq: Two times a day (BID) | ORAL | Status: AC
  Administered 2015-10-12 (×2): 500 mg via ORAL
  Filled 2015-10-12 (×2): qty 2

## 2015-10-12 MED ORDER — TRAMADOL HCL 50 MG PO TABS
50.0000 mg | ORAL_TABLET | Freq: Three times a day (TID) | ORAL | Status: DC | PRN
  Administered 2015-10-12: 50 mg via ORAL
  Filled 2015-10-12: qty 1

## 2015-10-12 MED ORDER — DM-GUAIFENESIN ER 30-600 MG PO TB12
1.0000 | ORAL_TABLET | Freq: Two times a day (BID) | ORAL | Status: DC
  Administered 2015-10-12 – 2015-10-14 (×5): 1 via ORAL
  Filled 2015-10-12 (×5): qty 1

## 2015-10-12 MED ORDER — PREDNISONE 20 MG PO TABS
40.0000 mg | ORAL_TABLET | Freq: Every day | ORAL | Status: DC
  Administered 2015-10-13 – 2015-10-14 (×2): 40 mg via ORAL
  Filled 2015-10-12 (×2): qty 2

## 2015-10-12 NOTE — Progress Notes (Addendum)
PROGRESS NOTE    [name] [name]  ZHY:[id number] DOB: 1940/07/06 DOA: 10/09/2015 PCP: Sanda Lingerhomas Jones, MD    Brief Narrative: This is a no charge tx accept note. 75 yo F with PMHx sig for COPD/asthma, pAF on Xarelto, NIDDM, HTN, hypothyroidism and chronic diastolic CHF, known to me from previous admission for COPD, presents now with 2 weeks worsening dyspnea/cough at home, as well as confusion, admitted two days ago obtunded and hypoxic, intubated for COPD/asthma flare.  Extubated mid-day Saturday, doing well since.    Assessment & Plan:   Principal Problem:   Elevated troponin I level Active Problems:   Respiratory failure (HCC)   Asthma with acute exacerbation  Acute Hypoxic & Hypercarbic Respiratory Failure - Improving. Severe Asthma Exacerbation - Improving -PCCM will follow  -prednisone, PCCM plans to continue at 60 mg daily and manage taper slowly -scheduled nebs -Avelox -Continue montelukast guaifenesin for cough.   Hypophosphatemia;  Replete with K phosphorus.   AKI; continue with IV fluids.  strict I and o.  Hold diuretics.   Blood Ctx x2 6/16 >> Coag Neg Staph 1/2 bottles, probably a contaminant.   Leukocytosis; related to steroids , infection. Trending down.   Acute Renal Failure - Resolved. Lactic/Metabolic Acidosis - Resolved  HTN: Metoprolol restarted PRN hydralazine.   Chronic Anticoagulation - Xarelto as outpatient  Elevated troponin: Peak 0.86. Cards consulted. Echo normal. -Heparin gtt stopped today and restarted Xarelto -Outpatient stress test per Cards  Chronic diastolic CHF: EF 29%65% this admit with no wall motion abnlties -Torsemide still held -Daily weights  NIDDM: -Sliding scale corrections  Hypothyroidism: -Levothyroxine  DVT prophylaxis: on xarelto Code Status: full code,  Family Communication: care discussed with patient.  Disposition Plan: home in 1 to 2 days   Consultants:   Cardiology   Procedures:    none  Antimicrobials:  Avelox   Subjective: She is breathing better, report cough,  Denies dysuria, was having trouble controlling her urine.   Objective: Filed Vitals:   10/11/15 2141 10/12/15 0509 10/12/15 0651 10/12/15 0759  BP: 136/61 189/72 171/74   Pulse: 67 70    Temp: 98.5 F (36.9 C) 98.9 F (37.2 C)    TempSrc: Oral Oral    Resp: 18 18    Height:      Weight:  103.9 kg (229 lb 0.9 oz)    SpO2: 97% 93%  90%    Intake/Output Summary (Last 24 hours) at 10/12/15 0945 Last data filed at 10/12/15 0700  Gross per 24 hour  Intake   1590 ml  Output   1375 ml  Net    215 ml   Filed Weights   10/11/15 0416 10/11/15 1218 10/12/15 0509  Weight: 98.7 kg (217 lb 9.5 oz) 101.4 kg (223 lb 8.7 oz) 103.9 kg (229 lb 0.9 oz)    Examination:  General exam: Appears calm and comfortable  Respiratory system: Clear to auscultation. Respiratory effort normal. Cardiovascular system: S1 & S2 heard, RRR. No JVD, murmurs, rubs, gallops or clicks. No pedal edema. Gastrointestinal system: Abdomen is nondistended, soft and nontender. No organomegaly or masses felt. Normal bowel sounds heard. Central nervous system: Alert and oriented. No focal neurological deficits. Extremities: Symmetric 5 x 5 power. Skin: No rashes, lesions or ulcers Psychiatry: Judgement and insight appear normal. Mood & affect appropriate.     Data Reviewed: I have personally reviewed following labs and imaging studies  CBC:  Recent Labs Lab 10/09/15 1622 10/09/15 1650 10/10/15 1811 10/11/15 1126 10/12/15 0433  WBC 19.9*  --  21.2* 23.7* 18.5*  NEUTROABS 17.5*  --  19.3*  --   --   HGB 11.0* 11.2* 10.5* 10.9* 10.7*  HCT 33.3* 33.0* 31.2* 32.4* 32.2*  MCV 92.0  --  90.2 90.3 89.7  PLT 234  --  231 245 249   Basic Metabolic Panel:  Recent Labs Lab 10/09/15 1600 10/09/15 1622 10/09/15 1650 10/10/15 0344 10/11/15 0310 10/12/15 0433  NA  --  137 139 139 139 140  K  --  3.3* 3.5 2.9* 4.3 4.1   CL  --  100* 99* 110 112* 114*  CO2  --  28  --  19* 22 21*  GLUCOSE  --  324* 309* 227* 150* 107*  BUN  --  23* 24* 20 19 21*  CREATININE  --  1.33* 1.20* 1.01* 0.88 1.05*  CALCIUM  --  8.2*  --  7.7* 8.4* 8.3*  MG 2.1  --   --  1.6* 2.7* 2.2  PHOS 6.6*  --   --  2.5 2.1* 1.7*   GFR: Estimated Creatinine Clearance: 56.6 mL/min (by C-G formula based on Cr of 1.05). Liver Function Tests:  Recent Labs Lab 10/09/15 1622 10/11/15 0310 10/12/15 0433  AST 36  --   --   ALT 25  --   --   ALKPHOS 78  --   --   BILITOT 0.8  --   --   PROT 6.2*  --   --   ALBUMIN 3.3* 3.0* 2.9*    Recent Labs Lab 10/09/15 1622  LIPASE 28   No results for input(s): AMMONIA in the last 168 hours. Coagulation Profile:  Recent Labs Lab 10/09/15 1622  INR 1.23   Cardiac Enzymes:  Recent Labs Lab 10/10/15 0025 10/10/15 0344 10/10/15 1055 10/10/15 1811 10/10/15 2251  TROPONINI 0.53* 0.86* 0.64* 0.49* 0.50*   BNP (last 3 results)  Recent Labs  03/23/15 1350  PROBNP 78.0   HbA1C: No results for input(s): HGBA1C in the last 72 hours. CBG:  Recent Labs Lab 10/11/15 0734 10/11/15 1127 10/11/15 1657 10/11/15 2209 10/12/15 0813  GLUCAP 134* 131* 157* 122* 111*   Lipid Profile:  Recent Labs  10/09/15 1922 10/12/15 0433  CHOL  --  162  HDL  --  51  LDLCALC  --  84  TRIG 111 136  CHOLHDL  --  3.2   Thyroid Function Tests: No results for input(s): TSH, T4TOTAL, FREET4, T3FREE, THYROIDAB in the last 72 hours. Anemia Panel: No results for input(s): VITAMINB12, FOLATE, FERRITIN, TIBC, IRON, RETICCTPCT in the last 72 hours. Sepsis Labs:  Recent Labs Lab 10/09/15 1600 10/09/15 2120 10/10/15 0344 10/10/15 1029 10/11/15 0310  PROCALCITON <0.10  --  2.04  --  1.55  LATICACIDVEN  --  4.4*  --  1.3  --     Recent Results (from the past 240 hour(s))  Culture, blood (routine x 2)     Status: Abnormal   Collection Time: 10/09/15  3:32 PM  Result Value Ref Range Status    Specimen Description BLOOD LEFT ANTECUBITAL  Final   Special Requests BOTTLES DRAWN AEROBIC ONLY 5CC  Final   Culture  Setup Time   Final    GRAM POSITIVE COCCI IN CLUSTERS AEROBIC BOTTLE ONLY CRITICAL RESULT CALLED TO, READ BACK BY AND VERIFIED WITH: D ZEIGLER,PHARMD AT 1709 10/10/15 BY L BENFIELD    Culture (A)  Final    STAPHYLOCOCCUS SPECIES (COAGULASE NEGATIVE) THE SIGNIFICANCE OF ISOLATING  THIS ORGANISM FROM A SINGLE SET OF BLOOD CULTURES WHEN MULTIPLE SETS ARE DRAWN IS UNCERTAIN. PLEASE NOTIFY THE MICROBIOLOGY DEPARTMENT WITHIN ONE WEEK IF SPECIATION AND SENSITIVITIES ARE REQUIRED. Performed at [facility]    Report Status 10/12/2015 FINAL  Final  Blood Culture ID Panel (Reflexed)     Status: Abnormal   Collection Time: 10/09/15  3:32 PM  Result Value Ref Range Status   Enterococcus species NOT DETECTED NOT DETECTED Final   Vancomycin resistance NOT DETECTED NOT DETECTED Final   Listeria monocytogenes NOT DETECTED NOT DETECTED Final   Staphylococcus species DETECTED (A) NOT DETECTED Final    Comment: CRITICAL RESULT CALLED TO, READ BACK BY AND VERIFIED WITH: D ZEIGLER,PHARMD AT 1709 10/10/15 BY L BENFIELD    Staphylococcus aureus NOT DETECTED NOT DETECTED Final   Methicillin resistance NOT DETECTED NOT DETECTED Final   Streptococcus species NOT DETECTED NOT DETECTED Final   Streptococcus agalactiae NOT DETECTED NOT DETECTED Final   Streptococcus pneumoniae NOT DETECTED NOT DETECTED Final   Streptococcus pyogenes NOT DETECTED NOT DETECTED Final   Acinetobacter baumannii NOT DETECTED NOT DETECTED Final   Enterobacteriaceae species NOT DETECTED NOT DETECTED Final   Enterobacter cloacae complex NOT DETECTED NOT DETECTED Final   Escherichia coli NOT DETECTED NOT DETECTED Final   Klebsiella oxytoca NOT DETECTED NOT DETECTED Final   Klebsiella pneumoniae NOT DETECTED NOT DETECTED Final   Proteus species NOT DETECTED NOT DETECTED Final   Serratia marcescens NOT DETECTED NOT  DETECTED Final   Carbapenem resistance NOT DETECTED NOT DETECTED Final   Haemophilus influenzae NOT DETECTED NOT DETECTED Final   Neisseria meningitidis NOT DETECTED NOT DETECTED Final   Pseudomonas aeruginosa NOT DETECTED NOT DETECTED Final   Candida albicans NOT DETECTED NOT DETECTED Final   Candida glabrata NOT DETECTED NOT DETECTED Final   Candida krusei NOT DETECTED NOT DETECTED Final   Candida parapsilosis NOT DETECTED NOT DETECTED Final   Candida tropicalis NOT DETECTED NOT DETECTED Final    Comment: Performed at [facility]  Culture, blood (routine x 2)     Status: None (Preliminary result)   Collection Time: 10/09/15  4:40 PM  Result Value Ref Range Status   Specimen Description BLOOD LEFT FOREARM  Final   Special Requests IN PEDIATRIC BOTTLE 3CC  Final   Culture   Final    NO GROWTH 2 DAYS Performed at [facility]    Report Status PENDING  Incomplete  MRSA PCR Screening     Status: None   Collection Time: 10/09/15  8:21 PM  Result Value Ref Range Status   MRSA by PCR NEGATIVE NEGATIVE Final    Comment:        The GeneXpert MRSA Assay (FDA approved for NASAL specimens only), is one component of a comprehensive MRSA colonization surveillance program. It is not intended to diagnose MRSA infection nor to guide or monitor treatment for MRSA infections.   Urine culture     Status: Abnormal   Collection Time: 10/10/15  1:16 PM  Result Value Ref Range Status   Specimen Description URINE, CATHETERIZED  Final   Special Requests Normal  Final   Culture >=100,000 COLONIES/mL ESCHERICHIA COLI (A)  Final   Report Status 10/12/2015 FINAL  Final   Organism ID, Bacteria ESCHERICHIA COLI (A)  Final      Susceptibility   Escherichia coli - MIC*    AMPICILLIN <=2 SENSITIVE Sensitive     CEFAZOLIN <=4 SENSITIVE Sensitive     CEFTRIAXONE <=1  SENSITIVE Sensitive     CIPROFLOXACIN <=0.25 SENSITIVE Sensitive     GENTAMICIN <=1 SENSITIVE Sensitive     IMIPENEM  <=0.25 SENSITIVE Sensitive     NITROFURANTOIN <=16 SENSITIVE Sensitive     TRIMETH/SULFA <=20 SENSITIVE Sensitive     AMPICILLIN/SULBACTAM <=2 SENSITIVE Sensitive     PIP/TAZO <=4 SENSITIVE Sensitive     * >=100,000 COLONIES/mL ESCHERICHIA COLI         Radiology Studies: No results found.      Scheduled Meds: . dextromethorphan-guaiFENesin  1 tablet Oral BID  . insulin aspart  0-5 Units Subcutaneous QHS  . insulin aspart  0-9 Units Subcutaneous TID WC  . ipratropium-albuterol  3 mL Nebulization Q6H WA  . levothyroxine  50 mcg Oral QAC breakfast  . loratadine  10 mg Oral Daily  . metoprolol tartrate  25 mg Oral BID  . montelukast  10 mg Oral QHS  . moxifloxacin  400 mg Oral Q24H  . pantoprazole  40 mg Oral Daily  . predniSONE  60 mg Oral Q breakfast  . rivaroxaban  20 mg Oral Q breakfast  . sodium chloride  2 spray Each Nare BID   Continuous Infusions: . sodium chloride 50 mL/hr at 10/12/15 0619     LOS: 3 days    Time spent: 35 minutes.     Alba Cory, MD [facility] Pager [phone]  If 7PM-7AM, please contact night-coverage [email or web address] Password [email or web address] 10/12/2015, 9:45 AM

## 2015-10-12 NOTE — NC FL2 (Signed)
[place] MEDICAID FL2 LEVEL OF CARE SCREENING TOOL     IDENTIFICATION  Patient Name: [name] [name] Birthdate: 1941-03-04 Sex: female Admission Date (Current Location): 10/09/2015  [facility] and [place] Number:  Producer, television/film/video and Address:  [facility],  [address], [place] [address]      Provider Number: [id number]  Attending Physician Name and Address:  Alba Cory, MD  Relative Name and Phone Number:       Current Level of Care: Hospital Recommended Level of Care: Skilled Nursing Facility Prior Approval Number:    Date Approved/Denied:   PASRR Number: [id number]  Discharge Plan: Home    Current Diagnoses: Patient Active Problem List   Diagnosis Date Noted  . Chronic obstructive pulmonary disease (HCC)   . Asymptomatic bacteriuria   . Asthma with acute exacerbation   . Elevated troponin I level   . Respiratory failure (HCC) 10/09/2015  . Acute respiratory failure with hypoxia (HCC)   . Altered mental status   . Asthma exacerbation   . Non-seasonal allergic rhinitis due to pollen 03/23/2015  . Chronic anticoagulation 01/20/2015  . Upper airway cough syndrome 08/23/2013  . LPRD (laryngopharyngeal reflux disease) 08/23/2013  . Encounter for therapeutic drug monitoring 05/17/2013  . OAB (overactive bladder) 01/24/2012  . Diastolic dysfunction 09/07/2011  . Other screening mammogram 09/01/2011  . Type II diabetes mellitus with manifestations (HCC) 05/04/2011  . Hyperlipidemia with target LDL less than 100 08/30/2007  . Hypothyroidism 12/06/2006  . DEPRESSION 12/06/2006  . Essential hypertension 12/06/2006  . Aortic valve disorder 12/06/2006  . [place]ath-induced atrial fibrillation 12/06/2006  . Allergic rhinitis 12/06/2006  . Asthma, mild intermittent 12/06/2006  . GERD 12/06/2006  . OSTEOARTHRITIS 12/06/2006    Orientation RESPIRATION BLADDER Height & Weight     Self, Time, Situation  Normal Incontinent Weight: 229 lb  0.9 oz (103.9 kg) Height:   (172.7 cm)  BEHAVIORAL SYMPTOMS/MOOD NEUROLOGICAL BOWEL NUTRITION STATUS      Continent Diet (Heart)  AMBULATORY STATUS COMMUNICATION OF NEEDS Skin   Extensive Assist Verbally Normal                       Personal Care Assistance Level of Assistance  Bathing, Dressing Bathing Assistance: Limited assistance   Dressing Assistance: Limited assistance     Functional Limitations Info             SPECIAL CARE FACTORS FREQUENCY  PT (By licensed PT), OT (By licensed OT)     PT Frequency: 5 OT Frequency: 5            Contractures      Additional Factors Info  Code Status, Allergies Code Status Info: Fullcode Allergies Info: Allergies:  Tetracycline, Acetaminophen, Cardizem, Cefaclor, Cephalexin, Clindamycin, Crestor, Ibuprofen, Nsaids, Penicillins, Pravastatin Sodium, Simvastatin, Sulfonamide Derivatives, Levaquin, Lisinopril           Current Medications (10/12/2015):  This is the current hospital active medication list Current Facility-Administered Medications  Medication Dose Route Frequency Provider Last Rate Last Dose  . 0.9 %  sodium chloride infusion   Intravenous Continuous Coralyn Helling, MD 50 mL/hr at 10/12/15 0619    . 0.9 %  sodium chloride infusion  250 mL Intravenous PRN Jose Angelo A Christene Slates, MD      . calcium carbonate (TUMS - dosed in mg elemental calcium) chewable tablet 200 mg of elemental calcium  1 tablet Oral QID PRN Zigmund Gottron, MD      .  dextromethorphan-guaiFENesin (MUCINEX DM) 30-600 MG per 12 hr tablet 1 tablet  1 tablet Oral BID Belkys A Regalado, MD   1 tablet at 10/12/15 1041  . hydrALAZINE (APRESOLINE) injection 10 mg  10 mg Intravenous Q6H PRN Belkys A Regalado, MD      . insulin aspart (novoLOG) injection 0-5 Units  0-5 Units Subcutaneous QHS Roslynn AmbleJennings E Nestor, MD   0 Units at 10/11/15 2200  . insulin aspart (novoLOG) injection 0-9 Units  0-9 Units Subcutaneous TID WC Roslynn AmbleJennings E Nestor, MD   2  Units at 10/12/15 1200  . levothyroxine (SYNTHROID, LEVOTHROID) tablet 50 mcg  50 mcg Oral QAC breakfast Coralyn HellingVineet Sood, MD   50 mcg at 10/12/15 [id number]  . loratadine (CLARITIN) tablet 10 mg  10 mg Oral Daily Coralyn HellingVineet Sood, MD   10 mg at 10/11/15 0921  . metoprolol tartrate (LOPRESSOR) tablet 25 mg  25 mg Oral BID Roslynn AmbleJennings E Nestor, MD   25 mg at 10/12/15 1042  . mometasone-formoterol (DULERA) 200-5 MCG/ACT inhaler 2 puff  2 puff Inhalation BID Simonne MartinetPeter E Babcock, NP      . montelukast (SINGULAIR) tablet 10 mg  10 mg Oral QHS Coralyn HellingVineet Sood, MD   10 mg at 10/11/15 2227  . moxifloxacin (AVELOX) tablet 400 mg  400 mg Oral Q24H Roslynn AmbleJennings E Nestor, MD   400 mg at 10/11/15 1735  . pantoprazole (PROTONIX) EC tablet 40 mg  40 mg Oral Daily Coralyn HellingVineet Sood, MD   40 mg at 10/12/15 1041  . phosphorus (K PHOS NEUTRAL) tablet 500 mg  500 mg Oral BID Belkys A Regalado, MD   500 mg at 10/12/15 1043  . [START ON 10/13/2015] predniSONE (DELTASONE) tablet 40 mg  40 mg Oral Q breakfast Simonne MartinetPeter E Babcock, NP      . rivaroxaban Carlena Hurl(XARELTO) tablet 20 mg  20 mg Oral Q breakfast Hillis RangeJames Allred, MD   20 mg at 10/12/15 [id number]  . sodium chloride (OCEAN) 0.65 % nasal spray 2 spray  2 spray Each Nare BID Coralyn HellingVineet Sood, MD   2 spray at 10/12/15 1044  . tiotropium (SPIRIVA) inhalation capsule 18 mcg  18 mcg Inhalation Daily Simonne MartinetPeter E Babcock, NP   18 mcg at 10/12/15 1459  . traMADol (ULTRAM) tablet 50 mg  50 mg Oral Q8H PRN Alba CoryBelkys A Regalado, MD         Discharge Medications: Please see discharge summary for a list of discharge medications.  Relevant Imaging Results:  Relevant Lab Results:   Additional Information SSN: [id number]  [name], Frederika Hukill F, LCSW

## 2015-10-12 NOTE — Evaluation (Signed)
Physical Therapy Evaluation Patient Details Name: [name] [name] MRN: [id number] DOB: May 19, 1940 Today's Date: 10/12/2015   History of Present Illness  75 yo female admittd with respiratory failure, extubated 6/17. hx of COPD, HTN, PAF, obesity, OA, asthma, DM, CHF  Clinical Impression  On eval, pt required Min assist for mobility-walked ~30 feet with RW. Dyspnea 2-3/4, O2 sats 88% on RA. Pt fatigues very quickly. At this time, do not feel pt will be able to safely/efficiently manage at home alone. Recommend ST rehab unless pt can arrange for assistance in home.    Follow Up Recommendations SNF;Supervision/Assistance - 24 hour (unless pt can arrange for assistance in home)    Equipment Recommendations  None recommended by PT    Recommendations for Other Services       Precautions / Restrictions Precautions Precautions: Fall Precaution Comments: monitor O2 sats Restrictions Weight Bearing Restrictions: No      Mobility  Bed Mobility Overal bed mobility: Needs Assistance Bed Mobility: Supine to Sit;Sit to Supine     Supine to sit: Min guard;HOB elevated Sit to supine: Min guard;HOB elevated   General bed mobility comments: Increased time. close guard for safety.   Transfers Overall transfer level: Needs assistance Equipment used: Rolling walker (2 wheeled) Transfers: Sit to/from Stand Sit to Stand: Min assist;From elevated surface         General transfer comment: assist to rise, stabilize, control descent. VCs safety, technique, hand placement. Encouraged pt to perform task unassisted but pt insisted on having tech give her one hand to pull on.   Ambulation/Gait Ambulation/Gait assistance: Min assist Ambulation Distance (Feet): 30 Feet (10'x2) Assistive device: Rolling walker (2 wheeled) Gait Pattern/deviations: Step-through pattern;Decreased stride length     General Gait Details: Assist to stabilize pt. slow gait speed. pt fatigues very easily. dyspnea  2-3/4, O2 sats 88% on RA.   Stairs            Wheelchair Mobility    Modified Rankin (Stroke Patients Only)       Balance Overall balance assessment: Needs assistance         Standing balance support: During functional activity Standing balance-Leahy Scale: Poor                               Pertinent Vitals/Pain Pain Assessment: Faces Faces Pain Scale: Hurts little more Pain Location: genralized pain "all over" Pain Intervention(s): Limited activity within patient's tolerance;Repositioned    Home Living Family/patient expects to be discharged to:: Private residence Living Arrangements: Alone Available Help at Discharge: Family;Available PRN/intermittently Type of Home: House Home Access: Stairs to enter   Entrance Stairs-Number of Steps: 1 then 2 more Home Layout: One level Home Equipment: Walker - 4 wheels;Tub bench;Cane - single point      Prior Function Level of Independence: Independent               Hand Dominance        Extremity/Trunk Assessment   Upper Extremity Assessment: Generalized weakness           Lower Extremity Assessment: Generalized weakness      Cervical / Trunk Assessment: Normal  Communication   Communication: No difficulties  Cognition Arousal/Alertness: Awake/alert Behavior During Therapy: WFL for tasks assessed/performed Overall Cognitive Status: Within Functional Limits for tasks assessed                      General Comments  Exercises        Assessment/Plan    PT Assessment Patient needs continued PT services  PT Diagnosis Difficulty walking;Generalized weakness   PT Problem List Decreased strength;Decreased activity tolerance;Decreased balance;Decreased mobility;Obesity;Decreased knowledge of use of DME  PT Treatment Interventions DME instruction;Gait training;Functional mobility training;Therapeutic activities;Therapeutic exercise;Balance training;Patient/family  education   PT Goals (Current goals can be found in the Care Plan section) Acute Rehab PT Goals Patient Stated Goal: none stated PT Goal Formulation: With patient Time For Goal Achievement: 10/26/15 Potential to Achieve Goals: Good    Frequency Min 3X/week   Barriers to discharge        Co-evaluation               End of Session   Activity Tolerance: Patient limited by fatigue Patient left: in bed;with call bell/phone within reach;with bed alarm set           Time: [id number] PT Time Calculation (min) (ACUTE ONLY): 30 min   Charges:   PT Evaluation $PT Eval Low Complexity: 1 Procedure PT Treatments $Gait Training: 8-22 mins   PT G Codes:        Rebeca AlertJannie Derion Kreiter, MPT Pager: [phone]

## 2015-10-12 NOTE — Progress Notes (Signed)
Removed foley cath at 06:17 this am. Patient due to void, will continue to monitor patient.

## 2015-10-12 NOTE — Clinical Social Work Note (Signed)
Clinical Social Work Assessment  Patient Details  Name: [name] [name] MRN: [id number] Date of Birth: Feb 08, 1941  Date of referral:  10/12/15               Reason for consult:  Facility Placement                Permission sought to share information with:  Oceanographer[facility]ility Contact Representative Permission granted to share information::  Yes, Verbal Permission Granted  Name::        Agency::     Relationship::     Contact Information:     Housing/Transportation Living arrangements for the past 2 months:  Single Family Home Source of Information:  Adult Children Patient Interpreter Needed:  None Criminal Activity/Legal Involvement Pertinent to Current Situation/Hospitalization:  No - Comment as needed Significant Relationships:  Adult Children Lives with:  Self Do you feel safe going back to the place where you live?  No Need for family participation in patient care:  Yes (Comment)  Care giving concerns:  CSW reviewed PT evaluation recommending SNF at discharge.    Social Worker assessment / plan:  CSW spoke with patient's son, [name] [name] via phone re: discharge planning. Son is agreeable with plan for SNF.   Employment status:  Retired Health and safety inspectornsurance information:  Medicare PT Recommendations:  Skilled Nursing Facility Information / Referral to community resources:  Skilled Nursing Facility  Patient/Family's Response to care:  [facility]. Patient's son informed CSW that patient has not been to a SNF before but that her [name]. CSW to follow-up with SNF bed offers tomorrow.   Patient/Family's Understanding of and Emotional Response to Diagnosis, Current Treatment, and Prognosis:    Emotional Assessment Appearance:  Appears stated age Attitude/Demeanor/Rapport:    Affect (typically observed):    Orientation:  Oriented to Self, Oriented to Place Alcohol / Substance use:    Psych  involvement (Current and /or in the community):     Discharge Needs  Concerns to be addressed:    Readmission within the last 30 days:    Current discharge risk:    Barriers to Discharge:      [name] [name], [name] Marsiglia F, LCSW 10/12/2015, 3:47 PM

## 2015-10-12 NOTE — Progress Notes (Signed)
Patient Name: [name] CarryMargaret M [name] Date of Encounter: 10/12/2015  Primary Cardiologist: Dr. Jens Somrenshaw  Patient profile: 75 yo caucasian female with h/o asthma presented with asthma exacerbation requiring intubation. Trop mildly elevated. EKG sinus tach with diffuse ST depression    Principal Problem:   Elevated troponin I level Active Problems:   Respiratory failure (HCC)   Asthma with acute exacerbation    SUBJECTIVE  Ashma improving. Coughing less. Says her 2nd husband died 8 weeks ago, her son and his GF has been visiting her on a daily basis. She does not use O2 at home. Previously followed by Dr. Jens Somrenshaw and Lawson FiscalLori  CURRENT MEDS . dextromethorphan-guaiFENesin  1 tablet Oral BID  . insulin aspart  0-5 Units Subcutaneous QHS  . insulin aspart  0-9 Units Subcutaneous TID WC  . ipratropium-albuterol  3 mL Nebulization Q6H WA  . levothyroxine  50 mcg Oral QAC breakfast  . loratadine  10 mg Oral Daily  . metoprolol tartrate  25 mg Oral BID  . montelukast  10 mg Oral QHS  . moxifloxacin  400 mg Oral Q24H  . pantoprazole  40 mg Oral Daily  . predniSONE  60 mg Oral Q breakfast  . rivaroxaban  20 mg Oral Q breakfast  . sodium chloride  2 spray Each Nare BID    OBJECTIVE  Filed Vitals:   10/11/15 2141 10/12/15 0509 10/12/15 0651 10/12/15 0759  BP: 136/61 189/72 171/74   Pulse: 67 70    Temp: 98.5 F (36.9 C) 98.9 F (37.2 C)    TempSrc: Oral Oral    Resp: 18 18    Height:      Weight:  229 lb 0.9 oz (103.9 kg)    SpO2: 97% 93%  90%    Intake/Output Summary (Last 24 hours) at 10/12/15 0937 Last data filed at 10/12/15 0700  Gross per 24 hour  Intake   1590 ml  Output   1375 ml  Net    215 ml   Filed Weights   10/11/15 0416 10/11/15 1218 10/12/15 0509  Weight: 217 lb 9.5 oz (98.7 kg) 223 lb 8.7 oz (101.4 kg) 229 lb 0.9 oz (103.9 kg)    PHYSICAL EXAM  General: Pleasant, NAD. Neuro: Alert and oriented X 3. Moves all extremities spontaneously. Psych: Normal  affect. HEENT:  Normal  Neck: Supple without bruits or JVD. Lungs:  Resp regular and unlabored, CTA with very mild expiratory wheezing Heart: RRR no s3, s4, or murmurs. Abdomen: Soft, non-tender, non-distended, BS + x 4.  Extremities: No clubbing, cyanosis or edema. DP/PT/Radials 2+ and equal bilaterally.  Accessory Clinical Findings  CBC  Recent Labs  10/09/15 1622  10/10/15 1811 10/11/15 1126 10/12/15 0433  WBC 19.9*  --  21.2* 23.7* 18.5*  NEUTROABS 17.5*  --  19.3*  --   --   HGB 11.0*  < > 10.5* 10.9* 10.7*  HCT 33.3*  < > 31.2* 32.4* 32.2*  MCV 92.0  --  90.2 90.3 89.7  PLT 234  --  231 245 249  < > = values in this interval not displayed. Basic Metabolic Panel  Recent Labs  10/11/15 0310 10/12/15 0433  NA 139 140  K 4.3 4.1  CL 112* 114*  CO2 22 21*  GLUCOSE 150* 107*  BUN 19 21*  CREATININE 0.88 1.05*  CALCIUM 8.4* 8.3*  MG 2.7* 2.2  PHOS 2.1* 1.7*   Liver Function Tests  Recent Labs  10/09/15 1622 10/11/15 0310 10/12/15 0433  AST 36  --   --   ALT 25  --   --   ALKPHOS 78  --   --   BILITOT 0.8  --   --   PROT 6.2*  --   --   ALBUMIN 3.3* 3.0* 2.9*    Recent Labs  10/09/15 1622  LIPASE 28   Cardiac Enzymes  Recent Labs  10/10/15 1055 10/10/15 1811 10/10/15 2251  TROPONINI 0.64* 0.49* 0.50*   Fasting Lipid Panel  Recent Labs  10/12/15 0433  CHOL 162  HDL 51  LDLCALC 84  TRIG 136  CHOLHDL 3.2    TELE NSR without sign of aifb    ECG  NSR with diffuse ST depression  Echocardiogram 10/10/2015  LV EF: 65% - 70%  ------------------------------------------------------------------- History: PMH: Elevated Troponin. Atrial fibrillation. Risk factors: Hypertension. Diabetes mellitus. Dyslipidemia.  ------------------------------------------------------------------- Study Conclusions  - Left ventricle: The cavity size was normal. Systolic function was  vigorous. The estimated ejection fraction was in the  range of 65%  to 70%. Wall motion was normal; there were no regional wall  motion abnormalities. - Aortic valve: Not well seen unable to tell if tri leaflet - Atrial septum: No defect or patent foramen ovale was identified. - Impressions: Small hyperdynamic LV with small LVOT No SAM of  anterior mitral leaflet but does appear to be subchordal  apposition with septum and possible small LVOT gradient. Consider  TEE to further evaluate or hydrate and Rx  beta blocker and repeat TTE.  Impressions:  - Small hyperdynamic LV with small LVOT No SAM of anterior mitral  leaflet but does appear to be subchordal  apposition with septum and possible small LVOT gradient. Consider  TEE to further evaluate or hydrate and Rx  beta blocker and repeat TTE.    Radiology/Studies  Ct Head Wo Contrast  10/09/2015  CLINICAL DATA:  Nonresponsive, respiratory distress. EXAM: CT HEAD WITHOUT CONTRAST TECHNIQUE: Contiguous axial images were obtained from the base of the skull through the vertex without intravenous contrast. COMPARISON:  None. FINDINGS: Mild right maxillary sinusitis is noted. Minimal chronic ischemic white matter disease is noted. No mass effect or midline shift is noted. Ventricular size is within normal limits. There is no evidence of mass lesion, hemorrhage or acute infarction. IMPRESSION: Mild right maxillary sinusitis. Minimal chronic ischemic white matter disease. No acute intracranial abnormality seen. Electronically Signed   By: Lupita Raider, M.D.   On: 10/09/2015 16:14   Dg Chest Portable 1 View  10/09/2015  CLINICAL DATA:  Respiratory distress. EXAM: PORTABLE CHEST 1 VIEW COMPARISON:  Radiograph of March 30, 2015. FINDINGS: The heart size and mediastinal contours are within normal limits. No pneumothorax or pleural effusion is noted. Endotracheal tube is seen projected over tracheal air shadow with distal tip 2.5 cm above the carina. Both lungs are clear. The visualized  skeletal structures are unremarkable. IMPRESSION: Endotracheal tube in grossly good position. No acute cardiopulmonary abnormality seen. Electronically Signed   By: Lupita Raider, M.D.   On: 10/09/2015 15:48   Dg Abd Portable 1v  10/09/2015  CLINICAL DATA:  Orogastric tube placement. EXAM: PORTABLE ABDOMEN - 1 VIEW COMPARISON:  None. FINDINGS: Enteric catheter descends from the thorax to the central abdomen in a straight line. Lack of gastric bubble, and somewhat unusual course of the catheter make confirmation of positioning difficult. The bowel gas pattern is nonobstructive. No radio-opaque calculi or other significant radiographic abnormality are seen. IMPRESSION: Enteric catheter descends from the  thorax to the central abdomen in a straight line. Although it may be within the gastric body, radiographic confirmation is difficult due to its somewhat unusual course and lack of gastric bubble. Electronically Signed   By: Ted Mcalpine M.D.   On: 10/09/2015 22:28    ASSESSMENT AND PLAN  1. Elevated troponin  - Likely due to demand ischemic in setting of acute medical illness. Not too suprising given her severe hypoxia requiring intubation.  - no clear trend, making demand ischemia most likely.  - continue treat breathing issue. Her asthma appears to have largely resolved, but would not risk with lexiscan myoview at this time given recent intubation. Will consider as outpatient once she is clinically improved on followup. She says she cannot walk on treadmill. Followup arranged.   2. afib - currently in NSR  - CHA2DS2-Vasc score 4 (HTN, female, age)  - Resume xarelto  daily at this time (CrCl 80)  3. HTN Stable No change required today  4. Overweight Weight loss advised Body mass index is 32.79 kg/(m^2).   5. Hyperdynamic LV  - Echo shows EF 65-70%, small LVOT. No SAM of anterior mitral leaflet but does appear to be subchordal apposition with septum and possible small LVOT  gradient. Consider TEE to further evaluate or hydrate and Rx beta blocker and repeat TTE.  - Would not advise TEE at this time  - Would repeat echo elective once clinically improved as outpatient  Signed, Azalee Course PA-C Pager: [phone]  Personally seen and examined. Agree with above. Feeling better. Minimal wheeze.  Agree with considering outpt. NUC stress.  No significant murmur on exam. No need to repeat ECHO at this time. No TEE needed.  NSR currently. Continue Xarelto.  Will sign off. Please call if any questions.   Donato Schultz, MD

## 2015-10-12 NOTE — Progress Notes (Signed)
PULMONARY / CRITICAL CARE MEDICINE   Name: [name] [name] MRN: [id number] DOB: 11/12/1940    ADMISSION DATE:  10/09/2015 CONSULTATION DATE:  10/09/2015  REFERRING MD:  ED Dr. Vanetta Mulders  CHIEF COMPLAINT:  Severe Asthma Exacerbation  HISTORY OF PRESENT ILLNESS:   Pt known to have chronic asthma with fixed airway obstruction (FEV1 1.61 69%), on Symbicort, Spiriva, nebulizer medicines. She has a lot of allergy to antibiotics and according to the son, she was tested and was found to be tolerant to [facility], presented to the emergency room with unresponsiveness. She saw Dr. Delton Coombes in 2015 and her asthma has been stable until the last couple of days. She saw her primary care doctor yesterday and was prescribed prednisone for acute flareup of asthma. According to the son, patient was not too bad as partial breathing is concerned. It is not sure what happened noon time but I think patient overexerted herself and became severely short of breath. She was seen to be in respiratory distress and unresponsive. She was brought to the emergency room. He was intubated for unresponsiveness and respiratory distress. ABG postintubation showed pH of 7.23, PCO2 61, PO2 of 101 and that's on 40%. Blood pressure is on the soft side with propofol. Propofol has been discontinued. She remains sedated on and off. She is currently on her second and third liters of IV fluids. Still tight with wheezing. She woke up a little bit and she said she just felt short of breath. Denied any chest pain.   SUBJECTIVE:  No distress.   VITAL SIGNS: BP 163/79 mmHg  Pulse 72  Temp(Src) 98.9 F (37.2 C) (Oral)  Resp 18  Ht  (1.727 m)  Wt 229 lb 0.9 oz (103.9 kg)  BMI 34.84 kg/m2  SpO2 90%  LMP 12/01/2010 Room air  HEMODYNAMICS:    VENTILATOR SETTINGS:    INTAKE / OUTPUT: I/O last 3 completed shifts: In: 2121.1 [P.O.:240; I.V.:1881.1] Out: 1825 [Urine:1825]  PHYSICAL EXAMINATION: General:  Awake. No acute  distress. Comfortable. No family at bedside. Neuro: Oriented to person, year, president, and hospital location. Moving all 4 extremities equally. Grossly nonfocal. HEENT:  Moist mucous membranes. No scleral icterus. No oral ulcers. Cardiovascular:  No appreciable JVD. Regular rate. No edema. Lungs: Prolonged exhalation phase but improving aeration bilaterally. Clear to auscultation bilaterally. No accessory use  Abdomen:  Soft. Nontender. Mildly protuberant. Normal bowel sounds. Integument: Warm and dry. No rash on exposed skin.  LABS:  BMET  Recent Labs Lab 10/10/15 0344 10/11/15 0310 10/12/15 0433  NA 139 139 140  K 2.9* 4.3 4.1  CL 110 112* 114*  CO2 19* 22 21*  BUN 20 19 21*  CREATININE 1.01* 0.88 1.05*  GLUCOSE 227* 150* 107*    Electrolytes  Recent Labs Lab 10/10/15 0344 10/11/15 0310 10/12/15 0433  CALCIUM 7.7* 8.4* 8.3*  MG 1.6* 2.7* 2.2  PHOS 2.5 2.1* 1.7*    CBC  Recent Labs Lab 10/10/15 1811 10/11/15 1126 10/12/15 0433  WBC 21.2* 23.7* 18.5*  HGB 10.5* 10.9* 10.7*  HCT 31.2* 32.4* 32.2*  PLT 231 245 249    Coag's  Recent Labs Lab 10/09/15 1622 10/09/15 1900 10/10/15 0344  APTT  --  20* 65*  INR 1.23  --   --     Sepsis Markers  Recent Labs Lab 10/09/15 1600 10/09/15 2120 10/10/15 0344 10/10/15 1029 10/11/15 0310  LATICACIDVEN  --  4.4*  --  1.3  --   PROCALCITON <0.10  --  2.04  --  1.55    ABG  Recent Labs Lab 10/09/15 1615 10/09/15 1922 10/10/15 0330  PHART 7.236* 7.379 7.408  PCO2ART 60.6* 39.5 31.6*  PO2ART 101* 83.1 93.7    Liver Enzymes  Recent Labs Lab 10/09/15 1622 10/11/15 0310 10/12/15 0433  AST 36  --   --   ALT 25  --   --   ALKPHOS 78  --   --   BILITOT 0.8  --   --   ALBUMIN 3.3* 3.0* 2.9*    Cardiac Enzymes  Recent Labs Lab 10/10/15 1055 10/10/15 1811 10/10/15 2251  TROPONINI 0.64* 0.49* 0.50*    Glucose  Recent Labs Lab 10/11/15 0415 10/11/15 0734 10/11/15 1127  10/11/15 1657 10/11/15 2209 10/12/15 0813  GLUCAP 175* 134* 131* 157* 122* 111*    Imaging No results found.  STUDIES:  PFT 07/22/13:  FVC 2.52 L (82%) FEV1 1.61 L (69%) FEV1/FVC 0.64 positive bronchodilator response DLCO uncorrected 79% CT HEAD W/O 6/16: Right maxillary sinusitis. Minimal chronic ischemic white matter disease. No acute intracranial abnormality.  KUB 6/16:  Enteric feeding tube coursing below diaphragm but unable to visualize further. PORT CXR 6/16: Endotracheal tube in good position. Unable to visualize lower lung zones completely. TTE 6/17: EF 65-70%. no regional wall motion abnormalities. Small hyperdynamic LV with small LVOT. RV normal in size and function.   MICROBIOLOGY: MRSA PCR 6/16:  Negative  Blood Ctx x2 6/16 >> Coag Neg Staph 1/2 bottles Urine Ctx 6/17>> Urine Strep Ag 6/17:  Negative Urine Legionella Ag 6/17>>  ANTIBIOTICS: Avelox 6/17>>  SIGNIFICANT EVENTS: 6/16 - Admit for severe asthma exacerbation. Intubated.   LINES/TUBES: OETT 7.5 6/16 - 6/17 R RAD ART LINE 6/17 - 6/17 OGT 6/16 - 6/17 FOLEY 6/16>> PIV x2  ASSESSMENT / PLAN:  Acute Hypoxic & Hypercarbic Respiratory Failure - Improving. Severe Asthma Exacerbation - Improving Acute Bronchitis Right Maxillary Sinusitis - Holding Flonase. P:   Prednisone 40mg  daily; taper over 2 weeks Switch Duoneb to q6hr while awake Continuing Claritin & Singulair Incentive Spirometer for Pulmonary Toilette Avelox Day #3 Awaiting Tracheal Aspirate Ctx Trending PCT according to algorithm Awaiting Legionella Antigen  Hypotension - Resolved with IVF bolus. Propofol causes hypotension. Elevated Troponin I - Suspect demand ischemia. H/O Diastolic CHF H/O Atrial fibrillation H/O Hyperlipidemia H/O HTN P:  Appreciate Cardiology recommendations & consultation Monitoring on telemetry Restarted Xarelto 6/18 Plan for outpatient stress test Restarting home Lopressor bid Holding home  Torsemide  ECOLI UTI P: Repeat UC  H/O GERD P:   Heart Healthy Diet Protonix PO daily    Leukocytosis - Likely secondary to stress response & acute infection. Anemia - Mild.  Chronic Anticoagulation - Xarelto as outpatient. P:  Trending cell counts daily w/ CBC SCDs Restarted systemic anticoagulation with Xarelto   H/O DM Type II - BG controlled. H/O Hypothyroidism P:   Synthroid 50mcg qAM Hgb A1c Pending SSI per Low Dose Algorithm Switching Accu-Checks to qAC & HS Holding home Metformin    FAMILY  - Updates:  Son updated by Dr. Jamison NeighborNestor at length 6/17.   - Inter-disciplinary family meet or Palliative Care meeting due by:  10/16/15  TODAY'S SUMMARY:   75 year old female with history of chronic asthma and fixed airway obstruction. Presenting with severe asthma exacerbation. Patient seems to be recovering well from her asthma exacerbation. We are tapering her to prednisone daily. Will resume her home BDs, and cont current abx. Will re-culture urine given UTI.  10/12/2015, 11:53 AM  Erick Colace ACNP-BC [place] Pager # [phone] OR # [phone] if no answer

## 2015-10-12 NOTE — Plan of Care (Signed)
Problem: Activity: Goal: Risk for activity intolerance will decrease Outcome: Completed/Met Date Met:  10/12/15 Patient was provided education on safety and precautions, pt is out of bed to chair with 1-2 assist. Slow to respond

## 2015-10-12 NOTE — Clinical Social Work Placement (Signed)
   CLINICAL SOCIAL WORK PLACEMENT  NOTE  Date:  10/12/2015  Patient Details  Name: [name] [name] MRN: [id number] Date of Birth: Dec 23, 1940  Clinical Social Work is seeking post-discharge placement for this patient at the Skilled  Nursing Facility level of care (*CSW will initial, date and re-position this form in  chart as items are completed):  Yes   Patient/family provided with [place] Clinical Social Work Department's list of facilities offering this level of care within the geographic area requested by the patient (or if unable, by the patient's family).  Yes   Patient/family informed of their freedom to choose among providers that offer the needed level of care, that participate in Medicare, Medicaid or managed care program needed by the patient, have an available bed and are willing to accept the patient.  Yes   Patient/family informed of [facility]'s ownership interest in [facility] and [facility], as well as of the fact that they are under no obligation to receive care at these facilities.  PASRR submitted to EDS on 10/12/15     PASRR number received on 10/12/15     Existing PASRR number confirmed on       FL2 transmitted to all facilities in geographic area requested by pt/family on 10/12/15     FL2 transmitted to all facilities within larger geographic area on       Patient informed that his/her managed care company has contracts with or will negotiate with certain facilities, including the following:            Patient/family informed of bed offers received.  Patient chooses bed at       Physician recommends and patient chooses bed at      Patient to be transferred to   on  .  Patient to be transferred to facility by       Patient family notified on   of transfer.  Name of family member notified:        PHYSICIAN       Additional Comment:    _______________________________________________ [name], Othar Curto F, LCSW 10/12/2015, 3:49  PM

## 2015-10-13 LAB — CBC
HEMATOCRIT: 34.2 % — AB (ref 36.0–46.0)
HEMOGLOBIN: 11.3 g/dL — AB (ref 12.0–15.0)
MCH: 29.6 pg (ref 26.0–34.0)
MCHC: 33 g/dL (ref 30.0–36.0)
MCV: 89.5 fL (ref 78.0–100.0)
Platelets: 255 10*3/uL (ref 150–400)
RBC: 3.82 MIL/uL — ABNORMAL LOW (ref 3.87–5.11)
RDW: 15.6 % — ABNORMAL HIGH (ref 11.5–15.5)
WBC: 11.8 10*3/uL — ABNORMAL HIGH (ref 4.0–10.5)

## 2015-10-13 LAB — BASIC METABOLIC PANEL
Anion gap: 6 (ref 5–15)
BUN: 16 mg/dL (ref 6–20)
CHLORIDE: 113 mmol/L — AB (ref 101–111)
CO2: 22 mmol/L (ref 22–32)
CREATININE: 0.91 mg/dL (ref 0.44–1.00)
Calcium: 8.3 mg/dL — ABNORMAL LOW (ref 8.9–10.3)
GFR calc Af Amer: >60 mL/min (ref >60)
GFR calc non Af Amer: 59 mL/min — ABNORMAL LOW (ref >60)
GLUCOSE: 110 mg/dL — AB (ref 65–99)
Potassium: 4 mmol/L (ref 3.5–5.1)
Sodium: 141 mmol/L (ref 135–145)

## 2015-10-13 LAB — GLUCOSE, CAPILLARY
GLUCOSE-CAPILLARY: 122 mg/dL — AB (ref 65–99)
Glucose-Capillary: 134 mg/dL — ABNORMAL HIGH (ref 65–99)
Glucose-Capillary: 117 mg/dL — ABNORMAL HIGH (ref 65–99)

## 2015-10-13 LAB — PHOSPHORUS: Phosphorus: 3.4 mg/dL (ref 2.5–4.6)

## 2015-10-13 MED ORDER — IRBESARTAN 150 MG PO TABS
75.0000 mg | ORAL_TABLET | Freq: Every day | ORAL | Status: DC
  Administered 2015-10-13 – 2015-10-14 (×2): 75 mg via ORAL
  Filled 2015-10-13 (×2): qty 1

## 2015-10-13 MED ORDER — TORSEMIDE 20 MG PO TABS: 20.0000 mg | ORAL_TABLET | Freq: Two times a day (BID) | ORAL | Status: DC

## 2015-10-13 MED ORDER — TORSEMIDE 10 MG PO TABS
10.0000 mg | ORAL_TABLET | Freq: Two times a day (BID) | ORAL | Status: DC
  Administered 2015-10-13 – 2015-10-14 (×2): 10 mg via ORAL
  Filled 2015-10-13 (×4): qty 1

## 2015-10-13 NOTE — Clinical Social Work Placement (Signed)
CSW provided SNF bed offers to patient's son, [name] [name] - patient's son plans to [facility] and will follow-up with CSW once he has made a decision.    Lincoln MaxinKelly Tammye Kahler, LCSW [facility] Clinical Social Worker cell #: [phone]     CLINICAL SOCIAL WORK PLACEMENT  NOTE  Date:  10/13/2015  Patient Details  Name: [name] [name] MRN: [id number] Date of Birth: [birth year]  Clinical Social Work is seeking post-discharge placement for this patient at the Skilled  Nursing Facility level of care (*CSW will initial, date and re-position this form in  chart as items are completed):  Yes   Patient/family provided with [facility] Clinical Social Work Department's list of facilities offering this level of care within the geographic area requested by the patient (or if unable, by the patient's family).  Yes   Patient/family informed of their freedom to choose among providers that offer the needed level of care, that participate in Medicare, Medicaid or managed care program needed by the patient, have an available bed and are willing to accept the patient.  Yes   Patient/family informed of [facility]'s ownership interest in [facility] and [facility], as well as of the fact that they are under no obligation to receive care at these facilities.  PASRR submitted to EDS on 10/12/15     PASRR number received on 10/12/15     Existing PASRR number confirmed on       FL2 transmitted to all facilities in geographic area requested by pt/family on 10/12/15     FL2 transmitted to all facilities within larger geographic area on       Patient informed that his/her managed care company has contracts with or will negotiate with certain facilities, including the following:        Yes   Patient/family informed of bed offers received.  Patient chooses bed at       Physician recommends and patient chooses bed at      Patient to be transferred to   on   .  Patient to be transferred to facility by       Patient family notified on   of transfer.  Name of family member notified:        PHYSICIAN       Additional Comment:    _______________________________________________ [name], Frimet Durfee F, LCSW 10/13/2015, 3:58 PM

## 2015-10-13 NOTE — Care Management Important Message (Signed)
Important Message  Patient Details  Name: [name] [name] MRN: [id number] Date of Birth: [birth year]   Medicare Important Message Given:  Yes    Haskell FlirtJamison, Loann Chahal 10/13/2015, 9:26 AMImportant Message  Patient Details  Name: [name] [name] MRN: [id number] Date of Birth: [birth year]   Medicare Important Message Given:  Yes    Haskell FlirtJamison, Elon Eoff 10/13/2015, 9:26 AM

## 2015-10-13 NOTE — Progress Notes (Signed)
PROGRESS NOTE  [name] PolingMargaret M [name]  EAV:[id number] DOB: 1941-02-07 DOA: 10/09/2015 PCP: Sanda Lingerhomas Jones, MD    Brief Narrative:  75 yo F with Hx COPD, asthma, PAF on Xarelto, DM, HTN, hypothyroidism and chronic diastolic CHF who presented with 2 weeks worsening dyspnea and cough, as well as confusion.  At the time she was admitted she was obtunded and hypoxic, and had to be intubated for a COPD/asthma flare.  She was abel to be extubated the following day.    Assessment & Plan:   Acute Hypoxic & Hypercarbic Respiratory Failure due to a Severe Asthma Exacerbation prednisone to be weaned to off over 7 days - stop abx - cont current nebs - appears stable at this time - evaluate sats w/ ambulation   Hypophosphatemia Corrected   AKI - resolved Resolved w/ volume resuscitation   Coag Neg Staph bacteremia v/s contaminant 1/2 bottles, probably a contaminant - no clinical evidence of ongoing infection - if fevers recur, will have to consider eval for occult source/SBE  Asymptomatic E coli bacteruria Has completed 3 days of abx tx - stop abx and follow   Lactic/Metabolic Acidosis Resolved  HTN BP poorly controlled - adjust tx and follow trend  PAF - Chronic Anticoagulation Xarelto as outpatient - rate controlled   Elevated troponin Peak 0.86 - Cards consulted - TTE unrevealing - to have an outpatient stress test per Cards  Chronic diastolic CHF EF 65% this admit with no wall motion abnormalities - no clinical evidence of volume overload  Filed Weights   10/11/15 1218 10/12/15 0509 10/13/15 0506  Weight: 101.4 kg (223 lb 8.7 oz) 103.9 kg (229 lb 0.9 oz) 102.921 kg (226 lb 14.4 oz)   DM -CBG reasonably controlled  Hypothyroidism -Levothyroxine to continue   Severely deconditioned  For SNF placement for short term rehab stay - discussed w/ pt and she agrees   DVT prophylaxis: xarelto Code Status: FULL Family Communication: No family present at time of exam Disposition  Plan: Ultimate disposition is discharge to SNF for rehabilitation stay - will likely be medically cleared for same within 24 hours  Consultants:  Cardiology  PCCM  Antimicrobials: Avelox 6/16 > 6/19  Subjective: Denies chest pain fevers chills nausea or vomiting.  Admits that she is getting tired of being hearing getting grumpy.  Admits to feeling very weak when she attempts to walk and quickly becoming short of breath with the slightest bit of exertion.  Objective: Filed Vitals:   10/12/15 1415 10/12/15 2225 10/13/15 0506 10/13/15 0931  BP: 153/80 147/57 164/70   Pulse: 59 61 63   Temp: 98.1 F (36.7 C) 98.4 F (36.9 C) 98 F (36.7 C)   TempSrc: Oral Oral Oral   Resp: 24 24 24    Height:      Weight:   102.921 kg (226 lb 14.4 oz)   SpO2: 93% 98% 91% 94%    Intake/Output Summary (Last 24 hours) at 10/13/15 0957 Last data filed at 10/13/15 0823  Gross per 24 hour  Intake 1717.5 ml  Output      0 ml  Net 1717.5 ml   Filed Weights   10/11/15 1218 10/12/15 0509 10/13/15 0506  Weight: 101.4 kg (223 lb 8.7 oz) 103.9 kg (229 lb 0.9 oz) 102.921 kg (226 lb 14.4 oz)    Examination: General: No acute respiratory distress at rest in bed Lungs: Clear to auscultation bilaterally without wheezes or crackles Cardiovascular: Regular rate and rhythm without murmur gallop or rub normal S1  and S2 Abdomen: Nontender, nondistended, soft, bowel sounds positive, no rebound, no ascites, no appreciable mass Extremities: No significant cyanosis, or clubbing - trace edema bilateral lower extremities   Data Reviewed:  CBC:  Recent Labs Lab 10/09/15 1622 10/09/15 1650 10/10/15 1811 10/11/15 1126 10/12/15 0433 10/13/15 0506  WBC 19.9*  --  21.2* 23.7* 18.5* 11.8*  NEUTROABS 17.5*  --  19.3*  --   --   --   HGB 11.0* 11.2* 10.5* 10.9* 10.7* 11.3*  HCT 33.3* 33.0* 31.2* 32.4* 32.2* 34.2*  MCV 92.0  --  90.2 90.3 89.7 89.5  PLT 234  --  231 245 249 255   Basic Metabolic  Panel:  Recent Labs Lab 10/09/15 1600  10/09/15 1622 10/09/15 1650 10/10/15 0344 10/11/15 0310 10/12/15 0433 10/13/15 0506  NA  --   < > 137 139 139 139 140 141  K  --   < > 3.3* 3.5 2.9* 4.3 4.1 4.0  CL  --   < > 100* 99* 110 112* 114* 113*  CO2  --   --  28  --  19* 22 21* 22  GLUCOSE  --   < > 324* 309* 227* 150* 107* 110*  BUN  --   < > 23* 24* 20 19 21* 16  CREATININE  --   < > 1.33* 1.20* 1.01* 0.88 1.05* 0.91  CALCIUM  --   --  8.2*  --  7.7* 8.4* 8.3* 8.3*  MG 2.1  --   --   --  1.6* 2.7* 2.2  --   PHOS 6.6*  --   --   --  2.5 2.1* 1.7* 3.4  < > = values in this interval not displayed. GFR: Estimated Creatinine Clearance: 65 mL/min (by C-G formula based on Cr of 0.91).   Liver Function Tests:  Recent Labs Lab 10/09/15 1622 10/11/15 0310 10/12/15 0433  AST 36  --   --   ALT 25  --   --   ALKPHOS 78  --   --   BILITOT 0.8  --   --   PROT 6.2*  --   --   ALBUMIN 3.3* 3.0* 2.9*    Recent Labs Lab 10/09/15 1622  LIPASE 28   Coagulation Profile:  Recent Labs Lab 10/09/15 1622  INR 1.23   Cardiac Enzymes:  Recent Labs Lab 10/10/15 0025 10/10/15 0344 10/10/15 1055 10/10/15 1811 10/10/15 2251  TROPONINI 0.53* 0.86* 0.64* 0.49* 0.50*   HbA1C:  Recent Labs  10/10/15 1037  HGBA1C 5.9*   CBG:  Recent Labs Lab 10/12/15 0813 10/12/15 1237 10/12/15 1659 10/12/15 2219 10/13/15 0816  GLUCAP 111* 163* 158* 137* 134*   Lipid Profile:  Recent Labs  10/12/15 0433  CHOL 162  HDL 51  LDLCALC 84  TRIG 136  CHOLHDL 3.2   Sepsis Labs:  Recent Labs Lab 10/09/15 1600 10/09/15 2120 10/10/15 0344 10/10/15 1029 10/11/15 0310  PROCALCITON <0.10  --  2.04  --  1.55  LATICACIDVEN  --  4.4*  --  1.3  --     Recent Results (from the past 240 hour(s))  Culture, blood (routine x 2)     Status: Abnormal   Collection Time: 10/09/15  3:32 PM  Result Value Ref Range Status   Specimen Description BLOOD LEFT ANTECUBITAL  Final   Special  Requests BOTTLES DRAWN AEROBIC ONLY 5CC  Final   Culture  Setup Time   Final    GRAM POSITIVE COCCI  IN CLUSTERS AEROBIC BOTTLE ONLY CRITICAL RESULT CALLED TO, READ BACK BY AND VERIFIED WITH: D ZEIGLER,PHARMD AT 1709 10/10/15 BY L BENFIELD    Culture (A)  Final    STAPHYLOCOCCUS SPECIES (COAGULASE NEGATIVE) THE SIGNIFICANCE OF ISOLATING THIS ORGANISM FROM A SINGLE SET OF BLOOD CULTURES WHEN MULTIPLE SETS ARE DRAWN IS UNCERTAIN. PLEASE NOTIFY THE MICROBIOLOGY DEPARTMENT WITHIN ONE WEEK IF SPECIATION AND SENSITIVITIES ARE REQUIRED. Performed at [facility]    Report Status 10/12/2015 FINAL  Final  Blood Culture ID Panel (Reflexed)     Status: Abnormal   Collection Time: 10/09/15  3:32 PM  Result Value Ref Range Status   Enterococcus species NOT DETECTED NOT DETECTED Final   Vancomycin resistance NOT DETECTED NOT DETECTED Final   Listeria monocytogenes NOT DETECTED NOT DETECTED Final   Staphylococcus species DETECTED (A) NOT DETECTED Final    Comment: CRITICAL RESULT CALLED TO, READ BACK BY AND VERIFIED WITH: D ZEIGLER,PHARMD AT 1709 10/10/15 BY L BENFIELD    Staphylococcus aureus NOT DETECTED NOT DETECTED Final   Methicillin resistance NOT DETECTED NOT DETECTED Final   Streptococcus species NOT DETECTED NOT DETECTED Final   Streptococcus agalactiae NOT DETECTED NOT DETECTED Final   Streptococcus pneumoniae NOT DETECTED NOT DETECTED Final   Streptococcus pyogenes NOT DETECTED NOT DETECTED Final   Acinetobacter baumannii NOT DETECTED NOT DETECTED Final   Enterobacteriaceae species NOT DETECTED NOT DETECTED Final   Enterobacter cloacae complex NOT DETECTED NOT DETECTED Final   Escherichia coli NOT DETECTED NOT DETECTED Final   Klebsiella oxytoca NOT DETECTED NOT DETECTED Final   Klebsiella pneumoniae NOT DETECTED NOT DETECTED Final   Proteus species NOT DETECTED NOT DETECTED Final   Serratia marcescens NOT DETECTED NOT DETECTED Final   Carbapenem resistance NOT DETECTED NOT  DETECTED Final   Haemophilus influenzae NOT DETECTED NOT DETECTED Final   Neisseria meningitidis NOT DETECTED NOT DETECTED Final   Pseudomonas aeruginosa NOT DETECTED NOT DETECTED Final   Candida albicans NOT DETECTED NOT DETECTED Final   Candida glabrata NOT DETECTED NOT DETECTED Final   Candida krusei NOT DETECTED NOT DETECTED Final   Candida parapsilosis NOT DETECTED NOT DETECTED Final   Candida tropicalis NOT DETECTED NOT DETECTED Final    Comment: Performed at [facility]  Culture, blood (routine x 2)     Status: None (Preliminary result)   Collection Time: 10/09/15  4:40 PM  Result Value Ref Range Status   Specimen Description BLOOD LEFT FOREARM  Final   Special Requests IN PEDIATRIC BOTTLE 3CC  Final   Culture   Final    NO GROWTH 3 DAYS Performed at [facility]    Report Status PENDING  Incomplete  MRSA PCR Screening     Status: None   Collection Time: 10/09/15  8:21 PM  Result Value Ref Range Status   MRSA by PCR NEGATIVE NEGATIVE Final    Comment:        The GeneXpert MRSA Assay (FDA approved for NASAL specimens only), is one component of a comprehensive MRSA colonization surveillance program. It is not intended to diagnose MRSA infection nor to guide or monitor treatment for MRSA infections.   Urine culture     Status: Abnormal   Collection Time: 10/10/15  1:16 PM  Result Value Ref Range Status   Specimen Description URINE, CATHETERIZED  Final   Special Requests Normal  Final   Culture >=100,000 COLONIES/mL ESCHERICHIA COLI (A)  Final   Report Status 10/12/2015 FINAL  Final  Organism ID, Bacteria ESCHERICHIA COLI (A)  Final      Susceptibility   Escherichia coli - MIC*    AMPICILLIN <=2 SENSITIVE Sensitive     CEFAZOLIN <=4 SENSITIVE Sensitive     CEFTRIAXONE <=1 SENSITIVE Sensitive     CIPROFLOXACIN <=0.25 SENSITIVE Sensitive     GENTAMICIN <=1 SENSITIVE Sensitive     IMIPENEM <=0.25 SENSITIVE Sensitive     NITROFURANTOIN <=16 SENSITIVE  Sensitive     TRIMETH/SULFA <=20 SENSITIVE Sensitive     AMPICILLIN/SULBACTAM <=2 SENSITIVE Sensitive     PIP/TAZO <=4 SENSITIVE Sensitive     * >=100,000 COLONIES/mL ESCHERICHIA COLI     Scheduled Meds: . dextromethorphan-guaiFENesin  1 tablet Oral BID  . insulin aspart  0-5 Units Subcutaneous QHS  . insulin aspart  0-9 Units Subcutaneous TID WC  . levothyroxine  50 mcg Oral QAC breakfast  . loratadine  10 mg Oral Daily  . metoprolol tartrate  25 mg Oral BID  . mometasone-formoterol  2 puff Inhalation BID  . montelukast  10 mg Oral QHS  . moxifloxacin  400 mg Oral Q24H  . pantoprazole  40 mg Oral Daily  . predniSONE  40 mg Oral Q breakfast  . rivaroxaban  20 mg Oral Q breakfast  . sodium chloride  2 spray Each Nare BID  . tiotropium  18 mcg Inhalation Daily     LOS: 4 days   Time spent: 35 minutes.   Lonia Blood, MD [facility] Office  [phone] Pager - Text Page per Amion as per below:  On-Call/Text Page:      [email or web address]      password [email or web address]  If 7PM-7AM, please contact night-coverage [email or web address] Password [facility] 10/13/2015, 10:22 AM

## 2015-10-14 DIAGNOSIS — E441 Mild protein-calorie malnutrition: Secondary | ICD-10-CM | POA: Diagnosis not present

## 2015-10-14 DIAGNOSIS — J9601 Acute respiratory failure with hypoxia: Secondary | ICD-10-CM | POA: Diagnosis not present

## 2015-10-14 DIAGNOSIS — E784 Other hyperlipidemia: Secondary | ICD-10-CM | POA: Diagnosis not present

## 2015-10-14 DIAGNOSIS — J9600 Acute respiratory failure, unspecified whether with hypoxia or hypercapnia: Secondary | ICD-10-CM | POA: Diagnosis not present

## 2015-10-14 DIAGNOSIS — I480 Paroxysmal atrial fibrillation: Secondary | ICD-10-CM | POA: Diagnosis not present

## 2015-10-14 DIAGNOSIS — R278 Other lack of coordination: Secondary | ICD-10-CM | POA: Diagnosis not present

## 2015-10-14 DIAGNOSIS — D649 Anemia, unspecified: Secondary | ICD-10-CM | POA: Diagnosis not present

## 2015-10-14 DIAGNOSIS — I248 Other forms of acute ischemic heart disease: Secondary | ICD-10-CM | POA: Diagnosis not present

## 2015-10-14 DIAGNOSIS — J45901 Unspecified asthma with (acute) exacerbation: Secondary | ICD-10-CM | POA: Diagnosis not present

## 2015-10-14 DIAGNOSIS — J449 Chronic obstructive pulmonary disease, unspecified: Secondary | ICD-10-CM | POA: Diagnosis not present

## 2015-10-14 DIAGNOSIS — R8271 Bacteriuria: Secondary | ICD-10-CM | POA: Diagnosis not present

## 2015-10-14 DIAGNOSIS — I10 Essential (primary) hypertension: Secondary | ICD-10-CM | POA: Diagnosis not present

## 2015-10-14 DIAGNOSIS — J4541 Moderate persistent asthma with (acute) exacerbation: Secondary | ICD-10-CM | POA: Diagnosis not present

## 2015-10-14 DIAGNOSIS — J9620 Acute and chronic respiratory failure, unspecified whether with hypoxia or hypercapnia: Secondary | ICD-10-CM | POA: Diagnosis not present

## 2015-10-14 DIAGNOSIS — I5032 Chronic diastolic (congestive) heart failure: Secondary | ICD-10-CM | POA: Diagnosis not present

## 2015-10-14 DIAGNOSIS — R7989 Other specified abnormal findings of blood chemistry: Secondary | ICD-10-CM | POA: Diagnosis not present

## 2015-10-14 DIAGNOSIS — E039 Hypothyroidism, unspecified: Secondary | ICD-10-CM | POA: Diagnosis not present

## 2015-10-14 DIAGNOSIS — R2689 Other abnormalities of gait and mobility: Secondary | ICD-10-CM | POA: Diagnosis not present

## 2015-10-14 DIAGNOSIS — R1311 Dysphagia, oral phase: Secondary | ICD-10-CM | POA: Diagnosis not present

## 2015-10-14 DIAGNOSIS — Z5181 Encounter for therapeutic drug level monitoring: Secondary | ICD-10-CM | POA: Diagnosis not present

## 2015-10-14 DIAGNOSIS — I509 Heart failure, unspecified: Secondary | ICD-10-CM | POA: Diagnosis not present

## 2015-10-14 LAB — BASIC METABOLIC PANEL
Anion gap: 6 (ref 5–15)
BUN: 20 mg/dL (ref 6–20)
CALCIUM: 8 mg/dL — AB (ref 8.9–10.3)
CO2: 26 mmol/L (ref 22–32)
CREATININE: 1.01 mg/dL — AB (ref 0.44–1.00)
Chloride: 108 mmol/L (ref 101–111)
GFR calc non Af Amer: 52 mL/min — ABNORMAL LOW (ref >60)
GLUCOSE: 101 mg/dL — AB (ref 65–99)
Potassium: 3.4 mmol/L — ABNORMAL LOW (ref 3.5–5.1)
Sodium: 140 mmol/L (ref 135–145)

## 2015-10-14 LAB — CULTURE, BLOOD (ROUTINE X 2): CULTURE: NO GROWTH

## 2015-10-14 LAB — GLUCOSE, CAPILLARY
GLUCOSE-CAPILLARY: 169 mg/dL — AB (ref 65–99)
GLUCOSE-CAPILLARY: 101 mg/dL — AB (ref 65–99)
Glucose-Capillary: 134 mg/dL — ABNORMAL HIGH (ref 65–99)

## 2015-10-14 MED ORDER — DM-GUAIFENESIN ER 30-600 MG PO TB12: 1.0000 | ORAL_TABLET | Freq: Two times a day (BID) | ORAL | Status: DC

## 2015-10-14 MED ORDER — TIOTROPIUM BROMIDE MONOHYDRATE 18 MCG IN CAPS: 18.0000 ug | ORAL_CAPSULE | Freq: Every day | RESPIRATORY_TRACT | Status: DC

## 2015-10-14 MED ORDER — POTASSIUM CHLORIDE CRYS ER 20 MEQ PO TBCR
20.0000 meq | EXTENDED_RELEASE_TABLET | Freq: Once | ORAL | Status: AC
  Administered 2015-10-14: 20 meq via ORAL
  Filled 2015-10-14: qty 1

## 2015-10-14 MED ORDER — PREDNISONE 20 MG PO TABS: ORAL_TABLET | ORAL | Status: DC

## 2015-10-14 MED ORDER — PREDNISONE 20 MG PO TABS: 40.0000 mg | ORAL_TABLET | Freq: Every day | ORAL | Status: DC

## 2015-10-14 MED ORDER — RIVAROXABAN 20 MG PO TABS: 20.0000 mg | ORAL_TABLET | Freq: Every day | ORAL | Status: DC

## 2015-10-14 MED ORDER — TORSEMIDE 10 MG PO TABS: 10.0000 mg | ORAL_TABLET | Freq: Two times a day (BID) | ORAL | Status: DC

## 2015-10-14 NOTE — Clinical Social Work Placement (Signed)
[facility] is [facility]. Patient & son, Ed made aware. Discharge packet given to RN, Annabelle Harmanana. [facility] called for transport.     Lincoln MaxinKelly Geovanny Sartin, LCSW [facility] Clinical Social Worker cell #: [phone]    CLINICAL SOCIAL WORK PLACEMENT  NOTE  Date:  10/14/2015  Patient Details  Name: [name] [name] MRN: [id number] Date of Birth: [birth year]  Clinical Social Work is seeking post-discharge placement for this patient at the Skilled  Nursing Facility level of care (*CSW will initial, date and re-position this form in  chart as items are completed):  Yes   Patient/family provided with [facility] Clinical Social Work Department's list of facilities offering this level of care within the geographic area requested by the patient (or if unable, by the patient's family).  Yes   Patient/family informed of their freedom to choose among providers that offer the needed level of care, that participate in Medicare, Medicaid or managed care program needed by the patient, have an available bed and are willing to accept the patient.  Yes   Patient/family informed of [place]'s ownership interest in [facility] and [facility], as well as of the fact that they are under no obligation to receive care at these facilities.  PASRR submitted to EDS on 10/12/15     PASRR number received on 10/12/15     Existing PASRR number confirmed on       FL2 transmitted to all facilities in geographic area requested by pt/family on 10/12/15     FL2 transmitted to all facilities within larger geographic area on       Patient informed that his/her managed care company has contracts with or will negotiate with certain facilities, including the following:        Yes   Patient/family informed of bed offers received.  [facility]     Physician recommends and patient chooses bed at      [facility].  Patient to be transferred to facility by [facility]     Patient family notified on 10/14/15 of transfer.  Name of family member notified:  patient's son, Ed via phone     PHYSICIAN       Additional Comment:    _______________________________________________ [name], [place] Nies F, LCSW 10/14/2015, 12:56 PM

## 2015-10-14 NOTE — Discharge Summary (Signed)
Physician Discharge Summary  Rhylee Pucillo Lozier-Arps ZOX:[id number] DOB: 1940-11-14 DOA: 10/09/2015  PCP: Sanda Linger, MD  Admit date: 10/09/2015 Discharge date: 10/14/2015  Admitted From: Home Disposition:  SNF.  Recommendations for Outpatient Follow-up:  1. Follow up with PCP in 1-2 weeks 2. Please obtain BMP/ in 2 days to monitor cr level.  3. Needs to follow up with cardiology for stress test.     Discharge Condition: Stable.  CODE STATUS: Full code.  Diet recommendation: Heart Healthy   Brief/Interim Summary: 75 yo F with Hx COPD, asthma, PAF on Xarelto, DM, HTN, hypothyroidism and chronic diastolic CHF who presented with 2 weeks worsening dyspnea and cough, as well as confusion.  At the time she was admitted she was obtunded and hypoxic, and had to be intubated for a COPD/asthma flare. She was abel to be extubated the following day.    Acute Hypoxic & Hypercarbic Respiratory Failure due to a Severe Asthma Exacerbation prednisone to be weaned to off over 7 days - stop abx - cont current nebs - appears stable at this time - evaluate sats w/ ambulation  Nebulizer treatments.   Hypophosphatemia Corrected   AKI - resolved Resolved w/ volume resuscitation   Coag Neg Staph bacteremia v/s contaminant 1/2 bottles, probably a contaminant - no clinical evidence of ongoing infection - if fevers recur, will have to consider eval for occult source/SBE  Asymptomatic E coli bacteruria Has completed 3 days of abx tx - stop abx and follow   Lactic/Metabolic Acidosis Resolved  HTN BP poorly controlled - adjust tx and follow trend  PAF - Chronic Anticoagulation Xarelto as outpatient - rate controlled   Elevated troponin Peak 0.86 - Cards consulted - TTE unrevealing - to have an outpatient stress test per Cards  Chronic diastolic CHF EF 65% this admit with no wall motion abnormalities - no clinical evidence of volume overload  Filed Weights   10/11/15 1218 10/12/15 0509  10/13/15 0506  Weight: 101.4 kg (223 lb 8.7 oz) 103.9 kg (229 lb 0.9 oz) 102.921 kg (226 lb 14.4 oz)   DM -CBG reasonably controlled  Hypothyroidism -Levothyroxine to continue   Severely deconditioned  For SNF placement for short term rehab stay -transfer to SNF      Discharge Diagnoses:    Elevated troponin I level   Acute Respiratory failure (HCC)   Asthma with acute exacerbation   Chronic obstructive pulmonary disease (HCC)   Asymptomatic bacteriuria    Discharge Instructions      Discharge Instructions    Diet - low sodium heart healthy    Complete by:  As directed      Increase activity slowly    Complete by:  As directed             Medication List    STOP taking these medications        Tiotropium Bromide Monohydrate 1.25 MCG/ACT Aers  Commonly known as:  SPIRIVA RESPIMAT  Replaced by:  tiotropium 18 MCG inhalation capsule     tolterodine 4 MG 24 hr capsule  Commonly known as:  DETROL LA      TAKE these medications        albuterol 108 (90 Base) MCG/ACT inhaler  Commonly known as:  PROAIR HFA  Inhale 2 puffs into the lungs every 6 (six) hours as needed for wheezing or shortness of breath.     budesonide-formoterol 160-4.5 MCG/ACT inhaler  Commonly known as:  SYMBICORT  Inhale 2 puffs into the lungs 2 (  two) times daily.     dextromethorphan-guaiFENesin 30-600 MG 12hr tablet  Commonly known as:  MUCINEX DM  Take 1 tablet by mouth 2 (two) times daily.     esomeprazole 40 MG capsule  Commonly known as:  NEXIUM  Take 1 capsule (40 mg total) by mouth 2 (two) times daily before a meal.     fluticasone 50 MCG/ACT nasal spray  Commonly known as:  FLONASE  Place 2 sprays into both nostrils daily.     levothyroxine 50 MCG tablet  Commonly known as:  SYNTHROID  Take 1 tablet (50 mcg total) by mouth daily before breakfast.     loratadine 10 MG tablet  Commonly known as:  CLARITIN  Take 10 mg by mouth daily.     metoprolol tartrate 25 MG  tablet  Commonly known as:  LOPRESSOR  TAKE 1 TABLET (25 MG TOTAL) BY MOUTH 2 (TWO) TIMES DAILY.     montelukast 10 MG tablet  Commonly known as:  SINGULAIR  Take 1 tablet (10 mg total) by mouth at bedtime.     predniSONE 20 MG tablet  Commonly known as:  DELTASONE  Take 2 tablets for 4 days then 1 tablet for 3 days     rivaroxaban 20 MG Tabs tablet  Commonly known as:  XARELTO  Take 1 tablet (20 mg total) by mouth daily with breakfast.     sodium chloride 0.65 % Soln nasal spray  Commonly known as:  OCEAN  Place 2 sprays into both nostrils 4 (four) times daily.     telmisartan 80 MG tablet  Commonly known as:  MICARDIS  Take 1 tablet (80 mg total) by mouth daily.     tiotropium 18 MCG inhalation capsule  Commonly known as:  SPIRIVA  Place 1 capsule (18 mcg total) into inhaler and inhale daily.     torsemide 10 MG tablet  Commonly known as:  DEMADEX  Take 1 tablet (10 mg total) by mouth 2 (two) times daily.       Follow-up Information    Follow up with Corine Shelter, PA-C On 10/26/2015.   Specialties:  Cardiology, Radiology   Why:  10:00AM. Cardiology followup   Contact information:   [address] [place] [address] [phone]       Follow up with Sanda Linger, MD In 3 weeks.   Specialty:  Internal Medicine   Contact information:   [address] [place] [address] [phone]      Allergies  Allergen Reactions  . Tetracycline Swelling    Swollen tongue  . Acetaminophen Other (See Comments)    ASTHMA MORE SEVERE  . Cardizem [Diltiazem Hcl] Swelling    edema  . Cefaclor Hives, Itching and Rash  . Cephalexin Hives, Itching and Rash  . Clindamycin Hives, Itching and Rash  . Crestor [Rosuvastatin] Other (See Comments)    Muscle aches  . Ibuprofen Other (See Comments)    MAKES ASTHMA WORSE  . Nsaids Other (See Comments)    Respiratory-Asthma WORSE  . Penicillins Hives    Has patient had a PCN reaction causing immediate  rash, facial/tongue/throat swelling, SOB or lightheadedness with hypotension: Yes Has patient had a PCN reaction causing severe rash involving mucus membranes or skin necrosis: No Has patient had a PCN reaction that required hospitalization No Has patient had a PCN reaction occurring within the last 10 years: No If all of the above answers are "NO", then may proceed with Cephalosporin  use.   . Pravastatin Sodium Other (See Comments)    MYALGIAS  . Simvastatin Other (See Comments)    MYALGIAS  . Sulfonamide Derivatives Hives, Itching and Rash  . Levaquin [Levofloxacin In D5w] Rash  . Lisinopril Cough    Consultations:  CCM   Procedures/Studies: Ct Head Wo Contrast  10/09/2015  CLINICAL DATA:  Nonresponsive, respiratory distress. EXAM: CT HEAD WITHOUT CONTRAST TECHNIQUE: Contiguous axial images were obtained from the base of the skull through the vertex without intravenous contrast. COMPARISON:  None. FINDINGS: Mild right maxillary sinusitis is noted. Minimal chronic ischemic white matter disease is noted. No mass effect or midline shift is noted. Ventricular size is within normal limits. There is no evidence of mass lesion, hemorrhage or acute infarction. IMPRESSION: Mild right maxillary sinusitis. Minimal chronic ischemic white matter disease. No acute intracranial abnormality seen. Electronically Signed   By: Lupita Raider, M.D.   On: 10/09/2015 16:14   Dg Chest Portable 1 View  10/09/2015  CLINICAL DATA:  Respiratory distress. EXAM: PORTABLE CHEST 1 VIEW COMPARISON:  Radiograph of March 30, 2015. FINDINGS: The heart size and mediastinal contours are within normal limits. No pneumothorax or pleural effusion is noted. Endotracheal tube is seen projected over tracheal air shadow with distal tip 2.5 cm above the carina. Both lungs are clear. The visualized skeletal structures are unremarkable. IMPRESSION: Endotracheal tube in grossly good position. No acute cardiopulmonary abnormality  seen. Electronically Signed   By: Lupita Raider, M.D.   On: 10/09/2015 15:48   Dg Abd Portable 1v  10/09/2015  CLINICAL DATA:  Orogastric tube placement. EXAM: PORTABLE ABDOMEN - 1 VIEW COMPARISON:  None. FINDINGS: Enteric catheter descends from the thorax to the central abdomen in a straight line. Lack of gastric bubble, and somewhat unusual course of the catheter make confirmation of positioning difficult. The bowel gas pattern is nonobstructive. No radio-opaque calculi or other significant radiographic abnormality are seen. IMPRESSION: Enteric catheter descends from the thorax to the central abdomen in a straight line. Although it may be within the gastric body, radiographic confirmation is difficult due to its somewhat unusual course and lack of gastric bubble. Electronically Signed   By: Ted Mcalpine M.D.   On: 10/09/2015 22:28   (Echo, Carotid, EGD, Colonoscopy, ERCP)    Subjective: She is feeling better, less weak, breathing better/   Discharge Exam: Filed Vitals:   10/14/15 0453 10/14/15 1013  BP: 173/66 141/72  Pulse: 62 65  Temp: 98 F (36.7 C)   Resp: 20    Filed Vitals:   10/13/15 2057 10/14/15 0453 10/14/15 0905 10/14/15 1013  BP: 125/46 173/66  141/72  Pulse: 67 62  65  Temp: 97.7 F (36.5 C) 98 F (36.7 C)    TempSrc: Oral Oral    Resp: 20 20    Height:      Weight:  100.5 kg (221 lb 9 oz)    SpO2: 95% 96% 95%     General: Pt is alert, awake, not in acute distress Cardiovascular: RRR, S1/S2 +, no rubs, no gallops Respiratory: CTA bilaterally, no wheezing, no rhonchi Abdominal: Soft, NT, ND, bowel sounds + Extremities: no edema, no cyanosis    The results of significant diagnostics from this hospitalization (including imaging, microbiology, ancillary and laboratory) are listed below for reference.     Microbiology: Recent Results (from the past 240 hour(s))  Culture, blood (routine x 2)     Status: Abnormal   Collection Time: 10/09/15  3:32 PM  Result Value Ref Range Status   Specimen Description BLOOD LEFT ANTECUBITAL  Final   Special Requests BOTTLES DRAWN AEROBIC ONLY 5CC  Final   Culture  Setup Time   Final    GRAM POSITIVE COCCI IN CLUSTERS AEROBIC BOTTLE ONLY CRITICAL RESULT CALLED TO, READ BACK BY AND VERIFIED WITH: D ZEIGLER,PHARMD AT 1709 10/10/15 BY L BENFIELD    Culture (A)  Final    STAPHYLOCOCCUS SPECIES (COAGULASE NEGATIVE) THE SIGNIFICANCE OF ISOLATING THIS ORGANISM FROM A SINGLE SET OF BLOOD CULTURES WHEN MULTIPLE SETS ARE DRAWN IS UNCERTAIN. PLEASE NOTIFY THE MICROBIOLOGY DEPARTMENT WITHIN ONE WEEK IF SPECIATION AND SENSITIVITIES ARE REQUIRED. Performed at [facility]    Report Status 10/12/2015 FINAL  Final  Blood Culture ID Panel (Reflexed)     Status: Abnormal   Collection Time: 10/09/15  3:32 PM  Result Value Ref Range Status   Enterococcus species NOT DETECTED NOT DETECTED Final   Vancomycin resistance NOT DETECTED NOT DETECTED Final   Listeria monocytogenes NOT DETECTED NOT DETECTED Final   Staphylococcus species DETECTED (A) NOT DETECTED Final    Comment: CRITICAL RESULT CALLED TO, READ BACK BY AND VERIFIED WITH: D ZEIGLER,PHARMD AT 1709 10/10/15 BY L BENFIELD    Staphylococcus aureus NOT DETECTED NOT DETECTED Final   Methicillin resistance NOT DETECTED NOT DETECTED Final   Streptococcus species NOT DETECTED NOT DETECTED Final   Streptococcus agalactiae NOT DETECTED NOT DETECTED Final   Streptococcus pneumoniae NOT DETECTED NOT DETECTED Final   Streptococcus pyogenes NOT DETECTED NOT DETECTED Final   Acinetobacter baumannii NOT DETECTED NOT DETECTED Final   Enterobacteriaceae species NOT DETECTED NOT DETECTED Final   Enterobacter cloacae complex NOT DETECTED NOT DETECTED Final   Escherichia coli NOT DETECTED NOT DETECTED Final   Klebsiella oxytoca NOT DETECTED NOT DETECTED Final   Klebsiella pneumoniae NOT DETECTED NOT DETECTED Final   Proteus species NOT DETECTED NOT DETECTED Final    Serratia marcescens NOT DETECTED NOT DETECTED Final   Carbapenem resistance NOT DETECTED NOT DETECTED Final   Haemophilus influenzae NOT DETECTED NOT DETECTED Final   Neisseria meningitidis NOT DETECTED NOT DETECTED Final   Pseudomonas aeruginosa NOT DETECTED NOT DETECTED Final   Candida albicans NOT DETECTED NOT DETECTED Final   Candida glabrata NOT DETECTED NOT DETECTED Final   Candida krusei NOT DETECTED NOT DETECTED Final   Candida parapsilosis NOT DETECTED NOT DETECTED Final   Candida tropicalis NOT DETECTED NOT DETECTED Final    Comment: Performed at [facility]  Culture, blood (routine x 2)     Status: None (Preliminary result)   Collection Time: 10/09/15  4:40 PM  Result Value Ref Range Status   Specimen Description BLOOD LEFT FOREARM  Final   Special Requests IN PEDIATRIC BOTTLE 3CC  Final   Culture   Final    NO GROWTH 4 DAYS Performed at [facility]    Report Status PENDING  Incomplete  MRSA PCR Screening     Status: None   Collection Time: 10/09/15  8:21 PM  Result Value Ref Range Status   MRSA by PCR NEGATIVE NEGATIVE Final    Comment:        The GeneXpert MRSA Assay (FDA approved for NASAL specimens only), is one component of a comprehensive MRSA colonization surveillance program. It is not intended to diagnose MRSA infection nor to guide or monitor treatment for MRSA infections.   Urine culture     Status: Abnormal   Collection Time: 10/10/15  1:16 PM  Result Value Ref Range Status   Specimen Description URINE, CATHETERIZED  Final   Special Requests Normal  Final   Culture >=100,000 COLONIES/mL ESCHERICHIA COLI (A)  Final   Report Status 10/12/2015 FINAL  Final   Organism ID, Bacteria ESCHERICHIA COLI (A)  Final      Susceptibility   Escherichia coli - MIC*    AMPICILLIN <=2 SENSITIVE Sensitive     CEFAZOLIN <=4 SENSITIVE Sensitive     CEFTRIAXONE <=1 SENSITIVE Sensitive     CIPROFLOXACIN <=0.25 SENSITIVE Sensitive     GENTAMICIN <=1  SENSITIVE Sensitive     IMIPENEM <=0.25 SENSITIVE Sensitive     NITROFURANTOIN <=16 SENSITIVE Sensitive     TRIMETH/SULFA <=20 SENSITIVE Sensitive     AMPICILLIN/SULBACTAM <=2 SENSITIVE Sensitive     PIP/TAZO <=4 SENSITIVE Sensitive     * >=100,000 COLONIES/mL ESCHERICHIA COLI     Labs: BNP (last 3 results)  Recent Labs  01/20/15 1850 03/30/15 1733  BNP 48.5 290.7*   Basic Metabolic Panel:  Recent Labs Lab 10/09/15 1600  10/10/15 0344 10/11/15 0310 10/12/15 0433 10/13/15 0506 10/14/15 0508  NA  --   < > 139 139 140 141 140  K  --   < > 2.9* 4.3 4.1 4.0 3.4*  CL  --   < > 110 112* 114* 113* 108  CO2  --   < > 19* 22 21* 22 26  GLUCOSE  --   < > 227* 150* 107* 110* 101*  BUN  --   < > 20 19 21* 16 20  CREATININE  --   < > 1.01* 0.88 1.05* 0.91 1.01*  CALCIUM  --   < > 7.7* 8.4* 8.3* 8.3* 8.0*  MG 2.1  --  1.6* 2.7* 2.2  --   --   PHOS 6.6*  --  2.5 2.1* 1.7* 3.4  --   < > = values in this interval not displayed. Liver Function Tests:  Recent Labs Lab 10/09/15 1622 10/11/15 0310 10/12/15 0433  AST 36  --   --   ALT 25  --   --   ALKPHOS 78  --   --   BILITOT 0.8  --   --   PROT 6.2*  --   --   ALBUMIN 3.3* 3.0* 2.9*    Recent Labs Lab 10/09/15 1622  LIPASE 28   No results for input(s): AMMONIA in the last 168 hours. CBC:  Recent Labs Lab 10/09/15 1622 10/09/15 1650 10/10/15 1811 10/11/15 1126 10/12/15 0433 10/13/15 0506  WBC 19.9*  --  21.2* 23.7* 18.5* 11.8*  NEUTROABS 17.5*  --  19.3*  --   --   --   HGB 11.0* 11.2* 10.5* 10.9* 10.7* 11.3*  HCT 33.3* 33.0* 31.2* 32.4* 32.2* 34.2*  MCV 92.0  --  90.2 90.3 89.7 89.5  PLT 234  --  231 245 249 255   Cardiac Enzymes:  Recent Labs Lab 10/10/15 0025 10/10/15 0344 10/10/15 1055 10/10/15 1811 10/10/15 2251  TROPONINI 0.53* 0.86* 0.64* 0.49* 0.50*   BNP: Invalid input(s): POCBNP CBG:  Recent Labs Lab 10/13/15 0816 10/13/15 1226 10/13/15 1753 10/13/15 2129 10/14/15 0725  GLUCAP  134* 117* 122* 169* 101*   D-Dimer No results for input(s): DDIMER in the last 72 hours. Hgb A1c No results for input(s): HGBA1C in the last 72 hours. Lipid Profile  Recent Labs  10/12/15 0433  CHOL 162  HDL 51  LDLCALC 84  TRIG 136  CHOLHDL  3.2   Thyroid function studies No results for input(s): TSH, T4TOTAL, T3FREE, THYROIDAB in the last 72 hours.  Invalid input(s): FREET3 Anemia work up No results for input(s): VITAMINB12, FOLATE, FERRITIN, TIBC, IRON, RETICCTPCT in the last 72 hours. Urinalysis    Component Value Date/Time   COLORURINE YELLOW 10/09/2015 1623   APPEARANCEUR CLOUDY* 10/09/2015 1623   LABSPEC 1.014 10/09/2015 1623   PHURINE 5.0 10/09/2015 1623   GLUCOSEU NEGATIVE 10/09/2015 1623   GLUCOSEU NEGATIVE 07/08/2015 1123   HGBUR NEGATIVE 10/09/2015 1623   BILIRUBINUR NEGATIVE 10/09/2015 1623   KETONESUR NEGATIVE 10/09/2015 1623   PROTEINUR NEGATIVE 10/09/2015 1623   UROBILINOGEN 0.2 07/08/2015 1123   NITRITE NEGATIVE 10/09/2015 1623   LEUKOCYTESUR MODERATE* 10/09/2015 1623   Sepsis Labs Invalid input(s): PROCALCITONIN,  WBC,  LACTICIDVEN Microbiology Recent Results (from the past 240 hour(s))  Culture, blood (routine x 2)     Status: Abnormal   Collection Time: 10/09/15  3:32 PM  Result Value Ref Range Status   Specimen Description BLOOD LEFT ANTECUBITAL  Final   Special Requests BOTTLES DRAWN AEROBIC ONLY 5CC  Final   Culture  Setup Time   Final    GRAM POSITIVE COCCI IN CLUSTERS AEROBIC BOTTLE ONLY CRITICAL RESULT CALLED TO, READ BACK BY AND VERIFIED WITH: D ZEIGLER,PHARMD AT 1709 10/10/15 BY L BENFIELD    Culture (A)  Final    STAPHYLOCOCCUS SPECIES (COAGULASE NEGATIVE) THE SIGNIFICANCE OF ISOLATING THIS ORGANISM FROM A SINGLE SET OF BLOOD CULTURES WHEN MULTIPLE SETS ARE DRAWN IS UNCERTAIN. PLEASE NOTIFY THE MICROBIOLOGY DEPARTMENT WITHIN ONE WEEK IF SPECIATION AND SENSITIVITIES ARE REQUIRED. Performed at [facility]    Report Status  10/12/2015 FINAL  Final  Blood Culture ID Panel (Reflexed)     Status: Abnormal   Collection Time: 10/09/15  3:32 PM  Result Value Ref Range Status   Enterococcus species NOT DETECTED NOT DETECTED Final   Vancomycin resistance NOT DETECTED NOT DETECTED Final   Listeria monocytogenes NOT DETECTED NOT DETECTED Final   Staphylococcus species DETECTED (A) NOT DETECTED Final    Comment: CRITICAL RESULT CALLED TO, READ BACK BY AND VERIFIED WITH: D ZEIGLER,PHARMD AT 1709 10/10/15 BY L BENFIELD    Staphylococcus aureus NOT DETECTED NOT DETECTED Final   Methicillin resistance NOT DETECTED NOT DETECTED Final   Streptococcus species NOT DETECTED NOT DETECTED Final   Streptococcus agalactiae NOT DETECTED NOT DETECTED Final   Streptococcus pneumoniae NOT DETECTED NOT DETECTED Final   Streptococcus pyogenes NOT DETECTED NOT DETECTED Final   Acinetobacter baumannii NOT DETECTED NOT DETECTED Final   Enterobacteriaceae species NOT DETECTED NOT DETECTED Final   Enterobacter cloacae complex NOT DETECTED NOT DETECTED Final   Escherichia coli NOT DETECTED NOT DETECTED Final   Klebsiella oxytoca NOT DETECTED NOT DETECTED Final   Klebsiella pneumoniae NOT DETECTED NOT DETECTED Final   Proteus species NOT DETECTED NOT DETECTED Final   Serratia marcescens NOT DETECTED NOT DETECTED Final   Carbapenem resistance NOT DETECTED NOT DETECTED Final   Haemophilus influenzae NOT DETECTED NOT DETECTED Final   Neisseria meningitidis NOT DETECTED NOT DETECTED Final   Pseudomonas aeruginosa NOT DETECTED NOT DETECTED Final   Candida albicans NOT DETECTED NOT DETECTED Final   Candida glabrata NOT DETECTED NOT DETECTED Final   Candida krusei NOT DETECTED NOT DETECTED Final   Candida parapsilosis NOT DETECTED NOT DETECTED Final   Candida tropicalis NOT DETECTED NOT DETECTED Final    Comment: Performed at [facility]  Culture, blood (routine x 2)  Status: None (Preliminary result)   Collection Time: 10/09/15   4:40 PM  Result Value Ref Range Status   Specimen Description BLOOD LEFT FOREARM  Final   Special Requests IN PEDIATRIC BOTTLE 3CC  Final   Culture   Final    NO GROWTH 4 DAYS Performed at [facility]    Report Status PENDING  Incomplete  MRSA PCR Screening     Status: None   Collection Time: 10/09/15  8:21 PM  Result Value Ref Range Status   MRSA by PCR NEGATIVE NEGATIVE Final    Comment:        The GeneXpert MRSA Assay (FDA approved for NASAL specimens only), is one component of a comprehensive MRSA colonization surveillance program. It is not intended to diagnose MRSA infection nor to guide or monitor treatment for MRSA infections.   Urine culture     Status: Abnormal   Collection Time: 10/10/15  1:16 PM  Result Value Ref Range Status   Specimen Description URINE, CATHETERIZED  Final   Special Requests Normal  Final   Culture >=100,000 COLONIES/mL ESCHERICHIA COLI (A)  Final   Report Status 10/12/2015 FINAL  Final   Organism ID, Bacteria ESCHERICHIA COLI (A)  Final      Susceptibility   Escherichia coli - MIC*    AMPICILLIN <=2 SENSITIVE Sensitive     CEFAZOLIN <=4 SENSITIVE Sensitive     CEFTRIAXONE <=1 SENSITIVE Sensitive     CIPROFLOXACIN <=0.25 SENSITIVE Sensitive     GENTAMICIN <=1 SENSITIVE Sensitive     IMIPENEM <=0.25 SENSITIVE Sensitive     NITROFURANTOIN <=16 SENSITIVE Sensitive     TRIMETH/SULFA <=20 SENSITIVE Sensitive     AMPICILLIN/SULBACTAM <=2 SENSITIVE Sensitive     PIP/TAZO <=4 SENSITIVE Sensitive     * >=100,000 COLONIES/mL ESCHERICHIA COLI     Time coordinating discharge: Over 30 minutes  SIGNED:   Alba Cory, MD  [facility] 10/14/2015, 11:41 AM Pager [phone]  If 7PM-7AM, please contact night-coverage [email or web address] Password [email or web address]

## 2015-10-15 ENCOUNTER — Telehealth: Payer: Self-pay | Admitting: *Deleted

## 2015-10-15 DIAGNOSIS — I5032 Chronic diastolic (congestive) heart failure: Secondary | ICD-10-CM | POA: Diagnosis not present

## 2015-10-15 DIAGNOSIS — J449 Chronic obstructive pulmonary disease, unspecified: Secondary | ICD-10-CM | POA: Diagnosis not present

## 2015-10-15 DIAGNOSIS — E441 Mild protein-calorie malnutrition: Secondary | ICD-10-CM | POA: Diagnosis not present

## 2015-10-15 DIAGNOSIS — E039 Hypothyroidism, unspecified: Secondary | ICD-10-CM | POA: Diagnosis not present

## 2015-10-15 DIAGNOSIS — J9620 Acute and chronic respiratory failure, unspecified whether with hypoxia or hypercapnia: Secondary | ICD-10-CM | POA: Diagnosis not present

## 2015-10-15 DIAGNOSIS — D649 Anemia, unspecified: Secondary | ICD-10-CM | POA: Diagnosis not present

## 2015-10-15 DIAGNOSIS — I10 Essential (primary) hypertension: Secondary | ICD-10-CM | POA: Diagnosis not present

## 2015-10-15 DIAGNOSIS — E784 Other hyperlipidemia: Secondary | ICD-10-CM | POA: Diagnosis not present

## 2015-10-15 NOTE — Telephone Encounter (Signed)
Pt was on TCM list admitted for acute hypoxic & hypercarbic respiratory failure due to severe asthma attack. Pt was D/C 10/14/15 sen to SNF...[name]/lmb

## 2015-10-23 ENCOUNTER — Encounter: Payer: Self-pay | Admitting: Internal Medicine

## 2015-10-26 ENCOUNTER — Encounter: Payer: Self-pay | Admitting: Cardiology

## 2015-10-26 ENCOUNTER — Ambulatory Visit (INDEPENDENT_AMBULATORY_CARE_PROVIDER_SITE_OTHER): Payer: Medicare Other | Admitting: Cardiology

## 2015-10-26 VITALS — BP 126/63 | HR 61 | Ht 66.0 in | Wt 211.4 lb

## 2015-10-26 DIAGNOSIS — I10 Essential (primary) hypertension: Secondary | ICD-10-CM

## 2015-10-26 DIAGNOSIS — I480 Paroxysmal atrial fibrillation: Secondary | ICD-10-CM | POA: Insufficient documentation

## 2015-10-26 DIAGNOSIS — J9601 Acute respiratory failure with hypoxia: Secondary | ICD-10-CM | POA: Diagnosis not present

## 2015-10-26 DIAGNOSIS — I2489 Other forms of acute ischemic heart disease: Secondary | ICD-10-CM

## 2015-10-26 DIAGNOSIS — Z7901 Long term (current) use of anticoagulants: Secondary | ICD-10-CM

## 2015-10-26 DIAGNOSIS — I248 Other forms of acute ischemic heart disease: Secondary | ICD-10-CM | POA: Insufficient documentation

## 2015-10-26 NOTE — Assessment & Plan Note (Signed)
No recurrence during her recent hospitalization

## 2015-10-26 NOTE — Assessment & Plan Note (Signed)
Admitted 10/09/15 with respiratory failure requiring intubation. Felt to be asthmatic COPD exacerbation.

## 2015-10-26 NOTE — Patient Instructions (Signed)
Medication Instructions:  Continue current medications  Labwork: NONE  Testing/Procedures: NONE  Follow-Up: Your physician wants you to follow-up in: 6 Months with Dr Jens Somrenshaw. You will receive a reminder letter in the mail two months in advance. If you don't receive a letter, please call our office to schedule the follow-up appointment.   Any Other Special Instructions Will Be Listed Below (If Applicable).   If you need a refill on your cardiac medications before your next appointment, please call your pharmacy.

## 2015-10-26 NOTE — Assessment & Plan Note (Signed)
Controlled.  

## 2015-10-26 NOTE — Assessment & Plan Note (Signed)
We were consulted for elevated Troponin level- pk 0.86 - in setting of COPD exacerbation and respiratory failure

## 2015-10-26 NOTE — Progress Notes (Signed)
Cardiology Office Note    Date:  10/26/2015   ID:  [name] [name], DOB [birth year], MRN [id number]  PCP:  Sanda Linger, MD  Cardiologist:  Dr Jens Som   Chief Complaint  Patient presents with  . Hospitalization Follow-up    patient reports no complaints    History of Present Illness:  [name] [name] is a 75 y.o. female who is in the office today for a post hospital follow up after her recent admission for asthmatic COPD exacerbation with respiratory failure requiring intubation. We were asked to see her for an elevated Troponin which we felt was secondary to demand ischemia. She does have a prior history of PAF but fortunately this was not an issue during her hospitalization. An echo done showed small LV with hyperdynamic LVF. Since discharge she has been stable, she did go to a SNF for rehab but hopes to return home soon.     Past Medical History  Diagnosis Date  . Migraine, unspecified, without mention of intractable migraine without mention of status migrainosus   . HLD (hyperlipidemia)   . Aortic insufficiency   . Colon polyps   . Hypothyroidism   . GERD (gastroesophageal reflux disease)   . Osteoarthritis   . HTN (hypertension)   . Depression   . PAF (paroxysmal atrial fibrillation) (HCC)     on coumadin and Cardiazem  . Asthma   . Allergic rhinitis   . Chronic anticoagulation     on coumadin  . Obesity   . Diastolic dysfunction   . S/P cardiac cath 2003    no obstructive disease     Past Surgical History  Procedure Laterality Date  . Appendectomy    . Abdominal hysterectomy    . Tubal ligation    . US echocardiography  2007  . Replacement total knee Right     Current Medications: Outpatient Prescriptions Prior to Visit  Medication Sig Dispense Refill  . albuterol (PROAIR HFA) 108 (90 Base) MCG/ACT inhaler Inhale 2 puffs into the lungs every 6 (six) hours as needed for wheezing or shortness of breath. 8.5 each 5  . budesonide-formoterol  (SYMBICORT) 160-4.5 MCG/ACT inhaler Inhale 2 puffs into the lungs 2 (two) times daily. 1 Inhaler 11  . dextromethorphan-guaiFENesin (MUCINEX DM) 30-600 MG 12hr tablet Take 1 tablet by mouth 2 (two) times daily. 30 tablet 0  . esomeprazole (NEXIUM) 40 MG capsule Take 1 capsule (40 mg total) by mouth 2 (two) times daily before a meal. 180 capsule 3  . fluticasone (FLONASE) 50 MCG/ACT nasal spray Place 2 sprays into both nostrils daily. 48 g 1  . levothyroxine (SYNTHROID) 50 MCG tablet Take 1 tablet (50 mcg total) by mouth daily before breakfast. 90 tablet 3  . loratadine (CLARITIN) 10 MG tablet Take 10 mg by mouth daily.    . metoprolol tartrate (LOPRESSOR) 25 MG tablet TAKE 1 TABLET (25 MG TOTAL) BY MOUTH 2 (TWO) TIMES DAILY. 180 tablet 3  . montelukast (SINGULAIR) 10 MG tablet Take 1 tablet (10 mg total) by mouth at bedtime. 90 tablet 3  . predniSONE (DELTASONE) 20 MG tablet Take 2 tablets for 4 days then 1 tablet for 3 days 11 tablet 0  . rivaroxaban (XARELTO) 20 MG TABS tablet Take 1 tablet (20 mg total) by mouth daily with breakfast. 30 tablet 0  . sodium chloride (OCEAN) 0.65 % SOLN nasal spray Place 2 sprays into both nostrils 4 (four) times daily. (Patient taking differently: Place 2 sprays into both nostrils  2 (two) times daily. ) 90 mL 0  . telmisartan (MICARDIS) 80 MG tablet Take 1 tablet (80 mg total) by mouth daily. 90 tablet 3  . tiotropium (SPIRIVA) 18 MCG inhalation capsule Place 1 capsule (18 mcg total) into inhaler and inhale daily. 30 capsule 12  . torsemide (DEMADEX) 10 MG tablet Take 1 tablet (10 mg total) by mouth 2 (two) times daily. 30 tablet 0   No facility-administered medications prior to visit.     Allergies:   Tetracycline; Acetaminophen; Cardizem; Cefaclor; Cephalexin; Clindamycin; Crestor; Ibuprofen; Nsaids; Penicillins; Pravastatin sodium; Simvastatin; Sulfonamide derivatives; Levaquin; and Lisinopril   Social History   Social History  . Marital Status: Married     Spouse Name: N/A  . Number of Children: 3  . Years of Education: N/A   Occupational History  . retired [employer]    Social History Main Topics  . Smoking status: Never Smoker   . Smokeless tobacco: Never Used  . Alcohol Use: 3.0 oz/week    5 Glasses of wine per week  . Drug Use: No  . Sexual Activity: Not Currently   Other Topics Concern  . None   Social History Narrative   Recently widowed, lives alone   ROS:   Please see the history of present illness.    ROS All other systems reviewed and are negative.   PHYSICAL EXAM:   VS:  BP 126/63 mmHg  Pulse 61  Ht 5\' 6"  (1.676 m)  Wt 211 lb 6.4 oz (95.89 kg)  BMI 34.14 kg/m2  LMP 12/01/2010   GEN: Well nourished, well developed, in no acute distress HEENT: normal Neck: no JVD, carotid bruits, or masses Cardiac: RRR; no murmurs, rubs, or gallops,no edema  Respiratory:  clear to auscultation bilaterally, normal work of breathing GI: obese, soft, nontender, nondistended, + BS MS: no deformity or atrophy Skin: warm and dry, no rash Neuro:  Alert and Oriented x 3, Strength and sensation are intact Psych: euthymic mood, full affect  Wt Readings from Last 3 Encounters:  10/26/15 211 lb 6.4 oz (95.89 kg)  10/14/15 221 lb 9 oz (100.5 kg)  10/08/15 209 lb (94.802 kg)      Studies/Labs Reviewed:     Recent Labs: 03/23/2015: Pro B Natriuretic peptide (BNP) 78.0 03/30/2015: B Natriuretic Peptide 290.7* 07/08/2015: TSH 2.94 10/09/2015: ALT 25 10/12/2015: Magnesium 2.2 10/13/2015: Hemoglobin 11.3*; Platelets 255 10/14/2015: BUN 20; Creatinine, Ser 1.01*; Potassium 3.4*; Sodium 140   Lipid Panel    Component Value Date/Time   CHOL 162 10/12/2015 0433   TRIG 136 10/12/2015 0433   HDL 51 10/12/2015 0433   CHOLHDL 3.2 10/12/2015 0433   VLDL 27 10/12/2015 0433   LDLCALC 84 10/12/2015 0433      ASSESSMENT:    1. Acute respiratory failure with hypoxia (HCC)   2. Demand ischemia (HCC)   3. Essential hypertension   4. PAF  (paroxysmal atrial fibrillation) (HCC)   5. Chronic anticoagulation      PLAN:  In order of problems listed above:  1. Same Rx for now- f/u with Dr Jens Somrenshaw in 6 months.     Medication Adjustments/Labs and Tests Ordered: Current medicines are reviewed at length with the patient today.  Concerns regarding medicines are outlined above.  Medication changes, Labs and Tests ordered today are listed in the Patient Instructions below. Patient Instructions  Medication Instructions:  Continue current medications  Labwork: NONE  Testing/Procedures: NONE  Follow-Up: Your physician wants you to follow-up in: 6 Months with  Dr Jens Somrenshaw. You will receive a reminder letter in the mail two months in advance. If you don't receive a letter, please call our office to schedule the follow-up appointment.   Any Other Special Instructions Will Be Listed Below (If Applicable).   If you need a refill on your cardiac medications before your next appointment, please call your pharmacy.      Jolene ProvostSigned, Kerilyn Cortner, PA-C  10/26/2015 10:34 AM    [facility] [address], [place], [place]  [id number] Phone: [phone]; Fax: [phone]

## 2015-10-26 NOTE — Assessment & Plan Note (Signed)
CAHDs Vasc= 4 for HTN, age, sex On Xarelto

## 2015-11-04 ENCOUNTER — Ambulatory Visit (INDEPENDENT_AMBULATORY_CARE_PROVIDER_SITE_OTHER): Payer: Medicare Other | Admitting: Adult Health

## 2015-11-04 ENCOUNTER — Encounter: Payer: Self-pay | Admitting: Adult Health

## 2015-11-04 VITALS — BP 114/60 | HR 57 | Ht 68.0 in | Wt 211.8 lb

## 2015-11-04 DIAGNOSIS — J4520 Mild intermittent asthma, uncomplicated: Secondary | ICD-10-CM | POA: Diagnosis not present

## 2015-11-04 DIAGNOSIS — I248 Other forms of acute ischemic heart disease: Secondary | ICD-10-CM

## 2015-11-04 NOTE — Progress Notes (Signed)
Chief Complaint  Patient presents with  . Hospitalization Follow-up    follow for respiratory failure w/ hypoxia.  pt states she is feeling fine, has some chest congestion.       Tests PFT's 06/2013>>> FEV1 1.61 (69%), DLCO 21.34  Past medical hx Past Medical History  Diagnosis Date  . Migraine, unspecified, without mention of intractable migraine without mention of status migrainosus   . HLD (hyperlipidemia)   . Aortic insufficiency   . Colon polyps   . Hypothyroidism   . GERD (gastroesophageal reflux disease)   . Osteoarthritis   . HTN (hypertension)   . Depression   . PAF (paroxysmal atrial fibrillation) (HCC)     on coumadin and Cardiazem  . Asthma   . Allergic rhinitis   . Chronic anticoagulation     on coumadin  . Obesity   . Diastolic dysfunction   . S/P cardiac cath 2003    no obstructive disease      Past surgical hx, Allergies, Family hx, Social hx all reviewed.  Current Outpatient Prescriptions on File Prior to Visit  Medication Sig  . albuterol (PROAIR HFA) 108 (90 Base) MCG/ACT inhaler Inhale 2 puffs into the lungs every 6 (six) hours as needed for wheezing or shortness of breath.  . budesonide-formoterol (SYMBICORT) 160-4.5 MCG/ACT inhaler Inhale 2 puffs into the lungs 2 (two) times daily.  Marland Kitchen. dextromethorphan-guaiFENesin (MUCINEX DM) 30-600 MG 12hr tablet Take 1 tablet by mouth 2 (two) times daily.  Marland Kitchen. esomeprazole (NEXIUM) 40 MG capsule Take 1 capsule (40 mg total) by mouth 2 (two) times daily before a meal.  . fluticasone (FLONASE) 50 MCG/ACT nasal spray Place 2 sprays into both nostrils daily.  Marland Kitchen. levothyroxine (SYNTHROID) 50 MCG tablet Take 1 tablet (50 mcg total) by mouth daily before breakfast.  . loratadine (CLARITIN) 10 MG tablet Take 10 mg by mouth daily.  . metoprolol tartrate (LOPRESSOR) 25 MG tablet TAKE 1 TABLET (25 MG TOTAL) BY MOUTH 2 (TWO) TIMES DAILY.  . montelukast (SINGULAIR) 10 MG tablet Take 1 tablet (10 mg total) by mouth at bedtime.   . sodium chloride (OCEAN) 0.65 % SOLN nasal spray Place 2 sprays into both nostrils 4 (four) times daily. (Patient taking differently: Place 2 sprays into both nostrils 2 (two) times daily. )  . telmisartan (MICARDIS) 80 MG tablet Take 1 tablet (80 mg total) by mouth daily.  Marland Kitchen. torsemide (DEMADEX) 10 MG tablet Take 1 tablet (10 mg total) by mouth 2 (two) times daily.   No current facility-administered medications on file prior to visit.     Vital Signs BP 114/60 mmHg  Pulse 57  Ht 5\' 8"  (1.727 m)  Wt 211 lb 12.8 oz (96.072 kg)  BMI 32.21 kg/m2  SpO2 97%  LMP 12/01/2010  History of Present Illness [name] [name] is a 75 y.o. female with hx mild intermittent asthma followed by Dr. Delton CoombesByrum (last seen in 2015) with recent hospital admission 6/16-6/21 with acute respiratory failure requiring brief intubation thought secondary to severe asthma exacerbation possibly c/b acute sinusitis. Course c/b EColi UTI and mildly elevated troponin (likely demand ischemia).  She was intubated briefly from 6/16-6/17.  She was treated with IV steroids, IV abx (Avelox) and nebulized BD.  She improved and was extubated.  She was transitioned to PO abx, PO prednisone and discharged home.    She returns today for hospital f/u with her son.  She is back to baseline, feeling well.  Occasional mild DOE otherwise no c/o.  Denies cough, purulent sputum, fever, n/v/d, orthopnea, BLE edema.     Physical Exam  General - pleasant female, No distress  ENT - No sinus tenderness, no oral exudate, no LAN Cardiac - s1s2 regular, no murmur Chest - resps even non labored on RA, rare mild exp wheeze otherwise clear  Back - No focal tenderness Abd - Soft, non-tender Ext - No edema Neuro - Normal strength Skin - No rashes Psych - normal mood, and behavior   Assessment/Plan  Acute exacerbation of asthma - resolved after hospital admission.  Mild intermittent asthma   Patient Instructions  Glad you are doing  better!  Continue symbicort, Albuterol if needed  Continue flonase, add nasal rinse if worsening congestion Follow up with Dr. Delton Coombes in 6 months  Please contact office for sooner follow up if symptoms do not improve or worsen or seek emergency care .     Dirk Dress, NP 11/04/2015  11:59 AM

## 2015-11-04 NOTE — Patient Instructions (Addendum)
Glad you are doing better!  Continue symbicort, Albuterol if needed  Continue flonase, add nasal rinse if worsening congestion Follow up with Dr. Delton CoombesByrum in 6 months  Please contact office for sooner follow up if symptoms do not improve or worsen or seek emergency care .

## 2015-11-09 ENCOUNTER — Ambulatory Visit: Payer: Medicare Other | Admitting: Pulmonary Disease

## 2015-12-01 ENCOUNTER — Encounter: Payer: Self-pay | Admitting: Internal Medicine

## 2015-12-01 ENCOUNTER — Ambulatory Visit (INDEPENDENT_AMBULATORY_CARE_PROVIDER_SITE_OTHER): Payer: Medicare Other | Admitting: Internal Medicine

## 2015-12-01 ENCOUNTER — Other Ambulatory Visit (INDEPENDENT_AMBULATORY_CARE_PROVIDER_SITE_OTHER): Payer: Medicare Other

## 2015-12-01 VITALS — BP 132/72 | HR 63 | Temp 98.0°F | Ht 68.0 in | Wt 217.5 lb

## 2015-12-01 DIAGNOSIS — E038 Other specified hypothyroidism: Secondary | ICD-10-CM | POA: Diagnosis not present

## 2015-12-01 DIAGNOSIS — I10 Essential (primary) hypertension: Secondary | ICD-10-CM

## 2015-12-01 DIAGNOSIS — I519 Heart disease, unspecified: Secondary | ICD-10-CM

## 2015-12-01 DIAGNOSIS — D539 Nutritional anemia, unspecified: Secondary | ICD-10-CM | POA: Diagnosis not present

## 2015-12-01 DIAGNOSIS — I248 Other forms of acute ischemic heart disease: Secondary | ICD-10-CM

## 2015-12-01 DIAGNOSIS — J4521 Mild intermittent asthma with (acute) exacerbation: Secondary | ICD-10-CM

## 2015-12-01 DIAGNOSIS — R4189 Other symptoms and signs involving cognitive functions and awareness: Secondary | ICD-10-CM

## 2015-12-01 DIAGNOSIS — I5189 Other ill-defined heart diseases: Secondary | ICD-10-CM

## 2015-12-01 LAB — BASIC METABOLIC PANEL
BUN: 18 mg/dL (ref 6–23)
CALCIUM: 9.5 mg/dL (ref 8.4–10.5)
CO2: 31 mEq/L (ref 19–32)
Chloride: 101 mEq/L (ref 96–112)
Creatinine, Ser: 0.83 mg/dL (ref 0.40–1.20)
GFR: 70.81 mL/min (ref >60.00)
GLUCOSE: 105 mg/dL — AB (ref 70–99)
Potassium: 3.8 mEq/L (ref 3.5–5.1)
SODIUM: 139 meq/L (ref 135–145)

## 2015-12-01 LAB — CBC WITH DIFFERENTIAL/PLATELET
BASOS ABS: 0 10*3/uL (ref 0.0–0.1)
Basophils Relative: 0.5 % (ref 0.0–3.0)
EOS ABS: 0.4 10*3/uL (ref 0.0–0.7)
Eosinophils Relative: 4.8 % (ref 0.0–5.0)
HCT: 34.1 % — ABNORMAL LOW (ref 36.0–46.0)
Hemoglobin: 11.4 g/dL — ABNORMAL LOW (ref 12.0–15.0)
LYMPHS ABS: 1.6 10*3/uL (ref 0.7–4.0)
LYMPHS PCT: 19.7 % (ref 12.0–46.0)
MCHC: 33.6 g/dL (ref 30.0–36.0)
MCV: 89.4 fl (ref 78.0–100.0)
MONOS PCT: 8.6 % (ref 3.0–12.0)
Monocytes Absolute: 0.7 10*3/uL (ref 0.1–1.0)
NEUTROS PCT: 66.4 % (ref 43.0–77.0)
Neutro Abs: 5.5 10*3/uL (ref 1.4–7.7)
Platelets: 369 10*3/uL (ref 150.0–400.0)
RBC: 3.81 Mil/uL — AB (ref 3.87–5.11)
RDW: 15.1 % (ref 11.5–15.5)
WBC: 8.3 10*3/uL (ref 4.0–10.5)

## 2015-12-01 LAB — IBC PANEL
Iron: 74 ug/dL (ref 42–145)
SATURATION RATIOS: 16.7 % — AB (ref 20.0–50.0)
Transferrin: 317 mg/dL (ref 212.0–360.0)

## 2015-12-01 LAB — FOLATE: FOLATE: 14.2 ng/mL (ref >5.9)

## 2015-12-01 LAB — VITAMIN B12: VITAMIN B 12: 530 pg/mL (ref 211–911)

## 2015-12-01 LAB — FERRITIN: FERRITIN: 46.4 ng/mL (ref 10.0–291.0)

## 2015-12-01 LAB — TSH: TSH: 3.77 u[IU]/mL (ref 0.35–4.50)

## 2015-12-01 MED ORDER — BUDESONIDE-FORMOTEROL FUMARATE 160-4.5 MCG/ACT IN AERO: 2.0000 | INHALATION_SPRAY | Freq: Two times a day (BID) | RESPIRATORY_TRACT | 11 refills | Status: DC

## 2015-12-01 MED ORDER — NEBIVOLOL HCL 5 MG PO TABS: 5.0000 mg | ORAL_TABLET | Freq: Every day | ORAL | 3 refills | Status: AC

## 2015-12-01 MED ORDER — LEVOTHYROXINE SODIUM 75 MCG PO TABS: 75.0000 ug | ORAL_TABLET | Freq: Every day | ORAL | 1 refills | Status: DC

## 2015-12-01 MED ORDER — TORSEMIDE 10 MG PO TABS: 10.0000 mg | ORAL_TABLET | Freq: Two times a day (BID) | ORAL | 3 refills | Status: DC

## 2015-12-01 NOTE — Patient Instructions (Signed)

## 2015-12-01 NOTE — Progress Notes (Signed)
Subjective:  Patient ID: [name] [name], female    DOB: [birth year]  Age: 75 y.o. MRN: [id number]  CC: Asthma   HPI [name] [name] presents for follow-up after a recent admission for flare of asthma that required inpatient treatment and resulted in a brief episode of respiratory distress. She is with her son today and he tells me that her memory is declining and she is having trouble keeping up with her medications. They don't think she has recently been taking her diuretic and today she complains of edema in both lower extremities with a few episodes of shortness of breath. She denies any recent episodes of blurred vision, chest pain, palpitations, near syncope, or fatigue.  Outpatient Medications Prior to Visit  Medication Sig Dispense Refill  . albuterol (PROAIR HFA) 108 (90 Base) MCG/ACT inhaler Inhale 2 puffs into the lungs every 6 (six) hours as needed for wheezing or shortness of breath. 8.5 each 5  . dextromethorphan-guaiFENesin (MUCINEX DM) 30-600 MG 12hr tablet Take 1 tablet by mouth 2 (two) times daily. 30 tablet 0  . esomeprazole (NEXIUM) 40 MG capsule Take 1 capsule (40 mg total) by mouth 2 (two) times daily before a meal. 180 capsule 3  . fluticasone (FLONASE) 50 MCG/ACT nasal spray Place 2 sprays into both nostrils daily. 48 g 1  . loratadine (CLARITIN) 10 MG tablet Take 10 mg by mouth daily.    . montelukast (SINGULAIR) 10 MG tablet Take 1 tablet (10 mg total) by mouth at bedtime. 90 tablet 3  . sodium chloride (OCEAN) 0.65 % SOLN nasal spray Place 2 sprays into both nostrils 4 (four) times daily. (Patient taking differently: Place 2 sprays into both nostrils 2 (two) times daily. ) 90 mL 0  . telmisartan (MICARDIS) 80 MG tablet Take 1 tablet (80 mg total) by mouth daily. 90 tablet 3  . budesonide-formoterol (SYMBICORT) 160-4.5 MCG/ACT inhaler Inhale 2 puffs into the lungs 2 (two) times daily. 1 Inhaler 11  . levothyroxine (SYNTHROID) 50 MCG tablet Take 1 tablet  (50 mcg total) by mouth daily before breakfast. 90 tablet 3  . metoprolol tartrate (LOPRESSOR) 25 MG tablet TAKE 1 TABLET (25 MG TOTAL) BY MOUTH 2 (TWO) TIMES DAILY. 180 tablet 3  . torsemide (DEMADEX) 10 MG tablet Take 1 tablet (10 mg total) by mouth 2 (two) times daily. 30 tablet 0   No facility-administered medications prior to visit.     ROS Review of Systems  Constitutional: Negative.  Negative for appetite change, chills, diaphoresis, fatigue and fever.  HENT: Negative.   Eyes: Negative.  Negative for visual disturbance.  Respiratory: Positive for shortness of breath and wheezing. Negative for cough, choking, chest tightness and stridor.   Cardiovascular: Positive for leg swelling. Negative for chest pain and palpitations.  Gastrointestinal: Negative.  Negative for abdominal pain, constipation, diarrhea, nausea and vomiting.  Endocrine: Negative.   Genitourinary: Negative.  Negative for decreased urine volume, difficulty urinating, dysuria, hematuria and urgency.  Musculoskeletal: Negative.  Negative for arthralgias, back pain, joint swelling and myalgias.  Skin: Negative.  Negative for color change and rash.  Allergic/Immunologic: Negative.   Neurological: Negative.  Negative for dizziness, tremors, weakness, numbness and headaches.  Hematological: Negative.  Negative for adenopathy. Does not bruise/bleed easily.  Psychiatric/Behavioral: Positive for decreased concentration. Negative for agitation, confusion, dysphoric mood, self-injury and suicidal ideas. The patient is not nervous/anxious.     Objective:  BP 132/72 (BP Location: Left Arm, Patient Position: Sitting, Cuff Size: Normal)   Pulse  63   Temp 98 F (36.7 C) (Oral)   Ht  (1.727 m)   Wt 217 lb 8 oz (98.7 kg)   LMP 12/01/2010   BMI 33.07 kg/m   BP Readings from Last 3 Encounters:  12/01/15 132/72  11/04/15 114/60  10/26/15 126/63    Wt Readings from Last 3 Encounters:  12/01/15 217 lb 8 oz (98.7 kg)    11/04/15 211 lb 12.8 oz (96.1 kg)  10/26/15 211 lb 6.4 oz (95.9 kg)    Physical Exam  Constitutional: She is oriented to person, place, and time. She appears well-developed and well-nourished. No distress.  HENT:  Mouth/Throat: Oropharynx is clear and moist. No oropharyngeal exudate.  Eyes: Conjunctivae are normal. Right eye exhibits no discharge. Left eye exhibits no discharge. No scleral icterus.  Neck: Normal range of motion. Neck supple. No JVD present. No tracheal deviation present. No thyromegaly present.  Cardiovascular: Normal rate, regular rhythm, normal heart sounds and intact distal pulses.  Exam reveals no gallop and no friction rub.   No murmur heard. Pulmonary/Chest: Effort normal and breath sounds normal. No stridor. No respiratory distress. She has no wheezes. She has no rales. She exhibits no tenderness.  Abdominal: Soft. Bowel sounds are normal. She exhibits no distension and no mass. There is no tenderness. There is no rebound and no guarding.  Musculoskeletal: Normal range of motion. She exhibits edema (1+ pitting edema in BLE). She exhibits no tenderness or deformity.  Lymphadenopathy:    She has no cervical adenopathy.  Neurological: She is oriented to person, place, and time.  Skin: Skin is warm and dry. No rash noted. She is not diaphoretic. No erythema. No pallor.  Psychiatric: She has a normal mood and affect. Her behavior is normal. Judgment and thought content normal.  Vitals reviewed.   Lab Results  Component Value Date   WBC 8.3 12/01/2015   HGB 11.4 (L) 12/01/2015   HCT 34.1 (L) 12/01/2015   PLT 369.0 12/01/2015   GLUCOSE 105 (H) 12/01/2015   CHOL 162 10/12/2015   TRIG 136 10/12/2015   HDL 51 10/12/2015   LDLCALC 84 10/12/2015   ALT 25 10/09/2015   AST 36 10/09/2015   NA 139 12/01/2015   K 3.8 12/01/2015   CL 101 12/01/2015   CREATININE 0.83 12/01/2015   BUN 18 12/01/2015   CO2 31 12/01/2015   TSH 3.77 12/01/2015   INR 1.23 10/09/2015    HGBA1C 5.9 (H) 10/10/2015   MICROALBUR 0.9 07/08/2015    Ct Head Wo Contrast  Result Date: 10/09/2015 CLINICAL DATA:  Nonresponsive, respiratory distress. EXAM: CT HEAD WITHOUT CONTRAST TECHNIQUE: Contiguous axial images were obtained from the base of the skull through the vertex without intravenous contrast. COMPARISON:  None. FINDINGS: Mild right maxillary sinusitis is noted. Minimal chronic ischemic white matter disease is noted. No mass effect or midline shift is noted. Ventricular size is within normal limits. There is no evidence of mass lesion, hemorrhage or acute infarction. IMPRESSION: Mild right maxillary sinusitis. Minimal chronic ischemic white matter disease. No acute intracranial abnormality seen. Electronically Signed   By: Lupita Raider, M.D.   On: 10/09/2015 16:14   Dg Chest Portable 1 View  Result Date: 10/09/2015 CLINICAL DATA:  Respiratory distress. EXAM: PORTABLE CHEST 1 VIEW COMPARISON:  Radiograph of March 30, 2015. FINDINGS: The heart size and mediastinal contours are within normal limits. No pneumothorax or pleural effusion is noted. Endotracheal tube is seen projected over tracheal air shadow with distal  tip 2.5 cm above the carina. Both lungs are clear. The visualized skeletal structures are unremarkable. IMPRESSION: Endotracheal tube in grossly good position. No acute cardiopulmonary abnormality seen. Electronically Signed   By: Lupita RaiderJames  Green Jr, M.D.   On: 10/09/2015 15:48   Dg Abd Portable 1v  Result Date: 10/09/2015 CLINICAL DATA:  Orogastric tube placement. EXAM: PORTABLE ABDOMEN - 1 VIEW COMPARISON:  None. FINDINGS: Enteric catheter descends from the thorax to the central abdomen in a straight line. Lack of gastric bubble, and somewhat unusual course of the catheter make confirmation of positioning difficult. The bowel gas pattern is nonobstructive. No radio-opaque calculi or other significant radiographic abnormality are seen. IMPRESSION: Enteric catheter descends  from the thorax to the central abdomen in a straight line. Although it may be within the gastric body, radiographic confirmation is difficult due to its somewhat unusual course and lack of gastric bubble. Electronically Signed   By: Ted Mcalpineobrinka  Dimitrova M.D.   On: 10/09/2015 22:28    Assessment & Plan:   Claris CheMargaret was seen today for asthma.  Diagnoses and all orders for this visit:  Essential hypertension- her blood pressure is adequately well-controlled, I am concerned that her beta blocker therapy may be exacerbating her asthma so I have asked her to change from metoprolol to nebivolol for blood pressure control -     Basic metabolic panel; Future -     torsemide (DEMADEX) 10 MG tablet; Take 1 tablet (10 mg total) by mouth 2 (two) times daily. -     nebivolol (BYSTOLIC) 5 MG tablet; Take 1 tablet (5 mg total) by mouth daily.  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 -     CBC with Differential/Platelet; Future -     Ferritin; Future -     Folate; Future -     Vitamin B12; Future -     IBC panel; Future  Other specified hypothyroidism- her TSH is in the normal range, we'll continue the current dose of levothyroxine. -     TSH; Future -     levothyroxine (SYNTHROID, LEVOTHROID) 75 MCG tablet; Take 1 tablet (75 mcg total) by mouth daily.  Diastolic dysfunction- she has edema in her lower extremities so I have asked her to restart Demadex, will continue to control her blood pressure with the ARB and beta blocker. -     torsemide (DEMADEX) 10 MG tablet; Take 1 tablet (10 mg total) by mouth 2 (two) times daily. -     nebivolol (BYSTOLIC) 5 MG tablet; Take 1 tablet (5 mg total) by mouth daily.  Asthma, mild intermittent, with acute exacerbation- I'm concerned that metoprolol may have exacerbated her asthma so have changed to a more selective beta agonist with nebivolol,  I've also encouraged her to be more compliant with the Symbicort -     budesonide-formoterol (SYMBICORT) 160-4.5 MCG/ACT inhaler; Inhale 2 puffs into the lungs 2 (two) times daily.  Cognitive impairment -     Ambulatory referral to Neurology   I have discontinued Ms. [name]'s levothyroxine and metoprolol tartrate. I am also having her start on nebivolol and levothyroxine. Additionally, I am having her maintain her loratadine, sodium chloride, esomeprazole, montelukast, telmisartan, fluticasone, albuterol, dextromethorphan-guaiFENesin, torsemide, and budesonide-formoterol.  Meds ordered this encounter  Medications  . torsemide (DEMADEX) 10 MG tablet    Sig: Take 1 tablet (10 mg total) by mouth 2 (two)  times daily.    Dispense:  180 tablet    Refill:  3  . budesonide-formoterol (SYMBICORT) 160-4.5 MCG/ACT inhaler    Sig: Inhale 2 puffs into the lungs 2 (two) times daily.    Dispense:  1 Inhaler    Refill:  11  . nebivolol (BYSTOLIC) 5 MG tablet    Sig: Take 1 tablet (5 mg total) by mouth daily.    Dispense:  90 tablet    Refill:  3  . levothyroxine (SYNTHROID, LEVOTHROID) 75 MCG tablet    Sig: Take 1 tablet (75 mcg total) by mouth daily.    Dispense:  90 tablet    Refill:  1     Follow-up: Return in about 3 months (around 03/02/2016).  Sanda Linger, MD

## 2015-12-01 NOTE — Progress Notes (Signed)
Pre visit review using our clinic review tool, if applicable. No additional management support is needed unless otherwise documented below in the visit note. 

## 2016-02-05 ENCOUNTER — Ambulatory Visit: Payer: Medicare Other | Admitting: Neurology

## 2016-02-24 ENCOUNTER — Encounter: Payer: Self-pay | Admitting: Internal Medicine

## 2016-02-24 ENCOUNTER — Other Ambulatory Visit (INDEPENDENT_AMBULATORY_CARE_PROVIDER_SITE_OTHER): Payer: Medicare Other

## 2016-02-24 ENCOUNTER — Ambulatory Visit (INDEPENDENT_AMBULATORY_CARE_PROVIDER_SITE_OTHER): Payer: Medicare Other | Admitting: Internal Medicine

## 2016-02-24 VITALS — BP 160/80 | HR 80 | Temp 98.3°F | Resp 16 | Ht 68.0 in | Wt 220.0 lb

## 2016-02-24 DIAGNOSIS — D539 Nutritional anemia, unspecified: Secondary | ICD-10-CM

## 2016-02-24 DIAGNOSIS — E118 Type 2 diabetes mellitus with unspecified complications: Secondary | ICD-10-CM | POA: Diagnosis not present

## 2016-02-24 DIAGNOSIS — E039 Hypothyroidism, unspecified: Secondary | ICD-10-CM | POA: Diagnosis not present

## 2016-02-24 DIAGNOSIS — J4520 Mild intermittent asthma, uncomplicated: Secondary | ICD-10-CM

## 2016-02-24 DIAGNOSIS — R4189 Other symptoms and signs involving cognitive functions and awareness: Secondary | ICD-10-CM | POA: Diagnosis not present

## 2016-02-24 DIAGNOSIS — I248 Other forms of acute ischemic heart disease: Secondary | ICD-10-CM

## 2016-02-24 DIAGNOSIS — Z23 Encounter for immunization: Secondary | ICD-10-CM

## 2016-02-24 DIAGNOSIS — I519 Heart disease, unspecified: Secondary | ICD-10-CM

## 2016-02-24 DIAGNOSIS — I5189 Other ill-defined heart diseases: Secondary | ICD-10-CM

## 2016-02-24 LAB — BASIC METABOLIC PANEL
BUN: 19 mg/dL (ref 6–23)
CO2: 29 mEq/L (ref 19–32)
CREATININE: 0.83 mg/dL (ref 0.40–1.20)
Calcium: 9.8 mg/dL (ref 8.4–10.5)
Chloride: 103 mEq/L (ref 96–112)
GFR: 70.76 mL/min (ref >60.00)
GLUCOSE: 103 mg/dL — AB (ref 70–99)
POTASSIUM: 3.9 meq/L (ref 3.5–5.1)
Sodium: 140 mEq/L (ref 135–145)

## 2016-02-24 LAB — CBC WITH DIFFERENTIAL/PLATELET
BASOS PCT: 0.2 % (ref 0.0–3.0)
Basophils Absolute: 0 10*3/uL (ref 0.0–0.1)
EOS ABS: 0.5 10*3/uL (ref 0.0–0.7)
EOS PCT: 5.1 % — AB (ref 0.0–5.0)
HEMATOCRIT: 37.2 % (ref 36.0–46.0)
HEMOGLOBIN: 12.6 g/dL (ref 12.0–15.0)
LYMPHS PCT: 19.4 % (ref 12.0–46.0)
Lymphs Abs: 1.8 10*3/uL (ref 0.7–4.0)
MCHC: 34 g/dL (ref 30.0–36.0)
MCV: 88.1 fl (ref 78.0–100.0)
MONOS PCT: 6 % (ref 3.0–12.0)
Monocytes Absolute: 0.6 10*3/uL (ref 0.1–1.0)
NEUTROS ABS: 6.5 10*3/uL (ref 1.4–7.7)
Neutrophils Relative %: 69.3 % (ref 43.0–77.0)
Platelets: 324 10*3/uL (ref 150.0–400.0)
RBC: 4.22 Mil/uL (ref 3.87–5.11)
RDW: 15.1 % (ref 11.5–15.5)
WBC: 9.4 10*3/uL (ref 4.0–10.5)

## 2016-02-24 LAB — HEMOGLOBIN A1C: Hgb A1c MFr Bld: 5.7 % (ref 4.6–6.5)

## 2016-02-24 LAB — TSH: TSH: 4.78 u[IU]/mL — ABNORMAL HIGH (ref 0.35–4.50)

## 2016-02-24 MED ORDER — MONTELUKAST SODIUM 10 MG PO TABS: 10.0000 mg | ORAL_TABLET | Freq: Every day | ORAL | 3 refills | Status: AC

## 2016-02-24 MED ORDER — BUDESONIDE-FORMOTEROL FUMARATE 160-4.5 MCG/ACT IN AERO: 2.0000 | INHALATION_SPRAY | Freq: Two times a day (BID) | RESPIRATORY_TRACT | 11 refills | Status: DC

## 2016-02-24 MED ORDER — ALBUTEROL SULFATE HFA 108 (90 BASE) MCG/ACT IN AERS: 2.0000 | INHALATION_SPRAY | Freq: Four times a day (QID) | RESPIRATORY_TRACT | 5 refills | Status: DC | PRN

## 2016-02-24 NOTE — Progress Notes (Signed)
Subjective:  Patient ID: [name] [name]    DOB: Aug 10, 1940  Age: 75 y.o. MRN: [id number]  CC: Hypertension; Asthma; and Hypothyroidism   HPI [name] [name] presents for follow-up. Her son is with her and is concerned that she is not taking her medications or using her inhaler. He tells me that she continues to have cognitive decline. The last time I saw her I referred her to neurology but she decided not to be seen. She complains of fatigue, wheezing, baseline level of dyspnea on exertion and edema.  Outpatient Medications Prior to Visit  Medication Sig Dispense Refill  . levothyroxine (SYNTHROID, LEVOTHROID) 75 MCG tablet Take 1 tablet (75 mcg total) by mouth daily. 90 tablet 1  . nebivolol (BYSTOLIC) 5 MG tablet Take 1 tablet (5 mg total) by mouth daily. 90 tablet 3  . telmisartan (MICARDIS) 80 MG tablet Take 1 tablet (80 mg total) by mouth daily. 90 tablet 3  . torsemide (DEMADEX) 10 MG tablet Take 1 tablet (10 mg total) by mouth 2 (two) times daily. 180 tablet 3  . dextromethorphan-guaiFENesin (MUCINEX DM) 30-600 MG 12hr tablet Take 1 tablet by mouth 2 (two) times daily. 30 tablet 0  . sodium chloride (OCEAN) 0.65 % SOLN nasal spray Place 2 sprays into both nostrils 4 (four) times daily. (Patient taking differently: Place 2 sprays into both nostrils 2 (two) times daily. ) 90 mL 0  . esomeprazole (NEXIUM) 40 MG capsule Take 1 capsule (40 mg total) by mouth 2 (two) times daily before a meal. 180 capsule 3  . fluticasone (FLONASE) 50 MCG/ACT nasal spray Place 2 sprays into both nostrils daily. 48 g 1  . loratadine (CLARITIN) 10 MG tablet Take 10 mg by mouth daily.    Marland Kitchen. albuterol (PROAIR HFA) 108 (90 Base) MCG/ACT inhaler Inhale 2 puffs into the lungs every 6 (six) hours as needed for wheezing or shortness of breath. (Patient not taking: Reported on 02/24/2016) 8.5 each 5  . budesonide-formoterol (SYMBICORT) 160-4.5 MCG/ACT inhaler Inhale 2 puffs into the lungs 2 (two)  times daily. (Patient not taking: Reported on 02/24/2016) 1 Inhaler 11  . montelukast (SINGULAIR) 10 MG tablet Take 1 tablet (10 mg total) by mouth at bedtime. (Patient not taking: Reported on 02/24/2016) 90 tablet 3   No facility-administered medications prior to visit.     ROS Review of Systems  Constitutional: Positive for fatigue. Negative for activity change, appetite change, chills, diaphoresis, fever and unexpected weight change.  HENT: Negative.  Negative for sore throat and trouble swallowing.   Eyes: Negative.  Negative for visual disturbance.  Respiratory: Positive for shortness of breath and wheezing. Negative for cough, choking, chest tightness and stridor.   Cardiovascular: Negative.  Negative for chest pain, palpitations and leg swelling.  Gastrointestinal: Negative for abdominal pain, blood in stool, constipation, diarrhea, nausea and vomiting.  Endocrine: Negative.   Genitourinary: Negative.  Negative for difficulty urinating.  Musculoskeletal: Negative.  Negative for arthralgias, back pain, joint swelling, myalgias and neck pain.  Skin: Negative.  Negative for color change and rash.  Allergic/Immunologic: Negative.   Neurological: Negative.  Negative for dizziness.  Hematological: Negative for adenopathy. Does not bruise/bleed easily.  Psychiatric/Behavioral: Positive for confusion and decreased concentration. Negative for self-injury, sleep disturbance and suicidal ideas. The patient is not nervous/anxious.     Objective:  BP (!) 160/80 (BP Location: Left Arm, Patient Position: Sitting, Cuff Size: Large)   Pulse 80   Temp 98.3 F (36.8 C) (Oral)  Resp 16   Ht 5\' 8"  (1.727 m)   Wt 220 lb (99.8 kg)   LMP 12/01/2010   SpO2 95%   BMI 33.45 kg/m   BP Readings from Last 3 Encounters:  02/24/16 (!) 160/80  12/01/15 132/72  11/04/15 114/60    Wt Readings from Last 3 Encounters:  02/24/16 220 lb (99.8 kg)  12/01/15 217 lb 8 oz (98.7 kg)  11/04/15 211 lb 12.8 oz  (96.1 kg)    Physical Exam  Constitutional: She is oriented to person, place, and time. No distress.  HENT:  Mouth/Throat: Oropharynx is clear and moist. No oropharyngeal exudate.  Eyes: Conjunctivae are normal. Right eye exhibits no discharge. Left eye exhibits no discharge. No scleral icterus.  Neck: Normal range of motion. Neck supple. No JVD present. No tracheal deviation present. No thyromegaly present.  Cardiovascular: Normal rate, regular rhythm and intact distal pulses.  Exam reveals no gallop and no friction rub.   No murmur heard. Pulmonary/Chest: Effort normal and breath sounds normal. No stridor. No respiratory distress. She has no wheezes. She has no rales. She exhibits no tenderness.  Abdominal: Soft. Bowel sounds are normal. She exhibits no distension and no mass. There is no tenderness. There is no rebound and no guarding.  Musculoskeletal: Normal range of motion. She exhibits no edema, tenderness or deformity.  Lymphadenopathy:    She has no cervical adenopathy.  Neurological: She is oriented to person, place, and time.  Skin: Skin is warm and dry. No rash noted. She is not diaphoretic. No erythema. No pallor.  Vitals reviewed.   Lab Results  Component Value Date   WBC 9.4 02/24/2016   HGB 12.6 02/24/2016   HCT 37.2 02/24/2016   PLT 324.0 02/24/2016   GLUCOSE 103 (H) 02/24/2016   CHOL 162 10/12/2015   TRIG 136 10/12/2015   HDL 51 10/12/2015   LDLCALC 84 10/12/2015   ALT 25 10/09/2015   AST 36 10/09/2015   NA 140 02/24/2016   K 3.9 02/24/2016   CL 103 02/24/2016   CREATININE 0.83 02/24/2016   BUN 19 02/24/2016   CO2 29 02/24/2016   TSH 4.78 (H) 02/24/2016   INR 1.23 10/09/2015   HGBA1C 5.7 02/24/2016   MICROALBUR 0.9 07/08/2015    Ct Head Wo Contrast  Result Date: 10/09/2015 CLINICAL DATA:  Nonresponsive, respiratory distress. EXAM: CT HEAD WITHOUT CONTRAST TECHNIQUE: Contiguous axial images were obtained from the base of the skull through the vertex  without intravenous contrast. COMPARISON:  None. FINDINGS: Mild right maxillary sinusitis is noted. Minimal chronic ischemic white matter disease is noted. No mass effect or midline shift is noted. Ventricular size is within normal limits. There is no evidence of mass lesion, hemorrhage or acute infarction. IMPRESSION: Mild right maxillary sinusitis. Minimal chronic ischemic white matter disease. No acute intracranial abnormality seen. Electronically Signed   By: Lupita Raider, M.D.   On: 10/09/2015 16:14   Dg Chest Portable 1 View  Result Date: 10/09/2015 CLINICAL DATA:  Respiratory distress. EXAM: PORTABLE CHEST 1 VIEW COMPARISON:  Radiograph of March 30, 2015. FINDINGS: The heart size and mediastinal contours are within normal limits. No pneumothorax or pleural effusion is noted. Endotracheal tube is seen projected over tracheal air shadow with distal tip 2.5 cm above the carina. Both lungs are clear. The visualized skeletal structures are unremarkable. IMPRESSION: Endotracheal tube in grossly good position. No acute cardiopulmonary abnormality seen. Electronically Signed   By: Lupita Raider, M.D.   On: 10/09/2015  15:48   Dg Abd Portable 1v  Result Date: 10/09/2015 CLINICAL DATA:  Orogastric tube placement. EXAM: PORTABLE ABDOMEN - 1 VIEW COMPARISON:  None. FINDINGS: Enteric catheter descends from the thorax to the central abdomen in a straight line. Lack of gastric bubble, and somewhat unusual course of the catheter make confirmation of positioning difficult. The bowel gas pattern is nonobstructive. No radio-opaque calculi or other significant radiographic abnormality are seen. IMPRESSION: Enteric catheter descends from the thorax to the central abdomen in a straight line. Although it may be within the gastric body, radiographic confirmation is difficult due to its somewhat unusual course and lack of gastric bubble. Electronically Signed   By: Ted Mcalpine M.D.   On: 10/09/2015 22:28     Assessment & Plan:   Kaelynn was seen today for hypertension, asthma and hypothyroidism.  Diagnoses and all orders for this visit:  Cognitive impairment -     Ambulatory referral to Home Health  Mild intermittent asthma without complication- her symptoms are not well controlled but she is also not compliant with Symbicort, I've asked her to restart Symbicort and Singulair and will have home health care evaluate her -     budesonide-formoterol (SYMBICORT) 160-4.5 MCG/ACT inhaler; Inhale 2 puffs into the lungs 2 (two) times daily. -     montelukast (SINGULAIR) 10 MG tablet; Take 1 tablet (10 mg total) by mouth at bedtime. -     albuterol (PROAIR HFA) 108 (90 Base) MCG/ACT inhaler; Inhale 2 puffs into the lungs every 6 (six) hours as needed for wheezing or shortness of breath. -     Ambulatory referral to Home Health  Hypothyroidism, unspecified type- her TSH is slightly elevated but at her age I do not think I should raise the dose, will continue the current dose for now -     TSH; Future -     Ambulatory referral to Home Health  Deficiency anemia- improvement noted -     CBC with Differential/Platelet; Future -     Ambulatory referral to Home Health  Type 2 diabetes mellitus with complication, without long-term current use of insulin (HCC)- her blood sugars are well-controlled -     Basic metabolic panel; Future -     Hemoglobin A1c; Future -     Ambulatory referral to Home Health  Diastolic dysfunction- she has a normal volume status, will continue the diuretic and beta blocker as well as ARB for blood pressure control. -     Ambulatory referral to Home Health  Need for prophylactic vaccination and inoculation against influenza -     Flu Vaccine QUAD 36+ mos IM   I have discontinued Ms. [name]'s sodium chloride and dextromethorphan-guaiFENesin. I am also having her maintain her loratadine, esomeprazole, telmisartan, fluticasone, torsemide, nebivolol, levothyroxine,  budesonide-formoterol, montelukast, and albuterol.  Meds ordered this encounter  Medications  . budesonide-formoterol (SYMBICORT) 160-4.5 MCG/ACT inhaler    Sig: Inhale 2 puffs into the lungs 2 (two) times daily.    Dispense:  1 Inhaler    Refill:  11  . montelukast (SINGULAIR) 10 MG tablet    Sig: Take 1 tablet (10 mg total) by mouth at bedtime.    Dispense:  90 tablet    Refill:  3  . albuterol (PROAIR HFA) 108 (90 Base) MCG/ACT inhaler    Sig: Inhale 2 puffs into the lungs every 6 (six) hours as needed for wheezing or shortness of breath.    Dispense:  8.5 each    Refill:  5     Follow-up: Return in about 6 months (around 08/23/2016).  Sanda Lingerhomas Randale Carvalho, MD

## 2016-02-24 NOTE — Progress Notes (Signed)
Pre visit review using our clinic review tool, if applicable. No additional management support is needed unless otherwise documented below in the visit note. 

## 2016-02-24 NOTE — Patient Instructions (Signed)

## 2016-02-29 NOTE — Telephone Encounter (Signed)
-----   Message from Sheron NightingaleJason Pierce sent at 02/29/2016 11:32 AM EST ----- Got it!  Thank you! ----- Message ----- From: Eulah PontHelen E Aheron Sent: 02/29/2016  11:01 AM To: Melissa Stenson  Please see [facility] referral

## 2016-03-07 DIAGNOSIS — J449 Chronic obstructive pulmonary disease, unspecified: Secondary | ICD-10-CM | POA: Diagnosis not present

## 2016-03-07 DIAGNOSIS — I110 Hypertensive heart disease with heart failure: Secondary | ICD-10-CM | POA: Diagnosis not present

## 2016-03-07 DIAGNOSIS — E119 Type 2 diabetes mellitus without complications: Secondary | ICD-10-CM | POA: Diagnosis not present

## 2016-03-07 DIAGNOSIS — I482 Chronic atrial fibrillation: Secondary | ICD-10-CM | POA: Diagnosis not present

## 2016-03-07 DIAGNOSIS — F329 Major depressive disorder, single episode, unspecified: Secondary | ICD-10-CM | POA: Diagnosis not present

## 2016-03-07 DIAGNOSIS — J4520 Mild intermittent asthma, uncomplicated: Secondary | ICD-10-CM | POA: Diagnosis not present

## 2016-03-07 DIAGNOSIS — F0390 Unspecified dementia without behavioral disturbance: Secondary | ICD-10-CM | POA: Diagnosis not present

## 2016-03-07 DIAGNOSIS — E785 Hyperlipidemia, unspecified: Secondary | ICD-10-CM | POA: Diagnosis not present

## 2016-03-07 DIAGNOSIS — K219 Gastro-esophageal reflux disease without esophagitis: Secondary | ICD-10-CM | POA: Diagnosis not present

## 2016-03-07 DIAGNOSIS — E039 Hypothyroidism, unspecified: Secondary | ICD-10-CM | POA: Diagnosis not present

## 2016-03-07 DIAGNOSIS — D649 Anemia, unspecified: Secondary | ICD-10-CM | POA: Diagnosis not present

## 2016-03-07 DIAGNOSIS — I519 Heart disease, unspecified: Secondary | ICD-10-CM | POA: Diagnosis not present

## 2016-03-10 ENCOUNTER — Emergency Department (HOSPITAL_COMMUNITY): Payer: Medicare Other

## 2016-03-10 ENCOUNTER — Encounter (HOSPITAL_COMMUNITY): Payer: Self-pay

## 2016-03-10 ENCOUNTER — Inpatient Hospital Stay (HOSPITAL_COMMUNITY)
Admission: EM | Admit: 2016-03-10 | Discharge: 2016-03-13 | DRG: 194 | Disposition: A | Discharge status: Skilled Nursing Facility | Payer: Medicare Other | Attending: Family Medicine | Admitting: Family Medicine

## 2016-03-10 DIAGNOSIS — I5032 Chronic diastolic (congestive) heart failure: Secondary | ICD-10-CM | POA: Diagnosis present

## 2016-03-10 DIAGNOSIS — F329 Major depressive disorder, single episode, unspecified: Secondary | ICD-10-CM | POA: Diagnosis present

## 2016-03-10 DIAGNOSIS — Z818 Family history of other mental and behavioral disorders: Secondary | ICD-10-CM | POA: Diagnosis not present

## 2016-03-10 DIAGNOSIS — R410 Disorientation, unspecified: Secondary | ICD-10-CM | POA: Diagnosis not present

## 2016-03-10 DIAGNOSIS — M6281 Muscle weakness (generalized): Secondary | ICD-10-CM | POA: Diagnosis not present

## 2016-03-10 DIAGNOSIS — J4520 Mild intermittent asthma, uncomplicated: Secondary | ICD-10-CM | POA: Diagnosis not present

## 2016-03-10 DIAGNOSIS — R4182 Altered mental status, unspecified: Secondary | ICD-10-CM | POA: Diagnosis not present

## 2016-03-10 DIAGNOSIS — Z96651 Presence of right artificial knee joint: Secondary | ICD-10-CM | POA: Diagnosis present

## 2016-03-10 DIAGNOSIS — R41841 Cognitive communication deficit: Secondary | ICD-10-CM | POA: Diagnosis not present

## 2016-03-10 DIAGNOSIS — R259 Unspecified abnormal involuntary movements: Secondary | ICD-10-CM | POA: Diagnosis not present

## 2016-03-10 DIAGNOSIS — Z7951 Long term (current) use of inhaled steroids: Secondary | ICD-10-CM

## 2016-03-10 DIAGNOSIS — R05 Cough: Secondary | ICD-10-CM | POA: Diagnosis not present

## 2016-03-10 DIAGNOSIS — E039 Hypothyroidism, unspecified: Secondary | ICD-10-CM | POA: Diagnosis present

## 2016-03-10 DIAGNOSIS — Z833 Family history of diabetes mellitus: Secondary | ICD-10-CM | POA: Diagnosis not present

## 2016-03-10 DIAGNOSIS — R296 Repeated falls: Secondary | ICD-10-CM | POA: Diagnosis not present

## 2016-03-10 DIAGNOSIS — J4521 Mild intermittent asthma with (acute) exacerbation: Secondary | ICD-10-CM | POA: Diagnosis not present

## 2016-03-10 DIAGNOSIS — J189 Pneumonia, unspecified organism: Secondary | ICD-10-CM | POA: Diagnosis present

## 2016-03-10 DIAGNOSIS — E785 Hyperlipidemia, unspecified: Secondary | ICD-10-CM | POA: Diagnosis present

## 2016-03-10 DIAGNOSIS — Z7901 Long term (current) use of anticoagulants: Secondary | ICD-10-CM | POA: Diagnosis not present

## 2016-03-10 DIAGNOSIS — Z79899 Other long term (current) drug therapy: Secondary | ICD-10-CM

## 2016-03-10 DIAGNOSIS — J449 Chronic obstructive pulmonary disease, unspecified: Secondary | ICD-10-CM | POA: Diagnosis not present

## 2016-03-10 DIAGNOSIS — Z882 Allergy status to sulfonamides status: Secondary | ICD-10-CM | POA: Diagnosis not present

## 2016-03-10 DIAGNOSIS — Z880 Allergy status to penicillin: Secondary | ICD-10-CM | POA: Diagnosis not present

## 2016-03-10 DIAGNOSIS — I10 Essential (primary) hypertension: Secondary | ICD-10-CM | POA: Diagnosis not present

## 2016-03-10 DIAGNOSIS — Z888 Allergy status to other drugs, medicaments and biological substances status: Secondary | ICD-10-CM | POA: Diagnosis not present

## 2016-03-10 DIAGNOSIS — K219 Gastro-esophageal reflux disease without esophagitis: Secondary | ICD-10-CM | POA: Diagnosis present

## 2016-03-10 DIAGNOSIS — I110 Hypertensive heart disease with heart failure: Secondary | ICD-10-CM | POA: Diagnosis present

## 2016-03-10 DIAGNOSIS — R4189 Other symptoms and signs involving cognitive functions and awareness: Secondary | ICD-10-CM | POA: Diagnosis not present

## 2016-03-10 DIAGNOSIS — F331 Major depressive disorder, recurrent, moderate: Secondary | ICD-10-CM | POA: Diagnosis not present

## 2016-03-10 DIAGNOSIS — G3184 Mild cognitive impairment, so stated: Secondary | ICD-10-CM | POA: Diagnosis not present

## 2016-03-10 DIAGNOSIS — F0150 Vascular dementia without behavioral disturbance: Secondary | ICD-10-CM | POA: Diagnosis present

## 2016-03-10 DIAGNOSIS — R278 Other lack of coordination: Secondary | ICD-10-CM | POA: Diagnosis not present

## 2016-03-10 DIAGNOSIS — R4181 Age-related cognitive decline: Secondary | ICD-10-CM | POA: Diagnosis not present

## 2016-03-10 DIAGNOSIS — I480 Paroxysmal atrial fibrillation: Secondary | ICD-10-CM | POA: Diagnosis present

## 2016-03-10 DIAGNOSIS — Z8249 Family history of ischemic heart disease and other diseases of the circulatory system: Secondary | ICD-10-CM | POA: Diagnosis not present

## 2016-03-10 DIAGNOSIS — J181 Lobar pneumonia, unspecified organism: Secondary | ICD-10-CM | POA: Diagnosis not present

## 2016-03-10 DIAGNOSIS — F32A Depression, unspecified: Secondary | ICD-10-CM | POA: Diagnosis present

## 2016-03-10 DIAGNOSIS — Z8489 Family history of other specified conditions: Secondary | ICD-10-CM | POA: Diagnosis not present

## 2016-03-10 DIAGNOSIS — R0602 Shortness of breath: Secondary | ICD-10-CM | POA: Diagnosis not present

## 2016-03-10 HISTORY — DX: Repeated falls: R29.6

## 2016-03-10 LAB — URINE MICROSCOPIC-ADD ON

## 2016-03-10 LAB — URINALYSIS, ROUTINE W REFLEX MICROSCOPIC
Bilirubin Urine: NEGATIVE
Glucose, UA: NEGATIVE mg/dL
Ketones, ur: 15 mg/dL — AB
Leukocytes, UA: NEGATIVE
Nitrite: NEGATIVE
Protein, ur: NEGATIVE mg/dL
Specific Gravity, Urine: 1.02 (ref 1.005–1.030)
pH: 5.5 (ref 5.0–8.0)

## 2016-03-10 LAB — CBC WITH DIFFERENTIAL/PLATELET
Basophils Absolute: 0 10*3/uL (ref 0.0–0.1)
Basophils Relative: 0 %
Eosinophils Absolute: 0 10*3/uL (ref 0.0–0.7)
Eosinophils Relative: 0 %
HCT: 40.2 % (ref 36.0–46.0)
Hemoglobin: 13.6 g/dL (ref 12.0–15.0)
Lymphocytes Relative: 6 %
Lymphs Abs: 0.8 10*3/uL (ref 0.7–4.0)
MCH: 30.1 pg (ref 26.0–34.0)
MCHC: 33.8 g/dL (ref 30.0–36.0)
MCV: 88.9 fL (ref 78.0–100.0)
Monocytes Absolute: 0.9 10*3/uL (ref 0.1–1.0)
Monocytes Relative: 7 %
Neutro Abs: 10.3 10*3/uL — ABNORMAL HIGH (ref 1.7–7.7)
Neutrophils Relative %: 87 %
Platelets: 295 10*3/uL (ref 150–400)
RBC: 4.52 MIL/uL (ref 3.87–5.11)
RDW: 14.4 % (ref 11.5–15.5)
WBC: 11.9 10*3/uL — ABNORMAL HIGH (ref 4.0–10.5)

## 2016-03-10 LAB — COMPREHENSIVE METABOLIC PANEL
ALT: 25 U/L (ref 14–54)
AST: 24 U/L (ref 15–41)
Albumin: 3.9 g/dL (ref 3.5–5.0)
Alkaline Phosphatase: 79 U/L (ref 38–126)
Anion gap: 11 (ref 5–15)
BUN: 17 mg/dL (ref 6–20)
CO2: 26 mmol/L (ref 22–32)
Calcium: 9.3 mg/dL (ref 8.9–10.3)
Chloride: 102 mmol/L (ref 101–111)
Creatinine, Ser: 0.86 mg/dL (ref 0.44–1.00)
GFR calc Af Amer: >60 mL/min (ref >60)
GFR calc non Af Amer: >60 mL/min (ref >60)
Glucose, Bld: 143 mg/dL — ABNORMAL HIGH (ref 65–99)
Potassium: 3.5 mmol/L (ref 3.5–5.1)
Sodium: 139 mmol/L (ref 135–145)
Total Bilirubin: 0.9 mg/dL (ref 0.3–1.2)
Total Protein: 7.6 g/dL (ref 6.5–8.1)

## 2016-03-10 MED ORDER — MOMETASONE FURO-FORMOTEROL FUM 200-5 MCG/ACT IN AERO
2.0000 | INHALATION_SPRAY | Freq: Two times a day (BID) | RESPIRATORY_TRACT | Status: DC
  Administered 2016-03-10 – 2016-03-13 (×5): 2 via RESPIRATORY_TRACT
  Filled 2016-03-10: qty 8.8

## 2016-03-10 MED ORDER — PANTOPRAZOLE SODIUM 40 MG PO TBEC
40.0000 mg | DELAYED_RELEASE_TABLET | Freq: Every day | ORAL | Status: DC
  Administered 2016-03-10 – 2016-03-13 (×4): 40 mg via ORAL
  Filled 2016-03-10 (×4): qty 1

## 2016-03-10 MED ORDER — MONTELUKAST SODIUM 10 MG PO TABS
10.0000 mg | ORAL_TABLET | Freq: Every day | ORAL | Status: DC
  Administered 2016-03-10 – 2016-03-12 (×3): 10 mg via ORAL
  Filled 2016-03-10 (×3): qty 1

## 2016-03-10 MED ORDER — CALCIUM CARBONATE ANTACID 500 MG PO CHEW
1.0000 | CHEWABLE_TABLET | ORAL | Status: DC | PRN
  Administered 2016-03-13 (×2): 200 mg via ORAL
  Filled 2016-03-10 (×3): qty 1

## 2016-03-10 MED ORDER — FLUTICASONE PROPIONATE 50 MCG/ACT NA SUSP
2.0000 | Freq: Two times a day (BID) | NASAL | Status: DC
  Administered 2016-03-10 – 2016-03-13 (×5): 2 via NASAL
  Filled 2016-03-10: qty 16

## 2016-03-10 MED ORDER — LORATADINE 10 MG PO TABS
10.0000 mg | ORAL_TABLET | Freq: Every day | ORAL | Status: DC
  Administered 2016-03-10 – 2016-03-13 (×4): 10 mg via ORAL
  Filled 2016-03-10 (×4): qty 1

## 2016-03-10 MED ORDER — IPRATROPIUM-ALBUTEROL 0.5-2.5 (3) MG/3ML IN SOLN
3.0000 mL | Freq: Four times a day (QID) | RESPIRATORY_TRACT | Status: DC
  Administered 2016-03-10: 3 mL via RESPIRATORY_TRACT
  Filled 2016-03-10: qty 3

## 2016-03-10 MED ORDER — IRBESARTAN 300 MG PO TABS
300.0000 mg | ORAL_TABLET | Freq: Every day | ORAL | Status: DC
  Administered 2016-03-10 – 2016-03-13 (×4): 300 mg via ORAL
  Filled 2016-03-10 (×4): qty 1

## 2016-03-10 MED ORDER — LEVOTHYROXINE SODIUM 75 MCG PO TABS
75.0000 ug | ORAL_TABLET | Freq: Every day | ORAL | Status: DC
  Administered 2016-03-11 – 2016-03-13 (×3): 75 ug via ORAL
  Filled 2016-03-10 (×3): qty 1

## 2016-03-10 MED ORDER — DEXTROSE 5 % IV SOLN
1.0000 g | INTRAVENOUS | Status: DC
  Administered 2016-03-11 – 2016-03-12 (×2): 1 g via INTRAVENOUS
  Filled 2016-03-10 (×3): qty 10

## 2016-03-10 MED ORDER — IPRATROPIUM-ALBUTEROL 0.5-2.5 (3) MG/3ML IN SOLN
3.0000 mL | Freq: Three times a day (TID) | RESPIRATORY_TRACT | Status: DC
  Administered 2016-03-11 – 2016-03-13 (×4): 3 mL via RESPIRATORY_TRACT
  Filled 2016-03-10 (×7): qty 3

## 2016-03-10 MED ORDER — DEXTROSE 5 % IV SOLN
500.0000 mg | INTRAVENOUS | Status: DC
  Administered 2016-03-11 – 2016-03-12 (×2): 500 mg via INTRAVENOUS
  Filled 2016-03-10 (×2): qty 500

## 2016-03-10 MED ORDER — SODIUM CHLORIDE 0.9 % IV SOLN: Freq: Once | INTRAVENOUS | Status: DC

## 2016-03-10 MED ORDER — ALBUTEROL SULFATE (2.5 MG/3ML) 0.083% IN NEBU: 2.5000 mg | INHALATION_SOLUTION | RESPIRATORY_TRACT | Status: DC | PRN

## 2016-03-10 MED ORDER — TORSEMIDE 10 MG PO TABS
10.0000 mg | ORAL_TABLET | Freq: Two times a day (BID) | ORAL | Status: DC
  Administered 2016-03-10 – 2016-03-13 (×7): 10 mg via ORAL
  Filled 2016-03-10 (×7): qty 1

## 2016-03-10 MED ORDER — SODIUM CHLORIDE 0.9 % IV SOLN
INTRAVENOUS | Status: DC
  Administered 2016-03-11: 13:00:00 via INTRAVENOUS

## 2016-03-10 MED ORDER — DEXTROSE 5 % IV SOLN
500.0000 mg | Freq: Once | INTRAVENOUS | Status: AC
  Administered 2016-03-10: 500 mg via INTRAVENOUS
  Filled 2016-03-10: qty 500

## 2016-03-10 MED ORDER — ENOXAPARIN SODIUM 40 MG/0.4ML ~~LOC~~ SOLN
40.0000 mg | SUBCUTANEOUS | Status: DC
  Administered 2016-03-10 – 2016-03-12 (×3): 40 mg via SUBCUTANEOUS
  Filled 2016-03-10 (×3): qty 0.4

## 2016-03-10 MED ORDER — DEXTROSE 5 % IV SOLN
1.0000 g | Freq: Once | INTRAVENOUS | Status: AC
  Administered 2016-03-10: 1 g via INTRAVENOUS
  Filled 2016-03-10: qty 10

## 2016-03-10 MED ORDER — NEBIVOLOL HCL 5 MG PO TABS
5.0000 mg | ORAL_TABLET | Freq: Every day | ORAL | Status: DC
  Administered 2016-03-10 – 2016-03-13 (×4): 5 mg via ORAL
  Filled 2016-03-10 (×4): qty 1

## 2016-03-10 NOTE — ED Triage Notes (Signed)
Per EMS- Patient resides at home. Patient's son was visiting his mother and called EMS for Confusion. Patient was alert and  Oriented, but slow to respond to questins asked. Ptient was attempting to sit on the bed, but slid off. Patient did not hit her head or c/o of any pain afterwards. EMS also reports urine with a foul smell, but patient denies any frequency or dysuria.

## 2016-03-10 NOTE — ED Notes (Signed)
Patient transported to X-ray 

## 2016-03-10 NOTE — Progress Notes (Signed)
CSW spoke with patient at bedside with son, Ed Cimler present. Patient reports she was getting up to use the bathroom and her shoes had slippery soles. Patient reports she lives alone and her son is her main support. Patient reports having a walker and cane and patient's son stated patient just obtained walker in the last twenty-four hours. Patient reports she lives alone, however, the son states he goes to the home five to six times weekly to ensure patient has food and any other needs. He also stated he always ensure that he provides foods that are easy fix for patient. He stated patient has had drastic reduction in her mobility in the past two days. He stated patient is experiencing more fatigue and it will be more complicated for her to complete her own ADLs. No questions noted for CSW at this time.   [name] [name], LCSWA Clincial Social Worker [phone] 2:08 PM

## 2016-03-10 NOTE — ED Notes (Signed)
Bed: WA17 Expected date:  Expected time:  Means of arrival:  Comments: 4F Fall/?AMS

## 2016-03-10 NOTE — ED Notes (Signed)
No IV pump available at this time. ED secretary called portable for an IV pump.

## 2016-03-10 NOTE — ED Provider Notes (Signed)
[facility]-EMERGENCY DEPT Provider Note   CSN: [id number] Arrival date & time: 03/10/16  1021     History   Chief Complaint Chief Complaint  Patient presents with  . Fall  . Altered Mental Status    HPI [name] [name] is a 75 y.o. female.  HPI    75 year old female presents today with her son reports altered mental status. He reports that over the last several days patient has been altered, with difficulty in her memory. She reports she had 2 recent falls, lifting injuries from the falls. He notes she has had a minor cough but attributes this to the asthma. She denies any urinary complaints, denies any other infectious etiology. Patient is alert, but thinks it is 2016 in the month of October. She appears to be in no acute distress. Tolerating by mouth.  Past Medical History:  Diagnosis Date  . Allergic rhinitis   . Aortic insufficiency   . Asthma   . Chronic anticoagulation    on coumadin  . Colon polyps   . Depression   . Diastolic dysfunction   . GERD (gastroesophageal reflux disease)   . HLD (hyperlipidemia)   . HTN (hypertension)   . Hypothyroidism   . Migraine, unspecified, without mention of intractable migraine without mention of status migrainosus   . Obesity   . Osteoarthritis   . PAF (paroxysmal atrial fibrillation) (HCC)    on coumadin and Cardiazem  . S/P cardiac cath 2003   no obstructive disease     Patient Active Problem List   Diagnosis Date Noted  . Pneumonia 03/10/2016  . Deficiency anemia 12/01/2015  . Cognitive impairment 12/01/2015  . PAF (paroxysmal atrial fibrillation) (HCC) 10/26/2015  . Chronic obstructive pulmonary disease (HCC)   . Non-seasonal allergic rhinitis due to pollen 03/23/2015  . Chronic anticoagulation 01/20/2015  . Upper airway cough syndrome 08/23/2013  . LPRD (laryngopharyngeal reflux disease) 08/23/2013  . Encounter for therapeutic drug monitoring 05/17/2013  . OAB (overactive bladder) 01/24/2012  . Diastolic  dysfunction 09/07/2011  . Other screening mammogram 09/01/2011  . Type II diabetes mellitus with manifestations (HCC) 05/04/2011  . Hyperlipidemia with target LDL less than 100 08/30/2007  . Hypothyroidism 12/06/2006  . DEPRESSION 12/06/2006  . Essential hypertension 12/06/2006  . Aortic valve disorder 12/06/2006  . Campath-induced atrial fibrillation 12/06/2006  . Allergic rhinitis 12/06/2006  . Asthma, mild intermittent 12/06/2006  . GERD 12/06/2006  . OSTEOARTHRITIS 12/06/2006    Past Surgical History:  Procedure Laterality Date  . ABDOMINAL HYSTERECTOMY    . APPENDECTOMY    . REPLACEMENT TOTAL KNEE Right   . TUBAL LIGATION    . US ECHOCARDIOGRAPHY  2007    OB History    No data available       Home Medications    Prior to Admission medications   Medication Sig Start Date End Date Taking? Authorizing Provider  albuterol (PROAIR HFA) 108 (90 Base) MCG/ACT inhaler Inhale 2 puffs into the lungs every 6 (six) hours as needed for wheezing or shortness of breath. 02/24/16  Yes Etta Grandchildhomas L Jones, MD  albuterol (PROVENTIL) (2.5 MG/3ML) 0.083% nebulizer solution Take 2.5 mg by nebulization every 6 (six) hours as needed for wheezing or shortness of breath.   Yes Historical Provider, MD  budesonide-formoterol (SYMBICORT) 160-4.5 MCG/ACT inhaler Inhale 2 puffs into the lungs 2 (two) times daily. 02/24/16  Yes Etta Grandchildhomas L Jones, MD  calcium carbonate (TUMS - DOSED IN MG ELEMENTAL CALCIUM) 500 MG chewable tablet Chew 1 tablet by  mouth as needed for indigestion or heartburn.   Yes Historical Provider, MD  esomeprazole (NEXIUM) 40 MG capsule Take 1 capsule (40 mg total) by mouth 2 (two) times daily before a meal. 07/08/15  Yes Etta Grandchildhomas L Jones, MD  fluticasone (FLONASE) 50 MCG/ACT nasal spray Place 2 sprays into both nostrils daily. Patient taking differently: Place 2 sprays into both nostrils 2 (two) times daily.  07/16/15  Yes Etta Grandchildhomas L Jones, MD  ibuprofen (ADVIL,MOTRIN) 200 MG tablet Take 400 mg  by mouth every 6 (six) hours as needed for fever, headache, mild pain, moderate pain or cramping.   Yes Historical Provider, MD  influenza vac recom quadrivalent (FLUBLOK) 0.5 ML injection Inject 0.5 mLs into the muscle once.   Yes Historical Provider, MD  levothyroxine (SYNTHROID, LEVOTHROID) 75 MCG tablet Take 1 tablet (75 mcg total) by mouth daily. 12/01/15  Yes Etta Grandchildhomas L Jones, MD  loratadine (CLARITIN) 10 MG tablet Take 10 mg by mouth daily.   Yes Historical Provider, MD  montelukast (SINGULAIR) 10 MG tablet Take 1 tablet (10 mg total) by mouth at bedtime. 02/24/16  Yes Etta Grandchildhomas L Jones, MD  nebivolol (BYSTOLIC) 5 MG tablet Take 1 tablet (5 mg total) by mouth daily. 12/01/15  Yes Etta Grandchildhomas L Jones, MD  telmisartan (MICARDIS) 80 MG tablet Take 1 tablet (80 mg total) by mouth daily. 07/08/15  Yes Etta Grandchildhomas L Jones, MD  torsemide (DEMADEX) 10 MG tablet Take 1 tablet (10 mg total) by mouth 2 (two) times daily. 12/01/15  Yes Etta Grandchildhomas L Jones, MD    Family History Family History  Problem Relation Age of Onset  . Heart disease    . Hypertension    . COPD    . Crohn's disease    . Diabetes    . Depression    . Dementia      Social History Social History  Substance Use Topics  . Smoking status: Never Smoker  . Smokeless tobacco: Never Used  . Alcohol use 3.0 oz/week    5 Glasses of wine per week     Allergies   Tetracycline; Acetaminophen; Cardizem [diltiazem hcl]; Cefaclor; Cephalexin; Clindamycin; Crestor [rosuvastatin]; Ibuprofen; Nsaids; Penicillins; Pravastatin sodium; Simvastatin; Sulfonamide derivatives; Levaquin [levofloxacin in d5w]; and Lisinopril   Review of Systems Review of Systems  All other systems reviewed and are negative.   Physical Exam Updated Vital Signs BP (!) 202/69   Pulse 74   Temp 97.5 F (36.4 C)   Resp 18   Ht 5\' 8"  (1.727 m)   Wt 99.8 kg   LMP 12/01/2010   SpO2 96%   BMI 33.45 kg/m   Physical Exam  Constitutional: She appears well-developed and  well-nourished.  HENT:  Head: Normocephalic and atraumatic.  Eyes: Conjunctivae are normal. Pupils are equal, round, and reactive to light. Right eye exhibits no discharge. Left eye exhibits no discharge. No scleral icterus.  Neck: Normal range of motion. No JVD present. No tracheal deviation present.  Cardiovascular: Normal rate.   Pulmonary/Chest: Effort normal and breath sounds normal. No stridor. No respiratory distress. She has no wheezes. She has no rales. She exhibits no tenderness.  Abdominal: Soft.  Neurological: She is alert. No cranial nerve deficit. Coordination normal.  Skin: Capillary refill takes less than 2 seconds.  Psychiatric: She has a normal mood and affect. Her behavior is normal. Judgment and thought content normal.  Nursing note and vitals reviewed.    ED Treatments / Results  Labs (all labs ordered are listed, but only  abnormal results are displayed) Labs Reviewed  CBC WITH DIFFERENTIAL/PLATELET - Abnormal; Notable for the following:       Result Value   WBC 11.9 (*)    Neutro Abs 10.3 (*)    All other components within normal limits  COMPREHENSIVE METABOLIC PANEL - Abnormal; Notable for the following:    Glucose, Bld 143 (*)    All other components within normal limits  URINALYSIS, ROUTINE W REFLEX MICROSCOPIC (NOT AT [facility]) - Abnormal; Notable for the following:    Hgb urine dipstick TRACE (*)    Ketones, ur 15 (*)    All other components within normal limits  URINE MICROSCOPIC-ADD ON - Abnormal; Notable for the following:    Squamous Epithelial / LPF 0-5 (*)    Bacteria, UA RARE (*)    All other components within normal limits    EKG  EKG Interpretation None       Radiology Dg Chest 2 View  Result Date: 03/10/2016 CLINICAL DATA:  Patient with cough, shortness of breath. EXAM: CHEST  2 VIEW COMPARISON:  Chest further 10/09/2015; 01/20/2015. FINDINGS: Normal cardiac and mediastinal contours. Focal consolidation within the left lower hemi  thorax. No pleural effusion. No pneumothorax. Mid thoracic spine degenerative changes. IMPRESSION: Focal consolidation left lower hemi thorax concerning for pneumonia in the appropriate clinical setting. Followup PA and lateral chest X-ray is recommended in 3-4 weeks following trial of antibiotic therapy to ensure resolution and exclude underlying malignancy. Electronically Signed   By: Annia Belt M.D.   On: 03/10/2016 12:30   Ct Head Wo Contrast  Result Date: 03/10/2016 CLINICAL DATA:  Altered mental status EXAM: CT HEAD WITHOUT CONTRAST TECHNIQUE: Contiguous axial images were obtained from the base of the skull through the vertex without intravenous contrast. COMPARISON:  CT 10/09/2015 FINDINGS: Brain: Generalized atrophy and ventricular enlargement unchanged. Chronic microvascular ischemic changes in the white matter stable. No acute infarct, mass, hemorrhage Vascular: No hyperdense vessel or unexpected calcification. Skull: Negative Sinuses/Orbits: Prior sinus surgery. Mucosal edema throughout the paranasal sinuses with bony thickening of the maxillary sinus bilaterally due to chronic sinusitis. Normal orbit. Other: None IMPRESSION: Atrophy and chronic microvascular ischemic changes stable. No acute abnormality. Electronically Signed   By: Marlan Palau M.D.   On: 03/10/2016 12:42    Procedures Procedures (including critical care time)  Medications Ordered in ED Medications  azithromycin (ZITHROMAX) 500 mg in dextrose 5 % 250 mL IVPB (500 mg Intravenous Transfusing/Transfer 03/10/16 1600)  0.9 %  sodium chloride infusion (not administered)  cefTRIAXone (ROCEPHIN) 1 g in dextrose 5 % 50 mL IVPB (0 g Intravenous Stopped 03/10/16 1556)     Initial Impression / Assessment and Plan / ED Course  I have reviewed the triage vital signs and the nursing notes.  Pertinent labs & imaging results that were available during my care of the patient were reviewed by me and considered in my medical  decision making (see chart for details).  Clinical Course      Final Clinical Impressions(s) / ED Diagnoses   Final diagnoses:  Community acquired pneumonia, unspecified laterality    Labs:CBC, CMP, urinalysis  Imaging: CT head without, DG chest  Consults: Pharmacy, [facility]  Therapeutics: Ceftriaxone, azithromycin  Discharge Meds:   Assessment/Plan:   75 year old female presents today with altered mental status. Patient does not appear acutely ill, she has very reassuring vital signs. Chest x-ray returned showing likely pneumonia. Due to patient's altered mental status, age, and history of significant pulmonary distress requiring  intubation is appropriate the patient be admitted for IV antibiotics until mental status improves. Pharmacy was consult regarding antibiotics, patient has had Ceftin in the past, Avelox, and agrees that ceftriaxone would be appropriate at this time. Patient be given antibiotics, hospitalist consult for admission who agreed to admit the patient.     New Prescriptions Current Discharge Medication List       Eyvonne Mechanic, PA-C 03/10/16 1618    Raeford Razor, MD 03/12/16 1118

## 2016-03-10 NOTE — H&P (Signed)
History and Physical:    [name] [name]   ZOX:[id number] DOB: 1940-08-17 DOA: 03/10/2016  Referring MD/provider: Eyvonne Mechanic, PA-C PCP: Sanda Linger, MD   Patient coming from: Home  Chief Complaint: AMS  History of Present Illness:   [name] [name] is an 75 y.o. female with a PMH of asthma, Atrial fibrillation, hypertension and depression who was brought to the hospital for evaluation for altered mental status by her son. According to the patient's son, he noticed problems with her short-term memory approximately 6 months ago. She was evaluated by her PCP who recommended a neurology workup, but the patient canceled the appointment and refused to go. She was subsequently seen by her PCP 7-10 days ago and was referred to neurology again with an appointment scheduled for sometime in January. Over the past week, the patient's son reports that she has become increasingly confused. She has had frequent falls and earlier today, the patient's son found her on the floor. She lives alone and called him telling her her husband was in the home (her husband has been deceased for the past year). The patient's son is unaware of any significant psychiatric history, but does report that when her husband died, he left her in bed financial shape due to a surreptitious gambling problem. This has been a source of significant stress for the patient. When I interview the patient, she seems distracted and does not seem to know why she is here in hospital. Although there have been some symptoms suggestive of a upper respiratory infection, she doesn't divulge this spontaneously but needs to be prompted to discuss her symptoms.  ED Course:  The patient was evaluated in the ED and a chest radiograph showed possible pneumonia. Urinalysis was negative. Because of her mental status changes, a CT of the head was obtained which showed generalized atrophy, and chronic microvascular ischemic changes which are  stable.  ROS:   Review of Systems  Constitutional: Positive for malaise/fatigue. Negative for fever.  HENT: Positive for sore throat.   Respiratory: Positive for cough, sputum production and shortness of breath.   Cardiovascular: Positive for chest pain.  Gastrointestinal: Positive for vomiting.  Genitourinary: Positive for frequency.  Musculoskeletal: Positive for myalgias.  Neurological: Positive for tremors. Negative for seizures, loss of consciousness and headaches.  Psychiatric/Behavioral: Positive for memory loss.  . Past Medical History:   Past Medical History:  Diagnosis Date  . Allergic rhinitis   . Aortic insufficiency   . Asthma   . Chronic anticoagulation    on coumadin  . Colon polyps   . Depression   . Diastolic dysfunction   . GERD (gastroesophageal reflux disease)   . HLD (hyperlipidemia)   . HTN (hypertension)   . Hypothyroidism   . Migraine, unspecified, without mention of intractable migraine without mention of status migrainosus   . Obesity   . Osteoarthritis   . PAF (paroxysmal atrial fibrillation) (HCC)    on coumadin and Cardiazem  . S/P cardiac cath 2003   no obstructive disease     Past Surgical History:   Past Surgical History:  Procedure Laterality Date  . ABDOMINAL HYSTERECTOMY    . APPENDECTOMY    . REPLACEMENT TOTAL KNEE Right   . TUBAL LIGATION    . US ECHOCARDIOGRAPHY  2007    Social History:   Social History   Social History  . Marital status: Widowed    Spouse name: N/A  . Number of children: 3  . Years of  education: N/A   Occupational History  . Retired Charity fundraiser    Social History Main Topics  . Smoking status: Never Smoker  . Smokeless tobacco: Never Used  . Alcohol use 3.0 oz/week    5 Glasses of wine per week  . Drug use: No  . Sexual activity: Not Currently   Other Topics Concern  . Not on file   Social History Narrative   Lives alone. Mostly sedentary. Doesn't drive. Son does all the cooking and iADLs.     Allergies   Tetracycline; Acetaminophen; Cardizem [diltiazem hcl]; Cefaclor; Cephalexin; Clindamycin; Crestor [rosuvastatin]; Ibuprofen; Nsaids; Penicillins; Pravastatin sodium; Simvastatin; Sulfonamide derivatives; Levaquin [levofloxacin in d5w]; and Lisinopril  Family history:   Family History  Problem Relation Age of Onset  . Heart disease    . Hypertension    . COPD    . Crohn's disease    . Diabetes    . Depression    . Dementia      Current Medications:   Prior to Admission medications   Medication Sig Start Date End Date Taking? Authorizing Provider  albuterol (PROAIR HFA) 108 (90 Base) MCG/ACT inhaler Inhale 2 puffs into the lungs every 6 (six) hours as needed for wheezing or shortness of breath. 02/24/16  Yes Etta Grandchild, MD  albuterol (PROVENTIL) (2.5 MG/3ML) 0.083% nebulizer solution Take 2.5 mg by nebulization every 6 (six) hours as needed for wheezing or shortness of breath.   Yes Historical Provider, MD  budesonide-formoterol (SYMBICORT) 160-4.5 MCG/ACT inhaler Inhale 2 puffs into the lungs 2 (two) times daily. 02/24/16  Yes Etta Grandchild, MD  calcium carbonate (TUMS - DOSED IN MG ELEMENTAL CALCIUM) 500 MG chewable tablet Chew 1 tablet by mouth as needed for indigestion or heartburn.   Yes Historical Provider, MD  esomeprazole (NEXIUM) 40 MG capsule Take 1 capsule (40 mg total) by mouth 2 (two) times daily before a meal. 07/08/15  Yes Etta Grandchild, MD  fluticasone (FLONASE) 50 MCG/ACT nasal spray Place 2 sprays into both nostrils daily. Patient taking differently: Place 2 sprays into both nostrils 2 (two) times daily.  07/16/15  Yes Etta Grandchild, MD  ibuprofen (ADVIL,MOTRIN) 200 MG tablet Take 400 mg by mouth every 6 (six) hours as needed for fever, headache, mild pain, moderate pain or cramping.   Yes Historical Provider, MD  influenza vac recom quadrivalent (FLUBLOK) 0.5 ML injection Inject 0.5 mLs into the muscle once.   Yes Historical Provider, MD   levothyroxine (SYNTHROID, LEVOTHROID) 75 MCG tablet Take 1 tablet (75 mcg total) by mouth daily. 12/01/15  Yes Etta Grandchild, MD  loratadine (CLARITIN) 10 MG tablet Take 10 mg by mouth daily.   Yes Historical Provider, MD  montelukast (SINGULAIR) 10 MG tablet Take 1 tablet (10 mg total) by mouth at bedtime. 02/24/16  Yes Etta Grandchild, MD  nebivolol (BYSTOLIC) 5 MG tablet Take 1 tablet (5 mg total) by mouth daily. 12/01/15  Yes Etta Grandchild, MD  telmisartan (MICARDIS) 80 MG tablet Take 1 tablet (80 mg total) by mouth daily. 07/08/15  Yes Etta Grandchild, MD  torsemide (DEMADEX) 10 MG tablet Take 1 tablet (10 mg total) by mouth 2 (two) times daily. 12/01/15  Yes Etta Grandchild, MD    Physical Exam:   Vitals:   03/10/16 1439 03/10/16 1530 03/10/16 1600 03/10/16 1708  BP: 155/77 186/92 (!) 202/69 (!) 182/87  Pulse: 72 73 74 73  Resp: 18   18  Temp:  99 F (37.2 C)  TempSrc:    Oral  SpO2: 97% 95% 96% 100%  Weight:      Height:         Physical Exam: Blood pressure (!) 182/87, pulse 73, temperature 99 F (37.2 C), temperature source Oral, resp. rate 18, height 5\' 8"  (1.727 m), weight 99.8 kg (220 lb), last menstrual period 12/01/2010, SpO2 100 %. Gen: No acute distress. Head: Normocephalic, atraumatic. Eyes: Pupils equal, round and reactive to light. Extraocular movements intact.  Sclerae nonicteric. No lid lag. Mouth: Oropharynx reveals Dry mucous membranes. Dentition is fair. Neck: Supple, no thyromegaly, no lymphadenopathy, no jugular venous distention. Chest: Lungs are diminished bilaterally with faint wheezing/rhonchi on the left. No rales. CV: Heart sounds are regular with an S1, S2. No murmurs, rubs, clicks, or gallops.  Abdomen: Soft, nontender, nondistended with normal active bowel sounds. No hepatosplenomegaly or palpable masses. Extremities: Extremities are without clubbing, or cyanosis. Pedal pulses 2+. 2+ edema bilaterally lower extremities. Skin: Warm and dry. No rashes,  lesions or wounds. Neuro: Alert and oriented times 2 (unaware of date or month); grossly nonfocal. Tongue midline. No pronator drift. Able to perform 2 step commands, but does so slowly. Unable to spell world backwards. Psych: Insight and judgment are impaired. Short-term memory/recall impaired. Mood and affect depressed. Appears distracted and unable to concentrate.   Data Review:    Labs: Basic Metabolic Panel:  Recent Labs Lab 03/10/16 1206  NA 139  K 3.5  CL 102  CO2 26  GLUCOSE 143*  BUN 17  CREATININE 0.86  CALCIUM 9.3   Liver Function Tests:  Recent Labs Lab 03/10/16 1206  AST 24  ALT 25  ALKPHOS 79  BILITOT 0.9  PROT 7.6  ALBUMIN 3.9   No results for input(s): LIPASE, AMYLASE in the last 168 hours. No results for input(s): AMMONIA in the last 168 hours. CBC:  Recent Labs Lab 03/10/16 1206  WBC 11.9*  NEUTROABS 10.3*  HGB 13.6  HCT 40.2  MCV 88.9  PLT 295   Cardiac Enzymes: No results for input(s): CKTOTAL, CKMB, CKMBINDEX, TROPONINI in the last 168 hours.  BNP (last 3 results)  Recent Labs  03/23/15 1350  PROBNP 78.0   CBG: No results for input(s): GLUCAP in the last 168 hours.  Urinalysis    Component Value Date/Time   COLORURINE YELLOW 03/10/2016 1200   APPEARANCEUR CLEAR 03/10/2016 1200   LABSPEC 1.020 03/10/2016 1200   PHURINE 5.5 03/10/2016 1200   GLUCOSEU NEGATIVE 03/10/2016 1200   GLUCOSEU NEGATIVE 07/08/2015 1123   HGBUR TRACE (A) 03/10/2016 1200   BILIRUBINUR NEGATIVE 03/10/2016 1200   KETONESUR 15 (A) 03/10/2016 1200   PROTEINUR NEGATIVE 03/10/2016 1200   UROBILINOGEN 0.2 07/08/2015 1123   NITRITE NEGATIVE 03/10/2016 1200   LEUKOCYTESUR NEGATIVE 03/10/2016 1200      Radiographic Studies: Dg Chest 2 View  Result Date: 03/10/2016 CLINICAL DATA:  Patient with cough, shortness of breath. EXAM: CHEST  2 VIEW COMPARISON:  Chest further 10/09/2015; 01/20/2015. FINDINGS: Normal cardiac and mediastinal contours. Focal  consolidation within the left lower hemi thorax. No pleural effusion. No pneumothorax. Mid thoracic spine degenerative changes. IMPRESSION: Focal consolidation left lower hemi thorax concerning for pneumonia in the appropriate clinical setting. Followup PA and lateral chest X-ray is recommended in 3-4 weeks following trial of antibiotic therapy to ensure resolution and exclude underlying malignancy. Electronically Signed   By: Annia Beltrew  Davis M.D.   On: 03/10/2016 12:30   Ct Head Wo Contrast  Result Date: 03/10/2016 CLINICAL DATA:  Altered mental status EXAM: CT HEAD WITHOUT CONTRAST TECHNIQUE: Contiguous axial images were obtained from the base of the skull through the vertex without intravenous contrast. COMPARISON:  CT 10/09/2015 FINDINGS: Brain: Generalized atrophy and ventricular enlargement unchanged. Chronic microvascular ischemic changes in the white matter stable. No acute infarct, mass, hemorrhage Vascular: No hyperdense vessel or unexpected calcification. Skull: Negative Sinuses/Orbits: Prior sinus surgery. Mucosal edema throughout the paranasal sinuses with bony thickening of the maxillary sinus bilaterally due to chronic sinusitis. Normal orbit. Other: None IMPRESSION: Atrophy and chronic microvascular ischemic changes stable. No acute abnormality. Electronically Signed   By: Marlan Palau M.D.   On: 03/10/2016 12:42    EKG: None performed.   Assessment/Plan:   Principal Problem:   CAP (community acquired pneumonia) With altered mental status and mild asthma exacerbation Unclear if the patient's mental status changes are secondary to an acute infection or if there is something else going on. CT of the head was negative. Left lower lobe pneumonia noted on chest radiography. Check sputum cultures, blood culture, strep pneumonia antigen and HIV. Treat with Rocephin/azithromycin. We'll place on DuoNeb nebs every 6 hours with albuterol every 2 hours as needed. We'll try to avoid steroids for now  given mental status changes and risk of exacerbation of this. She has a faint wheeze and is not hypoxic at present. IV antibiotics indicated given her reports of recent vomiting episodes.  Active Problems:   Hypothyroidism Continue Synthroid.    Depression Suspect she will need a formal psychiatric evaluation for symptoms do not clear with treatment of her infection.    Asthma, mild intermittent Continue Symbicort and DuoNeb's. Albuterol every 2 hours as needed.    Cognitive impairment May have dementia given findings of cerebrovascular disease on imaging. Suspect she has an underlying depression with psychosis given her recent psychosocial stressors.    Frequent falls We'll need PT/OT evaluations.   Attestation regarding necessity of inpatient status:   The appropriate admission status for this patient is INPATIENT. Inpatient status is judged to be reasonable and necessary in order to provide the required intensity of service to ensure the patient's safety. The patient's presenting symptoms, physical exam findings, and initial radiographic and laboratory data in the context of their chronic comorbidities is felt to place them at high risk for further clinical deterioration. Furthermore, it is not anticipated that the patient will be medically stable for discharge from the hospital within 2 midnights of admission. The following factors support the admission status of inpatient.    The patient's presenting symptoms include Shortness of breath, cough, nausea, vomiting, altered mental status, frequent falls.  The worrisome physical exam findings include mental status changes and poor insight/concentration with depressed affect and mild expiratory wheezing.  The initial radiographic and laboratory data are worrisome because of pneumonia findings on chest radiography, cerebrovascular disease on CT scan.  The chronic co-morbidities include hypothyroidism, asthma.   * I certify that at the  point of admission it is my clinical judgment that the patient will require inpatient hospital care spanning beyond 2 midnights from the point of admission due to high intensity of service, high risk for further deterioration and high frequency of surveillance required.*    Other information:   DVT prophylaxis: Lovenox ordered. Code Status: Full code. Family Communication: Son by telephone.  Disposition Plan: We'll likely need SNF placement. Consults called: None. Admission status: Inpatient.  The medical decision making on this patient was of high complexity and  the patient is at high risk for clinical deterioration, therefore this is a level 3 visit.   Rad Gramling [facility] Pager [phone] Cell: [phone]   If 7PM-7AM, please contact night-coverage [email or web address] Password [email or web address] 03/10/2016, 5:13 PM

## 2016-03-11 ENCOUNTER — Telehealth: Payer: Self-pay

## 2016-03-11 DIAGNOSIS — R4189 Other symptoms and signs involving cognitive functions and awareness: Secondary | ICD-10-CM

## 2016-03-11 DIAGNOSIS — J4521 Mild intermittent asthma with (acute) exacerbation: Secondary | ICD-10-CM

## 2016-03-11 DIAGNOSIS — J181 Lobar pneumonia, unspecified organism: Secondary | ICD-10-CM

## 2016-03-11 DIAGNOSIS — E039 Hypothyroidism, unspecified: Secondary | ICD-10-CM

## 2016-03-11 LAB — STREP PNEUMONIAE URINARY ANTIGEN: Strep Pneumo Urinary Antigen: NEGATIVE

## 2016-03-11 LAB — HIV ANTIBODY (ROUTINE TESTING W REFLEX): HIV SCREEN 4TH GENERATION: NONREACTIVE

## 2016-03-11 NOTE — Clinical Social Work Note (Signed)
Clinical Social Work Assessment  Patient Details  Name: [name] [name] MRN: [id number] Date of Birth: 08-Dec-1940  Date of referral:  03/11/16               Reason for consult:  Facility Placement                Permission sought to share information with:  Facility Sport and exercise psychologist, Family Supports Permission granted to share information::     Name::     ED  Agency::  SNF  Relationship::  Son  Contact Information:  [phone]  Housing/Transportation Living arrangements for the past 2 months:  Single Family Home Source of Information:  Adult Children, Patient Patient Interpreter Needed:  None Criminal Activity/Legal Involvement Pertinent to Current Situation/Hospitalization:  No - Comment as needed Significant Relationships:  Adult Children Lives with:  Self Do you feel safe going back to the place where you live?  No Need for family participation in patient care:  Yes (Comment)  Care giving concerns:  Patient son Ed expressed concerns about patient ability to care for self at home alone. He reports the patient has been confused and forgetful. He reports at times the patient believes her deceased husband is living. He reports she has been under a lot of stress and is worried about her cognitive. He reports patient has an appointment  with neurologist in January.    He reports concerns with patient taking respiratory steroids, he feels patient does well physically with steroids and when she off the steroids she no longer has same strength. LCSWA encouraged son to express concerns with patient physician and nurse. He wants patient to get home health services as well.     Social Worker assessment / plan:  LCSWA met pt. And son at bedside and explained reason for consult. Patient and son are agreeable to skill nursing for rehab. Patient has been to [facility] in the past and family is pleased with care and services. They are agreeable to return. The patient will need 3  night qualifying stay for insurance to cover cost.   LCSWA will assist with patient disposition to SNF.   Employment status:  Retired Forensic scientist:  Medicare PT Recommendations:  [place] / Referral to community resources:  [place]  Patient/Family's Response to care:  Agreeable to care but wants to discuss steroid use.   Patient/Family's Understanding of and Emotional Response to Diagnosis, Current Treatment, and Prognosis:  Patient son wants understanding about patient condition and is hopeful to meet with physician about plan and to get questions answered.   Emotional Assessment Appearance:  Appears stated age Attitude/Demeanor/Rapport:    Affect (typically observed):  Calm, Accepting, Pleasant Orientation:  Oriented to Place, Oriented to Self, Oriented to Situation Alcohol / Substance use:  Not Applicable Psych involvement (Current and /or in the community):  No (Comment)  Discharge Needs  Concerns to be addressed:  Discharge Planning Concerns, Care Coordination Readmission within the last 30 days:  Yes Current discharge risk:  Dependent with Mobility Barriers to Discharge:  Continued Medical Work up   [employer], LCSW 03/11/2016, 1:35 PM

## 2016-03-11 NOTE — Progress Notes (Signed)
PROGRESS NOTE  Jackelyn PolingMargaret M Lozier-Arps  ZOX:[id number] DOB: [birth year] DOA: 03/10/2016 PCP: Sanda Lingerhomas Jones, MD   Brief Narrative: Lajuana CarryMargaret M Lozier-Arps is an 75 y.o. female with a PMH of asthma, AFib, HTN, and depression brought to ED on 11/16 for altered mental status by her son. According to the patient's son, he noticed problems with her short-term memory approximately 6 months ago. She was evaluated by her PCP who recommended a neurology workup, but the patient canceled the appointment and refused to go. She was referred to neurology again with an appointment scheduled in January. Over the past week, the patient's son reports that she has become increasingly confused. She has had frequent falls and earlier today, the patient's son found her on the floor. She lives alone and called him telling her her husband was in the home (her husband has been deceased for the past year). The patient's son is unaware of any significant psychiatric history, but she has had depression following the death of her first husband. He left her in bad financial straits due to a surreptitious gambling problem. This has been a source of significant stress for the patient.   When I interview the patient, she seems distracted and does not seem to know why she is here in hospital. The patient was evaluated in the ED and a chest radiograph showed possible pneumonia. Urinalysis was negative. Because of her mental status changes, a CT of the head was obtained which showed generalized atrophy, and chronic microvascular ischemic changes which are stable.  Assessment & Plan: Principal Problem:   CAP (community acquired pneumonia) Active Problems:   Hypothyroidism   Depression   Asthma, mild intermittent   Altered mental status   Cognitive impairment   Frequent falls  CAP with mild asthma exacerbation:  - Left lower lobe pneumonia noted on chest radiography.  - Check sputum cultures, blood culture, strep pneumonia antigen and  HIV.  - Treat with Rocephin/azithromycin IV given history of emesis. Transition to oral as able once reliably taking po. - Continue symbicort - DuoNeb nebs every 6 hours with albuterol every 2 hours as needed. - Avoiding steroids given benign exam findings and risk of steroid psychosis.   AMS: Cognitive impairment appears subacute and now worsened with acute illness. Possibly early vascular dementia with CT of the head was negative for acute event, but chronic cerebrovascular disease was evident.   Hypothyroidism: TSH checked at admission, slightly elevated at 4.78. Doubt this is significantly  - Continue Synthroid at current dose - Recommend recheck TSH in 6 weeks  Depression: - Will ask for formal psychiatric evaluation for symptoms. Cognitive impairment may be related to this, i.e. pseudodementia. Concern at admission included for psychosis due to hallucinations of dead husband.  Frequent falls - PT/OT recommending SNF.  DVT prophylaxis: Lovenox Code Status: Full Family Communication: Son contacted this AM Disposition Plan: SNF once medically stable and following psychiatric evaluation.  Consultants:   Psychiatry  Procedures:   None  Antimicrobials:  Rocephin (11/16 >> )  Azithromycin (11/16 >> )   Subjective: Patient still having some trouble breathing when you explicitly ask her. Otherwise, seems too tangential to ask open-ended questions. No pressured speech, denies hallucinations.   Objective: Vitals:   03/11/16 0930 03/11/16 1023 03/11/16 1245 03/11/16 1332  BP:  (!) 170/75 (!) 160/126 123/89  Pulse:  70 97 (!) 103  Resp:  18  18  Temp:  98.7 F (37.1 C)  98.8 F (37.1 C)  TempSrc:  Oral  Oral  SpO2: 93% 96%  92%  Weight:      Height:        Intake/Output Summary (Last 24 hours) at 03/11/16 1737 Last data filed at 03/11/16 0820  Gross per 24 hour  Intake              360 ml  Output                1 ml  Net              359 ml   Filed Weights    03/10/16 1035  Weight: 99.8 kg (220 lb)    Examination: General exam: 75 y.o. female in no distress Respiratory system: Non-labored breathing 2L [place]. Diminished globally but clear without crackles or wheezes. Cardiovascular system: Regular rate and rhythm. No murmur, rub, or gallop. No JVD, and no pedal edema. Gastrointestinal system: Abdomen soft, non-tender, non-distended, with normoactive bowel sounds. No organomegaly or masses felt. Central nervous system: Alert and oriented. No focal neurological deficits. Extremities: Warm, no deformities Skin: No rashes, lesions no ulcers Psychiatry: Judgement and insight appear normal. Mood & affect appropriate.   Data Reviewed: I have personally reviewed following labs and imaging studies  CBC:  Recent Labs Lab 03/10/16 1206  WBC 11.9*  NEUTROABS 10.3*  HGB 13.6  HCT 40.2  MCV 88.9  PLT 295   Basic Metabolic Panel:  Recent Labs Lab 03/10/16 1206  NA 139  K 3.5  CL 102  CO2 26  GLUCOSE 143*  BUN 17  CREATININE 0.86  CALCIUM 9.3   GFR: Estimated Creatinine Clearance: 67.7 mL/min (by C-G formula based on SCr of 0.86 mg/dL). Liver Function Tests:  Recent Labs Lab 03/10/16 1206  AST 24  ALT 25  ALKPHOS 79  BILITOT 0.9  PROT 7.6  ALBUMIN 3.9   No results for input(s): LIPASE, AMYLASE in the last 168 hours. No results for input(s): AMMONIA in the last 168 hours. Coagulation Profile: No results for input(s): INR, PROTIME in the last 168 hours. Cardiac Enzymes: No results for input(s): CKTOTAL, CKMB, CKMBINDEX, TROPONINI in the last 168 hours. BNP (last 3 results)  Recent Labs  03/23/15 1350  PROBNP 78.0   HbA1C: No results for input(s): HGBA1C in the last 72 hours. CBG: No results for input(s): GLUCAP in the last 168 hours. Lipid Profile: No results for input(s): CHOL, HDL, LDLCALC, TRIG, CHOLHDL, LDLDIRECT in the last 72 hours. Thyroid Function Tests: No results for input(s): TSH, T4TOTAL, FREET4, T3FREE,  THYROIDAB in the last 72 hours. Anemia Panel: No results for input(s): VITAMINB12, FOLATE, FERRITIN, TIBC, IRON, RETICCTPCT in the last 72 hours. Urine analysis:    Component Value Date/Time   COLORURINE YELLOW 03/10/2016 1200   APPEARANCEUR CLEAR 03/10/2016 1200   LABSPEC 1.020 03/10/2016 1200   PHURINE 5.5 03/10/2016 1200   GLUCOSEU NEGATIVE 03/10/2016 1200   GLUCOSEU NEGATIVE 07/08/2015 1123   HGBUR TRACE (A) 03/10/2016 1200   BILIRUBINUR NEGATIVE 03/10/2016 1200   KETONESUR 15 (A) 03/10/2016 1200   PROTEINUR NEGATIVE 03/10/2016 1200   UROBILINOGEN 0.2 07/08/2015 1123   NITRITE NEGATIVE 03/10/2016 1200   LEUKOCYTESUR NEGATIVE 03/10/2016 1200   Sepsis Labs: @LABRCNTIP (procalcitonin:4,lacticidven:4)  ) Recent Results (from the past 240 hour(s))  Culture, blood (routine x 2) Call MD if unable to obtain prior to antibiotics being given     Status: None (Preliminary result)   Collection Time: 03/10/16  6:37 PM  Result Value Ref Range Status   Specimen Description  BLOOD RIGHT ARM  Final   Special Requests BOTTLES DRAWN AEROBIC AND ANAEROBIC 5CC  Final   Culture   Final    NO GROWTH < 24 HOURS Performed at [facility]    Report Status PENDING  Incomplete  Culture, blood (routine x 2) Call MD if unable to obtain prior to antibiotics being given     Status: None (Preliminary result)   Collection Time: 03/10/16  6:37 PM  Result Value Ref Range Status   Specimen Description BLOOD RIGHT ARM  Final   Special Requests IN PEDIATRIC BOTTLE 3CC  Final   Culture   Final    NO GROWTH < 24 HOURS Performed at [facility]    Report Status PENDING  Incomplete     Radiology Studies: Dg Chest 2 View  Result Date: 03/10/2016 CLINICAL DATA:  Patient with cough, shortness of breath. EXAM: CHEST  2 VIEW COMPARISON:  Chest further 10/09/2015; 01/20/2015. FINDINGS: Normal cardiac and mediastinal contours. Focal consolidation within the left lower hemi thorax. No pleural  effusion. No pneumothorax. Mid thoracic spine degenerative changes. IMPRESSION: Focal consolidation left lower hemi thorax concerning for pneumonia in the appropriate clinical setting. Followup PA and lateral chest X-ray is recommended in 3-4 weeks following trial of antibiotic therapy to ensure resolution and exclude underlying malignancy. Electronically Signed   By: Annia Belt M.D.   On: 03/10/2016 12:30   Ct Head Wo Contrast  Result Date: 03/10/2016 CLINICAL DATA:  Altered mental status EXAM: CT HEAD WITHOUT CONTRAST TECHNIQUE: Contiguous axial images were obtained from the base of the skull through the vertex without intravenous contrast. COMPARISON:  CT 10/09/2015 FINDINGS: Brain: Generalized atrophy and ventricular enlargement unchanged. Chronic microvascular ischemic changes in the white matter stable. No acute infarct, mass, hemorrhage Vascular: No hyperdense vessel or unexpected calcification. Skull: Negative Sinuses/Orbits: Prior sinus surgery. Mucosal edema throughout the paranasal sinuses with bony thickening of the maxillary sinus bilaterally due to chronic sinusitis. Normal orbit. Other: None IMPRESSION: Atrophy and chronic microvascular ischemic changes stable. No acute abnormality. Electronically Signed   By: Marlan Palau M.D.   On: 03/10/2016 12:42    Scheduled Meds: . azithromycin  500 mg Intravenous Q24H  . cefTRIAXone (ROCEPHIN)  IV  1 g Intravenous Q24H  . enoxaparin (LOVENOX) injection  40 mg Subcutaneous Q24H  . fluticasone  2 spray Each Nare BID  . ipratropium-albuterol  3 mL Nebulization TID  . irbesartan  300 mg Oral Daily  . levothyroxine  75 mcg Oral QAC breakfast  . loratadine  10 mg Oral Daily  . mometasone-formoterol  2 puff Inhalation BID  . montelukast  10 mg Oral QHS  . nebivolol  5 mg Oral Daily  . pantoprazole  40 mg Oral Daily  . torsemide  10 mg Oral BID   Continuous Infusions: . sodium chloride 75 mL/hr at 03/11/16 1242     LOS: 1 day   Time  spent: 25 minutes.  Hazeline Junker, MD [facility] Pager [phone]  If 7PM-7AM, please contact night-coverage [email or web address] Password [facility] 03/11/2016, 5:37 PM

## 2016-03-11 NOTE — Telephone Encounter (Signed)
[name] with PT called and stated that she was not able to reach pt at home yesterday as scheduled.   Called PT and informed that pt is currently admitted for CAP.  Yvonna AlanisKaitlyn will try pt again next week. PT order may change due to recent CAP.

## 2016-03-11 NOTE — Evaluation (Signed)
Physical Therapy Evaluation Patient Details Name: [name] [name] MRN: [id number] DOB: March 12, 1941 Today's Date: 03/11/2016   History of Present Illness  [name] [name] is an 75 y.o. female with a PMH of asthma, Atrial fibrillation, hypertension and depression who was brought to the hospital for evaluation for altered mental status by her son.   Clinical Impression  The patient is easily distracted and requires much extra time to follow through with mobility. She appears to be able to ambulate with assistance but is self limiting and not understanding the need to do so. The son was present at end of session and reports multiple falls. Pt admitted with above diagnosis. Pt currently with functional limitations due to the deficits listed below (see PT Problem List).  Pt will benefit from skilled PT to increase their independence and safety with mobility to allow discharge to the venue listed below.       Follow Up Recommendations SNF;Supervision/Assistance - 24 hour    Equipment Recommendations  None recommended by PT    Recommendations for Other Services       Precautions / Restrictions Precautions Precautions: Fall      Mobility  Bed Mobility Overal bed mobility: Needs Assistance Bed Mobility: Supine to Sit     Supine to sit: Min assist     General bed mobility comments: much extra time to initiate and follow through with  moving to edgfe of bed. Assisted with the legs  Transfers Overall transfer level: Needs assistance Equipment used: Rolling walker (2 wheeled) Transfers: Sit to/from [employer] CorporationStand;Stand Pivot Transfers Sit to Stand: Mod assist         General transfer comment: extra time to stand from bed, patient easily distracted by multiple people coming into room. Multimodal cues for standinng and pivoting to [facility].  Ambulation/Gait Ambulation/Gait assistance: Min assist Ambulation Distance (Feet): 6 Feet Assistive device: Rolling walker (2 wheeled) Gait  Pattern/deviations: Step-to pattern     General Gait Details: extra time to walk around to get to recliner. the patient was distracted and did not wish to ambulate.  Stairs            Wheelchair Mobility    Modified Rankin (Stroke Patients Only)       Balance Overall balance assessment: Needs assistance;History of Falls Sitting-balance support: Feet supported;Bilateral upper extremity supported Sitting balance-Leahy Scale: Fair     Standing balance support: During functional activity;Bilateral upper extremity supported Standing balance-Leahy Scale: Poor                               Pertinent Vitals/Pain Pain Assessment: Faces Faces Pain Scale: Hurts little more Pain Location: right kne Pain Descriptors / Indicators: Tender Pain Intervention(s): Monitored during session    Home Living Family/patient expects to be discharged to:: Private residence Living Arrangements: Alone Available Help at Discharge: Family Type of Home: House Home Access: Stairs to enter   Secretary/administratorntrance Stairs-Number of Steps: 1 Home Layout: Full bath on main level;Two level Home Equipment: Walker - 4 wheels;Cane - single point;Shower seat      Prior Function Level of Independence: Independent               Hand Dominance        Extremity/Trunk Assessment   Upper Extremity Assessment: Generalized weakness           Lower Extremity Assessment: Generalized weakness      Cervical / Trunk Assessment: Normal  Communication  Communication: No difficulties  Cognition Arousal/Alertness: Awake/alert Behavior During Therapy: WFL for tasks assessed/performed Overall Cognitive Status: Impaired/Different from baseline Area of Impairment: Orientation;Memory;Following commands;Awareness;Problem solving Orientation Level: Time;Situation;Place   Memory: Decreased short-term memory Following Commands: Follows one step commands inconsistently   Awareness: Emergent Problem  Solving: Slow processing;Decreased initiation General Comments: patient took an extended  time to get to sitting up at the bed edge with min assistance.     General Comments      Exercises     Assessment/Plan    PT Assessment Patient needs continued PT services  PT Problem List Decreased strength;Decreased activity tolerance;Decreased mobility;Decreased cognition;Decreased knowledge of use of DME;Decreased safety awareness;Decreased knowledge of precautions;Decreased balance          PT Treatment Interventions DME instruction;Gait training;Functional mobility training;Therapeutic activities;Therapeutic exercise;Patient/family education    PT Goals (Current goals can be found in the Care Plan section)  Acute Rehab PT Goals Patient Stated Goal: agreed to get up.  PT Goal Formulation: With family Time For Goal Achievement: 03/25/16 Potential to Achieve Goals: Good    Frequency Min 3X/week   Barriers to discharge Decreased caregiver support      Co-evaluation               End of Session   Activity Tolerance: Patient tolerated treatment well Patient left: in chair;with call bell/phone within reach;with chair alarm set;with family/visitor present Nurse Communication: Mobility status         Time: 1121-1150 PT Time Calculation (min) (ACUTE ONLY): 29 min   Charges:   PT Evaluation $PT Eval Low Complexity: 1 Procedure PT Treatments $Therapeutic Activity: 8-22 mins   PT G Codes:        Rada HayHill, Olander Friedl Elizabeth 03/11/2016, 1:17 PM Blanchard KelchKaren Addy Mcmannis PT [phone]

## 2016-03-11 NOTE — NC FL2 (Signed)
[place] MEDICAID FL2 LEVEL OF CARE SCREENING TOOL     IDENTIFICATION  Patient Name: [name] [name] Birthdate: 12/28/1940 Sex: female Admission Date (Current Location): 03/10/2016  [facility] [place]   [facility] and Address[facility]  [facility],  [address], [place] [id number]      Provider Number: [id number]  Attending Physician Name and Address:  Tyrone Nineyan B Grunz, MD  Relative Name and Phone Number:       Current Level of Care: SNF Recommended Level of Care: Skilled Nursing Facility Prior Approval Number:    Date Approved/Denied:   PASRR Number:  [id number] [id number]   Discharge Plan:      Current Diagnoses: Patient Active Problem List   Diagnosis Date Noted  . Frequent falls 03/10/2016  . CAP (community acquired pneumonia) 03/10/2016  . Deficiency anemia 12/01/2015  . Cognitive impairment 12/01/2015  . PAF (paroxysmal atrial fibrillation) (HCC) 10/26/2015  . Chronic obstructive pulmonary disease (HCC)   . Altered mental status   . Non-seasonal allergic rhinitis due to pollen 03/23/2015  . Chronic anticoagulation 01/20/2015  . Upper airway cough syndrome 08/23/2013  . LPRD (laryngopharyngeal reflux disease) 08/23/2013  . Encounter for therapeutic drug monitoring 05/17/2013  . OAB (overactive bladder) 01/24/2012  . Diastolic dysfunction 09/07/2011  . Other screening mammogram 09/01/2011  . Type II diabetes mellitus with manifestations (HCC) 05/04/2011  . Hyperlipidemia with target LDL less than 100 08/30/2007  . Hypothyroidism 12/06/2006  . Depression 12/06/2006  . Essential hypertension 12/06/2006  . Aortic valve disorder 12/06/2006  . [place]ath-induced atrial fibrillation 12/06/2006  . Allergic rhinitis 12/06/2006  . Asthma, mild intermittent 12/06/2006  . GERD 12/06/2006  . OSTEOARTHRITIS 12/06/2006    Orientation RESPIRATION BLADDER Height & Weight     Self, Situation, Place  Normal Continent Weight: 220 lb (99.8  kg) Height:  5\' 8"  (172.7 cm)  BEHAVIORAL SYMPTOMS/MOOD NEUROLOGICAL BOWEL NUTRITION STATUS      Continent Diet  AMBULATORY STATUS COMMUNICATION OF NEEDS Skin   Extensive Assist Verbally Normal                       Personal Care Assistance Level of Assistance  Bathing, Feeding, Dressing Bathing Assistance: Limited assistance Feeding assistance: Independent Dressing Assistance: Limited assistance     Functional Limitations Info  Sight, Speech, Hearing Sight Info: Adequate Hearing Info: Adequate Speech Info: Adequate    SPECIAL CARE FACTORS FREQUENCY  PT (By licensed PT), OT (By licensed OT)     PT Frequency: 5 OT Frequency: 5            Contractures Contractures Info: Not present    Additional Factors Info  Code Status, Allergies Code Status Info: Full Code Allergies Info: Tetracycline, Acetaminophen, Cardizem Diltiazem Hcl, Cefaclor, Cephalexin, Clindamycin, Crestor Rosuvastatin, Ibuprofen, Nsaids, Penicillins, Pravastatin Sodium, Simvastatin, Sulfonamide Derivatives, Levaquin Levofloxacin In D5w, Lisinopril           Current Medications (03/11/2016):  This is the current hospital active medication list Current Facility-Administered Medications  Medication Dose Route Frequency Provider Last Rate Last Dose  . 0.9 %  sodium chloride infusion   Intravenous Continuous Maryruth Bunhristina P Rama, MD 75 mL/hr at 03/11/16 1242    . albuterol (PROVENTIL) (2.5 MG/3ML) 0.083% nebulizer solution 2.5 mg  2.5 mg Nebulization Q2H PRN Christina P Rama, MD      . azithromycin (ZITHROMAX) 500 mg in dextrose 5 % 250 mL IVPB  500 mg Intravenous Q24H Maryruth Bunhristina P Rama, MD      .  calcium carbonate (TUMS - dosed in mg elemental calcium) chewable tablet 200 mg of elemental calcium  1 tablet Oral PRN Christina P Rama, MD      . cefTRIAXone (ROCEPHIN) 1 g in dextrose 5 % 50 mL IVPB  1 g Intravenous Q24H Christina P Rama, MD      . enoxaparin (LOVENOX) injection 40 mg  40 mg Subcutaneous Q24H  Maryruth Bunhristina P Rama, MD   40 mg at 03/10/16 2018  . fluticasone (FLONASE) 50 MCG/ACT nasal spray 2 spray  2 spray Each Nare BID Maryruth Bunhristina P Rama, MD   2 spray at 03/11/16 1026  . ipratropium-albuterol (DUONEB) 0.5-2.5 (3) MG/3ML nebulizer solution 3 mL  3 mL Nebulization TID Maryruth Bunhristina P Rama, MD   3 mL at 03/11/16 0928  . irbesartan (AVAPRO) tablet 300 mg  300 mg Oral Daily Maryruth Bunhristina P Rama, MD   300 mg at 03/11/16 1024  . levothyroxine (SYNTHROID, LEVOTHROID) tablet 75 mcg  75 mcg Oral QAC breakfast Maryruth Bunhristina P Rama, MD   75 mcg at 03/11/16 0732  . loratadine (CLARITIN) tablet 10 mg  10 mg Oral Daily Maryruth Bunhristina P Rama, MD   10 mg at 03/11/16 1024  . mometasone-formoterol (DULERA) 200-5 MCG/ACT inhaler 2 puff  2 puff Inhalation BID Maryruth Bunhristina P Rama, MD   2 puff at 03/11/16 0930  . montelukast (SINGULAIR) tablet 10 mg  10 mg Oral QHS Maryruth Bunhristina P Rama, MD   10 mg at 03/10/16 2236  . nebivolol (BYSTOLIC) tablet 5 mg  5 mg Oral Daily Maryruth Bunhristina P Rama, MD   5 mg at 03/11/16 1024  . pantoprazole (PROTONIX) EC tablet 40 mg  40 mg Oral Daily Maryruth Bunhristina P Rama, MD   40 mg at 03/11/16 1024  . torsemide (DEMADEX) tablet 10 mg  10 mg Oral BID Maryruth Bunhristina P Rama, MD   10 mg at 03/11/16 [id number]     Discharge Medications: Please see discharge summary for a list of discharge medications.  Relevant Imaging Results:  Relevant Lab Results:   Additional Information SSN: [id number]  Clearance CootsNicole A Acy Orsak, LCSW

## 2016-03-11 NOTE — Evaluation (Signed)
Occupational Therapy Evaluation Patient Details Name: [name] [name] MRN: [id number] DOB: April 03, 1941 Today's Date: 03/11/2016    History of Present Illness [name] [name] is an 75 y.o. female with a PMH of asthma, Atrial fibrillation, hypertension and depression who was brought to the hospital for evaluation for altered mental status by her son.    Clinical Impression   Pt admitted with AMS. Pt currently with functional limitations due to the deficits listed below (see OT Problem List).  Pt will benefit from skilled OT to increase their safety and independence with ADL and functional mobility for ADL to facilitate discharge to venue listed below.      Follow Up Recommendations  SNF    Equipment Recommendations  None recommended by OT       Precautions / Restrictions Precautions Precautions: Fall      Mobility Bed Mobility               General bed mobility comments: pt in chair  Transfers Overall transfer level: Needs assistance Equipment used: Rolling walker (2 wheeled) Transfers: Sit to/from Stand Sit to Stand: Mod assist                   ADL Overall ADL's : Needs assistance/impaired Eating/Feeding: Set up;Sitting   Grooming: Set up;Sitting           Upper Body Dressing : Minimal assistance;Sitting   Lower Body Dressing: Moderate assistance;Sit to/from stand;Cueing for safety;Cueing for sequencing;Cueing for compensatory techniques   Toilet Transfer: Moderate assistance Toilet Transfer Details (indicate cue type and reason): sit to stand only -  Toileting- Clothing Manipulation and Hygiene: Maximal assistance;Sit to/from stand;Cueing for safety;Cueing for sequencing                         Pertinent Vitals/Pain Pain Assessment: Faces Faces Pain Scale: Hurts little more Pain Location: right kne Pain Descriptors / Indicators: Tender Pain Intervention(s): Monitored during session        Extremity/Trunk  Assessment Upper Extremity Assessment Upper Extremity Assessment: Generalized weakness           Communication Communication Communication: No difficulties   Cognition Arousal/Alertness: Awake/alert Behavior During Therapy: WFL for tasks assessed/performed Overall Cognitive Status: Within Functional Limits for tasks assessed                                Home Living Family/patient expects to be discharged to:: Private residence Living Arrangements: Alone Available Help at Discharge: Family Type of Home: House Home Access: Stairs to enter Enter[employer] CorporationEntrance Stairs-Number of Steps: 1   Home Layout: Full bath on main level;Two level     Bathroom Shower/Tub: Chief Strategy OfficerTub/shower unit   Bathroom Toilet: Standard     Home Equipment: Environmental consultantWalker - 4 wheels;Cane - single point;Shower seat          Prior Functioning/Environment Level of Independence: Independent                 OT Problem List: Decreased strength;Decreased activity tolerance;Decreased safety awareness   OT Treatment/Interventions: Self-care/ADL training;DME and/or AE instruction;Patient/family education    OT Goals(Current goals can be found in the care plan section) Acute Rehab OT Goals Patient Stated Goal: get well OT Goal Formulation: With patient Time For Goal Achievement: 03/18/16  OT Frequency: Min 2X/week   Barriers to D/C: Decreased caregiver support  End of Session Equipment Utilized During Treatment: Rolling walker Nurse Communication: Mobility status  Activity Tolerance: Patient limited by fatigue Patient left: in chair   Time: [id number] OT Time Calculation (min): 29 min Charges:  OT General Charges $OT Visit: 1 Procedure OT Evaluation $OT Eval Moderate Complexity: 1 Procedure OT Treatments $Self Care/Home Management : 8-22 mins G-Codes:    Einar CrowEDDING, Rayson Rando D 03/11/2016, 1:06 PM

## 2016-03-12 ENCOUNTER — Encounter (HOSPITAL_COMMUNITY): Payer: Self-pay

## 2016-03-12 DIAGNOSIS — Z8489 Family history of other specified conditions: Secondary | ICD-10-CM

## 2016-03-12 DIAGNOSIS — R296 Repeated falls: Secondary | ICD-10-CM

## 2016-03-12 DIAGNOSIS — Z880 Allergy status to penicillin: Secondary | ICD-10-CM

## 2016-03-12 DIAGNOSIS — F331 Major depressive disorder, recurrent, moderate: Secondary | ICD-10-CM

## 2016-03-12 DIAGNOSIS — Z888 Allergy status to other drugs, medicaments and biological substances status: Secondary | ICD-10-CM

## 2016-03-12 DIAGNOSIS — Z818 Family history of other mental and behavioral disorders: Secondary | ICD-10-CM

## 2016-03-12 DIAGNOSIS — Z8249 Family history of ischemic heart disease and other diseases of the circulatory system: Secondary | ICD-10-CM

## 2016-03-12 DIAGNOSIS — Z833 Family history of diabetes mellitus: Secondary | ICD-10-CM

## 2016-03-12 DIAGNOSIS — Z882 Allergy status to sulfonamides status: Secondary | ICD-10-CM

## 2016-03-12 LAB — CBC
HEMATOCRIT: 37.7 % (ref 36.0–46.0)
Hemoglobin: 12.4 g/dL (ref 12.0–15.0)
MCH: 29.5 pg (ref 26.0–34.0)
MCHC: 32.9 g/dL (ref 30.0–36.0)
MCV: 89.8 fL (ref 78.0–100.0)
PLATELETS: 262 10*3/uL (ref 150–400)
RBC: 4.2 MIL/uL (ref 3.87–5.11)
RDW: 14.7 % (ref 11.5–15.5)
WBC: 7.9 10*3/uL (ref 4.0–10.5)

## 2016-03-12 MED ORDER — AZITHROMYCIN 250 MG PO TABS
500.0000 mg | ORAL_TABLET | Freq: Every day | ORAL | Status: DC
  Administered 2016-03-12 – 2016-03-13 (×2): 500 mg via ORAL
  Filled 2016-03-12 (×2): qty 2

## 2016-03-12 MED ORDER — FLUOXETINE HCL 10 MG PO CAPS
10.0000 mg | ORAL_CAPSULE | Freq: Every day | ORAL | Status: DC
  Administered 2016-03-12 – 2016-03-13 (×2): 10 mg via ORAL
  Filled 2016-03-12 (×2): qty 1

## 2016-03-12 NOTE — Progress Notes (Signed)
Alerted MD at this time that pt received her IV Rocephin, but started to scream in pain with her IV site when running IV Zithromax. Med rate slowed down, but it did not help. Med stopped.

## 2016-03-12 NOTE — Progress Notes (Addendum)
PROGRESS NOTE  [name]  NWG:[id number] DOB: [birth year] DOA: 03/10/2016 PCP: Sanda Lingerhomas Jones, MD   Brief Narrative: [name] [name] is an 75 y.o. female with a PMH of asthma, AFib, HTN, and depression brought to ED on 11/16 for altered mental status by her son. Her son noticed problems with her short-term memory approximately 6 months ago. She was evaluated by her PCP who recommended a neurology workup, but the patient canceled the appointment and refused to go. She was referred to neurology again with an appointment scheduled in January. Over the past week, the patient's son reports that she has become increasingly confused. She has had frequent falls and earlier today, the patient's son found her on the floor. She lives alone and called him telling her her husband was in the home (her husband has been deceased for the past year). The patient's son is unaware of any significant psychiatric history, but she has had depression following the death of her first husband. He left her in bad financial straits due to a surreptitious gambling problem. This has been a source of significant stress for the patient.   When I interview the patient, she seems distracted and does not seem to know why she is here in hospital. The patient was evaluated in the ED and a chest radiograph showed possible pneumonia. Urinalysis was negative. Because of her mental status changes, a CT of the head was obtained which showed generalized atrophy, and chronic microvascular ischemic changes which are stable.  Assessment & Plan: Principal Problem:   CAP (community acquired pneumonia) Active Problems:   Hypothyroidism   Depression   Asthma, mild intermittent   Altered mental status   Cognitive impairment   Frequent falls  CAP with mild asthma exacerbation: Left lower lobe pneumonia noted on chest radiography. No hypoxemia. No wheezing on exam, so will continue to avoid steroids to minimize risk of psychiatric  exacerbation. HIV neg. Strep Ag neg. - Following sputum cultures, blood culture - Rocephin/azithromycin IV 11/16 >> PO 11/19 - Continue symbicort - DuoNeb nebs every 6 hours with albuterol every 2 hours as needed.  AMS: Cognitive impairment appears subacute and now worsened with acute illness. Possibly early vascular dementia with CT of the head was negative for acute event, but chronic cerebrovascular disease was evident. B12 wnl. - Has been referred to neurology as outpatient. Consider MRI evaluation.  Hypothyroidism: TSH checked at admission, slightly elevated at 4.78. Doubt this is significantly contributing to mental status. - Continue Synthroid at current dose - Recommend recheck TSH in 6 weeks  Depression: - I've placed consult for formal psychiatric evaluation for symptoms/medication recommendations. Cognitive impairment may be related to this, i.e. pseudodementia. Concern at admission included for psychosis due to hallucinations of dead husband.  Frequent falls - PT/OT recommending SNF.  Chronic HFpEF: Echo 03/2015 w/G1DD, not commented on repeat 09/2015. EF 65-70%. No evidence of exacerbation, weight 220lbs, stable from most recent PCP note. - continue demadex, renal function at normal baseline.   History of anemia of chronic disease: Hgb wnl with normal indices.   DVT prophylaxis: Lovenox Code Status: Full Family Communication: Son contacted this AM Disposition Plan: SNF once medically stable and following psychiatric evaluation.  Consultants:   Psychiatry  Procedures:   None  Antimicrobials:  Rocephin (11/16 >> )  Azithromycin (11/16 >> )   Subjective: Did not sleep well last night. Will scoff at questions to evaluate orientation without answering. Still with some cough but no dyspnea. No fevers. Fair appetite,  hasn't eaten breakfast. Still tangential and perseverates on dead husband.  Objective: Vitals:   03/11/16 1332 03/11/16 2007 03/11/16 2034  03/12/16 0421  BP: 123/89 (!) 144/80  (!) 165/56  Pulse: (!) 103 67  68  Resp: 18 18  18   Temp: 98.8 F (37.1 C) 99 F (37.2 C)  97.8 F (36.6 C)  TempSrc: Oral Oral  Oral  SpO2: 92%  93% 95%  Weight:      Height:        Intake/Output Summary (Last 24 hours) at 03/12/16 0914 Last data filed at 03/12/16 0234  Gross per 24 hour  Intake          2366.25 ml  Output                0 ml  Net          2366.25 ml   Filed Weights   03/10/16 1035  Weight: 99.8 kg (220 lb)    Examination: General exam: 75 y.o. female in no distress Respiratory system: Non-labored breathing room air. Diminished globally but clear without crackles or wheezes. Cardiovascular system: Regular rate and rhythm. No murmur, rub, or gallop. No JVD, and no pedal edema. Gastrointestinal system: Abdomen soft, non-tender, non-distended, with normoactive bowel sounds. No organomegaly or masses felt. Central nervous system: Alert, oriented to "hospital," not which one. When asked the year, states [birth year]. No focal neurological deficits. Extremities: Warm, no deformities Skin: No rashes, lesions no ulcers Psychiatry: Judgement and insight appear impaired.  Data Reviewed: I have personally reviewed following labs and imaging studies  CBC:  Recent Labs Lab 03/10/16 1206 03/12/16 0514  WBC 11.9* 7.9  NEUTROABS 10.3*  --   HGB 13.6 12.4  HCT 40.2 37.7  MCV 88.9 89.8  PLT 295 262   Basic Metabolic Panel:  Recent Labs Lab 03/10/16 1206  NA 139  K 3.5  CL 102  CO2 26  GLUCOSE 143*  BUN 17  CREATININE 0.86  CALCIUM 9.3   GFR: Estimated Creatinine Clearance: 67.7 mL/min (by C-G formula based on SCr of 0.86 mg/dL). Liver Function Tests:  Recent Labs Lab 03/10/16 1206  AST 24  ALT 25  ALKPHOS 79  BILITOT 0.9  PROT 7.6  ALBUMIN 3.9   No results for input(s): LIPASE, AMYLASE in the last 168 hours. No results for input(s): AMMONIA in the last 168 hours. Coagulation Profile: No results for  input(s): INR, PROTIME in the last 168 hours. Cardiac Enzymes: No results for input(s): CKTOTAL, CKMB, CKMBINDEX, TROPONINI in the last 168 hours. BNP (last 3 results)  Recent Labs  03/23/15 1350  PROBNP 78.0   HbA1C: No results for input(s): HGBA1C in the last 72 hours. CBG: No results for input(s): GLUCAP in the last 168 hours. Lipid Profile: No results for input(s): CHOL, HDL, LDLCALC, TRIG, CHOLHDL, LDLDIRECT in the last 72 hours. Thyroid Function Tests: No results for input(s): TSH, T4TOTAL, FREET4, T3FREE, THYROIDAB in the last 72 hours. Anemia Panel: No results for input(s): VITAMINB12, FOLATE, FERRITIN, TIBC, IRON, RETICCTPCT in the last 72 hours. Urine analysis:    Component Value Date/Time   COLORURINE YELLOW 03/10/2016 1200   APPEARANCEUR CLEAR 03/10/2016 1200   LABSPEC 1.020 03/10/2016 1200   PHURINE 5.5 03/10/2016 1200   GLUCOSEU NEGATIVE 03/10/2016 1200   GLUCOSEU NEGATIVE 07/08/2015 1123   HGBUR TRACE (A) 03/10/2016 1200   BILIRUBINUR NEGATIVE 03/10/2016 1200   KETONESUR 15 (A) 03/10/2016 1200   PROTEINUR NEGATIVE 03/10/2016 1200   UROBILINOGEN 0.2  07/08/2015 1123   NITRITE NEGATIVE 03/10/2016 1200   LEUKOCYTESUR NEGATIVE 03/10/2016 1200   Sepsis Labs: @LABRCNTIP (procalcitonin:4,lacticidven:4)  ) Recent Results (from the past 240 hour(s))  Culture, blood (routine x 2) Call MD if unable to obtain prior to antibiotics being given     Status: None (Preliminary result)   Collection Time: 03/10/16  6:37 PM  Result Value Ref Range Status   Specimen Description BLOOD RIGHT ARM  Final   Special Requests BOTTLES DRAWN AEROBIC AND ANAEROBIC 5CC  Final   Culture   Final    NO GROWTH < 24 HOURS [facility]    Report Status PENDING  Incomplete  Culture, blood (routine x 2) Call MD if unable to obtain prior to antibiotics being given     Status: None (Preliminary result)   Collection Time: 03/10/16  6:37 PM  Result Value Ref Range Status    Specimen Description BLOOD RIGHT ARM  Final   Special Requests IN PEDIATRIC BOTTLE 3CC  Final   Culture   Final    NO GROWTH < 24 HOURS [facility]    Report Status PENDING  Incomplete     Radiology Studies: Dg Chest 2 View  Result Date: 03/10/2016 CLINICAL DATA:  Patient with cough, shortness of breath. EXAM: CHEST  2 VIEW COMPARISON:  Chest further 10/09/2015; 01/20/2015. FINDINGS: Normal cardiac and mediastinal contours. Focal consolidation within the left lower hemi thorax. No pleural effusion. No pneumothorax. Mid thoracic spine degenerative changes. IMPRESSION: Focal consolidation left lower hemi thorax concerning for pneumonia in the appropriate clinical setting. Followup PA and lateral chest X-ray is recommended in 3-4 weeks following trial of antibiotic therapy to ensure resolution and exclude underlying malignancy. Electronically Signed   By: Annia Beltrew  Davis M.D.   On: 03/10/2016 12:30   Ct Head Wo Contrast  Result Date: 03/10/2016 CLINICAL DATA:  Altered mental status EXAM: CT HEAD WITHOUT CONTRAST TECHNIQUE: Contiguous axial images were obtained from the base of the skull through the vertex without intravenous contrast. COMPARISON:  CT 10/09/2015 FINDINGS: Brain: Generalized atrophy and ventricular enlargement unchanged. Chronic microvascular ischemic changes in the white matter stable. No acute infarct, mass, hemorrhage Vascular: No hyperdense vessel or unexpected calcification. Skull: Negative Sinuses/Orbits: Prior sinus surgery. Mucosal edema throughout the paranasal sinuses with bony thickening of the maxillary sinus bilaterally due to chronic sinusitis. Normal orbit. Other: None IMPRESSION: Atrophy and chronic microvascular ischemic changes stable. No acute abnormality. Electronically Signed   By: Marlan Palauharles  Clark M.D.   On: 03/10/2016 12:42    Scheduled Meds: . azithromycin  500 mg Intravenous Q24H  . cefTRIAXone (ROCEPHIN)  IV  1 g Intravenous Q24H  .  enoxaparin (LOVENOX) injection  40 mg Subcutaneous Q24H  . fluticasone  2 spray Each Nare BID  . ipratropium-albuterol  3 mL Nebulization TID  . irbesartan  300 mg Oral Daily  . levothyroxine  75 mcg Oral QAC breakfast  . loratadine  10 mg Oral Daily  . mometasone-formoterol  2 puff Inhalation BID  . montelukast  10 mg Oral QHS  . nebivolol  5 mg Oral Daily  . pantoprazole  40 mg Oral Daily  . torsemide  10 mg Oral BID   Continuous Infusions: . sodium chloride 75 mL/hr at 03/12/16 0234     LOS: 2 days   Time spent: 25 minutes.  Hazeline Junkeryan Darcelle Herrada, MD [facility] Pager [phone]  If 7PM-7AM, please contact night-coverage [email or web address] Password [email or web address] 03/12/2016, 9:14 AM

## 2016-03-12 NOTE — Consult Note (Signed)
[facility]   Reason for Consult:  Depression and hallucination. Referring Physician: Dr. Melina ModenaGrunc Patient Identification: [name] [name] MRN:  [id number] Principal Diagnosis: Major depressive disorder, recurrent episode, moderate (HCC) Diagnosis:   Patient Active Problem List   Diagnosis Date Noted  . Major depressive disorder, recurrent episode, moderate (HCC) [F33.1] 03/12/2016    Priority: High  . Frequent falls [R29.6] 03/10/2016  . CAP (community acquired pneumonia) [J18.9] 03/10/2016  . Deficiency anemia [D53.9] 12/01/2015  . Cognitive impairment [R41.89] 12/01/2015  . PAF (paroxysmal atrial fibrillation) (HCC) [I48.0] 10/26/2015  . Chronic obstructive pulmonary disease (HCC) [J44.9]   . Altered mental status [R41.82]   . Non-seasonal allergic rhinitis due to pollen [J30.1] 03/23/2015  . Chronic anticoagulation [Z79.01] 01/20/2015  . Upper airway cough syndrome [R05] 08/23/2013  . 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 Encounter for therapeutic drug monitoring [Z51.81] 05/17/2013  . OAB (overactive bladder) [N32.81] 01/24/2012  . Diastolic dysfunction [I51.9] 09/07/2011  . Other screening mammogram [Z12.31] 09/01/2011  . Type II diabetes mellitus with manifestations (HCC) [E11.8] 05/04/2011  . Hyperlipidemia with target LDL less than 100 [E78.5] 57/84/696205/10/2007  . Hypothyroidism [E03.9] 12/06/2006  . Depression [F32.9] 12/06/2006  . Essential hypertension [I10] 12/06/2006  . Aortic valve disorder [I35.9] 12/06/2006  . Campath-induced atrial fibrillation [I48.91] 12/06/2006  . Allergic rhinitis [J30.9] 12/06/2006  . Asthma, mild intermittent [J45.20] 12/06/2006  . GERD [K21.9] 12/06/2006  . OSTEOARTHRITIS [M19.90] 12/06/2006    Total Time spent with patient: 1 hour  Subjective:   [name] [name] is a 75 y.o. female patient admitted with altered mental status.  HPI: Thanks for asking me to do a consult on [name] [name], a 75 y.o.femalewith a PMH of asthma, AFib, HTN who was  brought to ED on 11/16 for altered mental status by her son. Patient reports history of depression diagnosed in 1992 after the death of her 1st husband for which she briefly received Paxil. However, she reports that she has become more depressed, lonely, withdrawn since her second husband died on Nov 11 th, 2016. Patient reports being stressed out due to poor financial status, living alone and recent onset of memory impairment for which she needs Neurology evaluation. Patient denies suicidal thoughts, delusions, psychosis, drugs or alcohol abuse. She is opened to a trial of antidepressant and willing to accept evaluation of her failing memory.  Past Psychiatric History: MDD  Risk to Self: Is patient at risk for suicide?: No Risk to Others:   Prior Inpatient Therapy:   Prior Outpatient Therapy:    Past Medical History:  Past Medical History:  Diagnosis Date  . Allergic rhinitis   . Aortic insufficiency   . Asthma   . Chronic anticoagulation    on coumadin  . Colon polyps   . Depression   . Diastolic dysfunction   . Falls frequently   . GERD (gastroesophageal reflux disease)   . HLD (hyperlipidemia)   . HTN (hypertension)   . Hypothyroidism   . Migraine, unspecified, without mention of intractable migraine without mention of status migrainosus   . Obesity   . Osteoarthritis   . PAF (paroxysmal atrial fibrillation) (HCC)    on coumadin and Cardiazem  . S/P cardiac cath 2003   no obstructive disease     Past Surgical History:  Procedure Laterality Date  . ABDOMINAL HYSTERECTOMY    . APPENDECTOMY    . REPLACEMENT TOTAL KNEE Right   . TUBAL LIGATION    .  US ECHOCARDIOGRAPHY  2007   Family History:  Family History  Problem Relation Age of Onset  . Heart disease    . Hypertension    . COPD    . Crohn's disease    . Diabetes    . Depression    . Dementia     Family Psychiatric  History:   Father was alcoholic Social History:  History  Alcohol Use  . 3.0 oz/week  . 5 Glasses of wine per week     History  Drug Use No    Social History   Social History  . Marital status: Widowed    Spouse name: N/A  . Number of children: 3  . Years of education: N/A   Occupational History  . Retired Charity fundraiser    Social History Main Topics  . Smoking status: Never Smoker  . Smokeless tobacco: Never Used  . Alcohol use 3.0 oz/week    5 Glasses of wine per week  . Drug use: No  . Sexual activity: Not Currently   Other Topics Concern  . None   Social History Narrative   Lives alone. Mostly sedentary. Doesn't drive. Son does all the cooking and iADLs.   Additional Social History:    Allergies:   Allergies  Allergen Reactions  . Tetracycline Swelling    Swollen tongue  . Acetaminophen Other (See Comments)    ASTHMA MORE SEVERE  . Cardizem [Diltiazem Hcl] Swelling    edema  . Cefaclor Hives, Itching and Rash  . Cephalexin Hives, Itching and Rash  . Clindamycin Hives, Itching and Rash  . Crestor [Rosuvastatin] Other (See Comments)    Muscle aches  . Ibuprofen Other (See Comments)    MAKES ASTHMA WORSE  . Nsaids Other (See Comments)    Respiratory-Asthma WORSE  . Penicillins Hives    Has patient had a PCN reaction causing immediate rash, facial/tongue/throat swelling, SOB or lightheadedness with hypotension: Yes Has patient had a PCN reaction causing severe rash involving mucus membranes or skin necrosis: No Has patient had a PCN reaction that required hospitalization No Has patient had a PCN reaction occurring within the last 10 years: No If all of the above answers are "NO", then may proceed with Cephalosporin use.   . Pravastatin Sodium Other (See Comments)    MYALGIAS  . Simvastatin Other (See Comments)    MYALGIAS  . Sulfonamide Derivatives Hives, Itching and Rash  . Levaquin [Levofloxacin In D5w] Rash  . Lisinopril Cough    Labs:  Results for orders placed  or performed during the hospital encounter of 03/10/16 (from the past 48 hour(s))  HIV antibody     Status: None   Collection Time: 03/10/16  6:37 PM  Result Value Ref Range   HIV Screen 4th Generation wRfx Non Reactive Non Reactive    Comment: (NOTE) Performed At: [facility] [address] [place], [place] [id number] Mila Homer MD WU:[id number]   Culture, blood (routine x 2) Call MD if unable to obtain prior to antibiotics being given     Status: None (Preliminary result)   Collection Time: 03/10/16  6:37 PM  Result Value Ref Range   Specimen Description BLOOD RIGHT ARM    Special Requests BOTTLES DRAWN AEROBIC AND ANAEROBIC 5CC    Culture      NO GROWTH 2 DAYS Performed at [facility]    Report Status PENDING   Culture, blood (routine x 2) Call MD if unable to obtain prior to antibiotics  being given     Status: None (Preliminary result)   Collection Time: 03/10/16  6:37 PM  Result Value Ref Range   Specimen Description BLOOD RIGHT ARM    Special Requests IN PEDIATRIC BOTTLE 3CC    Culture      NO GROWTH 2 DAYS Performed at [facility]    Report Status PENDING   CBC     Status: None   Collection Time: 03/12/16  5:14 AM  Result Value Ref Range   WBC 7.9 4.0 - 10.5 K/uL   RBC 4.20 3.87 - 5.11 MIL/uL   Hemoglobin 12.4 12.0 - 15.0 g/dL   HCT 82.9 56.2 - 13.0 %   MCV 89.8 78.0 - 100.0 fL   MCH 29.5 26.0 - 34.0 pg   MCHC 32.9 30.0 - 36.0 g/dL   RDW 86.5 78.4 - 69.6 %   Platelets 262 150 - 400 K/uL    Current Facility-Administered Medications  Medication Dose Route Frequency Provider Last Rate Last Dose  . albuterol (PROVENTIL) (2.5 MG/3ML) 0.083% nebulizer solution 2.5 mg  2.5 mg Nebulization Q2H PRN Christina P Rama, MD      . azithromycin (ZITHROMAX) 500 mg in dextrose 5 % 250 mL IVPB  500 mg Intravenous Q24H Maryruth Bun Rama, MD   500 mg at 03/11/16 1438  . calcium carbonate (TUMS - dosed in mg elemental calcium) chewable tablet 200 mg of  elemental calcium  1 tablet Oral PRN Christina P Rama, MD      . cefTRIAXone (ROCEPHIN) 1 g in dextrose 5 % 50 mL IVPB  1 g Intravenous Q24H Maryruth Bun Rama, MD   1 g at 03/11/16 1603  . enoxaparin (LOVENOX) injection 40 mg  40 mg Subcutaneous Q24H Maryruth Bun Rama, MD   40 mg at 03/11/16 2123  . FLUoxetine (PROZAC) capsule 10 mg  10 mg Oral Daily Paarth Cropper, MD      . fluticasone (FLONASE) 50 MCG/ACT nasal spray 2 spray  2 spray Each Nare BID Maryruth Bun Rama, MD   2 spray at 03/12/16 1006  . ipratropium-albuterol (DUONEB) 0.5-2.5 (3) MG/3ML nebulizer solution 3 mL  3 mL Nebulization TID Maryruth Bun Rama, MD   3 mL at 03/12/16 0928  . irbesartan (AVAPRO) tablet 300 mg  300 mg Oral Daily Maryruth Bun Rama, MD   300 mg at 03/12/16 1006  . levothyroxine (SYNTHROID, LEVOTHROID) tablet 75 mcg  75 mcg Oral QAC breakfast Maryruth Bun Rama, MD   75 mcg at 03/12/16 0846  . loratadine (CLARITIN) tablet 10 mg  10 mg Oral Daily Maryruth Bun Rama, MD   10 mg at 03/12/16 1006  . mometasone-formoterol (DULERA) 200-5 MCG/ACT inhaler 2 puff  2 puff Inhalation BID Maryruth Bun Rama, MD   2 puff at 03/12/16 0928  . montelukast (SINGULAIR) tablet 10 mg  10 mg Oral QHS Maryruth Bun Rama, MD   10 mg at 03/11/16 2123  . nebivolol (BYSTOLIC) tablet 5 mg  5 mg Oral Daily Maryruth Bun Rama, MD   5 mg at 03/12/16 1006  . pantoprazole (PROTONIX) EC tablet 40 mg  40 mg Oral Daily Maryruth Bun Rama, MD   40 mg at 03/12/16 1006  . torsemide (DEMADEX) tablet 10 mg  10 mg Oral BID Maryruth Bun Rama, MD   10 mg at 03/12/16 0846    Musculoskeletal: Strength & Muscle Tone: within normal limits Gait & Station: normal Patient leans: N/A  Psychiatric Specialty Exam: Physical Exam  Psychiatric: Judgment  and thought content normal. Her speech is delayed. She is slowed and withdrawn. She exhibits a depressed mood. She exhibits abnormal recent memory.    Review of Systems  Constitutional: Positive for malaise/fatigue.  HENT:  Negative.   Eyes: Negative.   Cardiovascular: Negative.   Gastrointestinal: Negative.   Genitourinary: Negative.   Musculoskeletal: Negative.   Skin: Negative.   Neurological: Negative.   Endo/Heme/Allergies: Negative.   Psychiatric/Behavioral: Positive for depression.    Blood pressure (!) 165/56, pulse 68, temperature 97.8 F (36.6 C), temperature source Oral, resp. rate 18, height 5\' 8"  (1.727 m), weight 99.8 kg (220 lb), last menstrual period 12/01/2010, SpO2 95 %.Body mass index is 33.45 kg/m.  General Appearance: Casual  Eye Contact:  Good  Speech:  Clear and Coherent  Volume:  Normal  Mood:  Depressed  Affect:  Constricted  Thought Process:  Coherent  Orientation:  Other:  place and person  Thought Content:  Logical  Suicidal Thoughts:  No  Homicidal Thoughts:  No  Memory:  Immediate;   Fair Recent;   Poor Remote;   Good  Judgement:  Intact  Insight:  Fair  Psychomotor Activity:  Psychomotor Retardation  Concentration:  Concentration: Fair and Attention Span: Fair  Recall:  [employer]Fair  Fund of Knowledge:  Fair  Language:  Good  Akathisia:  No  Handed:  Right  AIMS (if indicated):     Assets:  Communication Skills Desire for Improvement  ADL's:  Impaired  Cognition:  Impaired,  Mild  Sleep:   fair     Treatment Plan Summary: Plan/Recommendations: -Neurologist referral for impaired memory -Start Prozac 10 mg daily for depression -Unit social worker to discuss Assisted living placement with family and patient -Unit social worker to schedule gero-psychiatric follow up care upon discharge  Disposition: No evidence of imminent risk to self or others at present.   Patient does not meet criteria for psychiatric inpatient admission. Supportive therapy provided about ongoing stressors. Outpatient referral to a Geriatric psychiatrist upon discharge  Thedore Minskintayo, Brysyn Brandenberger, MD 03/12/2016 12:35 PM

## 2016-03-12 NOTE — Progress Notes (Signed)
New orders received and noted. 

## 2016-03-13 ENCOUNTER — Inpatient Hospital Stay (HOSPITAL_COMMUNITY): Payer: Medicare Other

## 2016-03-13 ENCOUNTER — Other Ambulatory Visit (HOSPITAL_COMMUNITY): Payer: Self-pay | Admitting: Radiology

## 2016-03-13 DIAGNOSIS — R259 Unspecified abnormal involuntary movements: Secondary | ICD-10-CM | POA: Diagnosis not present

## 2016-03-13 DIAGNOSIS — R41841 Cognitive communication deficit: Secondary | ICD-10-CM | POA: Diagnosis not present

## 2016-03-13 DIAGNOSIS — I10 Essential (primary) hypertension: Secondary | ICD-10-CM | POA: Diagnosis not present

## 2016-03-13 DIAGNOSIS — E038 Other specified hypothyroidism: Secondary | ICD-10-CM | POA: Diagnosis not present

## 2016-03-13 DIAGNOSIS — I480 Paroxysmal atrial fibrillation: Secondary | ICD-10-CM | POA: Diagnosis not present

## 2016-03-13 DIAGNOSIS — I482 Chronic atrial fibrillation: Secondary | ICD-10-CM | POA: Diagnosis not present

## 2016-03-13 DIAGNOSIS — J45909 Unspecified asthma, uncomplicated: Secondary | ICD-10-CM | POA: Diagnosis not present

## 2016-03-13 DIAGNOSIS — F331 Major depressive disorder, recurrent, moderate: Secondary | ICD-10-CM | POA: Diagnosis not present

## 2016-03-13 DIAGNOSIS — J189 Pneumonia, unspecified organism: Secondary | ICD-10-CM | POA: Diagnosis not present

## 2016-03-13 DIAGNOSIS — J449 Chronic obstructive pulmonary disease, unspecified: Secondary | ICD-10-CM | POA: Diagnosis not present

## 2016-03-13 DIAGNOSIS — I509 Heart failure, unspecified: Secondary | ICD-10-CM | POA: Diagnosis not present

## 2016-03-13 DIAGNOSIS — G9340 Encephalopathy, unspecified: Secondary | ICD-10-CM | POA: Diagnosis not present

## 2016-03-13 DIAGNOSIS — E039 Hypothyroidism, unspecified: Secondary | ICD-10-CM | POA: Diagnosis not present

## 2016-03-13 DIAGNOSIS — F329 Major depressive disorder, single episode, unspecified: Secondary | ICD-10-CM | POA: Diagnosis not present

## 2016-03-13 DIAGNOSIS — M6281 Muscle weakness (generalized): Secondary | ICD-10-CM | POA: Diagnosis not present

## 2016-03-13 DIAGNOSIS — R278 Other lack of coordination: Secondary | ICD-10-CM | POA: Diagnosis not present

## 2016-03-13 DIAGNOSIS — R2689 Other abnormalities of gait and mobility: Secondary | ICD-10-CM | POA: Diagnosis not present

## 2016-03-13 DIAGNOSIS — R4181 Age-related cognitive decline: Secondary | ICD-10-CM | POA: Diagnosis not present

## 2016-03-13 DIAGNOSIS — I5032 Chronic diastolic (congestive) heart failure: Secondary | ICD-10-CM | POA: Diagnosis not present

## 2016-03-13 MED ORDER — LEVOFLOXACIN 750 MG PO TABS: 750.0000 mg | ORAL_TABLET | Freq: Every day | ORAL | Status: DC

## 2016-03-13 MED ORDER — CEFPODOXIME PROXETIL 200 MG PO TABS
200.0000 mg | ORAL_TABLET | Freq: Two times a day (BID) | ORAL | Status: DC
  Administered 2016-03-13: 200 mg via ORAL
  Filled 2016-03-13 (×2): qty 1

## 2016-03-13 MED ORDER — IPRATROPIUM-ALBUTEROL 0.5-2.5 (3) MG/3ML IN SOLN: 3.0000 mL | Freq: Two times a day (BID) | RESPIRATORY_TRACT | Status: DC

## 2016-03-13 MED ORDER — CEFIXIME 400 MG PO CAPS: 400.0000 mg | ORAL_CAPSULE | Freq: Every day | ORAL | Status: DC

## 2016-03-13 MED ORDER — GADOBENATE DIMEGLUMINE 529 MG/ML IV SOLN
20.0000 mL | Freq: Once | INTRAVENOUS | Status: AC | PRN
  Administered 2016-03-13: 20 mL via INTRAVENOUS

## 2016-03-13 MED ORDER — FLUOXETINE HCL 10 MG PO CAPS: 10.0000 mg | ORAL_CAPSULE | Freq: Every day | ORAL | Status: DC

## 2016-03-13 MED ORDER — CEFPODOXIME PROXETIL 200 MG PO TABS: 200.0000 mg | ORAL_TABLET | Freq: Two times a day (BID) | ORAL | 0 refills | Status: AC

## 2016-03-13 NOTE — Care Management Note (Signed)
Case Management Note  Patient Details  Name: [name] [name] MRN: [id number] Date of Birth: 1941-04-16  Subjective/Objective:  CAP with mild asthma exacerbation,  Depression, cognitive impairment, frequent falls                 Action/Plan: Discharge Planning: AVS reviewed: Chart reviewed. NCM spoke to pt. States she lives alone. She has RW at home. CSW following for SNF-rehab.                Expected Discharge Plan:  Skilled Nursing Facility  In-House Referral:  Clinical Social Work  Discharge planning Services  CM Consult  Post Acute Care Choice:  NA Choice offered to:  NA  DME Arranged:  N/A DME Agency:  NA  HH Arranged:  NA HH Agency:  NA  Status of Service:  Completed, signed off  If discussed at Long Length of Stay Meetings, dates discussed:    Additional Comments:  [name], [name], RN 03/13/2016, 3:41 PM

## 2016-03-13 NOTE — Progress Notes (Signed)
At this time, Clinical research associatewriter gave report [facility] receiving [facility]

## 2016-03-13 NOTE — Discharge Summary (Signed)
Physician Discharge Summary  [name] [name] ZOX:[id number] DOB: 03/08/1941 DOA: 03/10/2016  PCP: Sanda Linger, MD  Admit date: 03/10/2016 Discharge date: 03/13/2016  Admitted From: Home Disposition: [facility]   Recommendations for Outpatient Follow-up:  1. Follow up with PCP following SNF rehabilitation 2. Refer for dedicated neuropsychiatric assessment  3. Continue prozac 10mg , monitor safety, efficacy, tolerability. 4. Complete 7 day course of antibiotics withcefpodoxime. Patient has multiple drug allergies, but tolerated rocephin so this third generation cephalosporin should not cause an adverse reaction. 5. Please follow up blood and sputum cultures, negative at time of discharge.   6. TSH mildly elevated at 4.78 possibly due to incomplete medication adherence to synthroid. Recheck in 6 - 8 weeks is recommended.  Home Health: Per SNF Equipment/Devices: Per SNF  Discharge Condition: Stable CODE STATUS: Full Diet recommendation: Heart healthy  Brief/Interim Summary: [name] [name] an 75 y.o.femalewith a PMH of asthma, AFib, HTN, and depression brought to ED on 11/16 for altered mental status by her son. Her son noticed problems with her short-term memory approximately 6 months ago. She was evaluated by her PCP who recommended a neurology workup, but the patient canceled the appointment and refused to go. She was referred to neurology again with an appointment scheduled in January. Over the past week, the patient's son reports that she has become increasingly confused. She has had frequent falls and earlier today, the patient's son found her on the floor. She lives alone and called him telling her her husband was in the home (her husband has been deceased for the past year). The patient's son is unaware of any significant psychiatric history, but she has had depression following the death of her first husband. He left her in bad financial straits due to a  surreptitious gambling problem. This has been a source of significant stress for the patient.   When I interview the patient, she seems distracted and does not seem to know why she is here in hospital.The patient was evaluated in the ED and a chest radiograph showed possible pneumonia. Urinalysis was negative. Because of her mental status changes, a CT of the head was obtained which showed generalized atrophy, and chronic microvascular ischemic changes which are stable. IV rocephin and azithromycin were given with improvement in respiratory status. Her breathing has returned to baseline, off oxygen. A psychiatric evaluation was performed and prozac 10mg  was started in hopes of treating depression. Due to continued cognitive impairment, MRI brain wo and w contrast was performed 11/19 showing no new causes of cognitive impairment (continued evidence of chronic microvascular disease). There was an 8mm meningioma seen, so neurology, Dr. Roxy Manns, was consulted by phone and he recommends no further work up as this is unlikely to be clinically significant or have any impact on mortality. A formal neuropsychological evaluation is recommended as an outpatient.   Discharge Diagnoses:  Principal Problem:   Major depressive disorder, recurrent episode, moderate (HCC) Active Problems:   Hypothyroidism   Depression   Asthma, mild intermittent   Altered mental status   Cognitive impairment   Frequent falls   CAP (community acquired pneumonia)  CAP with mild asthma exacerbation: Left lower lobe pneumonia noted on chest radiography. No hypoxemia. No wheezing on exam, so will continue to avoid steroids to minimize risk of psychiatric exacerbation. HIV neg. Strep Ag neg. - Following sputum cultures, blood cultures no growth at time of discharge.  - Rocephin/azithromycin IV 11/16 >> cefpodoxime 11/19 >> 11/22 - Continue symbicort - DuoNeb nebs every  6 hours with albuterol every 2 hours as needed.  Cognitive  impairment: Subacute and now worsened with acute illness. Possibly early vascular dementia with CT of the head was negative for acute event, but chronic cerebrovascular disease was evident. B12 wnl. Also ?anoxic brain injury during admission requiring intubation. MRI full results below.   Depression: - Psychiatry evaluation 11/18: recommend ongoing psychiatric care and started prozac 10mg .  - Cognitive impairment may be related to this, i.e. pseudodementia. Hypothyroidism: TSH checked at admission, slightly elevated at 4.78. Doubt this is significantly contributing to mental status. - Continue Synthroid at current dose - Recommend recheck TSH in 6 weeks  Frequent falls - PT/OT recommending SNF.  Chronic HFpEF: Echo 03/2015 w/G1DD, not commented on repeat 09/2015. EF 65-70%. No evidence of exacerbation, weight 220lbs, stable from most recent PCP note. - continue demadex, renal function at normal baseline.   History of anemia of chronic disease: Hgb wnl with normal indices.   Discharge Instructions Discharge Instructions    (HEART FAILURE PATIENTS) Call MD:  Anytime you have any of the following symptoms: 1) 3 pound weight gain in 24 hours or 5 pounds in 1 week 2) shortness of breath, with or without a dry hacking cough 3) swelling in the hands, feet or stomach 4) if you have to sleep on extra pillows at night in order to breathe.    Complete by:  As directed    Call MD for:  difficulty breathing, headache or visual disturbances    Complete by:  As directed    Diet - low sodium heart healthy    Complete by:  As directed    Discharge instructions    Complete by:  As directed    You were admitted for pneumonia which also caused a mild asthma exacerbation which has resolved with the use of antibiotics. You will be discharged to [facility] with the following recommendations:  - Continue taking cefpodoxime once daily, take the last dose 11/22. - Continue all breathing treatments you were  taking regularly at home  - Continue all other medications as ordered at discharge  Fluoxetine was started for depression, and it is recommended that you continue taking this for at least 6 - 8 weeks to see if it helps with symptoms.   It is important for you to follow up with neuropsychiatry as an outpatient for a dedicated cognitive assessment.   Increase activity slowly    Complete by:  As directed        Medication List    TAKE these medications   albuterol (2.5 MG/3ML) 0.083% nebulizer solution Commonly known as:  PROVENTIL Take 2.5 mg by nebulization every 6 (six) hours as needed for wheezing or shortness of breath.   albuterol 108 (90 Base) MCG/ACT inhaler Commonly known as:  PROAIR HFA Inhale 2 puffs into the lungs every 6 (six) hours as needed for wheezing or shortness of breath.   budesonide-formoterol 160-4.5 MCG/ACT inhaler Commonly known as:  SYMBICORT Inhale 2 puffs into the lungs 2 (two) times daily.   calcium carbonate 500 MG chewable tablet Commonly known as:  TUMS - dosed in mg elemental calcium Chew 1 tablet by mouth as needed for indigestion or heartburn.   cefpodoxime 200 MG tablet Commonly known as:  VANTIN Take 1 tablet (200 mg total) by mouth every 12 (twelve) hours.   esomeprazole 40 MG capsule Commonly known as:  NEXIUM Take 1 capsule (40 mg total) by mouth 2 (two) times daily before a meal.  FLUoxetine 10 MG capsule Commonly known as:  PROZAC Take 1 capsule (10 mg total) by mouth daily. Start taking on:  03/14/2016   fluticasone 50 MCG/ACT nasal spray Commonly known as:  FLONASE Place 2 sprays into both nostrils daily. What changed:  when to take this   ibuprofen 200 MG tablet Commonly known as:  ADVIL,MOTRIN Take 400 mg by mouth every 6 (six) hours as needed for fever, headache, mild pain, moderate pain or cramping.   influenza vac recom quadrivalent 0.5 ML injection Commonly known as:  FLUBLOK Inject 0.5 mLs into the muscle once.    levothyroxine 75 MCG tablet Commonly known as:  SYNTHROID, LEVOTHROID Take 1 tablet (75 mcg total) by mouth daily.   loratadine 10 MG tablet Commonly known as:  CLARITIN Take 10 mg by mouth daily.   montelukast 10 MG tablet Commonly known as:  SINGULAIR Take 1 tablet (10 mg total) by mouth at bedtime.   nebivolol 5 MG tablet Commonly known as:  BYSTOLIC Take 1 tablet (5 mg total) by mouth daily.   telmisartan 80 MG tablet Commonly known as:  MICARDIS Take 1 tablet (80 mg total) by mouth daily.   torsemide 10 MG tablet Commonly known as:  DEMADEX Take 1 tablet (10 mg total) by mouth 2 (two) times daily.      Follow-up Information    Sanda Linger, MD Follow up.   Specialty:  Internal Medicine Contact information: [address] [place] [address] [phone]          Allergies  Allergen Reactions  . Tetracycline Swelling    Swollen tongue  . Acetaminophen Other (See Comments)    ASTHMA MORE SEVERE  . Cardizem [Diltiazem Hcl] Swelling    edema  . Cefaclor Hives, Itching and Rash  . Cephalexin Hives, Itching and Rash  . Clindamycin Hives, Itching and Rash  . Crestor [Rosuvastatin] Other (See Comments)    Muscle aches  . Ibuprofen Other (See Comments)    MAKES ASTHMA WORSE  . Nsaids Other (See Comments)    Respiratory-Asthma WORSE  . Penicillins Hives    Has patient had a PCN reaction causing immediate rash, facial/tongue/throat swelling, SOB or lightheadedness with hypotension: Yes Has patient had a PCN reaction causing severe rash involving mucus membranes or skin necrosis: No Has patient had a PCN reaction that required hospitalization No Has patient had a PCN reaction occurring within the last 10 years: No If all of the above answers are "NO", then may proceed with Cephalosporin use.   . Pravastatin Sodium Other (See Comments)    MYALGIAS  . Simvastatin Other (See Comments)    MYALGIAS  . Sulfonamide Derivatives Hives, Itching and  Rash  . Levaquin [Levofloxacin In D5w] Rash  . Lisinopril Cough    Consultations:  Psychiatry  NeuroHospitalist, Dr. Roxy Manns by phone only  Procedures/Studies: Dg Chest 2 View  Result Date: 03/10/2016 CLINICAL DATA:  Patient with cough, shortness of breath. EXAM: CHEST  2 VIEW COMPARISON:  Chest further 10/09/2015; 01/20/2015. FINDINGS: Normal cardiac and mediastinal contours. Focal consolidation within the left lower hemi thorax. No pleural effusion. No pneumothorax. Mid thoracic spine degenerative changes. IMPRESSION: Focal consolidation left lower hemi thorax concerning for pneumonia in the appropriate clinical setting. Followup PA and lateral chest X-ray is recommended in 3-4 weeks following trial of antibiotic therapy to ensure resolution and exclude underlying malignancy. Electronically Signed   By: Annia Belt M.D.   On: 03/10/2016 12:30   Ct Head Wo  Contrast  Result Date: 03/10/2016 CLINICAL DATA:  Altered mental status EXAM: CT HEAD WITHOUT CONTRAST TECHNIQUE: Contiguous axial images were obtained from the base of the skull through the vertex without intravenous contrast. COMPARISON:  CT 10/09/2015 FINDINGS: Brain: Generalized atrophy and ventricular enlargement unchanged. Chronic microvascular ischemic changes in the white matter stable. No acute infarct, mass, hemorrhage Vascular: No hyperdense vessel or unexpected calcification. Skull: Negative Sinuses/Orbits: Prior sinus surgery. Mucosal edema throughout the paranasal sinuses with bony thickening of the maxillary sinus bilaterally due to chronic sinusitis. Normal orbit. Other: None IMPRESSION: Atrophy and chronic microvascular ischemic changes stable. No acute abnormality. Electronically Signed   By: Marlan Palauharles  Clark M.D.   On: 03/10/2016 12:42   Mr Laqueta JeanBrain W ONWo Contrast  Result Date: 03/13/2016 CLINICAL DATA:  Acute cognitive decline over 6 months. EXAM: MRI HEAD WITHOUT AND WITH CONTRAST TECHNIQUE: Multiplanar, multiecho pulse  sequences of the brain and surrounding structures were obtained without and with intravenous contrast. CONTRAST:  20mL MULTIHANCE GADOBENATE DIMEGLUMINE 529 MG/ML IV SOLN COMPARISON:  Head CT from 3 days ago FINDINGS: Brain: No specific or reversible explanation for memory loss. No extra-axial collection, intra-axial mass, or cortical/thalamic diffusion abnormality. There is lateral ventriculomegaly, especially the third ventricles, with mild upward arching of the corpus callosum, but lack of temporal horn dilatation or third ventricular ballooning argues against communicating hydrocephalus. Ventriculomegaly is likely from volume loss. Medial temporal volume loss is mild. No acute infarct. There is extensive T2 and FLAIR hyperintensity in the pons, likely chronic microvascular disease in this patient with multiple vascular risk factors. T2 hypo intense mass along the posterior left cerebral convexity that is homogeneously enhancing and measures up to 8 mm. Vascular: Normal flow voids Skull and upper cervical spine: Negative Sinuses/Orbits: Status post endoscopic sinus surgery. No acute sinusitis IMPRESSION:  1. No specific explanation for memory loss.  2. Chronic microvascular disease that is most notable in the pons.  3. Lateral ventriculomegaly, greatest in the frontal horns, that favors central predominant atrophy. Atrophy has progressed since 2007 sinus CT.  4. 8 mm presumed meningioma along the posterior left cerebral convexity.  Electronically Signed   By: Marnee SpringJonathon  Watts M.D.   On: 03/13/2016 13:22   Subjective: Pt feels well, breathing normally, no complaints.   Discharge Exam: Vitals:   03/12/16 2209 03/13/16 0653  BP: 126/80 133/62  Pulse: (!) 57 64  Resp: 17 18  Temp: 99 F (37.2 C) 98.2 F (36.8 C)   Vitals:   03/12/16 2209 03/13/16 0653 03/13/16 0827 03/13/16 0830  BP: 126/80 133/62    Pulse: (!) 57 64    Resp: 17 18    Temp: 99 F (37.2 C) 98.2 F (36.8 C)    TempSrc: Oral  Oral    SpO2: 95% 93% 92% 92%  Weight:      Height:       General: Pt is alert, awake, not in acute distress Cardiovascular: RRR, S1/S2 +, no rubs, no gallops Respiratory: Nonlabored on room air, CTA bilaterally, no wheezing, no rhonchi Abdominal: Soft, NT, ND, bowel sounds + Extremities: no edema, no cyanosis  The results of significant diagnostics from this hospitalization (including imaging, microbiology, ancillary and laboratory) are listed below for reference.    Microbiology: Recent Results (from the past 240 hour(s))  Culture, blood (routine x 2) Call MD if unable to obtain prior to antibiotics being given     Status: None (Preliminary result)   Collection Time: 03/10/16  6:37 PM  Result Value  Ref Range Status   Specimen Description BLOOD RIGHT ARM  Final   Special Requests BOTTLES DRAWN AEROBIC AND ANAEROBIC 5CC  Final   Culture   Final    NO GROWTH 2 DAYS Performed at [facility]    Report Status PENDING  Incomplete  Culture, blood (routine x 2) Call MD if unable to obtain prior to antibiotics being given     Status: None (Preliminary result)   Collection Time: 03/10/16  6:37 PM  Result Value Ref Range Status   Specimen Description BLOOD RIGHT ARM  Final   Special Requests IN PEDIATRIC BOTTLE 3CC  Final   Culture   Final    NO GROWTH 2 DAYS Performed at [facility]    Report Status PENDING  Incomplete     Labs: BNP (last 3 results)  Recent Labs  03/30/15 1733  BNP 290.7*   Basic Metabolic Panel:  Recent Labs Lab 03/10/16 1206  NA 139  K 3.5  CL 102  CO2 26  GLUCOSE 143*  BUN 17  CREATININE 0.86  CALCIUM 9.3   Liver Function Tests:  Recent Labs Lab 03/10/16 1206  AST 24  ALT 25  ALKPHOS 79  BILITOT 0.9  PROT 7.6  ALBUMIN 3.9   No results for input(s): LIPASE, AMYLASE in the last 168 hours. No results for input(s): AMMONIA in the last 168 hours. CBC:  Recent Labs Lab 03/10/16 1206 03/12/16 0514  WBC 11.9* 7.9   NEUTROABS 10.3*  --   HGB 13.6 12.4  HCT 40.2 37.7  MCV 88.9 89.8  PLT 295 262   Cardiac Enzymes: No results for input(s): CKTOTAL, CKMB, CKMBINDEX, TROPONINI in the last 168 hours. BNP: Invalid input(s): POCBNP CBG: No results for input(s): GLUCAP in the last 168 hours. D-Dimer No results for input(s): DDIMER in the last 72 hours. Hgb A1c No results for input(s): HGBA1C in the last 72 hours. Lipid Profile No results for input(s): CHOL, HDL, LDLCALC, TRIG, CHOLHDL, LDLDIRECT in the last 72 hours. Thyroid function studies No results for input(s): TSH, T4TOTAL, T3FREE, THYROIDAB in the last 72 hours.  Invalid input(s): FREET3 Anemia work up No results for input(s): VITAMINB12, FOLATE, FERRITIN, TIBC, IRON, RETICCTPCT in the last 72 hours. Urinalysis    Component Value Date/Time   COLORURINE YELLOW 03/10/2016 1200   APPEARANCEUR CLEAR 03/10/2016 1200   LABSPEC 1.020 03/10/2016 1200   PHURINE 5.5 03/10/2016 1200   GLUCOSEU NEGATIVE 03/10/2016 1200   GLUCOSEU NEGATIVE 07/08/2015 1123   HGBUR TRACE (A) 03/10/2016 1200   BILIRUBINUR NEGATIVE 03/10/2016 1200   KETONESUR 15 (A) 03/10/2016 1200   PROTEINUR NEGATIVE 03/10/2016 1200   UROBILINOGEN 0.2 07/08/2015 1123   NITRITE NEGATIVE 03/10/2016 1200   LEUKOCYTESUR NEGATIVE 03/10/2016 1200   Sepsis Labs Invalid input(s): PROCALCITONIN,  WBC,  LACTICIDVEN Microbiology Recent Results (from the past 240 hour(s))  Culture, blood (routine x 2) Call MD if unable to obtain prior to antibiotics being given     Status: None (Preliminary result)   Collection Time: 03/10/16  6:37 PM  Result Value Ref Range Status   Specimen Description BLOOD RIGHT ARM  Final   Special Requests BOTTLES DRAWN AEROBIC AND ANAEROBIC 5CC  Final   Culture   Final    NO GROWTH 2 DAYS Performed at [facility]    Report Status PENDING  Incomplete  Culture, blood (routine x 2) Call MD if unable to obtain prior to antibiotics being given     Status:  None (Preliminary result)   Collection Time: 03/10/16  6:37 PM  Result Value Ref Range Status   Specimen Description BLOOD RIGHT ARM  Final   Special Requests IN PEDIATRIC BOTTLE 3CC  Final   Culture   Final    NO GROWTH 2 DAYS [facility]formed at [facility]    Report Status PENDING  Incomplete    Time coordinating discharge: Over 30 minutes  Hazeline Junkeryan Jrake Rodriquez, MD  [facility] 03/13/2016, 2:17 PM Pager [phone]  If 7PM-7AM, please contact night-coverage [email or web address] Password [email or web address]

## 2016-03-13 NOTE — Progress Notes (Signed)
Pt will be leaving this evening headed to [facility]. Family and pt aware of transfer. Pt VS WNL. Pt without c/o.

## 2016-03-13 NOTE — Clinical Social Work Note (Signed)
Pt is ready for discharge today and will go to [facility]. Facility has received discharge information. Pt's son will meet pt at facility to complete paperwork. Pt and son are aware and agreeable to discharge plan. RN called report. [facility] will provide transportation. CSW is signing off as no further needs identified.   Dede QuerySarah Eyal Greenhaw, MSW, LCSW  Clinical Social Worker  [phone]

## 2016-03-13 NOTE — Care Management Important Message (Signed)
Important Message  Patient Details  Name: [name] [name] MRN: [id number] Date of Birth: 05-Jun-1940   Medicare Important Message Given:  Yes    [name], Keniesha Adderly Ellen, RN 03/13/2016, 3:20 PM

## 2016-03-13 NOTE — Progress Notes (Signed)
Pt leaving at this time with [facility] headed to [facility].

## 2016-03-14 DIAGNOSIS — J189 Pneumonia, unspecified organism: Secondary | ICD-10-CM | POA: Diagnosis not present

## 2016-03-14 DIAGNOSIS — I482 Chronic atrial fibrillation: Secondary | ICD-10-CM | POA: Diagnosis not present

## 2016-03-14 DIAGNOSIS — I10 Essential (primary) hypertension: Secondary | ICD-10-CM | POA: Diagnosis not present

## 2016-03-14 DIAGNOSIS — E038 Other specified hypothyroidism: Secondary | ICD-10-CM | POA: Diagnosis not present

## 2016-03-14 DIAGNOSIS — I5032 Chronic diastolic (congestive) heart failure: Secondary | ICD-10-CM | POA: Diagnosis not present

## 2016-03-14 DIAGNOSIS — F329 Major depressive disorder, single episode, unspecified: Secondary | ICD-10-CM | POA: Diagnosis not present

## 2016-03-14 DIAGNOSIS — G9340 Encephalopathy, unspecified: Secondary | ICD-10-CM | POA: Diagnosis not present

## 2016-03-14 DIAGNOSIS — R2689 Other abnormalities of gait and mobility: Secondary | ICD-10-CM | POA: Diagnosis not present

## 2016-03-15 ENCOUNTER — Telehealth: Payer: Self-pay

## 2016-03-15 DIAGNOSIS — I509 Heart failure, unspecified: Secondary | ICD-10-CM | POA: Diagnosis not present

## 2016-03-15 DIAGNOSIS — J45909 Unspecified asthma, uncomplicated: Secondary | ICD-10-CM | POA: Diagnosis not present

## 2016-03-15 DIAGNOSIS — E039 Hypothyroidism, unspecified: Secondary | ICD-10-CM | POA: Diagnosis not present

## 2016-03-15 DIAGNOSIS — J189 Pneumonia, unspecified organism: Secondary | ICD-10-CM | POA: Diagnosis not present

## 2016-03-15 LAB — CULTURE, BLOOD (ROUTINE X 2)
Culture: NO GROWTH
Culture: NO GROWTH

## 2016-03-15 NOTE — Telephone Encounter (Signed)
[facility] to [facility] for rehabilitation.  Will follow up with PCP after discharge from facility

## 2016-03-22 DIAGNOSIS — I509 Heart failure, unspecified: Secondary | ICD-10-CM | POA: Diagnosis not present

## 2016-03-22 DIAGNOSIS — F329 Major depressive disorder, single episode, unspecified: Secondary | ICD-10-CM | POA: Diagnosis not present

## 2016-03-22 DIAGNOSIS — J45909 Unspecified asthma, uncomplicated: Secondary | ICD-10-CM | POA: Diagnosis not present

## 2016-03-22 DIAGNOSIS — J189 Pneumonia, unspecified organism: Secondary | ICD-10-CM | POA: Diagnosis not present

## 2016-03-27 IMAGING — CR DG CHEST 2V
2 series · 2 of 2 positions shown · non-contrast
Comparison: DG CHEST 1V PORT dated 08/23/2013; DG CHEST 2 VIEW dated
08/20/2013; DG CHEST 2 VIEW dated 06/17/2011

CLINICAL DATA: Pneumonia, hypertension, history of asthma

EXAM:
CHEST  2 VIEW

[w chest pa]
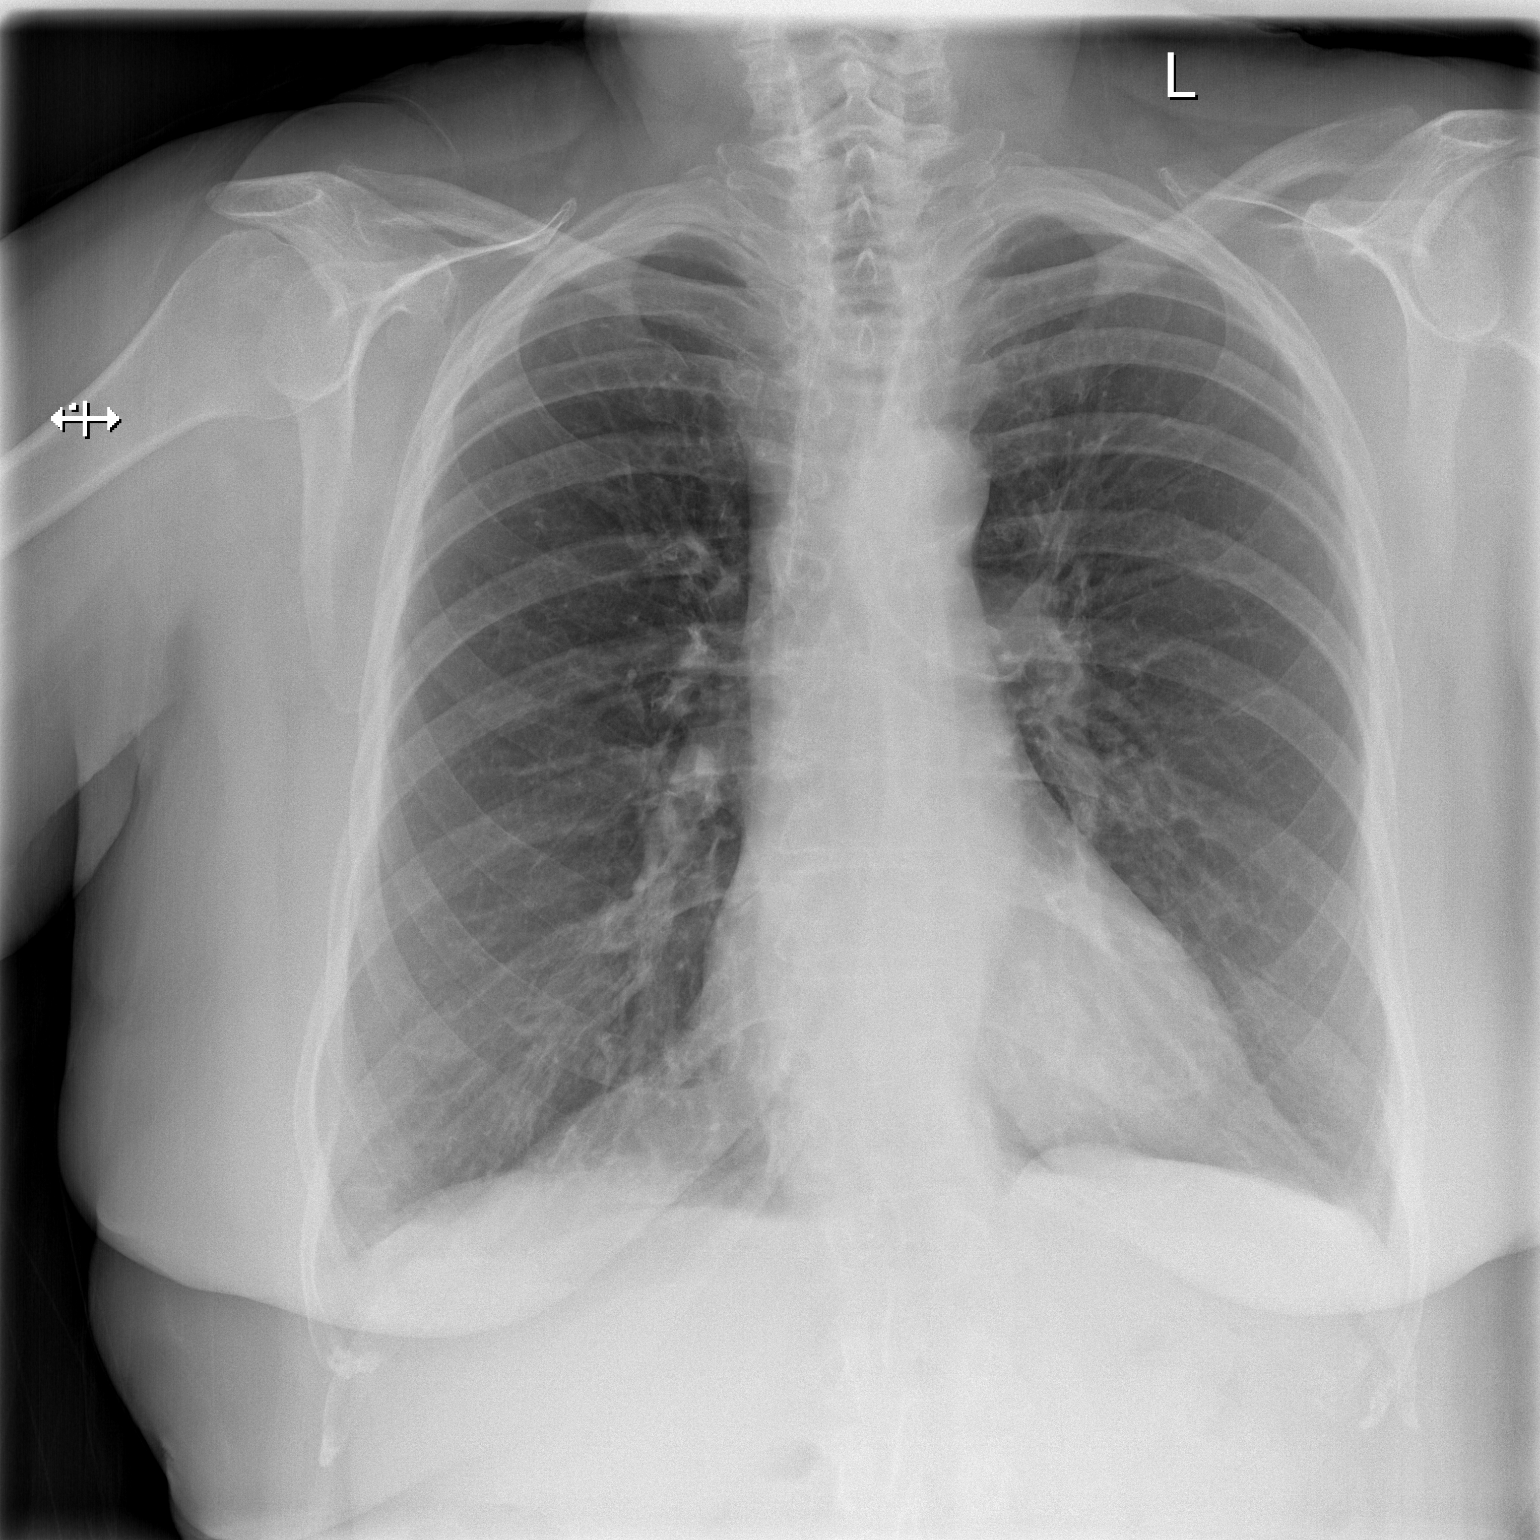

[w chest lat]
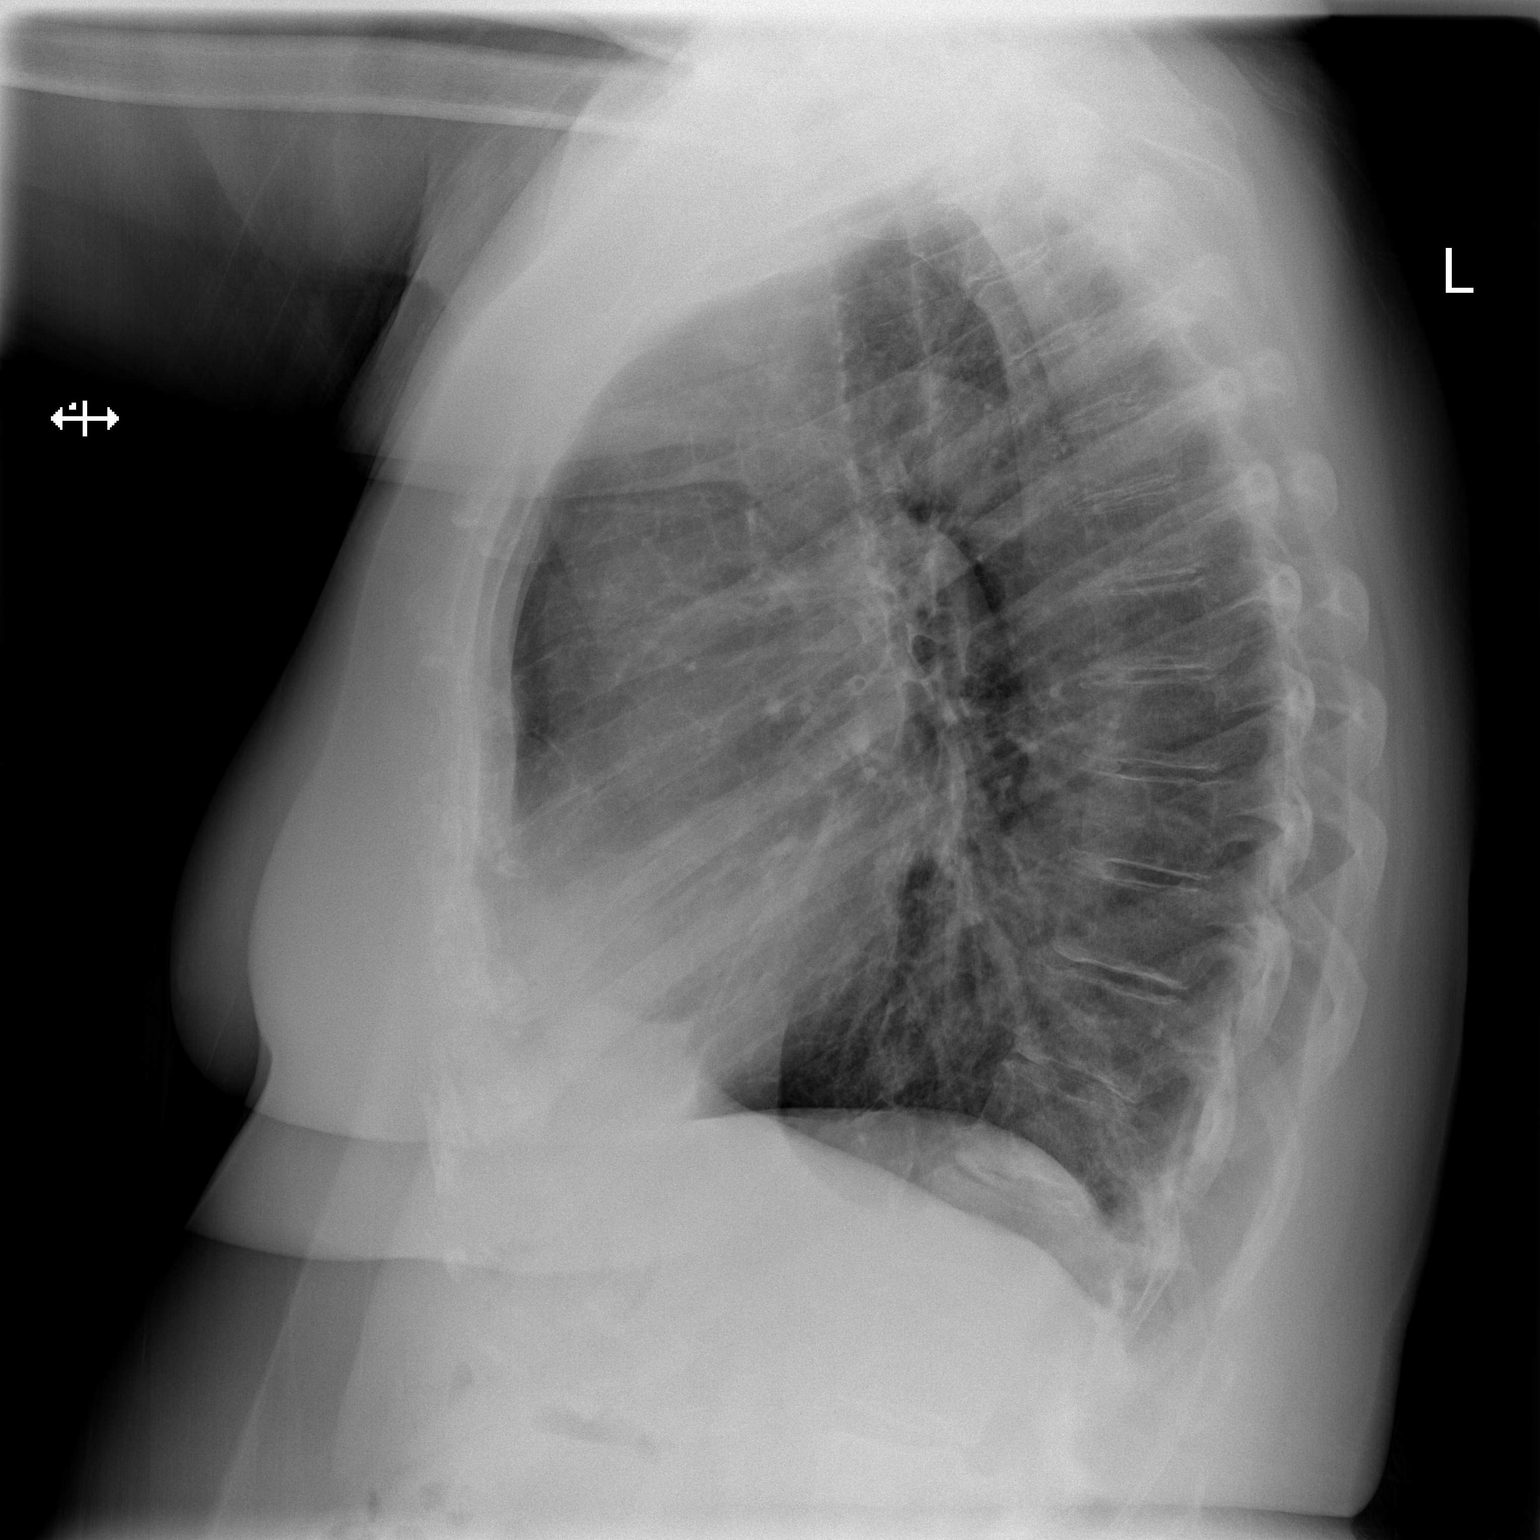

[2 of 2 positions shown; findings below may reference images not displayed]

FINDINGS: Grossly unchanged cardiac silhouette and mediastinal contours. The
lungs appear mildly hyperexpanded with mild diffuse slightly nodular
thickening of the pulmonary interstitium. Grossly unchanged
bibasilar heterogeneous opacities, right greater than left, likely
atelectasis or scar. No new focal airspace opacities. No pleural
effusion or pneumothorax. No evidence of edema. No acute osseus
abnormalities.
IMPRESSION: Mild lung hyperexpansion and bronchitic change without acute
cardiopulmonary disease. Specifically, no evidence of pneumonia.

## 2016-03-27 IMAGING — CT CT PARANASAL SINUSES LIMITED
1 series · 10 of 12 positions shown, 13 images · non-contrast
Comparison: 01/19/2007.

CLINICAL DATA: Asthma with rhinitis.

EXAM:
CT PARANASAL SINUS LIMITED WITHOUT CONTRAST
TECHNIQUE: Non-contiguous multidetector CT images of the paranasal sinuses were
obtained in a single plane without contrast.

[Series 6: coronal soft · axial · 0.30mm/px · z∈[-100,-10]mm · 10 of 12 slices shown, 13 images]
[im 2/12  mediastinal]
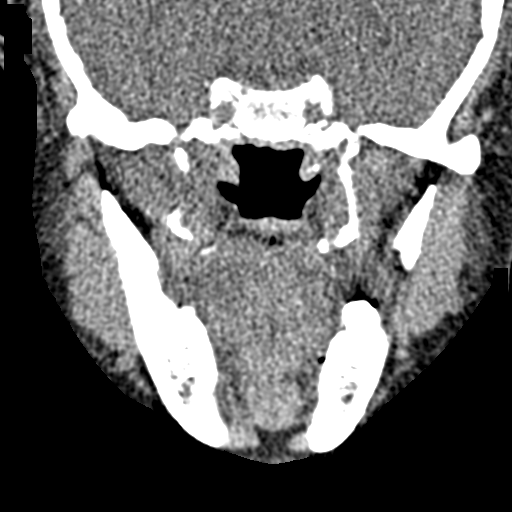
[im 2/12  bone]
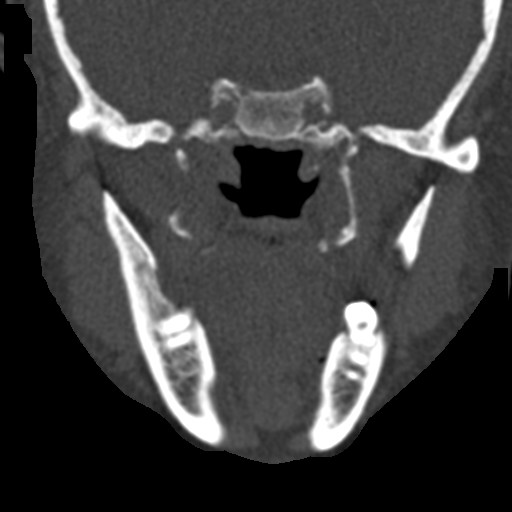
[im 3/12  bone]
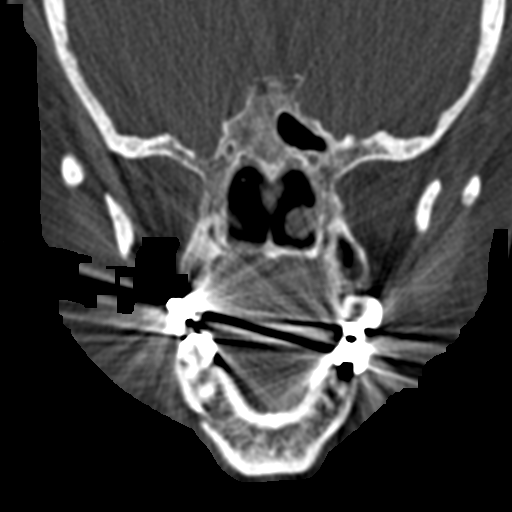
[im 4/12  bone]
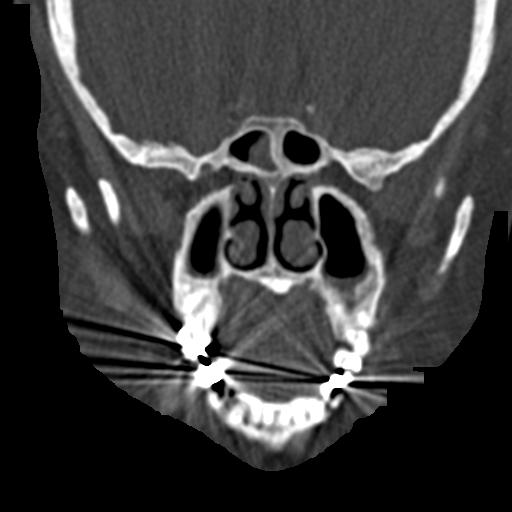
[im 5/12  bone]
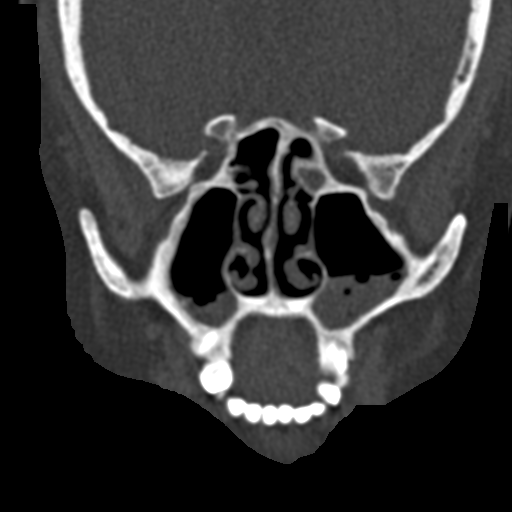
[im 6/12  mediastinal]
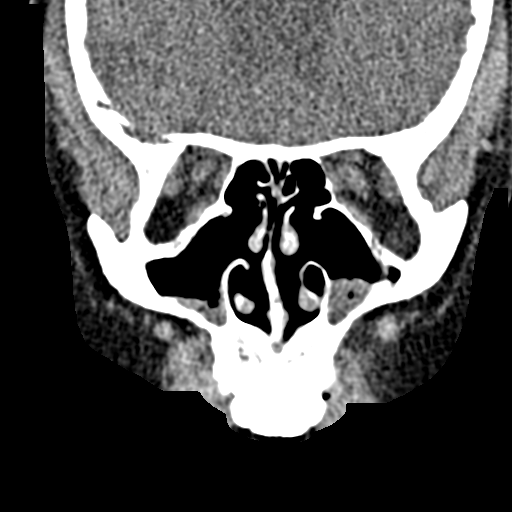
[im 6/12  bone]
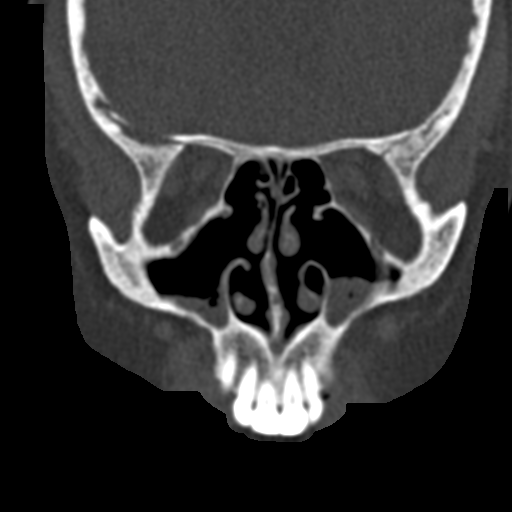
[im 7/12  bone]
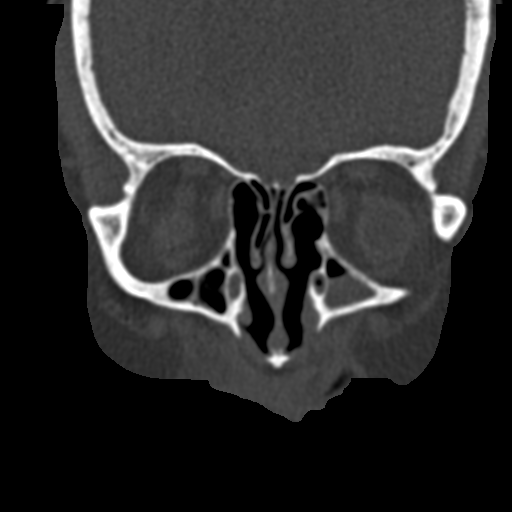
[im 8/12  bone]
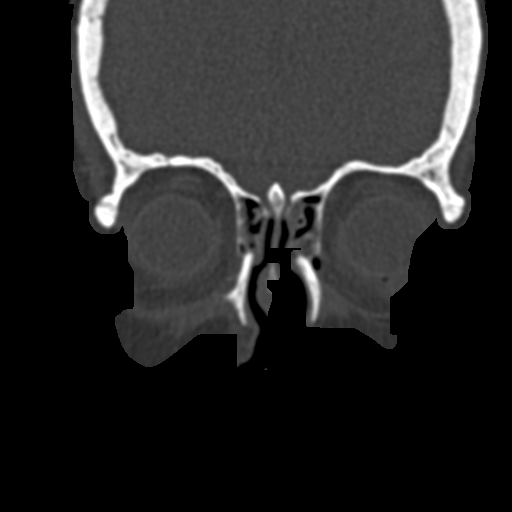
[im 9/12  bone]
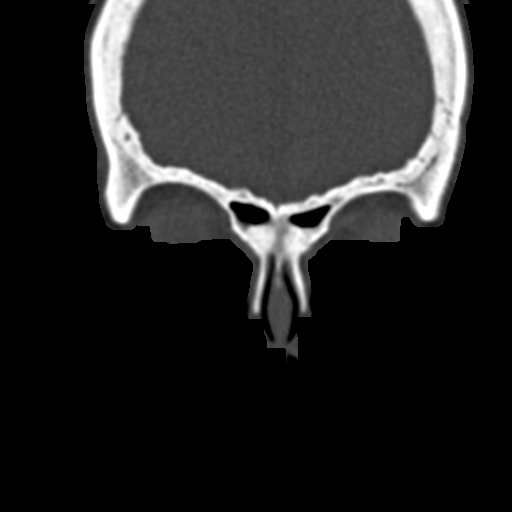
[im 10/12  mediastinal]
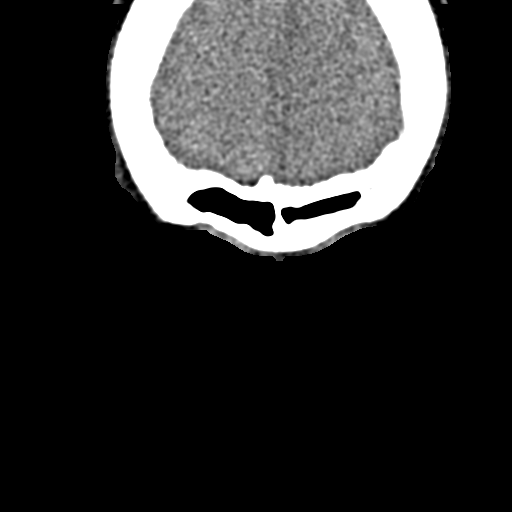
[im 10/12  bone]
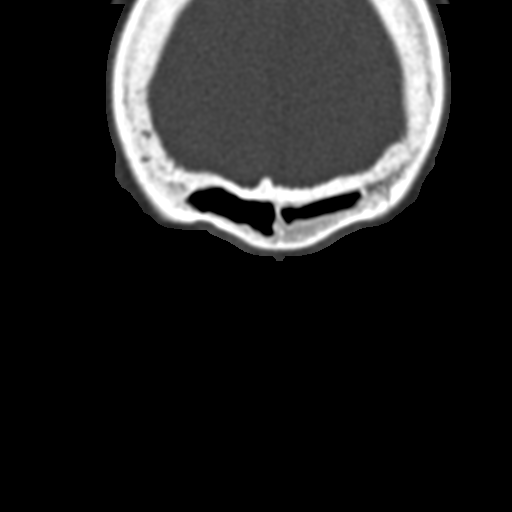
[im 11/12  bone]
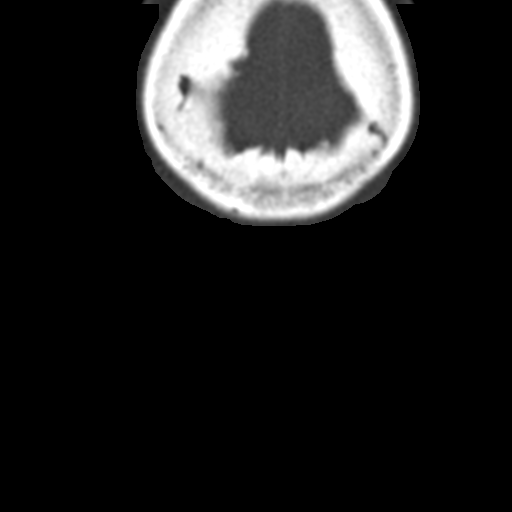

[10 of 12 positions shown; findings below may reference images not displayed]

FINDINGS: Postsurgical changes in the bilateral maxillary antra, which display
wide openings. Air-fluid levels with foamy secretions in the
maxillary sinuses are consistent with bilateral acute sinusitis.

Extensive bilateral anterior ethmoidectomies. Some persistent fluid
in a posterior ethmoid air cell on the right.

Frontal sinuses are hypoplastic but clear.

Retention cyst noted in the larger left division of the sphenoid
sinus.

Sigmoid shaped nasal septum. Diminutive appearing inferior
turbinates. No evidence for maxillary osteitis. Negative
intracranial compartment. Negative orbits.

Compared with the prior study, significant maxillary and ethmoid
sinusitis was present. Patient had not yet had surgery in 6111.
IMPRESSION: Acute bilateral maxillary sinusitis. Postsurgical changes as
described.

## 2016-03-29 ENCOUNTER — Other Ambulatory Visit: Payer: Self-pay | Admitting: *Deleted

## 2016-03-29 NOTE — Patient Outreach (Signed)
[facility]) Care Management  03/29/2016  [name] 11-Nov-1940 [id number]   Spoke with patient at length [facility]. She states she has a supportive son, who assists her with transportation, money management, shopping and meal prep. Patient accepted a brochure. Wants to discuss with son before signing up for services, she does know she will have home care services.   Spoke with patient son, Ed regarding [facility] services, answered multiple questions regarding [facility]. Patient son states they will have [facility] home [facility], in the past the patient has not been compliant at having home care services, he states she will not go to door and so Home care has to close. Son requesting that they [facility]. He does say they could benefit from our support.   Patient will discharge home today.   Plan to follow up with son later in week regarding [facility].   Alben SpittleMary E. Ezzie Senat, RN, BSN, CCM [facility] post acute care coordinator [phone]

## 2016-03-30 DIAGNOSIS — I110 Hypertensive heart disease with heart failure: Secondary | ICD-10-CM | POA: Diagnosis not present

## 2016-03-30 DIAGNOSIS — I5032 Chronic diastolic (congestive) heart failure: Secondary | ICD-10-CM | POA: Diagnosis not present

## 2016-03-31 DIAGNOSIS — I5032 Chronic diastolic (congestive) heart failure: Secondary | ICD-10-CM | POA: Diagnosis not present

## 2016-03-31 DIAGNOSIS — I110 Hypertensive heart disease with heart failure: Secondary | ICD-10-CM | POA: Diagnosis not present

## 2016-04-01 ENCOUNTER — Other Ambulatory Visit: Payer: Self-pay | Admitting: *Deleted

## 2016-04-01 DIAGNOSIS — I110 Hypertensive heart disease with heart failure: Secondary | ICD-10-CM | POA: Diagnosis not present

## 2016-04-01 DIAGNOSIS — I5032 Chronic diastolic (congestive) heart failure: Secondary | ICD-10-CM | POA: Diagnosis not present

## 2016-04-01 NOTE — Patient Outreach (Signed)
Call to son, Ed Cimler to follow up on patient discharge needs. Son [facility].  Son states at this time, no [facility] needs but will need home care aide in the Spring as he will be traveling out of town. Son given [facility] contact to check for private pay caregiver services for future needs. Also, [facility] will keep [facility] for future questions or concerns. Plan to sign off. Alben SpittleMary E. Albertha GheeNiemczura, RN, BSN, CCM  [facility] Post Acute care coordinator [phone]

## 2016-04-05 ENCOUNTER — Telehealth: Payer: Self-pay | Admitting: Emergency Medicine

## 2016-04-05 DIAGNOSIS — I5032 Chronic diastolic (congestive) heart failure: Secondary | ICD-10-CM | POA: Diagnosis not present

## 2016-04-05 DIAGNOSIS — I110 Hypertensive heart disease with heart failure: Secondary | ICD-10-CM | POA: Diagnosis not present

## 2016-04-05 NOTE — Telephone Encounter (Signed)
[name] [name] called and needs verbal orders for OT 1 time a wk for 2 wks for training in health promotion, safety, fall prevention, infection prevention and home program. Also verbal orders to discharge after goals are met. Please advise thanks.

## 2016-04-05 NOTE — Telephone Encounter (Signed)
Left detailed message with verbal okay per details below.

## 2016-04-06 DIAGNOSIS — I5032 Chronic diastolic (congestive) heart failure: Secondary | ICD-10-CM | POA: Diagnosis not present

## 2016-04-06 DIAGNOSIS — I110 Hypertensive heart disease with heart failure: Secondary | ICD-10-CM | POA: Diagnosis not present

## 2016-04-07 DIAGNOSIS — I5032 Chronic diastolic (congestive) heart failure: Secondary | ICD-10-CM | POA: Diagnosis not present

## 2016-04-07 DIAGNOSIS — I110 Hypertensive heart disease with heart failure: Secondary | ICD-10-CM | POA: Diagnosis not present

## 2016-04-08 DIAGNOSIS — I110 Hypertensive heart disease with heart failure: Secondary | ICD-10-CM | POA: Diagnosis not present

## 2016-04-08 DIAGNOSIS — I5032 Chronic diastolic (congestive) heart failure: Secondary | ICD-10-CM | POA: Diagnosis not present

## 2016-04-12 DIAGNOSIS — I5032 Chronic diastolic (congestive) heart failure: Secondary | ICD-10-CM | POA: Diagnosis not present

## 2016-04-12 DIAGNOSIS — I110 Hypertensive heart disease with heart failure: Secondary | ICD-10-CM | POA: Diagnosis not present

## 2016-04-13 DIAGNOSIS — I110 Hypertensive heart disease with heart failure: Secondary | ICD-10-CM | POA: Diagnosis not present

## 2016-04-13 DIAGNOSIS — I5032 Chronic diastolic (congestive) heart failure: Secondary | ICD-10-CM | POA: Diagnosis not present

## 2016-04-13 DIAGNOSIS — Z0289 Encounter for other administrative examinations: Secondary | ICD-10-CM | POA: Diagnosis not present

## 2016-04-14 DIAGNOSIS — I110 Hypertensive heart disease with heart failure: Secondary | ICD-10-CM | POA: Diagnosis not present

## 2016-04-14 DIAGNOSIS — I5032 Chronic diastolic (congestive) heart failure: Secondary | ICD-10-CM | POA: Diagnosis not present

## 2016-04-15 DIAGNOSIS — I5032 Chronic diastolic (congestive) heart failure: Secondary | ICD-10-CM | POA: Diagnosis not present

## 2016-04-15 DIAGNOSIS — I110 Hypertensive heart disease with heart failure: Secondary | ICD-10-CM | POA: Diagnosis not present

## 2016-04-21 DIAGNOSIS — I5032 Chronic diastolic (congestive) heart failure: Secondary | ICD-10-CM | POA: Diagnosis not present

## 2016-04-21 DIAGNOSIS — I110 Hypertensive heart disease with heart failure: Secondary | ICD-10-CM | POA: Diagnosis not present

## 2016-04-22 ENCOUNTER — Telehealth: Payer: Self-pay

## 2016-04-22 DIAGNOSIS — I110 Hypertensive heart disease with heart failure: Secondary | ICD-10-CM | POA: Diagnosis not present

## 2016-04-22 DIAGNOSIS — I5032 Chronic diastolic (congestive) heart failure: Secondary | ICD-10-CM | POA: Diagnosis not present

## 2016-04-22 NOTE — Telephone Encounter (Signed)
Erie NoeVanessa with HH called and requested verbal orders for pt to have a medical social worker come out an evaluate for services provided in the community.   Called Erie NoeVanessa back at [phone] with verbal okay.

## 2016-04-25 NOTE — Telephone Encounter (Signed)
Ok with me 

## 2016-04-26 DIAGNOSIS — I110 Hypertensive heart disease with heart failure: Secondary | ICD-10-CM | POA: Diagnosis not present

## 2016-04-26 DIAGNOSIS — I5032 Chronic diastolic (congestive) heart failure: Secondary | ICD-10-CM | POA: Diagnosis not present

## 2016-04-27 DIAGNOSIS — I5032 Chronic diastolic (congestive) heart failure: Secondary | ICD-10-CM | POA: Diagnosis not present

## 2016-04-27 DIAGNOSIS — I110 Hypertensive heart disease with heart failure: Secondary | ICD-10-CM | POA: Diagnosis not present

## 2016-04-28 DIAGNOSIS — I5032 Chronic diastolic (congestive) heart failure: Secondary | ICD-10-CM | POA: Diagnosis not present

## 2016-04-28 DIAGNOSIS — I110 Hypertensive heart disease with heart failure: Secondary | ICD-10-CM | POA: Diagnosis not present

## 2016-04-29 ENCOUNTER — Encounter: Payer: Self-pay | Admitting: *Deleted

## 2016-04-29 ENCOUNTER — Other Ambulatory Visit: Payer: Self-pay | Admitting: *Deleted

## 2016-04-29 DIAGNOSIS — I110 Hypertensive heart disease with heart failure: Secondary | ICD-10-CM | POA: Diagnosis not present

## 2016-04-29 DIAGNOSIS — J441 Chronic obstructive pulmonary disease with (acute) exacerbation: Secondary | ICD-10-CM

## 2016-04-29 DIAGNOSIS — I5032 Chronic diastolic (congestive) heart failure: Secondary | ICD-10-CM | POA: Diagnosis not present

## 2016-04-29 NOTE — Patient Outreach (Signed)
Call received from patient son [name] [name] who is also POA [phone]). Patient son would like to utilized [facility] services and is making a self referral.  Patient son verbalizes that he is upset with home care agency and needs [facility] program services [facility].   Son reports that the nurse had been filling med boxes and left an am/pm box for patient, but he does not have a list of medications or even if patient needs refills, he states he did get 30 day supply before leaving facility but does not know about refills. Son also reports that SW had spoken with him and he was upset with comments made about poor senior services in [place] and mentioned services in other states, in which patient does not reside nor can move to.    Son needs assistance with direction of possible assisted living facility placement for patient. He is very supportive of patient and does what he can but concerned patient may need higher level of care soon. He needs to know a direction to start.   Call to Vira Agarhristie Ray, with [facility], voiced patient son concerns, she will follow up with care team and will attempt to get more visits until [facility] get [facility].   Call to patient son and discussed that this RNCM had called home care regarding concerns and they would work to get more visits and will be contacting him regarding concerns.   Plan: Refer to [facility] LCSW for assistance on Assisted Living Facility placement needs [facility] to [facility] for medication management and education for son on medications so he can fill med box and understand medication. [facility] needed.  Alben SpittleMary E. Albertha GheeNiemczura, RN, BSN, CCM  [facility] Post Acute Care Coordinator [phone]

## 2016-05-02 ENCOUNTER — Other Ambulatory Visit: Payer: Self-pay | Admitting: Pharmacist

## 2016-05-02 DIAGNOSIS — I5032 Chronic diastolic (congestive) heart failure: Secondary | ICD-10-CM | POA: Diagnosis not present

## 2016-05-02 DIAGNOSIS — I110 Hypertensive heart disease with heart failure: Secondary | ICD-10-CM | POA: Diagnosis not present

## 2016-05-02 NOTE — Patient Outreach (Signed)
[facility]) Care Management  05/02/2016  Jackelyn PolingMargaret M Lozier-Arps 30-Nov-1940 [id number]  Called and spoke with Mrs. Erma PintoMargaret Lozier-Arps' POA/son Ed Cimler per referral from Verdie DrownMary Niemczura, [facility] RN, for medication management.   HIPAA identifiers were provided by Mr. Myrene GalasCimler.  Mr. Myrene GalasCimler reported that the patient has been without all of her medications for about two weeks and that he is very confused about what is actually in her pill box.  Mr. Myrene GalasCimler said a [facility] home care nurse originally filled the patient's pill box but has not been out since the initial visit about medications.  There is a very extensive note in the patient's chart by [facility] Nurse, Verdie DrownMary Niemczura that describes Mr. Jarold SongCimler's concerns with home health.    [facility] was called to obtain the discharge medication list from their agency. I spoke with Leeroy Bockhelsea at [facility] and she will fax over their discharge medication list.  Plan:  1.  Since Mr. Myrene GalasCimler was not at his mother's home at the time of the phone call and because he expressed concern about the patient's home medications, a home visit was scheduled for later this week.  2.  Once medications are properly reviewed, pill packaging will be explored.  Phone outreach was completed by [facility] PharmD, Laurelyn SickleKatina, whom does not yet have CH[facility] access and I am assisting her with her documentation.    Tommye StandardKevin Undrea Shipes, PharmD, [facility] Clinical Pharmacist [facility] [phone]

## 2016-05-03 ENCOUNTER — Encounter: Payer: Self-pay | Admitting: Neurology

## 2016-05-03 ENCOUNTER — Ambulatory Visit (INDEPENDENT_AMBULATORY_CARE_PROVIDER_SITE_OTHER): Payer: Medicare Other | Admitting: Neurology

## 2016-05-03 VITALS — BP 148/84 | HR 78 | Ht 68.0 in | Wt 210.4 lb

## 2016-05-03 DIAGNOSIS — G3184 Mild cognitive impairment, so stated: Secondary | ICD-10-CM

## 2016-05-03 MED ORDER — DONEPEZIL HCL 10 MG PO TABS: ORAL_TABLET | ORAL | 11 refills | Status: AC

## 2016-05-03 MED ORDER — DONEPEZIL HCL 5 MG PO TABS: ORAL_TABLET | ORAL | 0 refills | Status: AC

## 2016-05-03 NOTE — Patient Instructions (Addendum)
1. Start Aricept 5mg  once a day for 1 month, then increase to 10mg  daily 2. Control of blood pressure, cholesterol, as well as physical exercise and brain stimulation exercises are important for brain health 3. Follow-up in 6 months, call for any changes

## 2016-05-03 NOTE — Progress Notes (Signed)
NEUROLOGY CONSULTATION NOTE  [name] [name] MRN: [id number] DOB: 03/11/1941  Referring provider: Dr. Sanda Linger Primary care provider: Dr. Sanda Linger  Reason for consult:  Cognitive impairment  Dear Dr Yetta Barre:  Thank you for your kind referral of [name] [name] for consultation of the above symptoms. Although her history is well known to you, please allow me to reiterate it for the purpose of our medical record. The patient was accompanied to the clinic by her son who also provides collateral information. Records and images were personally reviewed where available.  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 She states that for years she has had trouble remembering names, but feels memory has not worsened. Her son noticed memory changes more significantly after she had a severe asthma attack in May 2017 with respiratory arrest. She drove one time and got lost. She has not driven since August 2017 (family did not renew car insurance). He has noticed little things such as loss of the concept of time, she would think something happened a year ago instead of many years ago, such as an argument with her mother like it was recent. He is unsure if she is taking her medications as prescribed, she states she remembers them, but she would run out of pills earlier. She has a pharmacist coming out tomorrow to put her pills in a blister pack. She used to have a pillbox but she does not think this helped. Her son reports there are medications listed on her list that she is not taking, she recalls Dr. Yetta Barre telling her she does not need them anymore. She does not cook, her son has been cooking for the past year. She denies leaving her stove on. Her husband passed away in 2016/11/29and he had been in  charge of bills, her son is now in charge. He denies any personality changes, no hallucinations. She endorses feeling slightly depressed. She was started on Prozac in the hospital but has not had it refilled.   She denies any frequent headaches, dizziness, diplopia, dysarthria/dysphagia, neck/back pain, focal numbness/tingling/weakness, anosmia, or tremors. Sleep is good. She has constipation. She has had 2 falls since SNF discharge last 24-Mar-2023. Her mother may have had dementia. She denies any history of head injuries, no alcohol use.   Laboratory Data: Lab Results  Component Value Date   WBC 7.9 03/12/2016   HGB 12.4 03/12/2016   HCT 37.7 03/12/2016   MCV 89.8 03/12/2016   PLT 262 03/12/2016     Chemistry      Component Value Date/Time   NA 139 03/10/2016 1206   K 3.5 03/10/2016 1206   CL 102 03/10/2016 1206   CO2 26 03/10/2016 1206   BUN 17 03/10/2016 1206   CREATININE 0.86 03/10/2016 1206      Component Value Date/Time   CALCIUM 9.3 03/10/2016 1206   ALKPHOS 79 03/10/2016 1206   AST 24 03/10/2016 1206   ALT 25 03/10/2016 1206   BILITOT 0.9 03/10/2016 1206     Lab Results  Component Value Date   TSH 4.78 (H) 02/24/2016   Lab Results  Component Value Date   VITAMINB12 530 12/01/2015    PAST MEDICAL HISTORY: Past Medical History:  Diagnosis Date  . Allergic rhinitis   . Aortic insufficiency   . Asthma   . Chronic anticoagulation  on coumadin  . Colon polyps   . Depression   . Diastolic dysfunction   . Falls frequently   . GERD (gastroesophageal reflux disease)   . HLD (hyperlipidemia)   . HTN (hypertension)   . Hypothyroidism   . Migraine, unspecified, without mention of intractable migraine without mention of status migrainosus   . Obesity   . Osteoarthritis   . PAF (paroxysmal atrial fibrillation) (HCC)    on coumadin and Cardiazem  . S/P cardiac cath 2003   no obstructive disease     PAST SURGICAL HISTORY: Past Surgical History:  Procedure  Laterality Date  . ABDOMINAL HYSTERECTOMY    . APPENDECTOMY    . REPLACEMENT TOTAL KNEE Right   . TUBAL LIGATION    . US ECHOCARDIOGRAPHY  2007    MEDICATIONS: Current Outpatient Prescriptions on File Prior to Visit  Medication Sig Dispense Refill  . albuterol (PROAIR HFA) 108 (90 Base) MCG/ACT inhaler Inhale 2 puffs into the lungs every 6 (six) hours as needed for wheezing or shortness of breath. 8.5 each 5  . albuterol (PROVENTIL) (2.5 MG/3ML) 0.083% nebulizer solution Take 2.5 mg by nebulization every 6 (six) hours as needed for wheezing or shortness of breath.    . budesonide-formoterol (SYMBICORT) 160-4.5 MCG/ACT inhaler Inhale 2 puffs into the lungs 2 (two) times daily. 1 Inhaler 11  . calcium carbonate (TUMS - DOSED IN MG ELEMENTAL CALCIUM) 500 MG chewable tablet Chew 1 tablet by mouth as needed for indigestion or heartburn.    . esomeprazole (NEXIUM) 40 MG capsule Take 1 capsule (40 mg total) by mouth 2 (two) times daily before a meal. 180 capsule 3  . FLUoxetine (PROZAC) 10 MG capsule Take 1 capsule (10 mg total) by mouth daily.    . fluticasone (FLONASE) 50 MCG/ACT nasal spray Place 2 sprays into both nostrils daily. (Patient taking differently: Place 2 sprays into both nostrils 2 (two) times daily. ) 48 g 1  . ibuprofen (ADVIL,MOTRIN) 200 MG tablet Take 400 mg by mouth every 6 (six) hours as needed for fever, headache, mild pain, moderate pain or cramping.    . influenza vac recom quadrivalent (FLUBLOK) 0.5 ML injection Inject 0.5 mLs into the muscle once.    Marland Kitchen levothyroxine (SYNTHROID, LEVOTHROID) 75 MCG tablet Take 1 tablet (75 mcg total) by mouth daily. 90 tablet 1  . loratadine (CLARITIN) 10 MG tablet Take 10 mg by mouth daily.    . montelukast (SINGULAIR) 10 MG tablet Take 1 tablet (10 mg total) by mouth at bedtime. 90 tablet 3  . nebivolol (BYSTOLIC) 5 MG tablet Take 1 tablet (5 mg total) by mouth daily. 90 tablet 3  . telmisartan (MICARDIS) 80 MG tablet Take 1 tablet (80 mg  total) by mouth daily. 90 tablet 3  . torsemide (DEMADEX) 10 MG tablet Take 1 tablet (10 mg total) by mouth 2 (two) times daily. 180 tablet 3   No current facility-administered medications on file prior to visit.     ALLERGIES: Allergies  Allergen Reactions  . Tetracycline Swelling    Swollen tongue  . Acetaminophen Other (See Comments)    ASTHMA MORE SEVERE  . Cardizem [Diltiazem Hcl] Swelling    edema  . Cefaclor Hives, Itching and Rash  . Cephalexin Hives, Itching and Rash  . Clindamycin Hives, Itching and Rash  . Crestor [Rosuvastatin] Other (See Comments)    Muscle aches  . Ibuprofen Other (See Comments)    MAKES ASTHMA WORSE  . Nsaids Other (See Comments)  Respiratory-Asthma WORSE  . Penicillins Hives    Has patient had a PCN reaction causing immediate rash, facial/tongue/throat swelling, SOB or lightheadedness with hypotension: Yes Has patient had a PCN reaction causing severe rash involving mucus membranes or skin necrosis: No Has patient had a PCN reaction that required hospitalization No Has patient had a PCN reaction occurring within the last 10 years: No If all of the above answers are "NO", then may proceed with Cephalosporin use.   . Pravastatin Sodium Other (See Comments)    MYALGIAS  . Simvastatin Other (See Comments)    MYALGIAS  . Sulfonamide Derivatives Hives, Itching and Rash  . Levaquin [Levofloxacin In D5w] Rash  . Lisinopril Cough    FAMILY HISTORY: Family History  Problem Relation Age of Onset  . Heart disease    . Hypertension    . COPD    . Crohn's disease    . Diabetes    . Depression    . Dementia      SOCIAL HISTORY: Social History   Social History  . Marital status: Widowed    Spouse name: N/A  . Number of children: 3  . Years of education: N/A   Occupational History  . Retired Charity fundraiser    Social History Main Topics  . Smoking status: Never Smoker  . Smokeless tobacco: Never Used  . Alcohol use 3.0 oz/week    5 Glasses of  wine per week  . Drug use: No  . Sexual activity: Not Currently   Other Topics Concern  . Not on file   Social History Narrative   Lives alone. Mostly sedentary. Doesn't drive. Son does all the cooking and iADLs.    REVIEW OF SYSTEMS: Constitutional: No fevers, chills, or sweats, no generalized fatigue, change in appetite Eyes: No visual changes, double vision, eye pain Ear, nose and throat: No hearing loss, ear pain, nasal congestion, sore throat Cardiovascular: No chest pain, palpitations Respiratory:  No shortness of breath at rest or with exertion, wheezes GastrointestinaI: No nausea, vomiting, diarrhea, abdominal pain, fecal incontinence Genitourinary:  No dysuria, urinary retention or frequency Musculoskeletal:  No neck pain, back pain Integumentary: No rash, pruritus, skin lesions Neurological: as above Psychiatric: No depression, insomnia, anxiety Endocrine: No palpitations, fatigue, diaphoresis, mood swings, change in appetite, change in weight, increased thirst Hematologic/Lymphatic:  No anemia, purpura, petechiae. Allergic/Immunologic: no itchy/runny eyes, nasal congestion, recent allergic reactions, rashes  PHYSICAL EXAM: Vitals:   05/03/16 1052  BP: (!) 148/84  Pulse: 78   General: No acute distress Head:  Normocephalic/atraumatic Eyes: Fundoscopic exam shows bilateral sharp discs, no vessel changes, exudates, or hemorrhages Neck: supple, no paraspinal tenderness, full range of motion Back: No paraspinal tenderness Heart: regular rate and rhythm Lungs: Clear to auscultation bilaterally. Vascular: No carotid bruits. Skin/Extremities: No rash, no edema Neurological Exam: Mental status: alert and oriented to person, place, and month/year, no dysarthria or aphasia, Fund of knowledge is appropriate.  Remote memory intact.  Attention and concentration are normal.    Able to name objects and repeat phrases.  Montreal Cognitive Assessment  05/03/2016  Visuospatial/  Executive (0/5) 2  Naming (0/3) 3  Attention: Read list of digits (0/2) 2  Attention: Read list of letters (0/1) 1  Attention: Serial 7 subtraction starting at 100 (0/3) 3  Language: Repeat phrase (0/2) 2  Language : Fluency (0/1) 1  Abstraction (0/2) 2  Delayed Recall (0/5) 2  Orientation (0/6) 4  Total 22   Cranial nerves: CN I: not  tested CN II: pupils equal, round and reactive to light, visual fields intact, fundi unremarkable. CN III, IV, VI:  full range of motion, no nystagmus, no ptosis CN V: facial sensation intact CN VII: upper and lower face symmetric CN VIII: hearing intact to finger rub CN IX, X: gag intact, uvula midline CN XI: sternocleidomastoid and trapezius muscles intact CN XII: tongue midline Bulk & Tone: normal, no fasciculations. Motor: 5/5 throughout with no pronator drift. Sensation: intact to light touch, cold, pin, vibration and joint position sense.  No extinction to double simultaneous stimulation.  Romberg test negative Deep Tendon Reflexes: +2 throughout, no ankle clonus Plantar responses: downgoing bilaterally Cerebellar: no incoordination on finger to nose, heel to shin. No dysdiadochokinesia Gait: narrow-based and steady, able to tandem walk adequately. 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 Her MOCA score today is 22/30, indicating mild cognitive impairment, however history suggestive of possible mild dementia. She is not driving anymore. We discussed she may benefit from starting cholinesterase inhibitors such as Aricept, side effects and expectations from the medications were discussed. She will start Aricept 5mg  daily for 1 month, then increase to 10mg  daily. We also discussed effects of mood on memory, she endorses slight depression and had previously  been on Prozac from hospital discharge. Her son will discuss this with her PCP. We discussed home safety and the importance of medication compliance, she has a pharmacist coming to help arrange medications in a blister pack. We discussed the importance of control of vascular risk factors, physical exercise, and brain stimulation exercises for brain health. She will follow-up in 6 months and knows to call for any changes.   Thank you for allowing me to participate in the care of this patient. Please do not hesitate to call for any questions or concerns.   Patrcia DollyKaren Aquino, M.D.  CC: Dr. Yetta BarreJones

## 2016-05-04 ENCOUNTER — Other Ambulatory Visit: Payer: Self-pay | Admitting: Internal Medicine

## 2016-05-04 ENCOUNTER — Other Ambulatory Visit: Payer: Self-pay | Admitting: Pharmacist

## 2016-05-04 ENCOUNTER — Other Ambulatory Visit: Payer: Self-pay | Admitting: *Deleted

## 2016-05-04 ENCOUNTER — Telehealth: Payer: Self-pay

## 2016-05-04 ENCOUNTER — Encounter: Payer: Self-pay | Admitting: *Deleted

## 2016-05-04 ENCOUNTER — Telehealth: Payer: Self-pay | Admitting: Internal Medicine

## 2016-05-04 DIAGNOSIS — F329 Major depressive disorder, single episode, unspecified: Secondary | ICD-10-CM

## 2016-05-04 DIAGNOSIS — I110 Hypertensive heart disease with heart failure: Secondary | ICD-10-CM | POA: Diagnosis not present

## 2016-05-04 DIAGNOSIS — K2100 Gastro-esophageal reflux disease with esophagitis, without bleeding: Secondary | ICD-10-CM

## 2016-05-04 DIAGNOSIS — I5032 Chronic diastolic (congestive) heart failure: Secondary | ICD-10-CM | POA: Diagnosis not present

## 2016-05-04 DIAGNOSIS — I10 Essential (primary) hypertension: Secondary | ICD-10-CM

## 2016-05-04 DIAGNOSIS — E038 Other specified hypothyroidism: Secondary | ICD-10-CM

## 2016-05-04 DIAGNOSIS — J4520 Mild intermittent asthma, uncomplicated: Secondary | ICD-10-CM

## 2016-05-04 DIAGNOSIS — I5189 Other ill-defined heart diseases: Secondary | ICD-10-CM

## 2016-05-04 MED ORDER — FLUOXETINE HCL 10 MG PO CAPS: 10.0000 mg | ORAL_CAPSULE | Freq: Every day | ORAL | 3 refills | Status: AC

## 2016-05-04 NOTE — Telephone Encounter (Signed)
Pt dc'ed from SNF and need and rx for micardis, bystolic and prozac.  BP was normal at Neurology and she has not had the medication listed in 2 weeks.   Rq to change phramacy to [facility] because they do blister packets for the patient.   Please advise

## 2016-05-04 NOTE — Telephone Encounter (Signed)
Did home visit with patient.  She is missing three meds.  Patient is out of all her cardiac meds.  Patient does not have bystolic, micardis, and prozac.  Would like [facility] along with all her other meds to have put in blister packs.  Please follow up in regard.

## 2016-05-04 NOTE — Patient Outreach (Signed)
[facility]) Care Management  05/04/2016  Jackelyn PolingMargaret M Lozier-Arps 15-Feb-1941 [id number]   CSW was able to make initial contact with patient's son, as well as patient's HealthCare Power of Gerrit Friendsttorney, Rush Farmerdward Cimler today to perform the phone assessment on patient, as well as assess and assist with social work needs and services.  CSW introduced self, explained role and types of services provided through [facility] Care Management).  CSW further explained to Mr. Myrene GalasCimler that CSW works with patient's Pharmacist, Tommye StandardKevin Ruedinger, also with [facility]. CSW then explained the reason for the call, indicating that Mr. Inda CokeRuedinger thought that patient and Mr. Myrene GalasCimler would benefit from social work services and resources to assist with possible long-term care placement for patient in an assisted living facility.  CSW obtained two HIPAA compliant identifiers from patient, which included patient's name and date of birth. Mr. Myrene GalasCimler voiced being very impressed with pharmacy services, as Mr. Inda CokeRuedinger was meeting with patient and Mr. Myrene GalasCimler at the time of CSW's initial call.  Mr. Myrene GalasCimler stated, "I sure wish we had known about them two years ago, they were a God-send".  Mr. Myrene GalasCimler further reported that he feels confident that Mr. Inda CokeRuedinger was finally able to get all of patient's prescription medications organized.  Mr. Myrene GalasCimler was unsure of the appropriate questions to ask CSW, as he reported, "I'm somewhat at a loss".  Mr. Myrene GalasCimler indicated that he is beginning to notice a significant increase in patient's level of confusion, and fears that patient will soon be unable to perform activities of daily living independently.  Mr. Myrene GalasCimler realizes that he needs to assist patient with downsizing, as patient is currently living alone in a 2800-3200 square foot house that he is having to maintain, because patient is not capable. Mr. Myrene GalasCimler had thought about placing patient in a  retirement community, but is now thinking that patient may require a higher level of care, such as an assisted living facility, or even an intermediate level of care.  CSW agreed to email ([email or web address]) Mr. Myrene GalasCimler a list of assisted [place] facilities [place] for his review.  In the meantime, Mr. Myrene GalasCimler plans to speak with patient's attorney to discuss patient's assets and whether or not patient will have enough money, between her assets, retirement and social security income to afford assisted living placement.  Mr. Myrene GalasCimler stated, "I'm certainly not looking to do anything anytime soon, I still need to clean out mom's home and make renovations before it can go on the market".  CSW explained to Mr. Myrene GalasCimler that CSW will be available to answer questions, as well as offer assistance, as needed.  Mr. Myrene GalasCimler took down CSW's contact information and agreed to contact CSW directly after he has had an opportunity to speak with patient's attorney.  CSW will make arrangements to contact Mr. Myrene GalasCimler on Thursday, January 18th, if CSW does not receive a return call from Mr. Myrene GalasCimler in the meantime. Danford BadJoanna Shailene Demonbreun, BSW, MSW, LCSW  Licensed Restaurant manager, fast foodClinical Social Worker  [facility] [place] [facility]  M[place], [place], [place] [address] Physical Address-[address], [place], [place] [address] Toll Free Main # [phone] Fax # [phone] Cell # [phone]  Fax # [phone]  [email or web address]

## 2016-05-04 NOTE — Telephone Encounter (Signed)
prozac RX sent It sounds like she does not need the 2 BP meds

## 2016-05-04 NOTE — Patient Outreach (Signed)
[facility]) Care Management  [facility]   05/04/2016  [name] [name] [birth year] [id number]  Subjective:  Home visit was completed with the patient and her son ([name]).  HIPAA identifiers were provided.  Purpose of the visit was medication management.  Patient requested her son be present during home visit.    Patient's PMH includes but not limited to:  GERD, type 2 diabetes, COPD, Afib, hypertension, hypothyroidism, cognitive impairment, depression and osteoarthritis.  She was hospitalized 03/10/16 and was discharged to a skilled nursing facility. Patient has been home from skilled nursing facility around thirty days.  Objective:   Encounter Medications: Outpatient Encounter Prescriptions as of 05/04/2016  Medication Sig  . albuterol (PROAIR HFA) 108 (90 Base) MCG/ACT inhaler Inhale 2 puffs into the lungs every 6 (six) hours as needed for wheezing or shortness of breath.  Marland Kitchen. albuterol (PROVENTIL) (2.5 MG/3ML) 0.083% nebulizer solution Take 2.5 mg by nebulization every 6 (six) hours as needed for wheezing or shortness of breath.  . budesonide-formoterol (SYMBICORT) 160-4.5 MCG/ACT inhaler Inhale 2 puffs into the lungs 2 (two) times daily. (Patient not taking: Reported on 05/03/2016)  . calcium carbonate (TUMS - DOSED IN MG ELEMENTAL CALCIUM) 500 MG chewable tablet Chew 1 tablet by mouth as needed for indigestion or heartburn.  . donepezil (ARICEPT) 10 MG tablet Take 1 tablet daily (start one month after taking Aricept 5mg  dose)  . donepezil (ARICEPT) 5 MG tablet Take 1 tablet daily for 1 month, then increase to 10mg  tablet daily  . esomeprazole (NEXIUM) 40 MG capsule Take 1 capsule (40 mg total) by mouth 2 (two) times daily before a meal.  . FLUoxetine (PROZAC) 10 MG capsule Take 1 capsule (10 mg total) by mouth daily. (Patient not taking: Reported on 05/03/2016)  . fluticasone (FLONASE) 50 MCG/ACT nasal spray Place 2 sprays into both nostrils daily. (Patient taking  differently: Place 2 sprays into both nostrils 2 (two) times daily. )  . ibuprofen (ADVIL,MOTRIN) 200 MG tablet Take 400 mg by mouth every 6 (six) hours as needed for fever, headache, mild pain, moderate pain or cramping.  . influenza vac recom quadrivalent (FLUBLOK) 0.5 ML injection Inject 0.5 mLs into the muscle once.  Marland Kitchen. levothyroxine (SYNTHROID, LEVOTHROID) 75 MCG tablet Take 1 tablet (75 mcg total) by mouth daily.  Marland Kitchen. loratadine (CLARITIN) 10 MG tablet Take 10 mg by mouth daily.  . montelukast (SINGULAIR) 10 MG tablet Take 1 tablet (10 mg total) by mouth at bedtime.  . nebivolol (BYSTOLIC) 5 MG tablet Take 1 tablet (5 mg total) by mouth daily. (Patient not taking: Reported on 05/03/2016)  . telmisartan (MICARDIS) 80 MG tablet Take 1 tablet (80 mg total) by mouth daily. (Patient not taking: Reported on 05/03/2016)  . torsemide (DEMADEX) 10 MG tablet Take 1 tablet (10 mg total) by mouth 2 (two) times daily.   No facility-administered encounter medications on file as of 05/04/2016.     Functional Status: In your present state of health, do you have any difficulty performing the following activities: 03/10/2016 10/09/2015  Hearing? N N  Vision? N N  Difficulty concentrating or making decisions? Malvin JohnsY Y  Walking or climbing stairs? Y Y  Dressing or bathing? N Y  Doing errands, shopping? Y N  Preparing Food and eating ? - -  Using the Toilet? - -  In the past six months, have you accidently leaked urine? - -  Do you have problems with loss of bowel control? - -  Managing your  Medications? - -  Managing your Finances? - -  Housekeeping or managing your Housekeeping? - -  Some recent data might be hidden    Fall/Depression Screening: PHQ 2/9 Scores 07/15/2015 06/19/2015 04/06/2015 02/06/2015 04/12/2013  PHQ - 2 Score 0 0 0 3 0  PHQ- 9 Score - - - 7 -    Assessment: Medications were reconciled by comparing the discharge summary from SNF, the medication list from home health, medication list in  the patient's chart and via patient interview with actual medication bottles.  Drugs sorted by system:  Neurologic/Psychologic: Fluoxetine (patient reported out of this for 2 weeks) Donepezil (recently prescribed by Neurology--not filled yet)  Cardiovascular: Bystolic 5mg  ( did not have, patient reported out of for 2 weeks) telmisartan 80mg  (patient reported out of for 2 weeks) Torsemide 10mg   Pulmonary/Allergy: Symbicort  albuterol inhaler albuterol solution Fluticasone Loratadine montelukast  Gastrointestinal: Esomeprazole  Endocrine: Levothyroxine  Pain:  Ibuprofen  Vitamins/Minerals: Tums(Calcium Carbonate)  Miscellaneous: Saline nasal spray  Lubricant eye drops  Duplications in therapy:  1.  Alavert and generic loratadine (both are loratadine)- patient was taking both. She was counseled to only take one product and the products were grouped together to help decrease confusion.  2.  Patient had about five Torsemide 20mg  tablets in the same bottle with her torsemide 10 mg tablets. The tablets were separated and discarded by the patient's son.  Other issues noted:  Adherence  1.  Patient did not have fluoxetine, telmisartan, or Bystolic in her possession.    2.  Patient's son requested information about pill packaging to help with compliance. Two local pharmacies were called. The first pharmacy did not offer pill packaging but the second pharmacy did. The patient's son decided to go with the second pharmacy [facility] and declined having more area pharmacies called.  In addition to calling area pharmacies, the patient's Medicare Part D plan was researched for local preferred and standard pharmacies.  [facility] (Per patient insurance, Karin Golden, [address] and [facility] are "preferred pharmacies").  Appointment with PCP Patient does not have an upcoming appointment scheduled with her  PCP. She and her son were encouraged to call Dr. Yetta Barre' office and make an appointment as soon as possible.  Plan:  1.  Called Dr. Yetta Barre' office to clarify necessity/dosing of missing medications (telmisartan, fluoxetine, Bystolic) and to request that all prescriptions in the patient's profile be sent to [facility] for blister packaging.  Spoke with Dr. Yetta Barre' CMA, Judeth Cornfield and discussed --Ms. [name]' pulse was 78 yesterday at the Neurologist's office without medications.    2.  Will route note to Dr. Yetta Barre.  3.  Will continue to follow-up with patient/son to facilitate blister packaging of maintenance medications.    Tommye Standard, PharmD, [facility] Clinical Pharmacist [facility] [phone]  Patient home visit was completed by [facility] PharmD, Laurelyn Sickle.  She does not have CHL access at this time and I am assisting her with her documentation.  I was present during the home visit with Laurelyn Sickle and patient was discussed between myself and [place].

## 2016-05-05 ENCOUNTER — Other Ambulatory Visit: Payer: Self-pay | Admitting: Pharmacist

## 2016-05-05 MED ORDER — BUDESONIDE-FORMOTEROL FUMARATE 160-4.5 MCG/ACT IN AERO: 2.0000 | INHALATION_SPRAY | Freq: Two times a day (BID) | RESPIRATORY_TRACT | 11 refills | Status: AC

## 2016-05-05 MED ORDER — ESOMEPRAZOLE MAGNESIUM 40 MG PO CPDR: 40.0000 mg | DELAYED_RELEASE_CAPSULE | Freq: Two times a day (BID) | ORAL | 3 refills | Status: AC

## 2016-05-05 MED ORDER — LEVOTHYROXINE SODIUM 75 MCG PO TABS: 75.0000 ug | ORAL_TABLET | Freq: Every day | ORAL | 1 refills | Status: AC

## 2016-05-05 MED ORDER — ALBUTEROL SULFATE HFA 108 (90 BASE) MCG/ACT IN AERS: 2.0000 | INHALATION_SPRAY | Freq: Four times a day (QID) | RESPIRATORY_TRACT | 5 refills | Status: AC | PRN

## 2016-05-05 MED ORDER — TORSEMIDE 10 MG PO TABS: 10.0000 mg | ORAL_TABLET | Freq: Two times a day (BID) | ORAL | 3 refills | Status: DC

## 2016-05-05 NOTE — Telephone Encounter (Signed)
[facility] 2 [facility].   I have been able to contact the patients son to confirm that he wanted to have [facility].   Son also stated understanding that 2 BP meds have been discontinued.   Son will contact me if there are additional needs for his mother.   Received another phone call from [place] VernonKatina before I was able to get off of the phone with Ramon DredgeEdward.  Contact Katina and informed her of all of the above and thanked her for being so diligent with her calls.

## 2016-05-05 NOTE — Patient Outreach (Signed)
[facility]) Care Management  05/05/2016  [name] 05-06-1940 [id number]   Made several phone calls today to help facilitate patient's medications being filled in blister packaging.    Spoke to Dr. Yetta BarreJones' CMA, Judeth CornfieldStephanie who [facility] as [facility].   [facility] [facility] and spoke with a technician named, [name] [name] in reference to patient. She said it would take twenty four hours to complete the request.  Spoke to the patient's son, [name] [name] and HIPAA identifiers were confirmed. He will take the patient's Medicare Part D card and the hard copy donepezil [facility] today and go back and get all of the pill packs on a later date.  Plan: 1.  Will follow up with patient/son next week about pill packaging.  2.  Will consider closing case once patient has received pill packaging if she or her son do not have any other medication concerns.  Tommye StandardKevin Pasty Manninen, PharmD, [facility] Clinical Pharmacist [facility] [phone]  Phone outreach was completed by [facility] Pharm[facility], Laurelyn SickleKatina, whom does not yet have CHL access and I am assisting her with her documentation.  Patient was discussed between myself and [place].

## 2016-05-11 ENCOUNTER — Ambulatory Visit: Payer: Self-pay | Admitting: Pharmacist

## 2016-05-12 ENCOUNTER — Encounter: Payer: Self-pay | Admitting: Neurology

## 2016-05-12 ENCOUNTER — Other Ambulatory Visit: Payer: Self-pay | Admitting: *Deleted

## 2016-05-12 DIAGNOSIS — G3184 Mild cognitive impairment, so stated: Secondary | ICD-10-CM | POA: Insufficient documentation

## 2016-05-12 NOTE — Patient Outreach (Signed)
[facility]) Care Management  05/12/2016  Jackelyn PolingMargaret M Lozier-Arps [birth year] [id number]  CSW was able to make contact with patient's son, Rush Farmerdward Cimler today to follow-up regarding social work services and resources for patient, as well as ensure that Mr. [name] [name] received the packet of resource information securely emailed to his account.  Mr. [name] [name] admitted to receiving the resource information but denied having had an opportunity to review it's contents.  Mr. [name] [name] went on to say that he was able to meet with patient's attorney to discuss her Will and Estate.  Mr. [name] [name] was pleased to announce that patient still has a trust fund, that has been set up under his name, which he hopes to use to help pay for renovations and repairs to her existing home so that he can get it on the market to sale, as soon as possible.  Mr. 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 Mr. [name] [name] was most appreciative of the call, but reported, "I really haven't had an opportunity to think about next steps".  Mr. [name] [name] further reported, "I know I'm going to need your help, but can you give me a little more time to pull all my resources together so that I at least know where I need to start".  CSW voiced understanding, inquiring as to when it would be most convenient for CSW to call Mr. [name] [name] to check back in with him.  Mr. [name] [name] indicated that he felt like he just needed one more week; therefore, CSW agreed to contact him on Friday, January 26th.  However, CSW was able to ensure that Mr. [name] [name] has CSW's contact information and he was encouraged to contact CSW directly if social work services and/or assistance is needed in the meantime. Danford BadJoanna Bern Fare, BSW, MSW, LCSW  Licensed Restaurant manager, fast foodClinical Social Worker  [facility] Care Management [facility]  Mailing [address], [place], [place] [address] Physical Address-[address], [place], [place]  [address] Toll Free Main # [phone] Fax # [phone] Cell # [phone]  Fax # [phone]  [email or web address]

## 2016-05-13 DIAGNOSIS — I110 Hypertensive heart disease with heart failure: Secondary | ICD-10-CM | POA: Diagnosis not present

## 2016-05-13 DIAGNOSIS — I5032 Chronic diastolic (congestive) heart failure: Secondary | ICD-10-CM | POA: Diagnosis not present

## 2016-05-16 ENCOUNTER — Telehealth: Payer: Self-pay

## 2016-05-16 ENCOUNTER — Other Ambulatory Visit: Payer: Self-pay | Admitting: Pharmacist

## 2016-05-16 NOTE — Patient Outreach (Signed)
[facility]N) Care Management  05/16/2016  [name] [name] 11/30/1940 [id number]   Patient's son ([name] [name]) was called to follow up on blister packaging.  HIPAA identifiers were provided. Mr. [name] [name] confirmed patient got all of her medications [facility] and has been able to take her medications as prescribed. He mentioned the patient confused her rescue/controller inhalers last week but he put some tape on each inhaler to remind her and the problem has resolved.    Mr. [name] [name] did not report any other issues with patient's medications and was very pleased with [facility].   Plan Pharmacy case will be closed. Patient and her son have my number if there are any other medication related questions or concerns.   Beecher McardleKatina J. [name] [name], PharmD, BCACP [facility] Clinical Pharmacist  [phone]

## 2016-05-16 NOTE — Telephone Encounter (Signed)
Vanessa lvmom rq verbal for OT and speech therapy services.   Called Erie NoeVanessa back at [phone] and gave verbal okay for rq'ed services. Pt is up for re-certification.

## 2016-05-17 DIAGNOSIS — I5032 Chronic diastolic (congestive) heart failure: Secondary | ICD-10-CM | POA: Diagnosis not present

## 2016-05-17 DIAGNOSIS — I110 Hypertensive heart disease with heart failure: Secondary | ICD-10-CM | POA: Diagnosis not present

## 2016-05-20 ENCOUNTER — Encounter: Payer: Self-pay | Admitting: *Deleted

## 2016-05-20 ENCOUNTER — Other Ambulatory Visit: Payer: Self-pay | Admitting: *Deleted

## 2016-05-20 NOTE — Patient Outreach (Signed)
[facility]) Care Management  05/20/2016  Tonia Avino Lozier-Arps 05/21/1940 [id number]   CSW was able to make contact with patient's son, Mauri Pole today to follow-up regarding social work services and resources for patient.  Mr. Wallie Char admitted that he has made quite a bit of progress toward aligning he and patient's goals, just within the last week.  Mr. Wallie Char went on to say that he has met with patient's attorney several times, has secured her assets, and has begin the Long-Term Care Medicaid application process.  Further, Mr. Wallie Char is in the process of getting patient's home renovated and ready to put on the market to sale, as well as tour various assisted living facilities of choice.  Mr. Wallie Char admitted that things are finally starting to fall into place for him and that the process is not quite a grueling as he once thought.  CSW inquired as to how CSW could be of assistance to Mr. Wallie Char at this time.  Mr. Wallie Char denied being able to identify any additional social work needs at present, but reported, "I have your number and I know who to call if I need help".  Mr. Wallie Char was very appreciative of all services provided thus far. CSW will perform a case closure on patient, as all goals of treatment have been met from social work standpoint and no additional social work needs have been identified at this time.  CSW will fax an update to patient's Primary Care Physician, Dr. Scarlette Calico to ensure that they are aware of CSW's involvement with patient's plan of care.  CSW will submit a case closure request to Josepha Pigg, Care Management Assistant with [facility] [place] [facility], in the form of an [employer].  Nat Christen, BSW, MSW, LCSW  Licensed Education officer, environmental Health System  [address]ailing [place] [address], [place], [place] [address] Physical Address-[address], [place], [place] [address] Toll  Free Main # [phone] Fax # [phone] Cell # [phone]  Office # [phone] [email or web address]

## 2016-05-25 DIAGNOSIS — I5032 Chronic diastolic (congestive) heart failure: Secondary | ICD-10-CM | POA: Diagnosis not present

## 2016-05-25 DIAGNOSIS — I110 Hypertensive heart disease with heart failure: Secondary | ICD-10-CM | POA: Diagnosis not present

## 2016-05-29 DIAGNOSIS — F331 Major depressive disorder, recurrent, moderate: Secondary | ICD-10-CM | POA: Diagnosis not present

## 2016-05-29 DIAGNOSIS — G3184 Mild cognitive impairment, so stated: Secondary | ICD-10-CM | POA: Diagnosis not present

## 2016-05-29 DIAGNOSIS — J45909 Unspecified asthma, uncomplicated: Secondary | ICD-10-CM | POA: Diagnosis not present

## 2016-05-29 DIAGNOSIS — I110 Hypertensive heart disease with heart failure: Secondary | ICD-10-CM | POA: Diagnosis not present

## 2016-05-29 DIAGNOSIS — I5032 Chronic diastolic (congestive) heart failure: Secondary | ICD-10-CM | POA: Diagnosis not present

## 2016-05-29 DIAGNOSIS — Z9181 History of falling: Secondary | ICD-10-CM | POA: Diagnosis not present

## 2016-05-29 DIAGNOSIS — I4891 Unspecified atrial fibrillation: Secondary | ICD-10-CM | POA: Diagnosis not present

## 2016-05-29 DIAGNOSIS — Z8701 Personal history of pneumonia (recurrent): Secondary | ICD-10-CM | POA: Diagnosis not present

## 2016-05-29 DIAGNOSIS — E039 Hypothyroidism, unspecified: Secondary | ICD-10-CM | POA: Diagnosis not present

## 2016-05-31 DIAGNOSIS — I5032 Chronic diastolic (congestive) heart failure: Secondary | ICD-10-CM | POA: Diagnosis not present

## 2016-05-31 DIAGNOSIS — I110 Hypertensive heart disease with heart failure: Secondary | ICD-10-CM | POA: Diagnosis not present

## 2016-06-02 ENCOUNTER — Telehealth: Payer: Self-pay | Admitting: Internal Medicine

## 2016-06-03 ENCOUNTER — Telehealth: Payer: Self-pay

## 2016-06-03 ENCOUNTER — Telehealth: Payer: Self-pay | Admitting: Internal Medicine

## 2016-06-03 NOTE — Telephone Encounter (Signed)
[facility] called to inform that the patient passed away. The nurse has no information on what time or anything else. She stated " I don't know it was last night sometime." It seemed she did not want to give me any information. She just wanted to talk to the nurse so she confirm Dr. Yetta BarreJones get the information. I informed her the nurse was unavailable. She would like the nurse to call her back as soon as possible.   Kriste BasqueBecky561 [phone]

## 2016-06-03 NOTE — Telephone Encounter (Signed)
Called [name] with [name]. [name] [name] wanted to verify that we received the information.   Called pt son [name] [name](Edward) and expressed condolences from myself and Dr. Yetta BarreJones.

## 2016-06-03 NOTE — Telephone Encounter (Signed)
On 06/19/2016 I received a [facility] (original). The death certificate is for cremation. The patient is a patient of Doctor Sanda Lingerhomas Jones. The death certificate will be taken to [facility] this am for signature.  On 06/06/16 I received the death certificate back from Doctor Yetta BarreJones. I got the death certificate ready and called the funeral home to let them know the death certificate is ready for pickup. I also faxed a copy to the funeral home per the funeral home request.

## 2016-06-03 NOTE — Telephone Encounter (Signed)
ok 

## 2016-06-03 NOTE — Telephone Encounter (Signed)
Forwarding to PCP as FYI.   I will call [facility] today.

## 2016-06-23 DIAGNOSIS — 419620001 Death: Secondary | SNOMED CT | POA: Diagnosis not present

## 2016-06-23 NOTE — Telephone Encounter (Signed)
Peter (EMT) called 06/22/2016 (approximately 1800) reported Ms Lozier-Arps found by son this pm  -  deceased.  Calling to notify and to have body released.  They will forward death certificate to PCP.

## 2016-06-23 DEATH — deceased

## 2016-10-31 ENCOUNTER — Ambulatory Visit: Payer: Self-pay | Admitting: Neurology

## 2017-10-30 IMAGING — DX DG CHEST 2V
2 series · 2 of 2 positions shown · non-contrast
Comparison: 01/20/2015

CLINICAL DATA: Cough, shortness of breath, fever

EXAM:
CHEST  2 VIEW

[chest pa]
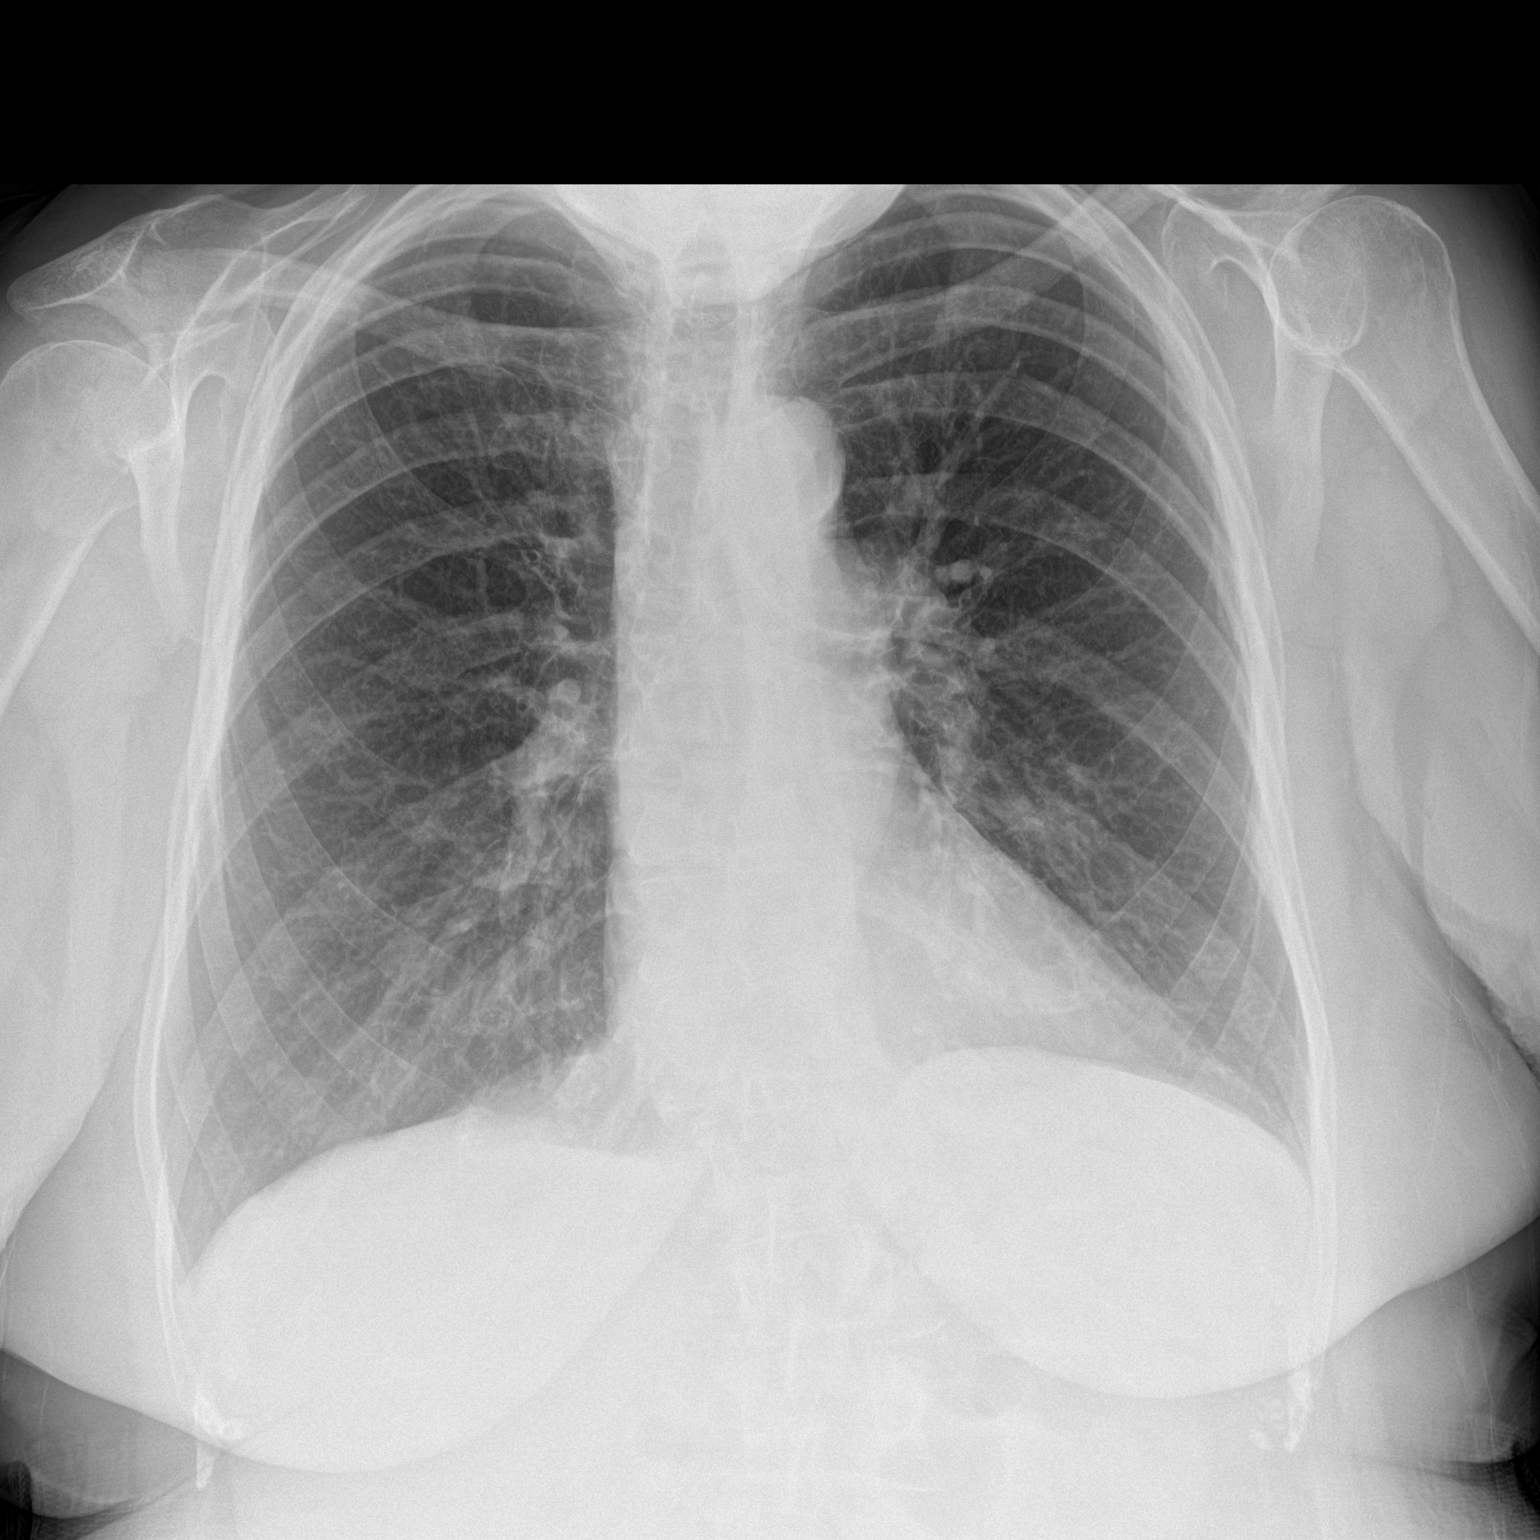

[chest lat]
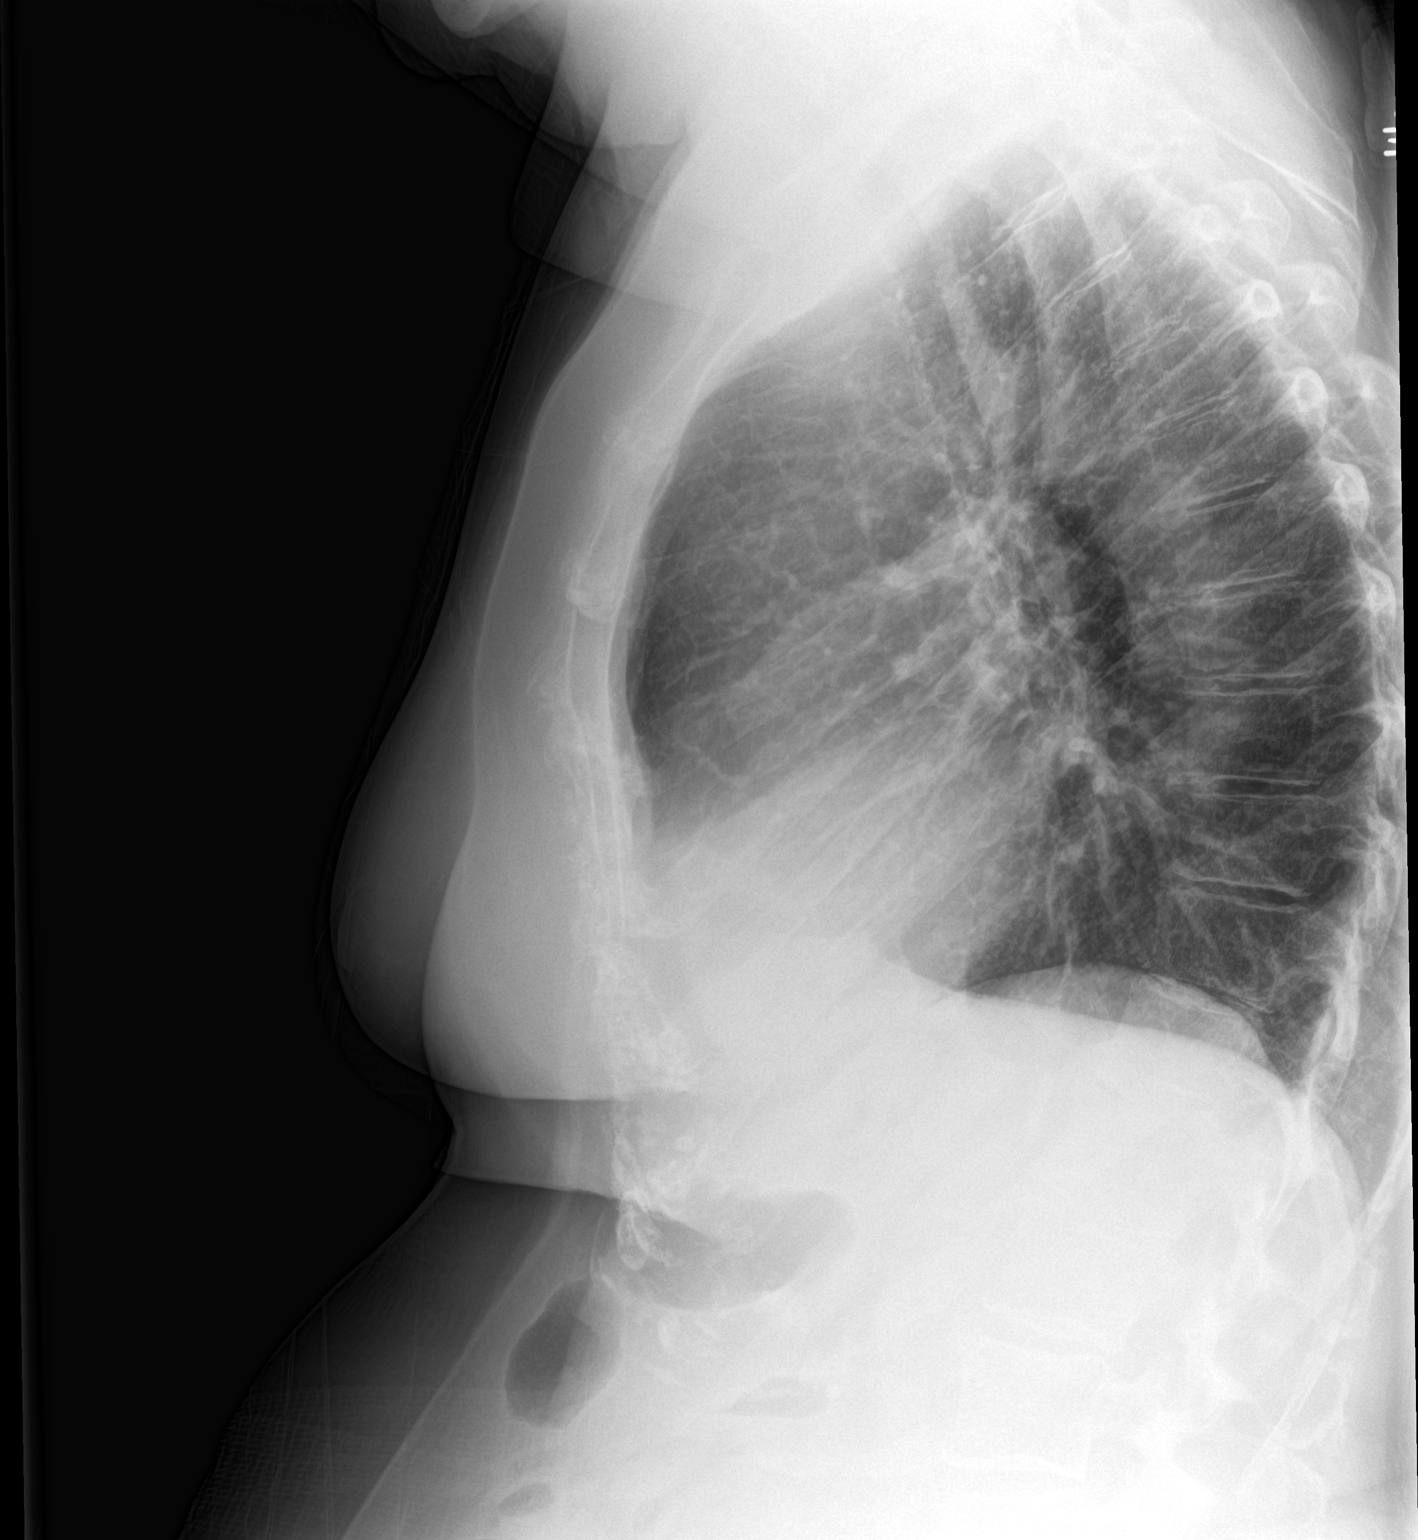

[2 of 2 positions shown; findings below may reference images not displayed]

FINDINGS: The heart size and mediastinal contours are within normal limits.
Both lungs are clear. The visualized skeletal structures are
unremarkable.
IMPRESSION: No active cardiopulmonary disease.
